# Patient Record
Sex: Female | Born: 1960 | Race: White | Hispanic: No | Smoking: Former smoker
Health system: Southern US, Community
[De-identification: ages and names within clinical notes are randomized; demographics above are authoritative.]

## PROBLEM LIST (undated history)

## (undated) DIAGNOSIS — F32A Depression, unspecified: Secondary | ICD-10-CM

## (undated) DIAGNOSIS — M810 Age-related osteoporosis without current pathological fracture: Secondary | ICD-10-CM

## (undated) DIAGNOSIS — E7800 Pure hypercholesterolemia, unspecified: Secondary | ICD-10-CM

## (undated) DIAGNOSIS — F329 Major depressive disorder, single episode, unspecified: Secondary | ICD-10-CM

## (undated) DIAGNOSIS — J449 Chronic obstructive pulmonary disease, unspecified: Secondary | ICD-10-CM

## (undated) DIAGNOSIS — J387 Other diseases of larynx: Secondary | ICD-10-CM

## (undated) DIAGNOSIS — J45909 Unspecified asthma, uncomplicated: Secondary | ICD-10-CM

## (undated) DIAGNOSIS — E079 Disorder of thyroid, unspecified: Secondary | ICD-10-CM

## (undated) HISTORY — DX: Depression, unspecified: F32.A

## (undated) HISTORY — PX: TUBAL LIGATION: SHX77

## (undated) HISTORY — DX: Other diseases of larynx: J38.7

## (undated) HISTORY — DX: Unspecified asthma, uncomplicated: J45.909

## (undated) HISTORY — DX: Age-related osteoporosis without current pathological fracture: M81.0

## (undated) HISTORY — DX: Chronic obstructive pulmonary disease, unspecified: J44.9

## (undated) HISTORY — DX: Major depressive disorder, single episode, unspecified: F32.9

---

## 2005-02-05 ENCOUNTER — Emergency Department: Payer: Self-pay | Admitting: Emergency Medicine

## 2005-09-26 ENCOUNTER — Emergency Department: Payer: Self-pay | Admitting: Emergency Medicine

## 2007-02-26 ENCOUNTER — Emergency Department: Payer: Self-pay | Admitting: Unknown Physician Specialty

## 2008-09-26 ENCOUNTER — Ambulatory Visit: Payer: Self-pay | Admitting: Urology

## 2011-06-15 ENCOUNTER — Emergency Department: Payer: Self-pay | Admitting: Emergency Medicine

## 2012-02-01 ENCOUNTER — Emergency Department: Payer: Self-pay | Admitting: Emergency Medicine

## 2012-02-01 LAB — CBC
HCT: 38.2 % (ref 35.0–47.0)
HGB: 13 g/dL (ref 12.0–16.0)
MCH: 32.9 pg (ref 26.0–34.0)
MCHC: 34 g/dL (ref 32.0–36.0)
MCV: 97 fL (ref 80–100)
Platelet: 241 10*3/uL (ref 150–440)
RBC: 3.94 10*6/uL (ref 3.80–5.20)
RDW: 13.5 % (ref 11.5–14.5)
WBC: 6.4 10*3/uL (ref 3.6–11.0)

## 2012-02-01 LAB — URIC ACID: Uric Acid: 2.9 mg/dL (ref 2.6–6.0)

## 2014-02-14 ENCOUNTER — Emergency Department: Payer: Self-pay | Admitting: Emergency Medicine

## 2014-02-14 LAB — COMPREHENSIVE METABOLIC PANEL
Albumin: 4.1 g/dL (ref 3.4–5.0)
Alkaline Phosphatase: 45 U/L
Anion Gap: 6 — ABNORMAL LOW (ref 7–16)
BUN: 16 mg/dL (ref 7–18)
Bilirubin,Total: 0.3 mg/dL (ref 0.2–1.0)
Calcium, Total: 8.7 mg/dL (ref 8.5–10.1)
Chloride: 107 mmol/L (ref 98–107)
Co2: 26 mmol/L (ref 21–32)
Creatinine: 1.15 mg/dL (ref 0.60–1.30)
EGFR (African American): >60
EGFR (Non-African Amer.): 55 — ABNORMAL LOW
Glucose: 79 mg/dL (ref 65–99)
Osmolality: 278 (ref 275–301)
Potassium: 4 mmol/L (ref 3.5–5.1)
SGOT(AST): 19 U/L (ref 15–37)
SGPT (ALT): 29 U/L (ref 12–78)
Sodium: 139 mmol/L (ref 136–145)
Total Protein: 7.6 g/dL (ref 6.4–8.2)

## 2014-02-14 LAB — CBC WITH DIFFERENTIAL/PLATELET
Basophil #: 0 10*3/uL (ref 0.0–0.1)
Basophil %: 0.6 %
Eosinophil #: 0.1 10*3/uL (ref 0.0–0.7)
Eosinophil %: 2.8 %
HCT: 38.9 % (ref 35.0–47.0)
HGB: 13.1 g/dL (ref 12.0–16.0)
Lymphocyte #: 0.9 10*3/uL — ABNORMAL LOW (ref 1.0–3.6)
Lymphocyte %: 21.6 %
MCH: 32.6 pg (ref 26.0–34.0)
MCHC: 33.7 g/dL (ref 32.0–36.0)
MCV: 97 fL (ref 80–100)
Monocyte #: 0.3 x10 3/mm (ref 0.2–0.9)
Monocyte %: 6.3 %
Neutrophil #: 2.9 10*3/uL (ref 1.4–6.5)
Neutrophil %: 68.7 %
Platelet: 227 10*3/uL (ref 150–440)
RBC: 4.03 10*6/uL (ref 3.80–5.20)
RDW: 13.2 % (ref 11.5–14.5)
WBC: 4.2 10*3/uL (ref 3.6–11.0)

## 2014-02-14 LAB — PROTIME-INR
INR: 1
Prothrombin Time: 13.4 secs (ref 11.5–14.7)

## 2014-02-14 LAB — APTT: Activated PTT: 28.9 secs (ref 23.6–35.9)

## 2014-04-29 ENCOUNTER — Emergency Department: Payer: Self-pay | Admitting: Emergency Medicine

## 2015-05-22 ENCOUNTER — Other Ambulatory Visit: Payer: Self-pay | Admitting: Unknown Physician Specialty

## 2015-05-22 DIAGNOSIS — Z1231 Encounter for screening mammogram for malignant neoplasm of breast: Secondary | ICD-10-CM

## 2015-06-11 ENCOUNTER — Ambulatory Visit (INDEPENDENT_AMBULATORY_CARE_PROVIDER_SITE_OTHER): Payer: BLUE CROSS/BLUE SHIELD | Admitting: Unknown Physician Specialty

## 2015-06-11 ENCOUNTER — Encounter: Payer: Self-pay | Admitting: Unknown Physician Specialty

## 2015-06-11 VITALS — BP 134/87 | HR 82 | Temp 98.6°F | Ht 66.5 in | Wt 156.0 lb

## 2015-06-11 DIAGNOSIS — G4700 Insomnia, unspecified: Secondary | ICD-10-CM

## 2015-06-11 DIAGNOSIS — F32A Depression, unspecified: Secondary | ICD-10-CM | POA: Insufficient documentation

## 2015-06-11 DIAGNOSIS — E063 Autoimmune thyroiditis: Secondary | ICD-10-CM | POA: Insufficient documentation

## 2015-06-11 DIAGNOSIS — E785 Hyperlipidemia, unspecified: Secondary | ICD-10-CM

## 2015-06-11 DIAGNOSIS — J4541 Moderate persistent asthma with (acute) exacerbation: Secondary | ICD-10-CM

## 2015-06-11 DIAGNOSIS — J449 Chronic obstructive pulmonary disease, unspecified: Secondary | ICD-10-CM | POA: Insufficient documentation

## 2015-06-11 DIAGNOSIS — M8580 Other specified disorders of bone density and structure, unspecified site: Secondary | ICD-10-CM

## 2015-06-11 DIAGNOSIS — F419 Anxiety disorder, unspecified: Secondary | ICD-10-CM

## 2015-06-11 DIAGNOSIS — F329 Major depressive disorder, single episode, unspecified: Secondary | ICD-10-CM | POA: Insufficient documentation

## 2015-06-11 DIAGNOSIS — J45909 Unspecified asthma, uncomplicated: Secondary | ICD-10-CM

## 2015-06-11 DIAGNOSIS — R05 Cough: Secondary | ICD-10-CM | POA: Diagnosis not present

## 2015-06-11 DIAGNOSIS — R059 Cough, unspecified: Secondary | ICD-10-CM

## 2015-06-11 DIAGNOSIS — M7070 Other bursitis of hip, unspecified hip: Secondary | ICD-10-CM

## 2015-06-11 DIAGNOSIS — E039 Hypothyroidism, unspecified: Secondary | ICD-10-CM

## 2015-06-11 DIAGNOSIS — E782 Mixed hyperlipidemia: Secondary | ICD-10-CM | POA: Insufficient documentation

## 2015-06-11 DIAGNOSIS — E7800 Pure hypercholesterolemia, unspecified: Secondary | ICD-10-CM

## 2015-06-11 DIAGNOSIS — G8929 Other chronic pain: Secondary | ICD-10-CM

## 2015-06-11 MED ORDER — LEVALBUTEROL HCL 0.63 MG/3ML IN NEBU: 0.6300 mg | INHALATION_SOLUTION | Freq: Once | RESPIRATORY_TRACT | Status: DC

## 2015-06-11 MED ORDER — BUDESONIDE-FORMOTEROL FUMARATE 160-4.5 MCG/ACT IN AERO: 2.0000 | INHALATION_SPRAY | Freq: Two times a day (BID) | RESPIRATORY_TRACT | Status: DC

## 2015-06-11 MED ORDER — HYDROCOD POLST-CPM POLST ER 10-8 MG/5ML PO SUER: 5.0000 mL | Freq: Two times a day (BID) | ORAL | Status: DC | PRN

## 2015-06-11 MED ORDER — PREDNISONE 20 MG PO TABS: ORAL_TABLET | ORAL | Status: DC

## 2015-06-11 MED ORDER — AZITHROMYCIN 250 MG PO TABS: ORAL_TABLET | ORAL | Status: DC

## 2015-06-11 NOTE — Patient Instructions (Signed)

## 2015-06-11 NOTE — Progress Notes (Signed)
BP 134/87 mmHg  Pulse 82  Temp(Src) 98.6 F (37 C)  Ht 5' 6.5" (1.689 m)  Wt 156 lb (70.761 kg)  BMI 24.80 kg/m2  SpO2 95%  LMP  (LMP Unknown)   Subjective:    Patient ID: Audrey Richard, female    DOB: [birth year], 54 y.o.   MRN: [id number]  HPI: Audrey Richard is a 54 y.o. female  Chief Complaint  Patient presents with  . Cough  . Chest Pain  . Headache    pt states it feels like her head is going to explode when she coughs  . Heartburn    pt states feels like her throat and esophagus is raw  . Dizziness   Cough This is a new problem. The current episode started in the past 7 days. The problem has been gradually worsening. The problem occurs constantly. The cough is non-productive. Associated symptoms include chest pain, headaches and heartburn. Nothing aggravates the symptoms. She has tried a beta-agonist inhaler for the symptoms. The treatment provided no relief. Her past medical history is significant for asthma.  Chest Pain  Associated symptoms include a cough, dizziness and headaches.  Headache  Associated symptoms include coughing and dizziness.  Heartburn She complains of chest pain, coughing and heartburn.  Dizziness Associated symptoms include chest pain, coughing and headaches.    Relevant past medical, surgical, family and social history reviewed and updated as indicated. Interim medical history since our last visit reviewed. Allergies and medications reviewed and updated.  Review of Systems  Respiratory: Positive for cough.   Cardiovascular: Positive for chest pain.  Gastrointestinal: Positive for heartburn.  Neurological: Positive for dizziness and headaches.    Per HPI unless specifically indicated above     Objective:    BP 134/87 mmHg  Pulse 82  Temp(Src) 98.6 F (37 C)  Ht 5' 6.5" (1.689 m)  Wt 156 lb (70.761 kg)  BMI 24.80 kg/m2  SpO2 95%  LMP  (LMP Unknown)  Wt Readings from Last 3 Encounters:  06/11/15 156 lb (70.761 kg)   05/18/15 155 lb (70.308 kg)    Physical Exam  Constitutional: She is oriented to person, place, and time. She appears well-developed and well-nourished. No distress.  HENT:  Head: Normocephalic and atraumatic.  Eyes: Conjunctivae and lids are normal. Right eye exhibits no discharge. Left eye exhibits no discharge. No scleral icterus.  Cardiovascular: Normal rate and regular rhythm.   Pulmonary/Chest: Effort normal. No respiratory distress. She has decreased breath sounds in the right lower field and the left lower field. She has rhonchi in the left lower field.  Abdominal: Normal appearance. There is no splenomegaly or hepatomegaly.  Musculoskeletal: Normal range of motion.  Neurological: She is alert and oriented to person, place, and time.  Skin: Skin is intact. No rash noted. No pallor.  Psychiatric: She has a normal mood and affect. Her behavior is normal. Judgment and thought content normal.     Assessment & Plan:   Problem List Items Addressed This Visit    None    Visit Diagnoses    Cough    -  Primary    Relevant Orders    Influenza A+B Ag, EIA    Asthma with acute exacerbation, moderate persistent        Lungs improved after nebulizer.      Relevant Medications    azithromycin (ZITHROMAX Z-PAK) 250 MG tablet    predniSONE (DELTASONE) 20 MG tablet    chlorpheniramine-HYDROcodone (TUSSIONEX PENNKINETIC ER)  10-8 MG/5ML SUER    budesonide-formoterol (SYMBICORT) 160-4.5 MCG/ACT inhaler         Follow up plan: Return if symptoms worsen or fail to improve.

## 2015-06-12 LAB — INFLUENZA A+B AG, EIA
Influenza A Ag, EIA: NEGATIVE
Influenza B Ag, EIA: NEGATIVE

## 2015-06-12 LAB — PLEASE NOTE:

## 2015-11-16 ENCOUNTER — Encounter: Payer: Self-pay | Admitting: Emergency Medicine

## 2015-11-16 ENCOUNTER — Emergency Department: Payer: BLUE CROSS/BLUE SHIELD

## 2015-11-16 ENCOUNTER — Emergency Department
Admission: EM | Admit: 2015-11-16 | Discharge: 2015-11-16 | Disposition: A | Discharge status: Home / Self Care | Payer: BLUE CROSS/BLUE SHIELD | Attending: Emergency Medicine | Admitting: Emergency Medicine

## 2015-11-16 DIAGNOSIS — Z79899 Other long term (current) drug therapy: Secondary | ICD-10-CM | POA: Insufficient documentation

## 2015-11-16 DIAGNOSIS — J45901 Unspecified asthma with (acute) exacerbation: Secondary | ICD-10-CM | POA: Diagnosis not present

## 2015-11-16 DIAGNOSIS — J40 Bronchitis, not specified as acute or chronic: Secondary | ICD-10-CM

## 2015-11-16 DIAGNOSIS — R05 Cough: Secondary | ICD-10-CM | POA: Diagnosis present

## 2015-11-16 DIAGNOSIS — Z7951 Long term (current) use of inhaled steroids: Secondary | ICD-10-CM | POA: Diagnosis not present

## 2015-11-16 DIAGNOSIS — Z87891 Personal history of nicotine dependence: Secondary | ICD-10-CM | POA: Diagnosis not present

## 2015-11-16 DIAGNOSIS — Z7952 Long term (current) use of systemic steroids: Secondary | ICD-10-CM | POA: Diagnosis not present

## 2015-11-16 HISTORY — DX: Disorder of thyroid, unspecified: E07.9

## 2015-11-16 HISTORY — DX: Pure hypercholesterolemia, unspecified: E78.00

## 2015-11-16 MED ORDER — ALBUTEROL SULFATE HFA 108 (90 BASE) MCG/ACT IN AERS: 2.0000 | INHALATION_SPRAY | Freq: Four times a day (QID) | RESPIRATORY_TRACT | Status: DC | PRN

## 2015-11-16 MED ORDER — IPRATROPIUM-ALBUTEROL 0.5-2.5 (3) MG/3ML IN SOLN
3.0000 mL | Freq: Once | RESPIRATORY_TRACT | Status: AC
  Administered 2015-11-16: 3 mL via RESPIRATORY_TRACT

## 2015-11-16 MED ORDER — AZITHROMYCIN 250 MG PO TABS: ORAL_TABLET | ORAL | Status: AC

## 2015-11-16 MED ORDER — DEXAMETHASONE SODIUM PHOSPHATE 10 MG/ML IJ SOLN
10.0000 mg | Freq: Once | INTRAMUSCULAR | Status: AC
  Administered 2015-11-16: 10 mg via INTRAMUSCULAR

## 2015-11-16 MED ORDER — IPRATROPIUM-ALBUTEROL 0.5-2.5 (3) MG/3ML IN SOLN
RESPIRATORY_TRACT | Status: AC
  Administered 2015-11-16: 3 mL via RESPIRATORY_TRACT
  Filled 2015-11-16: qty 3

## 2015-11-16 MED ORDER — DEXAMETHASONE SODIUM PHOSPHATE 10 MG/ML IJ SOLN
INTRAMUSCULAR | Status: AC
  Filled 2015-11-16: qty 1

## 2015-11-16 NOTE — ED Provider Notes (Signed)
[facility] Emergency Department Provider Note  ____________________________________________  Time seen: On arrival  I have reviewed the triage vital signs and the nursing notes.   HISTORY  Chief Complaint Cough and Nasal Congestion    HPI Audrey Richard is a 54 y.o. female with a history of asthma who presents with a cough for approximately one month. She reports her cough is so significant that she is having soreness in her ribs and difficult to sleeping. She denies recent travel, no fevers no chills. She does feel mildly short of breath. She is taking cough syrup but has not provided any relief. Cough is nonproductive. No pleurisy    Past Medical History  Diagnosis Date  . Osteoporosis   . Asthma   . Depression   . Thyroid disease   . High cholesterol     Patient Active Problem List   Diagnosis Date Noted  . Osteopenia 06/11/2015  . Depression 06/11/2015  . Insomnia 06/11/2015  . Hip bursitis 06/11/2015  . Chronic pain 06/11/2015  . Asthma 06/11/2015  . Hypothyroidism 06/11/2015  . Hyperlipidemia 06/11/2015  . Acute anxiety 06/11/2015  . Hypercholesterolemia 06/11/2015    Past Surgical History  Procedure Laterality Date  . Tubal ligation      Current Outpatient Rx  Name  Route  Sig  Dispense  Refill  . albuterol (PROVENTIL HFA;VENTOLIN HFA) 108 (90 BASE) MCG/ACT inhaler   Inhalation   Inhale into the lungs every 6 (six) hours as needed for wheezing or shortness of breath.         Marland Kitchen azithromycin (ZITHROMAX Z-PAK) 250 MG tablet      Z pack.  Take as directed   6 each   0   . budesonide-formoterol (SYMBICORT) 160-4.5 MCG/ACT inhaler   Inhalation   Inhale 2 puffs into the lungs 2 (two) times daily.   1 Inhaler   0   . buPROPion (WELLBUTRIN XL) 150 MG 24 hr tablet   Oral   Take 150 mg by mouth daily.         . chlorpheniramine-HYDROcodone (TUSSIONEX PENNKINETIC ER) 10-8 MG/5ML SUER   Oral   Take 5 mLs by mouth every  12 (twelve) hours as needed for cough.   140 mL   0   . FLUoxetine (PROZAC) 20 MG capsule   Oral   Take 20 mg by mouth daily.         . Fluticasone-Salmeterol (ADVAIR) 250-50 MCG/DOSE AEPB   Inhalation   Inhale 1 puff into the lungs 2 (two) times daily.         Marland Kitchen levothyroxine (SYNTHROID, LEVOTHROID) 100 MCG tablet   Oral   Take 100 mcg by mouth daily before breakfast.         . lovastatin (MEVACOR) 20 MG tablet   Oral   Take 20 mg by mouth at bedtime.         . montelukast (SINGULAIR) 10 MG tablet   Oral   Take 10 mg by mouth at bedtime.         . predniSONE (DELTASONE) 20 MG tablet      Take 2 pills daily with a meal   10 tablet   0   . traMADol (ULTRAM) 50 MG tablet   Oral   Take by mouth every 6 (six) hours as needed.         . traZODone (DESYREL) 50 MG tablet   Oral   Take 50 mg by mouth at bedtime.  Allergies Biaxin and Iodine  Family History  Problem Relation Age of Onset  . Heart disease Mother   . Heart disease Father   . Diabetes Sister   . Diabetes Maternal Grandmother   . Heart disease Maternal Grandmother   . Diabetes Maternal Grandfather   . Heart disease Maternal Grandfather     Social History Social History  Substance Use Topics  . Smoking status: Former Smoker    Quit date: 11/15/2005  . Smokeless tobacco: Never Used  . Alcohol Use: Yes     Comment: pt states on occasion    Review of Systems  Constitutional: Negative for fever. Eyes: Negative for blurry vision ENT: Negative for sore throat Respiratory: Positive for cough   Musculoskeletal: Negative for back pain. Skin: Negative for rash. Neurological: Negative for headaches   ____________________________________________   PHYSICAL EXAM:  VITAL SIGNS: ED Triage Vitals  Enc Vitals Group     BP 11/16/15 1742 135/71 mmHg     Pulse Rate 11/16/15 1742 74     Resp 11/16/15 1742 20     Temp 11/16/15 1742 98.4 F (36.9 C)     Temp Source 11/16/15  1742 Oral     SpO2 11/16/15 1742 95 %     Weight 11/16/15 1742 160 lb (72.576 kg)     Height 11/16/15 1742 5\' 7"  (1.702 m)     Head Cir --      Peak Flow --      Pain Score 11/16/15 1744 10     Pain Loc --      Pain Edu? --      Excl. in GC? --      Constitutional: Alert and oriented. Well appearing and in no distress. Eyes: Conjunctivae are normal.  ENT   Head: Normocephalic and atraumatic.   Mouth/Throat: Mucous membranes are moist. Cardiovascular: Normal rate, regular rhythm.  Respiratory: Normal respiratory effort without tachypnea nor retractions. Scattered wheezes noted Gastrointestinal: Soft and non-tender in all quadrants. No distention. There is no CVA tenderness. Musculoskeletal: Nontender with normal range of motion in all extremities. Neurologic:  Normal speech and language. No gross focal neurologic deficits are appreciated. Skin:  Skin is warm, dry and intact. No rash noted. Psychiatric: Mood and affect are normal. Patient exhibits appropriate insight and judgment.  ____________________________________________    LABS (pertinent positives/negatives)  Labs Reviewed - No data to display  ____________________________________________     ____________________________________________    RADIOLOGY I have personally reviewed any xrays that were ordered on this patient: Chest x-ray unremarkable  ____________________________________________   PROCEDURES  Procedure(s) performed: none   ____________________________________________   INITIAL IMPRESSION / ASSESSMENT AND PLAN / ED COURSE  Pertinent labs & imaging results that were available during my care of the patient were reviewed by me and considered in my medical decision making (see chart for details).  Patient with cough 1 week, afebrile, suspect bronchitis. We will give DuoNeb, check chest x-ray reevaluate  Patient feeling better after DuoNeb. X-ray unremarkable, we will discharge with  albuterol refill  Good Rx coupon provided ____________________________________________   FINAL CLINICAL IMPRESSION(S) / ED DIAGNOSES  Final diagnoses:  Bronchitis     Jene Everyobert Kamari Bilek, MD 11/16/15 1919

## 2015-11-16 NOTE — ED Notes (Signed)
Patient presents to the ED with cough and congestion x 1 month. Patient states her ribs are very sore from so much coughing.  Patient reports history of asthma and states the asthma is making her cough and congestion worse.  Patient has a dry cough during triage.  Patient is speaking in full sentences without difficulty at this time.  Patient reports taking cough syrup for her cough without much relief.

## 2015-11-16 NOTE — ED Notes (Signed)
Pt states she has been having cold sxs for the past month and now having pain in her ribs and in her lungs.  Pt states her cough is non-productive, but feels like something could come up.  Pt used to use Ventolin inhaler but does not have any more left. Has hx of Asthma, non-smoker.

## 2015-11-16 NOTE — Discharge Instructions (Signed)

## 2015-12-20 ENCOUNTER — Emergency Department: Payer: BLUE CROSS/BLUE SHIELD

## 2015-12-20 ENCOUNTER — Emergency Department
Admission: EM | Admit: 2015-12-20 | Discharge: 2015-12-20 | Disposition: A | Discharge status: Home / Self Care | Payer: BLUE CROSS/BLUE SHIELD | Attending: Emergency Medicine | Admitting: Emergency Medicine

## 2015-12-20 DIAGNOSIS — J410 Simple chronic bronchitis: Secondary | ICD-10-CM | POA: Diagnosis not present

## 2015-12-20 DIAGNOSIS — Z79899 Other long term (current) drug therapy: Secondary | ICD-10-CM | POA: Insufficient documentation

## 2015-12-20 DIAGNOSIS — Z7951 Long term (current) use of inhaled steroids: Secondary | ICD-10-CM | POA: Diagnosis not present

## 2015-12-20 DIAGNOSIS — J45909 Unspecified asthma, uncomplicated: Secondary | ICD-10-CM | POA: Insufficient documentation

## 2015-12-20 DIAGNOSIS — Z87891 Personal history of nicotine dependence: Secondary | ICD-10-CM | POA: Diagnosis not present

## 2015-12-20 DIAGNOSIS — R0781 Pleurodynia: Secondary | ICD-10-CM | POA: Diagnosis present

## 2015-12-20 DIAGNOSIS — Z7952 Long term (current) use of systemic steroids: Secondary | ICD-10-CM | POA: Diagnosis not present

## 2015-12-20 MED ORDER — HYDROCOD POLST-CPM POLST ER 10-8 MG/5ML PO SUER
ORAL | Status: AC
  Administered 2015-12-20: 5 mL via ORAL
  Filled 2015-12-20: qty 5

## 2015-12-20 MED ORDER — PREDNISONE 10 MG PO TABS: ORAL_TABLET | ORAL | Status: DC

## 2015-12-20 MED ORDER — HYDROCOD POLST-CPM POLST ER 10-8 MG/5ML PO SUER: 5.0000 mL | Freq: Two times a day (BID) | ORAL | Status: DC

## 2015-12-20 MED ORDER — HYDROCOD POLST-CPM POLST ER 10-8 MG/5ML PO SUER
5.0000 mL | Freq: Once | ORAL | Status: AC
Start: 2015-12-20 — End: 2015-12-20
  Administered 2015-12-20: 5 mL via ORAL

## 2015-12-20 NOTE — Discharge Instructions (Signed)
Call and make an appointment with Audrey Richard. Continue using her inhaler as needed. Tussionex 1 teaspoon twice a day as needed for cough. Prednisone 6 day taper beginning today. Tylenol if needed for rib pain. You're cough medication and has a narcotic in it so no other narcotic is needed at this time. Continue using your pillow against her ribs for support when taking deep breaths or coughing.

## 2015-12-20 NOTE — ED Notes (Signed)
States she felt a pop to right rib area after coughing this am .The patient has a history of of same in past

## 2015-12-20 NOTE — ED Notes (Signed)
Pt c/o right rib pain after coughing this morning. States she has broken a rib coughing before..Marland Kitchen

## 2015-12-20 NOTE — ED Provider Notes (Signed)
[facility] Emergency Department Provider Note ___________________________________________  Time seen: Approximately 7:35 AM  I have reviewed the triage vital signs and the nursing notes.   HISTORY  Chief Complaint Rib Injury  HPI Audrey Richard is a 54 y.o. female who comes to the emergency room with complaint of right lateral rib pain after coughing this morning. Patient states that in the past she has had a history of having a broken ribs after coughing and this feels similar. Patient states she was seen in the emergency room in November and placed on a Z-Pak and an inhaler. Patient states she has never gotten any better and continues to cough. She states that since her last visit to the emergency room she has not seen her PCP Charlean Sanfilippo for her continued medical problem. She denies any fever at home. She deniessmoking or ever been a smoker however her social history reflex that she was a former smoker and quit in 2006. Patient currently is being treated for asthma with inhaler and patient states she used it 5 minutes prior to her arrival in the emergency room. Currently she rates her pain a 10 out of 10.   Past Medical History  Diagnosis Date  . Osteoporosis   . Asthma   . Depression   . Thyroid disease   . High cholesterol     Patient Active Problem List   Diagnosis Date Noted  . Osteopenia 06/11/2015  . Depression 06/11/2015  . Insomnia 06/11/2015  . Hip bursitis 06/11/2015  . Chronic pain 06/11/2015  . Asthma 06/11/2015  . Hypothyroidism 06/11/2015  . Hyperlipidemia 06/11/2015  . Acute anxiety 06/11/2015  . Hypercholesterolemia 06/11/2015    Past Surgical History  Procedure Laterality Date  . Tubal ligation      Current Outpatient Rx  Name  Route  Sig  Dispense  Refill  . albuterol (PROVENTIL HFA;VENTOLIN HFA) 108 (90 BASE) MCG/ACT inhaler   Inhalation   Inhale 2 puffs into the lungs every 6 (six) hours as needed for wheezing or  shortness of breath.   1 Inhaler   2   . budesonide-formoterol (SYMBICORT) 160-4.5 MCG/ACT inhaler   Inhalation   Inhale 2 puffs into the lungs 2 (two) times daily.   1 Inhaler   0   . buPROPion (WELLBUTRIN XL) 150 MG 24 hr tablet   Oral   Take 150 mg by mouth daily.         . chlorpheniramine-HYDROcodone (TUSSIONEX PENNKINETIC ER) 10-8 MG/5ML SUER   Oral   Take 5 mLs by mouth 2 (two) times daily.   115 mL   0   . FLUoxetine (PROZAC) 20 MG capsule   Oral   Take 20 mg by mouth daily.         . Fluticasone-Salmeterol (ADVAIR) 250-50 MCG/DOSE AEPB   Inhalation   Inhale 1 puff into the lungs 2 (two) times daily.         Marland Kitchen levothyroxine (SYNTHROID, LEVOTHROID) 100 MCG tablet   Oral   Take 100 mcg by mouth daily before breakfast.         . lovastatin (MEVACOR) 20 MG tablet   Oral   Take 20 mg by mouth at bedtime.         . montelukast (SINGULAIR) 10 MG tablet   Oral   Take 10 mg by mouth at bedtime.         . predniSONE (DELTASONE) 10 MG tablet      Take 6 tablets  today, on day 2 take 5 tablets, day 3 take 4 tablets, day 4 take 3 tablets, day 5 take  2 tablets and 1 tablet the last day   21 tablet   0   . traMADol (ULTRAM) 50 MG tablet   Oral   Take by mouth every 6 (six) hours as needed.         . traZODone (DESYREL) 50 MG tablet   Oral   Take 50 mg by mouth at bedtime.           Allergies Biaxin and Iodine  Family History  Problem Relation Age of Onset  . Heart disease Mother   . Heart disease Father   . Diabetes Sister   . Diabetes Maternal Grandmother   . Heart disease Maternal Grandmother   . Diabetes Maternal Grandfather   . Heart disease Maternal Grandfather     Social History Social History  Substance Use Topics  . Smoking status: Former Smoker    Quit date: 11/15/2005  . Smokeless tobacco: Never Used  . Alcohol Use: Yes     Comment: pt states on occasion    Review of Systems Constitutional: No fever/chills ENT: No  sore throat. Cardiovascular: Denies chest pain. Respiratory: Denies shortness of breath. Increased right rib pain with coughing. Gastrointestinal:   No nausea, no vomiting.   Genitourinary: Negative for dysuria. Musculoskeletal: Negative for back pain. Skin: Negative for rash. Neurological: Negative for headaches, focal weakness or numbness.  10-point ROS otherwise negative.  ____________________________________________   PHYSICAL EXAM:  VITAL SIGNS: ED Triage Vitals  Enc Vitals Group     BP 12/20/15 0724 123/68 mmHg     Pulse Rate 12/20/15 0724 84     Resp 12/20/15 0724 20     Temp 12/20/15 0724 97.7 F (36.5 C)     Temp Source 12/20/15 0724 Oral     SpO2 12/20/15 0724 97 %     Weight 12/20/15 0724 150 lb (68.04 kg)     Height 12/20/15 0724  (1.702 m)     Head Cir --      Peak Flow --      Pain Score 12/20/15 0724 10     Pain Loc --      Pain Edu? --      Excl. in GC? --     Constitutional: Alert and oriented. Well appearing and in no acute distress. Eyes: Conjunctivae are normal. PERRL. EOMI. Head: Atraumatic. Nose: Moderate congestion/no rhinnorhea. Mouth/Throat: Mucous membranes are moist.  Oropharynx non-erythematous. Neck: No stridor. Supple  Cardiovascular: Normal rate, regular rhythm. Grossly normal heart sounds.  Good peripheral circulation. Respiratory: Poor respiratory effort secondary to pain.  No retractions. Lungs are clear however patient is taking very shallow breaths due to pain on the right lateral rib cage. No gross deformity was noted. There is no ecchymosis or abrasions present. Gastrointestinal: Soft and nontender. No distention.  Musculoskeletal: No lower extremity tenderness nor edema.  No joint effusions. Neurologic:  Normal speech and language. No gross focal neurologic deficits are appreciated. No gait instability. Skin:  Skin is warm, dry and intact. No rash noted. Psychiatric: Mood and affect are normal. Speech and behavior are  normal.  ____________________________________________   LABS (all labs ordered are listed, but only abnormal results are displayed)  Labs Reviewed - No data to display   RADIOLOGY  Right rib x-ray per radiologist showed no acute fracture. No acute cardiopulmonary abnormality seen. I, Tommi Rumps, personally viewed and evaluated  these images (plain radiographs) as part of my medical decision making, as well as reviewing the written report by the radiologist. ____________________________________________   PROCEDURES  Procedure(s) performed: None  Critical Care performed: No  ____________________________________________   INITIAL IMPRESSION / ASSESSMENT AND PLAN / ED COURSE  Pertinent labs & imaging results that were available during my care of the patient were reviewed by me and considered in my medical decision making (see chart for details).  Patient was given a prescription for Tussionex to reduce cough and help with rib pain. She is also given a prescription for prednisone 60 mg taper. She is to follow-up with her doctor if any continued problems. She will also continue using her inhaler as needed. We also discussed taking deeper breaths rather than shallow and holding a pillow against her ribs. ____________________________________________   FINAL CLINICAL IMPRESSION(S) / ED DIAGNOSES  Final diagnoses:  Rib pain on right side  Bronchitis, chronic, simple [facility](HCC)      Tommi RumpsRhonda L Carlon Chaloux, PA-C 12/20/15 1352  Sharman CheekPhillip Stafford, MD 12/20/15 1550

## 2015-12-20 NOTE — ED Notes (Signed)
PO meds given for cough ..states she also coughed up some blood this am

## 2015-12-28 ENCOUNTER — Encounter: Payer: Self-pay | Admitting: Unknown Physician Specialty

## 2015-12-28 ENCOUNTER — Ambulatory Visit (INDEPENDENT_AMBULATORY_CARE_PROVIDER_SITE_OTHER): Payer: BLUE CROSS/BLUE SHIELD | Admitting: Unknown Physician Specialty

## 2015-12-28 VITALS — BP 115/77 | HR 82 | Temp 98.0°F | Ht 66.1 in | Wt 159.6 lb

## 2015-12-28 DIAGNOSIS — R0789 Other chest pain: Secondary | ICD-10-CM

## 2015-12-28 NOTE — Progress Notes (Signed)
   BP 115/77 mmHg  Pulse 82  Temp(Src) 98 F (36.7 C)  Ht 5' 6.1" (1.679 m)  Wt 159 lb 9.6 oz (72.394 kg)  BMI 25.68 kg/m2  SpO2 96%  LMP  (LMP Unknown)   Subjective:    Patient ID: Audrey Richard, female    DOB: Feb 10, 1961, 55 y.o.   MRN: [id number]  HPI: Audrey Richard is a 55 y.o. female  Chief Complaint  Patient presents with  . Follow-up    pt was in ER for broken ribs, cartilage and has FMLA paperwork to be filled out    Right lateral rib pain due to cough. I reviewed the ER notes and there is no fracture noted. Right lateral rib pain due to cough.  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  Got Prednisone and Tussionex at the ER.    Relevant past medical, surgical, family and social history reviewed and updated as indicated. Interim medical history since our last visit reviewed. Allergies and medications reviewed and updated.  Review of Systems  Per HPI unless specifically indicated above     Objective:    BP 115/77 mmHg  Pulse 82  Temp(Src) 98 F (36.7 C)  Ht 5' 6.1" (1.679 m)  Wt 159 lb 9.6 oz (72.394 kg)  BMI 25.68 kg/m2  SpO2 96%  LMP  (LMP Unknown)  Wt Readings from Last 3 Encounters:  12/28/15 159 lb 9.6 oz (72.394 kg)  12/20/15 150 lb (68.04 kg)  11/16/15 160 lb (72.576 kg)    Physical Exam  Constitutional: She is oriented to person, place, and time. She appears well-developed and well-nourished. No distress.  In pain  HENT:  Head: Normocephalic and atraumatic.  Eyes: Conjunctivae and lids are normal. Right eye exhibits no discharge. Left eye exhibits no discharge. No scleral icterus.  Cardiovascular: Normal rate.   Pulmonary/Chest: Effort normal. She has no decreased breath sounds. She has no wheezes. She has no rhonchi. She has no rales. She exhibits tenderness.  Very TTP right lateral chest wall   Abdominal: Normal appearance. There is no splenomegaly or  hepatomegaly.  Musculoskeletal: Normal range of motion.  Neurological: She is alert and oriented to person, place, and time.  Skin: Skin is intact. No rash noted. No pallor.  Psychiatric: She has a normal mood and affect. Her behavior is normal. Judgment and thought content normal.    Results for orders placed or performed in visit on 06/11/15  Influenza A+B Ag, EIA  Result Value Ref Range   Influenza A Ag, EIA Negative Negative   Influenza B Ag, EIA Negative Negative   Influenza Comment See note   Please note:  Result Value Ref Range   Please note: Comment       Assessment & Plan:   Problem List Items Addressed This Visit    None    Visit Diagnoses    Chest wall pain    -  Primary    Lateral chest wall pain secondary to cough.  FMLA papers filled out to be out until the 17th.  Already taking Prednisone and Tussionex.  Try a heating pad.          Follow up plan: Return in about 1 week (around 01/04/2016), or if symptoms worsen or fail to improve.

## 2016-01-07 ENCOUNTER — Ambulatory Visit (INDEPENDENT_AMBULATORY_CARE_PROVIDER_SITE_OTHER): Payer: BLUE CROSS/BLUE SHIELD | Admitting: Unknown Physician Specialty

## 2016-01-07 ENCOUNTER — Encounter: Payer: Self-pay | Admitting: Unknown Physician Specialty

## 2016-01-07 VITALS — BP 117/81 | HR 81 | Temp 98.3°F | Ht 65.7 in | Wt 167.0 lb

## 2016-01-07 DIAGNOSIS — R0789 Other chest pain: Secondary | ICD-10-CM | POA: Diagnosis not present

## 2016-01-07 NOTE — Patient Instructions (Signed)
Use Lidoderm patches.

## 2016-01-07 NOTE — Progress Notes (Signed)
o  BP 117/81 mmHg  Pulse 81  Temp(Src) 98.3 F (36.8 C)  Ht 5' 5.7" (1.669 m)  Wt 167 lb (75.751 kg)  BMI 27.19 kg/m2  SpO2 96%  LMP  (LMP Unknown)   Subjective:    Patient ID: Audrey Richard, female    DOB: 07-18-1961, 55 y.o.   MRN: [id number]  HPI: Audrey Richard is a 55 y.o. female  Chief Complaint  Patient presents with  . Pain    pt states she is here for f/u on pain in rib area. States she is still in a lot of pain.    Pt states she is "a little better."  She is not near ready to go to work and do lifting as she was told she needed to be 100%.   She is not coughing anymore.  No fever or SOB  Relevant past medical, surgical, family and social history reviewed and updated as indicated. Interim medical history since our last visit reviewed. Allergies and medications reviewed and updated.  Review of Systems  Per HPI unless specifically indicated above     Objective:    BP 117/81 mmHg  Pulse 81  Temp(Src) 98.3 F (36.8 C)  Ht 5' 5.7" (1.669 m)  Wt 167 lb (75.751 kg)  BMI 27.19 kg/m2  SpO2 96%  LMP  (LMP Unknown)  Wt Readings from Last 3 Encounters:  01/07/16 167 lb (75.751 kg)  12/28/15 159 lb 9.6 oz (72.394 kg)  12/20/15 150 lb (68.04 kg)    Physical Exam  Constitutional: She is oriented to person, place, and time. She appears well-developed and well-nourished. No distress.  HENT:  Head: Normocephalic and atraumatic.  Eyes: Conjunctivae and lids are normal. Right eye exhibits no discharge. Left eye exhibits no discharge. No scleral icterus.  Neck: Normal range of motion. Neck supple. No JVD present. Carotid bruit is not present.  Cardiovascular: Normal rate, regular rhythm and normal heart sounds.   Pulmonary/Chest: Effort normal. She has rales in the right lower field. She exhibits tenderness.  Tender right lateral rib cage.    Abdominal: Normal appearance. There is no splenomegaly or hepatomegaly.  Musculoskeletal: Normal range of motion.   Neurological: She is alert and oriented to person, place, and time.  Skin: Skin is warm, dry and intact. No rash noted. No pallor.  Psychiatric: She has a normal mood and affect. Her behavior is normal. Judgment and thought content normal.    Results for orders placed or performed in visit on 06/11/15  Influenza A+B Ag, EIA  Result Value Ref Range   Influenza A Ag, EIA Negative Negative   Influenza B Ag, EIA Negative Negative   Influenza Comment See note   Please note:  Result Value Ref Range   Please note: Comment       Assessment & Plan:   Problem List Items Addressed This Visit    None    Visit Diagnoses    Chest wall pain    -  Primary    continued chest wall pain.  ? crackles RLL.  Will get f/u chest x-ray.  Out of work for another week    Relevant Orders    DG Chest 2 View        Follow up plan: Return in about 1 week (around 01/14/2016).

## 2016-01-08 ENCOUNTER — Ambulatory Visit
Admission: RE | Admit: 2016-01-08 | Discharge: 2016-01-08 | Disposition: A | Discharge status: Home / Self Care | Payer: BLUE CROSS/BLUE SHIELD | Source: Ambulatory Visit | Attending: Unknown Physician Specialty | Admitting: Unknown Physician Specialty

## 2016-01-08 DIAGNOSIS — S2231XA Fracture of one rib, right side, initial encounter for closed fracture: Secondary | ICD-10-CM | POA: Diagnosis not present

## 2016-01-08 DIAGNOSIS — R0789 Other chest pain: Secondary | ICD-10-CM | POA: Diagnosis present

## 2016-01-08 DIAGNOSIS — X58XXXA Exposure to other specified factors, initial encounter: Secondary | ICD-10-CM | POA: Insufficient documentation

## 2016-01-10 ENCOUNTER — Encounter: Payer: Self-pay | Admitting: Unknown Physician Specialty

## 2016-01-14 ENCOUNTER — Ambulatory Visit (INDEPENDENT_AMBULATORY_CARE_PROVIDER_SITE_OTHER): Payer: BLUE CROSS/BLUE SHIELD | Admitting: Unknown Physician Specialty

## 2016-01-14 ENCOUNTER — Encounter: Payer: Self-pay | Admitting: Unknown Physician Specialty

## 2016-01-14 VITALS — BP 112/75 | HR 85 | Temp 98.4°F | Ht 65.7 in | Wt 164.6 lb

## 2016-01-14 DIAGNOSIS — E7800 Pure hypercholesterolemia, unspecified: Secondary | ICD-10-CM

## 2016-01-14 DIAGNOSIS — S2231XD Fracture of one rib, right side, subsequent encounter for fracture with routine healing: Secondary | ICD-10-CM | POA: Diagnosis not present

## 2016-01-14 DIAGNOSIS — E039 Hypothyroidism, unspecified: Secondary | ICD-10-CM

## 2016-01-14 MED ORDER — LOVASTATIN 20 MG PO TABS: 20.0000 mg | ORAL_TABLET | Freq: Every day | ORAL | Status: DC

## 2016-01-14 MED ORDER — BUPROPION HCL ER (XL) 150 MG PO TB24: 150.0000 mg | ORAL_TABLET | Freq: Every day | ORAL | Status: DC

## 2016-01-14 MED ORDER — LEVOTHYROXINE SODIUM 100 MCG PO TABS: 100.0000 ug | ORAL_TABLET | Freq: Every day | ORAL | Status: DC

## 2016-01-14 MED ORDER — FLUOXETINE HCL 20 MG PO CAPS: 20.0000 mg | ORAL_CAPSULE | Freq: Every day | ORAL | Status: DC

## 2016-01-14 NOTE — Progress Notes (Signed)
BP 112/75 mmHg  Pulse 85  Temp(Src) 98.4 F (36.9 C)  Ht 5' 5.7" (1.669 m)  Wt 164 lb 9.6 oz (74.662 kg)  BMI 26.80 kg/m2  SpO2 95%  LMP  (LMP Unknown)   Subjective:    Patient ID: Audrey Richard, female    DOB: [birth year], 55 y.o.   MRN: [id number]  HPI: Audrey Richard is a 55 y.o. female  Chief Complaint  Patient presents with  . Pain    pt is here for f/u on pain in rib cage area. states pain is a little better  . Medication Refill    pt states she needs refills on all medications   Hypothyroid: Feels this is stable.  No unexplained fatigue or weight loss.    Depression:Feels this is stable and no change Depression screen [facility] 2/9 01/14/2016  Decreased Interest 2  Down, Depressed, Hopeless 1  PHQ - 2 Score 3  Altered sleeping 0  Tired, decreased energy 2  Change in appetite 0  Feeling bad or failure about yourself  1  Trouble concentrating 0  Moving slowly or fidgety/restless 0  Suicidal thoughts 0  PHQ-9 Score 6    Hyperlipidemia Using medications without problems No Muscle aches  Diet compliance: Exercise: none  Rib pain Last x-rays reviewed showed fractured rib at the area of pain.  Still unable to work as she cannot lift.     Relevant past medical, surgical, family and social history reviewed and updated as indicated. Interim medical history since our last visit reviewed. Allergies and medications reviewed and updated.  Review of Systems  Per HPI unless specifically indicated above     Objective:    BP 112/75 mmHg  Pulse 85  Temp(Src) 98.4 F (36.9 C)  Ht 5' 5.7" (1.669 m)  Wt 164 lb 9.6 oz (74.662 kg)  BMI 26.80 kg/m2  SpO2 95%  LMP  (LMP Unknown)  Wt Readings from Last 3 Encounters:  01/14/16 164 lb 9.6 oz (74.662 kg)  01/07/16 167 lb (75.751 kg)  12/28/15 159 lb 9.6 oz (72.394 kg)    Physical Exam  Constitutional: She is oriented to person, place, and time. She appears well-developed and well-nourished. No distress.  HENT:   Head: Normocephalic and atraumatic.  Eyes: Conjunctivae and lids are normal. Right eye exhibits no discharge. Left eye exhibits no discharge. No scleral icterus.  Cardiovascular: Normal rate and regular rhythm.   Pulmonary/Chest: Effort normal and breath sounds normal.  Abdominal: Normal appearance. There is no splenomegaly or hepatomegaly.  Musculoskeletal: Normal range of motion.  Neurological: She is alert and oriented to person, place, and time.  Skin: Skin is warm, dry and intact. No rash noted. No pallor.  Psychiatric: She has a normal mood and affect. Her behavior is normal. Judgment and thought content normal.       Assessment & Plan:   Problem List Items Addressed This Visit      Unprioritized   Hypothyroidism    Check TSH      Relevant Orders   TSH   Hypercholesterolemia    Check lipid panel.  Refill Mevacor      Relevant Orders   Comprehensive metabolic panel   Lipid Panel w/o Chol/HDL Ratio    Other Visit Diagnoses    Rib fracture, right, with routine healing, subsequent encounter    -  Primary    Improved pain.  Willl continue to monitor.  Out of work for 2 weeks  Follow up plan: Return in about 2 weeks (around 01/28/2016).

## 2016-01-14 NOTE — Assessment & Plan Note (Signed)
Check lipid panel.  Refill Mevacor

## 2016-01-14 NOTE — Assessment & Plan Note (Signed)
Check TSH 

## 2016-01-15 LAB — LIPID PANEL W/O CHOL/HDL RATIO
Cholesterol, Total: 209 mg/dL — ABNORMAL HIGH (ref 100–199)
HDL: 54 mg/dL (ref >39)
LDL Calculated: 130 mg/dL — ABNORMAL HIGH (ref 0–99)
Triglycerides: 126 mg/dL (ref 0–149)
VLDL Cholesterol Cal: 25 mg/dL (ref 5–40)

## 2016-01-15 LAB — COMPREHENSIVE METABOLIC PANEL WITH GFR
ALT: 82 IU/L — ABNORMAL HIGH (ref 0–32)
AST: 30 IU/L (ref 0–40)
Albumin/Globulin Ratio: 1.8 (ref 1.1–2.5)
Albumin: 4 g/dL (ref 3.5–5.5)
Alkaline Phosphatase: 64 IU/L (ref 39–117)
BUN/Creatinine Ratio: 15 (ref 9–23)
BUN: 15 mg/dL (ref 6–24)
Bilirubin Total: 0.5 mg/dL (ref 0.0–1.2)
CO2: 25 mmol/L (ref 18–29)
Calcium: 8.8 mg/dL (ref 8.7–10.2)
Chloride: 103 mmol/L (ref 96–106)
Creatinine, Ser: 0.98 mg/dL (ref 0.57–1.00)
GFR calc Af Amer: 76 mL/min/1.73
GFR calc non Af Amer: 66 mL/min/1.73
Globulin, Total: 2.2 g/dL (ref 1.5–4.5)
Glucose: 73 mg/dL (ref 65–99)
Potassium: 4 mmol/L (ref 3.5–5.2)
Sodium: 142 mmol/L (ref 134–144)
Total Protein: 6.2 g/dL (ref 6.0–8.5)

## 2016-01-15 LAB — TSH: TSH: 5.14 u[IU]/mL — ABNORMAL HIGH (ref 0.450–4.500)

## 2016-01-17 ENCOUNTER — Telehealth: Payer: Self-pay | Admitting: Unknown Physician Specialty

## 2016-01-17 DIAGNOSIS — E039 Hypothyroidism, unspecified: Secondary | ICD-10-CM

## 2016-01-17 DIAGNOSIS — R7401 Elevation of levels of liver transaminase levels: Secondary | ICD-10-CM

## 2016-01-17 DIAGNOSIS — R740 Nonspecific elevation of levels of transaminase and lactic acid dehydrogenase [LDH]: Secondary | ICD-10-CM

## 2016-01-17 MED ORDER — LEVOTHYROXINE SODIUM 112 MCG PO TABS: 112.0000 ug | ORAL_TABLET | Freq: Every day | ORAL | Status: DC

## 2016-01-17 NOTE — Telephone Encounter (Signed)
Discussed with pt and talked about Elevated liver enzymes, high cholesterol and higher than normal TSH.  Will increase Synthroid from 100 to 112 mcgs.  Recheck TSH and CMP in 3 months

## 2016-01-28 ENCOUNTER — Encounter: Payer: Self-pay | Admitting: Unknown Physician Specialty

## 2016-01-28 ENCOUNTER — Ambulatory Visit (INDEPENDENT_AMBULATORY_CARE_PROVIDER_SITE_OTHER): Payer: BLUE CROSS/BLUE SHIELD | Admitting: Unknown Physician Specialty

## 2016-01-28 VITALS — BP 109/72 | HR 83 | Temp 98.1°F | Ht 65.8 in | Wt 169.4 lb

## 2016-01-28 DIAGNOSIS — J0100 Acute maxillary sinusitis, unspecified: Secondary | ICD-10-CM | POA: Diagnosis not present

## 2016-01-28 DIAGNOSIS — S2231XD Fracture of one rib, right side, subsequent encounter for fracture with routine healing: Secondary | ICD-10-CM

## 2016-01-28 MED ORDER — GUAIFENESIN-CODEINE 100-10 MG/5ML PO SOLN: 10.0000 mL | Freq: Three times a day (TID) | ORAL | Status: DC | PRN

## 2016-01-28 MED ORDER — AMOXICILLIN 875 MG PO TABS: 875.0000 mg | ORAL_TABLET | Freq: Two times a day (BID) | ORAL | Status: DC

## 2016-01-28 NOTE — Progress Notes (Signed)
BP 109/72 mmHg  Pulse 83  Temp(Src) 98.1 F (36.7 C)  Ht 5' 5.8" (1.671 m)  Wt 169 lb 6.4 oz (76.839 kg)  BMI 27.52 kg/m2  SpO2 95%  LMP  (LMP Unknown)   Subjective:    Patient ID: Audrey Richard, female    DOB: 08-19-1961, 55 y.o.   MRN: [id number]  HPI: Audrey Richard is a 55 y.o. female  Chief Complaint  Patient presents with  . Pain    pt states she is following up on pain, states she is feeling better  . URI    pt states she has some sinus pressure, drainage, cough and congestion. States symptoms started about a week ago  Pain/rib fracture: Patient has some soreness, but rib pain is much better. Worried that pain will worsen with current coughing. She is better but probably not able to go back to work as she needs to pick up heavy boxes.    URI: Headache, congestion, drainage in throat, cough, rhinorrhea and bloody kleenex when she blows nose. All symptoms started last week. Has taken Robitussin OTC which helped cough only. Hasn't been around any sick contacts.   Relevant past medical, surgical, family and social history reviewed and updated as indicated. Interim medical history since our last visit reviewed. Allergies and medications reviewed and updated.  Review of Systems  Constitutional: Negative.   HENT: Positive for congestion, rhinorrhea and sinus pressure.   Eyes: Negative.   Respiratory: Positive for cough.   Cardiovascular: Negative.   Gastrointestinal: Negative.   Endocrine: Negative.   Genitourinary: Negative.   Musculoskeletal: Negative.   Skin: Negative.   Allergic/Immunologic: Negative.   Neurological: Negative.   Hematological: Negative.   Psychiatric/Behavioral: Negative.     Per HPI unless specifically indicated above     Objective:    BP 109/72 mmHg  Pulse 83  Temp(Src) 98.1 F (36.7 C)  Ht 5' 5.8" (1.671 m)  Wt 169 lb 6.4 oz (76.839 kg)  BMI 27.52 kg/m2  SpO2 95%  LMP  (LMP Unknown)  Wt Readings from Last 3 Encounters:   01/28/16 169 lb 6.4 oz (76.839 kg)  01/14/16 164 lb 9.6 oz (74.662 kg)  01/07/16 167 lb (75.751 kg)    Physical Exam  Constitutional: She is oriented to person, place, and time. She appears well-developed and well-nourished. No distress.  HENT:  Head: Normocephalic and atraumatic.  Right Ear: Tympanic membrane and ear canal normal.  Left Ear: Tympanic membrane and ear canal normal.  Nose: Rhinorrhea present. Right sinus exhibits no maxillary sinus tenderness and no frontal sinus tenderness. Left sinus exhibits no maxillary sinus tenderness and no frontal sinus tenderness.  Mouth/Throat: Mucous membranes are normal. Posterior oropharyngeal erythema present.  Eyes: Conjunctivae and lids are normal. Right eye exhibits no discharge. Left eye exhibits no discharge. No scleral icterus.  Cardiovascular: Normal rate and regular rhythm.   Pulmonary/Chest: Effort normal and breath sounds normal. No respiratory distress.  Abdominal: Normal appearance. There is no splenomegaly or hepatomegaly.  Musculoskeletal: Normal range of motion.  Neurological: She is alert and oriented to person, place, and time.  Skin: Skin is intact. No rash noted. No pallor.  Psychiatric: She has a normal mood and affect. Her behavior is normal. Judgment and thought content normal.    Results for orders placed or performed in visit on 01/14/16  Comprehensive metabolic panel  Result Value Ref Range   Glucose 73 65 - 99 mg/dL   BUN 15 6 - 24 mg/dL  Creatinine, Ser 0.98 0.57 - 1.00 mg/dL   GFR calc non Af Amer 66 >59 mL/min/1.73   GFR calc Af Amer 76 >59 mL/min/1.73   BUN/Creatinine Ratio 15 9 - 23   Sodium 142 134 - 144 mmol/L   Potassium 4.0 3.5 - 5.2 mmol/L   Chloride 103 96 - 106 mmol/L   CO2 25 18 - 29 mmol/L   Calcium 8.8 8.7 - 10.2 mg/dL   Total Protein 6.2 6.0 - 8.5 g/dL   Albumin 4.0 3.5 - 5.5 g/dL   Globulin, Total 2.2 1.5 - 4.5 g/dL   Albumin/Globulin Ratio 1.8 1.1 - 2.5   Bilirubin Total 0.5 0.0 - 1.2  mg/dL   Alkaline Phosphatase 64 39 - 117 IU/L   AST 30 0 - 40 IU/L   ALT 82 (H) 0 - 32 IU/L  TSH  Result Value Ref Range   TSH 5.140 (H) 0.450 - 4.500 uIU/mL  Lipid Panel w/o Chol/HDL Ratio  Result Value Ref Range   Cholesterol, Total 209 (H) 100 - 199 mg/dL   Triglycerides 956 0 - 149 mg/dL   HDL 54 >21 mg/dL   VLDL Cholesterol Cal 25 5 - 40 mg/dL   LDL Calculated 308 (H) 0 - 99 mg/dL      Assessment & Plan:   Problem List Items Addressed This Visit    None    Visit Diagnoses    Acute maxillary sinusitis, recurrence not specified    -  Primary    Relevant Medications    amoxicillin (AMOXIL) 875 MG tablet    guaiFENesin-codeine 100-10 MG/5ML syrup    Rib fracture, right, with routine healing, subsequent encounter            Follow up plan: Return in about 2 weeks (around 02/11/2016).

## 2016-02-11 ENCOUNTER — Encounter: Payer: Self-pay | Admitting: Unknown Physician Specialty

## 2016-02-11 ENCOUNTER — Ambulatory Visit (INDEPENDENT_AMBULATORY_CARE_PROVIDER_SITE_OTHER): Payer: BLUE CROSS/BLUE SHIELD | Admitting: Unknown Physician Specialty

## 2016-02-11 VITALS — BP 145/79 | HR 92 | Temp 98.6°F | Ht 65.3 in | Wt 173.2 lb

## 2016-02-11 DIAGNOSIS — R0789 Other chest pain: Secondary | ICD-10-CM | POA: Diagnosis not present

## 2016-02-11 DIAGNOSIS — J301 Allergic rhinitis due to pollen: Secondary | ICD-10-CM | POA: Diagnosis not present

## 2016-02-11 DIAGNOSIS — H1013 Acute atopic conjunctivitis, bilateral: Secondary | ICD-10-CM | POA: Diagnosis not present

## 2016-02-11 MED ORDER — FLUTICASONE PROPIONATE 50 MCG/ACT NA SUSP: 2.0000 | Freq: Every day | NASAL | Status: DC

## 2016-02-11 MED ORDER — AZELASTINE HCL 0.05 % OP SOLN: 1.0000 [drp] | Freq: Two times a day (BID) | OPHTHALMIC | Status: DC

## 2016-02-11 NOTE — Progress Notes (Signed)
BP 145/79 mmHg  Pulse 92  Temp(Src) 98.6 F (37 C)  Ht 5' 5.3" (1.659 m)  Wt 173 lb 3.2 oz (78.563 kg)  BMI 28.54 kg/m2  SpO2 96%  LMP  (LMP Unknown)   Subjective:    Patient ID: Audrey Richard, female    DOB: 1961/11/11, 55 y.o.   MRN: [id number]  HPI: Audrey Richard is a 55 y.o. female  Chief Complaint  Patient presents with  . Pain    pt states she is here for 2 week f/u and states pain is getting better  . Itchy Eye    pt states both eyes are very itchy   Chest wall pain Pt is here to f/u on her chest pain secondary to rib fracture.  Pt states she is better and will be able to go to work in 2 weeks.    Allergies Nasal congestion and itchy eyes.  She is taking Loratadine which isn't helping.  Using Clear eyes for Allergies with only mild improvement.    Relevant past medical, surgical, family and social history reviewed and updated as indicated. Interim medical history since our last visit reviewed. Allergies and medications reviewed and updated.  Review of Systems  Per HPI unless specifically indicated above     Objective:    BP 145/79 mmHg  Pulse 92  Temp(Src) 98.6 F (37 C)  Ht 5' 5.3" (1.659 m)  Wt 173 lb 3.2 oz (78.563 kg)  BMI 28.54 kg/m2  SpO2 96%  LMP  (LMP Unknown)  Wt Readings from Last 3 Encounters:  02/11/16 173 lb 3.2 oz (78.563 kg)  01/28/16 169 lb 6.4 oz (76.839 kg)  01/14/16 164 lb 9.6 oz (74.662 kg)    Physical Exam  Constitutional: She is oriented to person, place, and time. She appears well-developed and well-nourished. No distress.  HENT:  Head: Normocephalic and atraumatic.  Right Ear: Tympanic membrane and ear canal normal.  Left Ear: Tympanic membrane and ear canal normal.  Nose: Rhinorrhea present. Right sinus exhibits no maxillary sinus tenderness and no frontal sinus tenderness. Left sinus exhibits no maxillary sinus tenderness and no frontal sinus tenderness.  Mouth/Throat: Mucous membranes are normal. Posterior  oropharyngeal erythema present.  Eyes: Conjunctivae and lids are normal. Right eye exhibits no discharge. Left eye exhibits no discharge. No scleral icterus.  Cardiovascular: Normal rate and regular rhythm.   Pulmonary/Chest: Effort normal and breath sounds normal. No respiratory distress.  Abdominal: Normal appearance. There is no splenomegaly or hepatomegaly.  Musculoskeletal: Normal range of motion.  Neurological: She is alert and oriented to person, place, and time.  Skin: Skin is intact. No rash noted. No pallor.  Psychiatric: She has a normal mood and affect. Her behavior is normal. Judgment and thought content normal.    Results for orders placed or performed in visit on 01/14/16  Comprehensive metabolic panel  Result Value Ref Range   Glucose 73 65 - 99 mg/dL   BUN 15 6 - 24 mg/dL   Creatinine, Ser 4.09 0.57 - 1.00 mg/dL   GFR calc non Af Amer 66 >59 mL/min/1.73   GFR calc Af Amer 76 >59 mL/min/1.73   BUN/Creatinine Ratio 15 9 - 23   Sodium 142 134 - 144 mmol/L   Potassium 4.0 3.5 - 5.2 mmol/L   Chloride 103 96 - 106 mmol/L   CO2 25 18 - 29 mmol/L   Calcium 8.8 8.7 - 10.2 mg/dL   Total Protein 6.2 6.0 - 8.5 g/dL   Albumin  4.0 3.5 - 5.5 g/dL   Globulin, Total 2.2 1.5 - 4.5 g/dL   Albumin/Globulin Ratio 1.8 1.1 - 2.5   Bilirubin Total 0.5 0.0 - 1.2 mg/dL   Alkaline Phosphatase 64 39 - 117 IU/L   AST 30 0 - 40 IU/L   ALT 82 (H) 0 - 32 IU/L  TSH  Result Value Ref Range   TSH 5.140 (H) 0.450 - 4.500 uIU/mL  Lipid Panel w/o Chol/HDL Ratio  Result Value Ref Range   Cholesterol, Total 209 (H) 100 - 199 mg/dL   Triglycerides 161 0 - 149 mg/dL   HDL 54 >09 mg/dL   VLDL Cholesterol Cal 25 5 - 40 mg/dL   LDL Calculated 604 (H) 0 - 99 mg/dL      Assessment & Plan:   Problem List Items Addressed This Visit    None    Visit Diagnoses    Allergic rhinitis due to pollen    -  Primary    rx for Flonase    Allergic conjunctivitis, bilateral        Rx for Optivar     Chest  wall pain        Due to rib fracture.  Improving.  Back to work in 2 weeks        Follow up plan: Return if symptoms worsen or fail to improve, for recheck in July.

## 2016-02-25 ENCOUNTER — Telehealth: Payer: Self-pay | Admitting: Unknown Physician Specialty

## 2016-02-25 ENCOUNTER — Encounter: Payer: Self-pay | Admitting: Unknown Physician Specialty

## 2016-02-25 NOTE — Telephone Encounter (Signed)
Routing to provider  

## 2016-02-25 NOTE — Telephone Encounter (Signed)
I will handle it. Thanks Elnita MaxwellCheryl!

## 2016-02-25 NOTE — Telephone Encounter (Signed)
Yes it can.  Are you comfortable writing the note?

## 2016-02-25 NOTE — Telephone Encounter (Signed)
Pt came in thought she had an appt for return to work clearance, researched found note stated pt could return to work today pt thought she was supposed to return tomorrow, wants to know if note can be extended through tomorrow so she doesn't lose her job. Thanks.

## 2016-05-14 ENCOUNTER — Other Ambulatory Visit: Payer: BLUE CROSS/BLUE SHIELD

## 2016-05-15 ENCOUNTER — Telehealth: Payer: Self-pay

## 2016-05-15 ENCOUNTER — Other Ambulatory Visit: Payer: BLUE CROSS/BLUE SHIELD

## 2016-05-15 DIAGNOSIS — R740 Nonspecific elevation of levels of transaminase and lactic acid dehydrogenase [LDH]: Principal | ICD-10-CM

## 2016-05-15 DIAGNOSIS — R7401 Elevation of levels of liver transaminase levels: Secondary | ICD-10-CM

## 2016-05-15 DIAGNOSIS — E039 Hypothyroidism, unspecified: Secondary | ICD-10-CM

## 2016-05-15 NOTE — Telephone Encounter (Signed)
Patient came in for lab work and asked for a sample of ProAir inhaler.She stated that she could not afford the inhaler at the pharmacy. I advised her that we did not have any samples and all we had were preventative type inhalers. She asked if she could have one of those. I advised her I'd have to check with you first since we didn't even have ProAir on her current med list.

## 2016-05-16 ENCOUNTER — Other Ambulatory Visit: Payer: Self-pay | Admitting: Unknown Physician Specialty

## 2016-05-16 LAB — COMPREHENSIVE METABOLIC PANEL
ALT: 13 IU/L (ref 0–32)
AST: 18 IU/L (ref 0–40)
Albumin/Globulin Ratio: 2.2 (ref 1.2–2.2)
Albumin: 4.3 g/dL (ref 3.5–5.5)
Alkaline Phosphatase: 42 IU/L (ref 39–117)
BUN/Creatinine Ratio: 17 (ref 9–23)
BUN: 18 mg/dL (ref 6–24)
Bilirubin Total: 0.2 mg/dL (ref 0.0–1.2)
CO2: 20 mmol/L (ref 18–29)
Calcium: 9 mg/dL (ref 8.7–10.2)
Chloride: 104 mmol/L (ref 96–106)
Creatinine, Ser: 1.06 mg/dL — ABNORMAL HIGH (ref 0.57–1.00)
GFR calc Af Amer: 69 mL/min/{1.73_m2} (ref >59)
GFR calc non Af Amer: 60 mL/min/{1.73_m2} (ref >59)
Globulin, Total: 2 g/dL (ref 1.5–4.5)
Glucose: 91 mg/dL (ref 65–99)
Potassium: 4.3 mmol/L (ref 3.5–5.2)
Sodium: 142 mmol/L (ref 134–144)
Total Protein: 6.3 g/dL (ref 6.0–8.5)

## 2016-05-16 LAB — TSH: TSH: 4.15 u[IU]/mL (ref 0.450–4.500)

## 2016-05-16 NOTE — Telephone Encounter (Signed)
Patient notified, she states that her insurance does cover the ProAir, it's just that her part is still very expensive. I asked her to call her insurance company and/or pharmacy to see if there is a lower copay inhaler and if so, to let us know and we can see about changing it.

## 2016-05-16 NOTE — Telephone Encounter (Signed)
Can we call her pharmacy and see what HFA rescue inhaler her insurance covers?

## 2016-06-16 ENCOUNTER — Other Ambulatory Visit: Payer: Self-pay | Admitting: Unknown Physician Specialty

## 2016-07-15 ENCOUNTER — Ambulatory Visit: Payer: BLUE CROSS/BLUE SHIELD | Admitting: Unknown Physician Specialty

## 2016-08-12 ENCOUNTER — Ambulatory Visit: Payer: BLUE CROSS/BLUE SHIELD | Admitting: Unknown Physician Specialty

## 2016-08-19 ENCOUNTER — Telehealth: Payer: Self-pay | Admitting: Unknown Physician Specialty

## 2016-08-19 NOTE — Telephone Encounter (Signed)
Please let her know her labs were normal

## 2016-08-19 NOTE — Telephone Encounter (Signed)
Called and left patient a voicemail asking for her to please return my call.  

## 2016-08-19 NOTE — Telephone Encounter (Signed)
Pt would like to get lab results  

## 2016-08-19 NOTE — Progress Notes (Signed)
Called pt to schedule CPE, no answer, left message on voicemail to return call. Thanks.

## 2016-08-19 NOTE — Progress Notes (Signed)
Pt scheduled CPE for 11/21/16 @ 3pm. Thanks.

## 2016-08-19 NOTE — Telephone Encounter (Signed)
Called pt to schedule CPE, CPE scheduled for 11/21/16. Patient stated she would like to talk to someone about her lab results. Please call her on her cell phone after 5pm. Thanks.

## 2016-08-19 NOTE — Telephone Encounter (Signed)
Routing to provider  

## 2016-08-20 NOTE — Telephone Encounter (Signed)
Called and left patient a voicemail letting her know that all of her labs were normal. I asked for her to give us a call if she has any other questions or concerns.

## 2016-11-21 ENCOUNTER — Encounter: Payer: BLUE CROSS/BLUE SHIELD | Admitting: Unknown Physician Specialty

## 2016-12-08 ENCOUNTER — Encounter: Payer: BLUE CROSS/BLUE SHIELD | Admitting: Unknown Physician Specialty

## 2017-02-05 ENCOUNTER — Other Ambulatory Visit: Payer: Self-pay | Admitting: Unknown Physician Specialty

## 2017-02-08 ENCOUNTER — Other Ambulatory Visit: Payer: Self-pay | Admitting: Unknown Physician Specialty

## 2017-02-11 ENCOUNTER — Ambulatory Visit (INDEPENDENT_AMBULATORY_CARE_PROVIDER_SITE_OTHER): Payer: Self-pay | Admitting: Unknown Physician Specialty

## 2017-02-11 ENCOUNTER — Encounter: Payer: Self-pay | Admitting: Unknown Physician Specialty

## 2017-02-11 VITALS — BP 117/79 | HR 69 | Temp 98.7°F | Ht 65.8 in | Wt 175.0 lb

## 2017-02-11 DIAGNOSIS — E039 Hypothyroidism, unspecified: Secondary | ICD-10-CM

## 2017-02-11 DIAGNOSIS — E785 Hyperlipidemia, unspecified: Secondary | ICD-10-CM

## 2017-02-11 DIAGNOSIS — R059 Cough, unspecified: Secondary | ICD-10-CM

## 2017-02-11 DIAGNOSIS — J45909 Unspecified asthma, uncomplicated: Secondary | ICD-10-CM

## 2017-02-11 DIAGNOSIS — R05 Cough: Secondary | ICD-10-CM

## 2017-02-11 DIAGNOSIS — F324 Major depressive disorder, single episode, in partial remission: Secondary | ICD-10-CM

## 2017-02-11 MED ORDER — ALBUTEROL SULFATE HFA 108 (90 BASE) MCG/ACT IN AERS: 2.0000 | INHALATION_SPRAY | Freq: Four times a day (QID) | RESPIRATORY_TRACT | 2 refills | Status: DC | PRN

## 2017-02-11 MED ORDER — LOVASTATIN 20 MG PO TABS: 20.0000 mg | ORAL_TABLET | Freq: Every day | ORAL | 3 refills | Status: DC

## 2017-02-11 MED ORDER — FLUOXETINE HCL 20 MG PO CAPS: 20.0000 mg | ORAL_CAPSULE | Freq: Every day | ORAL | 3 refills | Status: DC

## 2017-02-11 MED ORDER — TRAZODONE HCL 50 MG PO TABS: 50.0000 mg | ORAL_TABLET | Freq: Every evening | ORAL | 5 refills | Status: DC | PRN

## 2017-02-11 MED ORDER — BUDESONIDE-FORMOTEROL FUMARATE 160-4.5 MCG/ACT IN AERO: 2.0000 | INHALATION_SPRAY | Freq: Two times a day (BID) | RESPIRATORY_TRACT | 3 refills | Status: DC

## 2017-02-11 MED ORDER — AZELASTINE HCL 0.05 % OP SOLN: 1.0000 [drp] | Freq: Two times a day (BID) | OPHTHALMIC | 12 refills | Status: DC

## 2017-02-11 NOTE — Assessment & Plan Note (Signed)
Poorly controlled.  Rx for Symbicort

## 2017-02-11 NOTE — Progress Notes (Signed)
BP 117/79 (BP Location: Left Arm, Patient Position: Sitting, Cuff Size: Normal)   Pulse 69   Temp 98.7 F (37.1 C)   Ht 5' 5.8" (1.671 m)   Wt 175 lb (79.4 kg)   LMP  (LMP Unknown)   SpO2 97%   BMI 28.42 kg/m    Subjective:    Patient ID: Audrey Richard, female    DOB: Aug 24, 1961, 56 y.o.   MRN: [id number]  HPI: Audrey Richard is a 56 y.o. female  Chief Complaint  Patient presents with  . Cough    pt states she has had cough since she broke her ribs a while back, over a year ago   . Medication Refill    pt states she would like a refill for eye drops in chart  . Sciatica    pt states her sciatica has been acting up and would like tramadol   . Labs Only    pt states she is interested in Hep C and HIV     Depression States she doesn't want to come out of room sometimes.  States she would be better if she has a job.  Describes mood swings.   Depression screen Audrey Richard [facility] 2/9 02/11/2017 01/14/2016  Decreased Interest 1 2  Down, Depressed, Hopeless 3 1  PHQ - 2 Score 4 3  Altered sleeping 1 0  Tired, decreased energy 3 2  Change in appetite 1 0  Feeling bad or failure about yourself  2 1  Trouble concentrating 0 0  Moving slowly or fidgety/restless 1 0  Suicidal thoughts 0 0  PHQ-9 Score 12 6    Hyperlipidemia Using medications without problems: No Muscle aches  Diet compliance: Exercise:  Cough Noted a cough ever since sustaining rib fractures.  Former smoker but quit over 10 years ago.  States it is always bad.  States it feels like a lot of pressure in her ribs.  Wakes up in the middle of the night.  Coughing colored mucous.  Not taking inhalers due to cost.    Hypothyroid Not checked for over a year  Social History   Social History  . Marital status: Divorced    Spouse name: N/A  . Number of children: N/A  . Years of education: N/A   Occupational History  . Not on file.   Social History Main Topics  . Smoking status: Former Smoker    Quit date:  11/15/2005  . Smokeless tobacco: Never Used  . Alcohol use Yes     Comment: pt states on occasion  . Drug use: No  . Sexual activity: Yes   Other Topics Concern  . Not on file   Social History Narrative  . No narrative on file    Relevant past medical, surgical, family and social history reviewed and updated as indicated. Interim medical history since our last visit reviewed. Allergies and medications reviewed and updated.  Review of Systems  Per HPI unless specifically indicated above     Objective:    BP 117/79 (BP Location: Left Arm, Patient Position: Sitting, Cuff Size: Normal)   Pulse 69   Temp 98.7 F (37.1 C)   Ht 5' 5.8" (1.671 m)   Wt 175 lb (79.4 kg)   LMP  (LMP Unknown)   SpO2 97%   BMI 28.42 kg/m   Wt Readings from Last 3 Encounters:  02/11/17 175 lb (79.4 kg)  02/11/16 173 lb 3.2 oz (78.6 kg)  01/28/16 169 lb 6.4 oz (76.8  kg)    Physical Exam  Constitutional: She is oriented to person, place, and time. She appears well-developed and well-nourished. No distress.  HENT:  Head: Normocephalic and atraumatic.  Eyes: Conjunctivae and lids are normal. Right eye exhibits no discharge. Left eye exhibits no discharge. No scleral icterus.  Neck: Normal range of motion. Neck supple. No JVD present. Carotid bruit is not present.  Cardiovascular: Normal rate, regular rhythm and normal heart sounds.   Pulmonary/Chest: Effort normal and breath sounds normal.  Abdominal: Normal appearance. There is no splenomegaly or hepatomegaly.  Musculoskeletal: Normal range of motion.  Neurological: She is alert and oriented to person, place, and time.  Skin: Skin is warm, dry and intact. No rash noted. No pallor.  Psychiatric: She has a normal mood and affect. Her behavior is normal. Judgment and thought content normal.    Results for orders placed or performed in visit on 05/15/16  Comprehensive metabolic panel  Result Value Ref Range   Glucose 91 65 - 99 mg/dL   BUN 18 6 -  24 mg/dL   Creatinine, Ser 1.611.06 (H) 0.57 - 1.00 mg/dL   GFR calc non Af Amer 60 >59 mL/min/1.73   GFR calc Af Amer 69 >59 mL/min/1.73   BUN/Creatinine Ratio 17 9 - 23   Sodium 142 134 - 144 mmol/L   Potassium 4.3 3.5 - 5.2 mmol/L   Chloride 104 96 - 106 mmol/L   CO2 20 18 - 29 mmol/L   Calcium 9.0 8.7 - 10.2 mg/dL   Total Protein 6.3 6.0 - 8.5 g/dL   Albumin 4.3 3.5 - 5.5 g/dL   Globulin, Total 2.0 1.5 - 4.5 g/dL   Albumin/Globulin Ratio 2.2 1.2 - 2.2   Bilirubin Total 0.2 0.0 - 1.2 mg/dL   Alkaline Phosphatase 42 39 - 117 IU/L   AST 18 0 - 40 IU/L   ALT 13 0 - 32 IU/L  TSH  Result Value Ref Range   TSH 4.150 0.450 - 4.500 uIU/mL      Assessment & Plan:   Problem List Items Addressed This Visit      Unprioritized   Asthma    Poorly controlled.  Rx for Symbicort      Relevant Medications   budesonide-formoterol (SYMBICORT) 160-4.5 MCG/ACT inhaler   albuterol (PROVENTIL HFA;VENTOLIN HFA) 108 (90 Base) MCG/ACT inhaler   Depression    Poor today but wonder if related to asthma      Relevant Medications   traZODone (DESYREL) 50 MG tablet   FLUoxetine (PROZAC) 20 MG capsule   Other Relevant Orders   CBC with Differential/Platelet   Hyperlipidemia   Relevant Medications   lovastatin (MEVACOR) 20 MG tablet   Other Relevant Orders   Comprehensive metabolic panel   Lipid Panel w/o Chol/HDL Ratio   Hypothyroidism   Relevant Orders   TSH    Other Visit Diagnoses    Cough    -  Primary   Relevant Orders   Spirometry with Graph (Completed)                                                                  Follow up plan: Return in about 3 months (around 05/11/2017).

## 2017-02-11 NOTE — Assessment & Plan Note (Signed)
Poor today but wonder if related to asthma

## 2017-02-12 LAB — COMPREHENSIVE METABOLIC PANEL
ALT: 13 IU/L (ref 0–32)
AST: 16 IU/L (ref 0–40)
Albumin/Globulin Ratio: 1.9 (ref 1.2–2.2)
Albumin: 4.2 g/dL (ref 3.5–5.5)
Alkaline Phosphatase: 41 IU/L (ref 39–117)
BUN/Creatinine Ratio: 10 (ref 9–23)
BUN: 11 mg/dL (ref 6–24)
Bilirubin Total: 0.3 mg/dL (ref 0.0–1.2)
CO2: 23 mmol/L (ref 18–29)
Calcium: 8.8 mg/dL (ref 8.7–10.2)
Chloride: 103 mmol/L (ref 96–106)
Creatinine, Ser: 1.14 mg/dL — ABNORMAL HIGH (ref 0.57–1.00)
GFR calc Af Amer: 63 (ref >59)
GFR calc non Af Amer: 54 — ABNORMAL LOW (ref >59)
Globulin, Total: 2.2 (ref 1.5–4.5)
Glucose: 84 mg/dL (ref 65–99)
Potassium: 4.5 mmol/L (ref 3.5–5.2)
Sodium: 139 mmol/L (ref 134–144)
Total Protein: 6.4 g/dL (ref 6.0–8.5)

## 2017-02-12 LAB — CBC WITH DIFFERENTIAL/PLATELET
Basophils Absolute: 0 10*3/uL (ref 0.0–0.2)
Basos: 1 %
EOS (ABSOLUTE): 0.1 10*3/uL (ref 0.0–0.4)
Eos: 3 %
Hematocrit: 42.6 % (ref 34.0–46.6)
Hemoglobin: 14.2 g/dL (ref 11.1–15.9)
Immature Grans (Abs): 0 10*3/uL (ref 0.0–0.1)
Immature Granulocytes: 0 %
Lymphocytes Absolute: 1 10*3/uL (ref 0.7–3.1)
Lymphs: 22 %
MCH: 31.6 pg (ref 26.6–33.0)
MCHC: 33.3 g/dL (ref 31.5–35.7)
MCV: 95 fL (ref 79–97)
Monocytes Absolute: 0.3 10*3/uL (ref 0.1–0.9)
Monocytes: 8 %
Neutrophils Absolute: 3 10*3/uL (ref 1.4–7.0)
Neutrophils: 66 %
Platelets: 242 10*3/uL (ref 150–379)
RBC: 4.49 x10E6/uL (ref 3.77–5.28)
RDW: 13.7 % (ref 12.3–15.4)
WBC: 4.4 10*3/uL (ref 3.4–10.8)

## 2017-02-12 LAB — LIPID PANEL W/O CHOL/HDL RATIO
Cholesterol, Total: 167 mg/dL (ref 100–199)
HDL: 56 mg/dL (ref >39)
LDL Calculated: 97 (ref 0–99)
Triglycerides: 70 mg/dL (ref 0–149)
VLDL Cholesterol Cal: 14 (ref 5–40)

## 2017-02-12 LAB — TSH: TSH: 5.14 u[IU]/mL — ABNORMAL HIGH (ref 0.450–4.500)

## 2017-02-13 ENCOUNTER — Other Ambulatory Visit: Payer: Self-pay | Admitting: Unknown Physician Specialty

## 2017-02-13 DIAGNOSIS — E039 Hypothyroidism, unspecified: Secondary | ICD-10-CM

## 2017-02-16 ENCOUNTER — Encounter: Payer: Self-pay | Admitting: Unknown Physician Specialty

## 2017-02-16 ENCOUNTER — Telehealth: Payer: Self-pay

## 2017-02-16 ENCOUNTER — Other Ambulatory Visit: Payer: Self-pay | Admitting: Unknown Physician Specialty

## 2017-02-16 DIAGNOSIS — E039 Hypothyroidism, unspecified: Secondary | ICD-10-CM

## 2017-02-16 MED ORDER — LEVOTHYROXINE SODIUM 112 MCG PO TABS: 112.0000 ug | ORAL_TABLET | Freq: Every day | ORAL | 4 refills | Status: DC

## 2017-02-16 MED ORDER — ALBUTEROL SULFATE HFA 108 (90 BASE) MCG/ACT IN AERS: 2.0000 | INHALATION_SPRAY | Freq: Four times a day (QID) | RESPIRATORY_TRACT | 2 refills | Status: DC | PRN

## 2017-02-16 MED ORDER — AZELASTINE HCL 0.05 % OP SOLN: 1.0000 [drp] | Freq: Two times a day (BID) | OPHTHALMIC | 12 refills | Status: DC

## 2017-02-16 MED ORDER — LOVASTATIN 40 MG PO TABS: 20.0000 mg | ORAL_TABLET | Freq: Every day | ORAL | 3 refills | Status: DC

## 2017-02-16 MED ORDER — BUPROPION HCL ER (XL) 150 MG PO TB24: 150.0000 mg | ORAL_TABLET | Freq: Every day | ORAL | 11 refills | Status: DC

## 2017-02-16 NOTE — Telephone Encounter (Signed)
Pharmacy sent a fax stating that lovastatin 20 mg is on backorder. Can patient be given a 40 mg tablet and take half of it daily? If so, please send in new prescription with new directions. Pharmacy is [facility] [place]

## 2017-02-16 NOTE — Telephone Encounter (Signed)
Pt called and stated that she needs buPROPion (WELLBUTRIN XL) 150 MG 24 hr tablet, levothyroxine (SYNTHROID, LEVOTHROID) 112 MCG tablet, albuterol (PROVENTIL HFA;VENTOLIN HFA) 108 (90 Base) MCG/ACT inhaler, and azelastine (OPTIVAR) 0.05 % ophthalmic solution sent to [facility] [place] again. Pharmacy didn't receive these 02/11/17.

## 2017-02-16 NOTE — Telephone Encounter (Signed)
Elnita MaxwellCheryl, do you mind resending these medication to the pharmacy?

## 2017-04-28 ENCOUNTER — Ambulatory Visit: Payer: Self-pay | Admitting: Unknown Physician Specialty

## 2017-06-08 ENCOUNTER — Ambulatory Visit (INDEPENDENT_AMBULATORY_CARE_PROVIDER_SITE_OTHER): Payer: Self-pay | Admitting: Unknown Physician Specialty

## 2017-06-08 ENCOUNTER — Encounter: Payer: Self-pay | Admitting: Unknown Physician Specialty

## 2017-06-08 VITALS — BP 119/78 | HR 75 | Temp 98.0°F | Wt 179.4 lb

## 2017-06-08 DIAGNOSIS — J45909 Unspecified asthma, uncomplicated: Secondary | ICD-10-CM

## 2017-06-08 DIAGNOSIS — Z114 Encounter for screening for human immunodeficiency virus [HIV]: Secondary | ICD-10-CM

## 2017-06-08 DIAGNOSIS — E039 Hypothyroidism, unspecified: Secondary | ICD-10-CM

## 2017-06-08 DIAGNOSIS — F324 Major depressive disorder, single episode, in partial remission: Secondary | ICD-10-CM

## 2017-06-08 DIAGNOSIS — Z1159 Encounter for screening for other viral diseases: Secondary | ICD-10-CM

## 2017-06-08 MED ORDER — ALBUTEROL SULFATE HFA 108 (90 BASE) MCG/ACT IN AERS: 2.0000 | INHALATION_SPRAY | Freq: Four times a day (QID) | RESPIRATORY_TRACT | 2 refills | Status: DC | PRN

## 2017-06-08 NOTE — Assessment & Plan Note (Signed)
Stable.  Some exacerbation with dad being ill

## 2017-06-08 NOTE — Assessment & Plan Note (Addendum)
Doing better with Symbicort. Gave a pt samples

## 2017-06-08 NOTE — Assessment & Plan Note (Signed)
Stable last 2 visit.

## 2017-06-08 NOTE — Progress Notes (Signed)
BP 119/78   Pulse 75   Temp 98 F (36.7 C)   Wt 179 lb 6.4 oz (81.4 kg)   LMP  (LMP Unknown)   SpO2 95%   BMI 29.13 kg/m    Subjective:    Patient ID: Audrey Richard, female    DOB: 1961/04/15, 56 y.o.   MRN: [id number]  HPI: Audrey Richard is a 56 y.o. female  Chief Complaint  Patient presents with  . Depression  . Asthma  . Labs Only    HIV and Hep C orders entered   . Sciatica    pt states that she has had a medication in the past for her sciatica and would like a new prescription for it   Asthma Last visit, pt had problems with Asthma.  Started on Symbicort and doing better.  Still struggles with her cough   Depression Doing better because she has a job.  Her father is ill and struggling.   Depression screen [facility] 2/9 06/08/2017 02/11/2017 01/14/2016  Decreased Interest 1 1 2   Down, Depressed, Hopeless 1 3 1   PHQ - 2 Score 2 4 3   Altered sleeping 2 1 0  Tired, decreased energy 2 3 2   Change in appetite 2 1 0  Feeling bad or failure about yourself  1 2 1   Trouble concentrating 0 0 0  Moving slowly or fidgety/restless 1 1 0  Suicidal thoughts 0 0 0  PHQ-9 Score 10 12 6     Relevant past medical, surgical, family and social history reviewed and updated as indicated. Interim medical history since our last visit reviewed. Allergies and medications reviewed and updated.  Review of Systems  Per HPI unless specifically indicated above     Objective:    BP 119/78   Pulse 75   Temp 98 F (36.7 C)   Wt 179 lb 6.4 oz (81.4 kg)   LMP  (LMP Unknown)   SpO2 95%   BMI 29.13 kg/m   Wt Readings from Last 3 Encounters:  06/08/17 179 lb 6.4 oz (81.4 kg)  02/11/17 175 lb (79.4 kg)  02/11/16 173 lb 3.2 oz (78.6 kg)    Physical Exam  Constitutional: She is oriented to person, place, and time. She appears well-developed and well-nourished. No distress.  HENT:  Head: Normocephalic and atraumatic.  Eyes: Conjunctivae and lids are normal. Right eye exhibits no  discharge. Left eye exhibits no discharge. No scleral icterus.  Neck: Normal range of motion. Neck supple. No JVD present. Carotid bruit is not present.  Cardiovascular: Normal rate, regular rhythm and normal heart sounds.   Pulmonary/Chest: Effort normal and breath sounds normal.  Abdominal: Normal appearance. There is no splenomegaly or hepatomegaly.  Musculoskeletal: Normal range of motion.  Neurological: She is alert and oriented to person, place, and time.  Skin: Skin is warm, dry and intact. No rash noted. No pallor.  Psychiatric: She has a normal mood and affect. Her behavior is normal. Judgment and thought content normal.    Results for orders placed or performed in visit on 02/11/17  Comprehensive metabolic panel  Result Value Ref Range   Glucose 84 65 - 99 mg/dL   BUN 11 6 - 24 mg/dL   Creatinine, Ser 4.09 (H) 0.57 - 1.00 mg/dL   GFR calc non Af Amer 54 (L) >59   GFR calc Af Amer 63 >59   BUN/Creatinine Ratio 10 9 - 23   Sodium 139 134 - 144 mmol/L   Potassium 4.5  3.5 - 5.2 mmol/L   Chloride 103 96 - 106 mmol/L   CO2 23 18 - 29 mmol/L   Calcium 8.8 8.7 - 10.2 mg/dL   Total Protein 6.4 6.0 - 8.5 g/dL   Albumin 4.2 3.5 - 5.5 g/dL   Globulin, Total 2.2 1.5 - 4.5   Albumin/Globulin Ratio 1.9 1.2 - 2.2   Bilirubin Total 0.3 0.0 - 1.2 mg/dL   Alkaline Phosphatase 41 39 - 117 IU/L   AST 16 0 - 40 IU/L   ALT 13 0 - 32 IU/L  TSH  Result Value Ref Range   TSH 5.140 (H) 0.450 - 4.500 uIU/mL  CBC with Differential/Platelet  Result Value Ref Range   WBC 4.4 3.4 - 10.8 x10E3/uL   RBC 4.49 3.77 - 5.28 x10E6/uL   Hemoglobin 14.2 11.1 - 15.9 g/dL   Hematocrit 29.542.6 28.434.0 - 46.6 %   MCV 95 79 - 97 fL   MCH 31.6 26.6 - 33.0 pg   MCHC 33.3 31.5 - 35.7 g/dL   RDW 13.213.7 44.012.3 - 10.215.4 %   Platelets 242 150 - 379 x10E3/uL   Neutrophils 66 Not Estab. %   Lymphs 22 Not Estab. %   Monocytes 8 Not Estab. %   Eos 3 Not Estab. %   Basos 1 Not Estab. %   Neutrophils Absolute 3.0 1.4 - 7.0  x10E3/uL   Lymphocytes Absolute 1.0 0.7 - 3.1 x10E3/uL   Monocytes Absolute 0.3 0.1 - 0.9 x10E3/uL   EOS (ABSOLUTE) 0.1 0.0 - 0.4 x10E3/uL   Basophils Absolute 0.0 0.0 - 0.2 x10E3/uL   Immature Granulocytes 0 Not Estab. %   Immature Grans (Abs) 0.0 0.0 - 0.1 x10E3/uL  Lipid Panel w/o Chol/HDL Ratio  Result Value Ref Range   Cholesterol, Total 167 100 - 199 mg/dL   Triglycerides 70 0 - 149 mg/dL   HDL 56 >72>39 mg/dL   VLDL Cholesterol Cal 14 5 - 40   LDL Calculated 97 0 - 99      Assessment & Plan:   Problem List Items Addressed This Visit      Unprioritized   Asthma    Doing better with Symbicort. Gave a pt samples      Relevant Medications   albuterol (PROVENTIL HFA;VENTOLIN HFA) 108 (90 Base) MCG/ACT inhaler   Depression    Stable.  Some exacerbation with dad being ill      Hypothyroidism    Stable last 2 visit.       Other Visit Diagnoses    Need for hepatitis C screening test    -  Primary   Screening for HIV without presence of risk factors           Follow up plan: Return in about 6 months (around 12/08/2017).

## 2017-07-20 ENCOUNTER — Telehealth: Payer: Self-pay

## 2017-07-20 NOTE — Telephone Encounter (Signed)
Called the number patient listed on her form and spoke with her. She states that Douglasheryl gave her this form to fill out for assistance with paying for her inhalers. Form seems to be specifically for synagis. Found a generic application on [employer] website and printed it out. Will talk with Elnita Maxwellheryl and see if this is what she was trying to do when the patient was here for her appointment.

## 2017-07-20 NOTE — Telephone Encounter (Signed)
Tried calling patient to get some more information regarding the form she dropped off to be filled out for [employer]- Synagis (palivizumab). This medication was not prescribed by us so it is not clear why we are needing to fill this out. The number on the patient's chart is a wrong number according to the person that answered. Will try to find another number.

## 2017-07-21 NOTE — Telephone Encounter (Signed)
Form filled out and signed by Audrey Richard. Tried calling patient to let her know that her form was ready to be picked up. A man answered and stated that the patient was at work. Will try to call again later.

## 2017-07-22 NOTE — Telephone Encounter (Signed)
Called and spoke to patient. I let her know that she had the wrong form so I printed new ones for her. I explained that I filled out everything I could and Audrey Richard signed it, but let her know that she would need to sign her name again. Patient verbalized understanding and stated she would probably be by tomorrow to pick up the forms. Forms placed up front for pick up.

## 2017-12-01 ENCOUNTER — Other Ambulatory Visit: Payer: Self-pay | Admitting: Unknown Physician Specialty

## 2017-12-01 DIAGNOSIS — E039 Hypothyroidism, unspecified: Secondary | ICD-10-CM

## 2017-12-08 ENCOUNTER — Ambulatory Visit (INDEPENDENT_AMBULATORY_CARE_PROVIDER_SITE_OTHER): Payer: Self-pay | Admitting: Unknown Physician Specialty

## 2017-12-08 ENCOUNTER — Encounter: Payer: Self-pay | Admitting: Unknown Physician Specialty

## 2017-12-08 DIAGNOSIS — N941 Unspecified dyspareunia: Secondary | ICD-10-CM | POA: Insufficient documentation

## 2017-12-08 DIAGNOSIS — F324 Major depressive disorder, single episode, in partial remission: Secondary | ICD-10-CM

## 2017-12-08 DIAGNOSIS — J45909 Unspecified asthma, uncomplicated: Secondary | ICD-10-CM

## 2017-12-08 DIAGNOSIS — E039 Hypothyroidism, unspecified: Secondary | ICD-10-CM

## 2017-12-08 DIAGNOSIS — J310 Chronic rhinitis: Secondary | ICD-10-CM

## 2017-12-08 DIAGNOSIS — J309 Allergic rhinitis, unspecified: Secondary | ICD-10-CM | POA: Insufficient documentation

## 2017-12-08 MED ORDER — MONTELUKAST SODIUM 10 MG PO TABS: 10.0000 mg | ORAL_TABLET | Freq: Every day | ORAL | 3 refills | Status: DC

## 2017-12-08 NOTE — Progress Notes (Signed)
BP 137/85   Pulse 67   Temp 97.8 F (36.6 C) (Oral)   Wt 175 lb (79.4 kg)   LMP  (LMP Unknown)   SpO2 95%   BMI 28.42 kg/m    Subjective:    Patient ID: Audrey Richard, female    DOB: August 18, 1961, 56 y.o.   MRN: [id number]  HPI: Audrey Richard is a 56 y.o. female  Chief Complaint  Patient presents with  . Depression  . Hyperlipidemia  . Hypothyroidism  . Cough    pt states she still has a lingering cough   Depression Still working with her ill father and feeling like that is exacerbating her depression Depression [facility] 2/9 12/08/2017 06/08/2017 02/11/2017 01/14/2016  Decreased Interest 1 1 1 2   Down, Depressed, Hopeless 1 1 3 1   PHQ - 2 Score 2 2 4 3   Altered sleeping 1 2 1  0  Tired, decreased energy 2 2 3 2   Change in appetite 1 2 1  0  Feeling bad or failure about yourself  2 1 2 1   Trouble concentrating 0 0 0 0  Moving slowly or fidgety/restless 1 1 1  0  Suicidal thoughts 0 0 0 0  PHQ-9 Score 9 10 12 6    Hypothyroid A litlte underactive last visit.  Will check todya  Asthma States she is doing well but still has a cough.  She feels this is due to drainage when she sits upright.  Uses Symbicort twice a day and Ventolin once or twice a day  Hyperlipidemia Stable last visit with LDL of 97 on Mevacor.    Dyspareunia Painful intercourse at area of penetration  CKD Last GFR 54.  Recheck today  Relevant past medical, surgical, family and social history reviewed and updated as indicated. Interim medical history since our last visit reviewed. Allergies and medications reviewed and updated.  Review of Systems  Per HPI unless specifically indicated above     Objective:    BP 137/85   Pulse 67   Temp 97.8 F (36.6 C) (Oral)   Wt 175 lb (79.4 kg)   LMP  (LMP Unknown)   SpO2 95%   BMI 28.42 kg/m   Wt Readings from Last 3 Encounters:  12/08/17 175 lb (79.4 kg)  06/08/17 179 lb 6.4 oz (81.4 kg)  02/11/17 175 lb (79.4 kg)    Physical Exam    Constitutional: She is oriented to person, place, and time. She appears well-developed and well-nourished. No distress.  HENT:  Head: Normocephalic and atraumatic.  Eyes: Conjunctivae and lids are normal. Right eye exhibits no discharge. Left eye exhibits no discharge. No scleral icterus.  Neck: Normal range of motion. Neck supple. No JVD present. Carotid bruit is not present.  Cardiovascular: Normal rate, regular rhythm and normal heart sounds.  Pulmonary/Chest: Effort normal and breath sounds normal.  Abdominal: Normal appearance. There is no splenomegaly or hepatomegaly.  Musculoskeletal: Normal range of motion.  Neurological: She is alert and oriented to person, place, and time.  Skin: Skin is warm, dry and intact. No rash noted. No pallor.  Psychiatric: She has a normal mood and affect. Her behavior is normal. Judgment and thought content normal.    Results for orders placed or performed in visit on 02/11/17  Comprehensive metabolic panel  Result Value Ref Range   Glucose 84 65 - 99 mg/dL   BUN 11 6 - 24 mg/dL   Creatinine, Ser 4.091.14 (H) 0.57 - 1.00 mg/dL   GFR calc  non Af Amer 54 (L) >59   GFR calc Af Amer 63 >59   BUN/Creatinine Ratio 10 9 - 23   Sodium 139 134 - 144 mmol/L   Potassium 4.5 3.5 - 5.2 mmol/L   Chloride 103 96 - 106 mmol/L   CO2 23 18 - 29 mmol/L   Calcium 8.8 8.7 - 10.2 mg/dL   Total Protein 6.4 6.0 - 8.5 g/dL   Albumin 4.2 3.5 - 5.5 g/dL   Globulin, Total 2.2 1.5 - 4.5   Albumin/Globulin Ratio 1.9 1.2 - 2.2   Bilirubin Total 0.3 0.0 - 1.2 mg/dL   Alkaline Phosphatase 41 39 - 117 IU/L   AST 16 0 - 40 IU/L   ALT 13 0 - 32 IU/L  TSH  Result Value Ref Range   TSH 5.140 (H) 0.450 - 4.500 uIU/mL  CBC with Differential/Platelet  Result Value Ref Range   WBC 4.4 3.4 - 10.8 x10E3/uL   RBC 4.49 3.77 - 5.28 x10E6/uL   Hemoglobin 14.2 11.1 - 15.9 g/dL   Hematocrit 16.142.6 09.634.0 - 46.6 %   MCV 95 79 - 97 fL   MCH 31.6 26.6 - 33.0 pg   MCHC 33.3 31.5 - 35.7 g/dL    RDW 04.513.7 40.912.3 - 81.115.4 %   Platelets 242 150 - 379 x10E3/uL   Neutrophils 66 Not Estab. %   Lymphs 22 Not Estab. %   Monocytes 8 Not Estab. %   Eos 3 Not Estab. %   Basos 1 Not Estab. %   Neutrophils Absolute 3.0 1.4 - 7.0 x10E3/uL   Lymphocytes Absolute 1.0 0.7 - 3.1 x10E3/uL   Monocytes Absolute 0.3 0.1 - 0.9 x10E3/uL   EOS (ABSOLUTE) 0.1 0.0 - 0.4 x10E3/uL   Basophils Absolute 0.0 0.0 - 0.2 x10E3/uL   Immature Granulocytes 0 Not Estab. %   Immature Grans (Abs) 0.0 0.0 - 0.1 x10E3/uL  Lipid Panel w/o Chol/HDL Ratio  Result Value Ref Range   Cholesterol, Total 167 100 - 199 mg/dL   Triglycerides 70 0 - 149 mg/dL   HDL 56 >91>39 mg/dL   VLDL Cholesterol Cal 14 5 - 40   LDL Calculated 97 0 - 99      Assessment & Plan:   Problem List Items Addressed This Visit      Unprioritized   Asthma    Doing well.  Cough seems to be related to rhinitis      Relevant Medications   montelukast (SINGULAIR) 10 MG tablet   Depression    Stable, continue present medications.        Dyspareunia, female    Discussed Replense and Astroglide.  If that doesn't work, will consider hormone therapy      Hypothyroidism    Check TSH      Relevant Orders   TSH   Rhinitis    Using Benadryl prn.  Add Singulair      Relevant Orders   Comprehensive metabolic panel       Follow up plan: Return in about 6 months (around 06/08/2018).

## 2017-12-08 NOTE — Assessment & Plan Note (Signed)
Using Benadryl prn.  Add Singulair

## 2017-12-08 NOTE — Assessment & Plan Note (Signed)
Discussed Replense and Astroglide.  If that doesn't work, will consider hormone therapy

## 2017-12-08 NOTE — Assessment & Plan Note (Signed)
Doing well.  Cough seems to be related to rhinitis

## 2017-12-08 NOTE — Patient Instructions (Signed)
Replense - Use daily or as symptoms improve Astroglide  --------------------------------

## 2017-12-08 NOTE — Assessment & Plan Note (Signed)
Check TSH 

## 2017-12-08 NOTE — Assessment & Plan Note (Signed)
Stable, continue present medications.   

## 2017-12-09 ENCOUNTER — Encounter: Payer: Self-pay | Admitting: Unknown Physician Specialty

## 2017-12-09 ENCOUNTER — Telehealth: Payer: Self-pay | Admitting: Unknown Physician Specialty

## 2017-12-09 LAB — COMPREHENSIVE METABOLIC PANEL
ALT: 12 IU/L (ref 0–32)
AST: 15 IU/L (ref 0–40)
Albumin/Globulin Ratio: 2.3 — ABNORMAL HIGH (ref 1.2–2.2)
Albumin: 4.8 g/dL (ref 3.5–5.5)
Alkaline Phosphatase: 40 IU/L (ref 39–117)
BUN/Creatinine Ratio: 17 (ref 9–23)
BUN: 20 mg/dL (ref 6–24)
Bilirubin Total: <0.2 mg/dL (ref 0.0–1.2)
CO2: 22 mmol/L (ref 20–29)
Calcium: 9.1 mg/dL (ref 8.7–10.2)
Chloride: 105 mmol/L (ref 96–106)
Creatinine, Ser: 1.17 mg/dL — ABNORMAL HIGH (ref 0.57–1.00)
GFR calc Af Amer: 60 mL/min/{1.73_m2} (ref >59)
GFR calc non Af Amer: 52 mL/min/{1.73_m2} — ABNORMAL LOW (ref >59)
Globulin, Total: 2.1 g/dL (ref 1.5–4.5)
Glucose: 77 mg/dL (ref 65–99)
Potassium: 4.2 mmol/L (ref 3.5–5.2)
Sodium: 142 mmol/L (ref 134–144)
Total Protein: 6.9 g/dL (ref 6.0–8.5)

## 2017-12-09 LAB — TSH: TSH: 5.3 u[IU]/mL — ABNORMAL HIGH (ref 0.450–4.500)

## 2017-12-09 MED ORDER — LEVOTHYROXINE SODIUM 125 MCG PO TABS: 125.0000 ug | ORAL_TABLET | Freq: Every day | ORAL | 0 refills | Status: DC

## 2017-12-09 NOTE — Telephone Encounter (Signed)
Discussed with pt her TSH is a little low.  Will increase her Synthroid to 125 mcgs.  Kidney functions remain stable

## 2017-12-09 NOTE — Progress Notes (Signed)
Patient notified of results by phone.

## 2018-02-14 ENCOUNTER — Other Ambulatory Visit: Payer: Self-pay | Admitting: Unknown Physician Specialty

## 2018-03-02 ENCOUNTER — Other Ambulatory Visit: Payer: Self-pay | Admitting: Unknown Physician Specialty

## 2018-03-03 ENCOUNTER — Telehealth: Payer: Self-pay | Admitting: Unknown Physician Specialty

## 2018-03-03 ENCOUNTER — Other Ambulatory Visit: Payer: Self-pay | Admitting: *Deleted

## 2018-03-03 MED ORDER — MONTELUKAST SODIUM 10 MG PO TABS: 10.0000 mg | ORAL_TABLET | Freq: Every day | ORAL | 3 refills | Status: DC

## 2018-03-03 NOTE — Telephone Encounter (Signed)
Lovastatin refill Last OV: 12/08/17 Last Refill:02/16/17 Pharmacy:[facility] [address] PCP Gabriel Cirriheryl Wicker FNP  Fluoxetine refill Last OV: 12/08/17 Last Refill:02/11/17 Pharmacy:[facility] [address] PCP: Gabriel Cirriheryl Wicker FNP  Buproprion refill Last OV: 12/08/17 Last Refill:02/16/17 Pharmacy:[facility] [address] PCP: Gabriel Cirriheryl Wicker FNP

## 2018-03-03 NOTE — Telephone Encounter (Signed)
Copied from CRM [id number]. Topic: Quick Communication - Rx Refill/Question >> Mar 03, 2018 12:26 PM Stephannie LiSimmons, Bran Aldridge L, NT wrote: Medication:  montelukast (SINGULAIR) 10 MG tablet  Has the patient contacted their pharmacy? {yes (Agent: If no, request that the patient contact the pharmacy for the refill.) Preferred Pharmacy (with phone number or street name):[facility] [address] - [place] - [phone] (Phone) [phone] (Fax) Agent: Please be advised that RX refills may take up to 3 business days. We ask that you follow-up with your pharmacy.

## 2018-03-03 NOTE — Telephone Encounter (Signed)
Rx refilled per protocol- LOV: 12/18- follow up due 6/19

## 2018-06-25 ENCOUNTER — Other Ambulatory Visit: Payer: Self-pay | Admitting: Unknown Physician Specialty

## 2018-06-30 ENCOUNTER — Ambulatory Visit: Payer: Self-pay | Admitting: Unknown Physician Specialty

## 2018-07-12 ENCOUNTER — Ambulatory Visit (INDEPENDENT_AMBULATORY_CARE_PROVIDER_SITE_OTHER): Payer: Self-pay | Admitting: Unknown Physician Specialty

## 2018-07-12 ENCOUNTER — Encounter: Payer: Self-pay | Admitting: Unknown Physician Specialty

## 2018-07-12 VITALS — BP 127/82 | HR 74 | Temp 98.4°F | Ht 65.6 in | Wt 185.2 lb

## 2018-07-12 DIAGNOSIS — H1013 Acute atopic conjunctivitis, bilateral: Secondary | ICD-10-CM

## 2018-07-12 DIAGNOSIS — H1010 Acute atopic conjunctivitis, unspecified eye: Secondary | ICD-10-CM | POA: Insufficient documentation

## 2018-07-12 DIAGNOSIS — J45909 Unspecified asthma, uncomplicated: Secondary | ICD-10-CM

## 2018-07-12 DIAGNOSIS — R5383 Other fatigue: Secondary | ICD-10-CM

## 2018-07-12 DIAGNOSIS — M545 Low back pain, unspecified: Secondary | ICD-10-CM

## 2018-07-12 DIAGNOSIS — E039 Hypothyroidism, unspecified: Secondary | ICD-10-CM

## 2018-07-12 DIAGNOSIS — Z1159 Encounter for screening for other viral diseases: Secondary | ICD-10-CM

## 2018-07-12 DIAGNOSIS — G8929 Other chronic pain: Secondary | ICD-10-CM

## 2018-07-12 DIAGNOSIS — F324 Major depressive disorder, single episode, in partial remission: Secondary | ICD-10-CM

## 2018-07-12 MED ORDER — BUDESONIDE-FORMOTEROL FUMARATE 160-4.5 MCG/ACT IN AERO: 2.0000 | INHALATION_SPRAY | Freq: Two times a day (BID) | RESPIRATORY_TRACT | 3 refills | Status: DC

## 2018-07-12 MED ORDER — LEVOTHYROXINE SODIUM 125 MCG PO TABS: 125.0000 ug | ORAL_TABLET | Freq: Every day | ORAL | 0 refills | Status: DC

## 2018-07-12 MED ORDER — ALBUTEROL SULFATE HFA 108 (90 BASE) MCG/ACT IN AERS
2.0000 | INHALATION_SPRAY | Freq: Four times a day (QID) | RESPIRATORY_TRACT | 2 refills | Status: DC | PRN
Start: 2018-07-12 — End: 2018-11-17

## 2018-07-12 MED ORDER — AZELASTINE HCL 0.05 % OP SOLN: 1.0000 [drp] | Freq: Two times a day (BID) | OPHTHALMIC | 2 refills | Status: DC

## 2018-07-12 NOTE — Assessment & Plan Note (Signed)
Refill Optivar

## 2018-07-12 NOTE — Assessment & Plan Note (Signed)
Pt very fatigued.  Check TSH and check free T4.

## 2018-07-12 NOTE — Assessment & Plan Note (Signed)
Stable, continue present medications.   

## 2018-07-12 NOTE — Progress Notes (Signed)
BP 127/82   Pulse 74   Temp 98.4 F (36.9 C) (Oral)   Ht 5' 5.6" (1.666 m)   Wt 185 lb 3.2 oz (84 kg)   LMP  (LMP Unknown)   SpO2 98%   BMI 30.26 kg/m    Subjective:    Patient ID: [name], female    DOB: Jan 28, 1961, 57 y.o.   MRN: [id number]  HPI: CALAH GERSHMAN is a 57 y.o. female  Chief Complaint  Patient presents with  . Depression  . Hypertension  . Hypothyroidism   Hypertension Using medications without difficulty Average home BPs Not checking   No problems or lightheadedness No chest pain with exertion or shortness of breath No Edema  Hypothyroid States she is so tried most of the time.   Allergies Eyes are driving her crazy.  They itch badly. Azelastine worked in the past and would like a refill  Asthma Symptoms are well controlled:yes Using medications without problems:yes Night time symptoms:no Wheeze/SOB: rare and rare use of inhalers   Depression Feels mood is "OK'  Energy level is her biggest complaing Depression screen [facility] 2/9 07/12/2018 12/08/2017 06/08/2017 02/11/2017 01/14/2016  Decreased Interest _0 Down, Depressed, Hopeless _1 PHQ - 2 Score _2 Altered sleeping _3 0  Tired, decreased energy _4 Change in appetite 0 _5 0  Feeling bad or failure about yourself  0 _6 Trouble concentrating 0 0 0 0 0  Moving slowly or fidgety/restless 0 _7 0  Suicidal thoughts 0 0 0 0 0  PHQ-9 Score _8 Relevant past medical, surgical, family and social history reviewed and updated as indicated. Interim medical history since our last visit reviewed. Allergies and medications reviewed and updated.  Review of Systems  Musculoskeletal: Positive for back pain.       Back pain for years.  Doesn't stop. Improves with Aleve.  Can't ID when it is better or worse.      Per HPI unless specifically indicated above     Objective:    BP 127/82   Pulse 74   Temp 98.4 F (36.9 C) (Oral)    Ht 5' 5.6" (1.666 m)   Wt 185 lb 3.2 oz (84 kg)   LMP  (LMP Unknown)   SpO2 98%   BMI 30.26 kg/m   Wt Readings from Last 3 Encounters:  07/12/18 185 lb 3.2 oz (84 kg)  12/08/17 175 lb (79.4 kg)  06/08/17 179 lb 6.4 oz (81.4 kg)    Physical Exam  Constitutional: She is oriented to person, place, and time. She appears well-developed and well-nourished. No distress.  HENT:  Head: Normocephalic and atraumatic.  Eyes: Conjunctivae and lids are normal. Right eye exhibits no discharge. Left eye exhibits no discharge. No scleral icterus.  Bilateral eye injection  Neck: Normal range of motion. Neck supple. No JVD present. Carotid bruit is not present.  Cardiovascular: Normal rate, regular rhythm and normal heart sounds.  Pulmonary/Chest: Effort normal and breath sounds normal.  Abdominal: Normal appearance. There is no splenomegaly or hepatomegaly.  Musculoskeletal: Normal range of motion.  Neurological: She is alert and oriented to person, place, and time.  Skin: Skin is warm, dry and intact. No rash noted. No pallor.  Psychiatric: She has a normal mood and affect. Her  behavior is normal. Judgment and thought content normal.    Results for orders placed or performed in visit on 12/08/17  Comprehensive metabolic panel  Result Value Ref Range   Glucose 77 65 - 99 mg/dL   BUN 20 6 - 24 mg/dL   Creatinine, Ser 1.17 (H) 0.57 - 1.00 mg/dL   GFR calc non Af Amer 52 (L) >59 mL/min/1.73   GFR calc Af Amer 60 >59 mL/min/1.73   BUN/Creatinine Ratio 17 9 - 23   Sodium 142 134 - 144 mmol/L   Potassium 4.2 3.5 - 5.2 mmol/L   Chloride 105 96 - 106 mmol/L   CO2 22 20 - 29 mmol/L   Calcium 9.1 8.7 - 10.2 mg/dL   Total Protein 6.9 6.0 - 8.5 g/dL   Albumin 4.8 3.5 - 5.5 g/dL   Globulin, Total 2.1 1.5 - 4.5 g/dL   Albumin/Globulin Ratio 2.3 (H) 1.2 - 2.2   Bilirubin Total <0.2 0.0 - 1.2 mg/dL   Alkaline Phosphatase 40 39 - 117 IU/L   AST 15 0 - 40 IU/L   ALT 12 0 - 32 IU/L  TSH  Result Value  Ref Range   TSH 5.300 (H) 0.450 - 4.500 uIU/mL      Assessment & Plan:   Problem List Items Addressed This Visit      Unprioritized   Allergic conjunctivitis    Refill Optivar      Asthma    Stable, continue present medications.        Relevant Medications   albuterol (PROVENTIL HFA;VENTOLIN HFA) 108 (90 Base) MCG/ACT inhaler   budesonide-formoterol (SYMBICORT) 160-4.5 MCG/ACT inhaler   Depression    Stable, continue present medications.        Hypothyroidism    Pt very fatigued.  Check TSH and check free T4.        Relevant Medications   levothyroxine (SYNTHROID, LEVOTHROID) 125 MCG tablet   Other Relevant Orders   TSH    Other Visit Diagnoses    Encounter for hepatitis C screening test for low risk patient    -  Primary   Relevant Orders   Hepatitis C antibody   Chronic bilateral low back pain without sciatica       Relevant Orders   DG Lumbar Spine Complete   Fatigue, unspecified type       Biggest problem.  Check ESR, CBC, TSH, T4   Relevant Orders   TSH   CBC with Differential/Platelet   Comprehensive metabolic panel   Sed Rate (ESR)   T4, free       Follow up plan: Return in about 6 months (around 01/12/2019).

## 2018-07-13 LAB — COMPREHENSIVE METABOLIC PANEL
ALT: 14 IU/L (ref 0–32)
AST: 17 IU/L (ref 0–40)
Albumin/Globulin Ratio: 2.6 — ABNORMAL HIGH (ref 1.2–2.2)
Albumin: 4.6 g/dL (ref 3.5–5.5)
Alkaline Phosphatase: 46 IU/L (ref 39–117)
BUN/Creatinine Ratio: 13 (ref 9–23)
BUN: 16 mg/dL (ref 6–24)
Bilirubin Total: 0.3 mg/dL (ref 0.0–1.2)
CO2: 24 mmol/L (ref 20–29)
Calcium: 8.9 mg/dL (ref 8.7–10.2)
Chloride: 102 mmol/L (ref 96–106)
Creatinine, Ser: 1.22 mg/dL — ABNORMAL HIGH (ref 0.57–1.00)
GFR calc Af Amer: 57 mL/min/{1.73_m2} — ABNORMAL LOW (ref >59)
GFR calc non Af Amer: 49 mL/min/{1.73_m2} — ABNORMAL LOW (ref >59)
Globulin, Total: 1.8 g/dL (ref 1.5–4.5)
Glucose: 86 mg/dL (ref 65–99)
Potassium: 4.2 mmol/L (ref 3.5–5.2)
Sodium: 142 mmol/L (ref 134–144)
Total Protein: 6.4 g/dL (ref 6.0–8.5)

## 2018-07-13 LAB — CBC WITH DIFFERENTIAL/PLATELET
Basophils Absolute: 0 10*3/uL (ref 0.0–0.2)
Basos: 0 %
EOS (ABSOLUTE): 0.2 10*3/uL (ref 0.0–0.4)
Eos: 5 %
Hematocrit: 42.3 % (ref 34.0–46.6)
Hemoglobin: 14.5 g/dL (ref 11.1–15.9)
Immature Grans (Abs): 0 10*3/uL (ref 0.0–0.1)
Immature Granulocytes: 0 %
Lymphocytes Absolute: 0.8 10*3/uL (ref 0.7–3.1)
Lymphs: 16 %
MCH: 33.5 pg — ABNORMAL HIGH (ref 26.6–33.0)
MCHC: 34.3 g/dL (ref 31.5–35.7)
MCV: 98 fL — ABNORMAL HIGH (ref 79–97)
Monocytes Absolute: 0.4 10*3/uL (ref 0.1–0.9)
Monocytes: 7 %
Neutrophils Absolute: 3.9 10*3/uL (ref 1.4–7.0)
Neutrophils: 72 %
Platelets: 258 10*3/uL (ref 150–450)
RBC: 4.33 x10E6/uL (ref 3.77–5.28)
RDW: 13.1 % (ref 12.3–15.4)
WBC: 5.3 10*3/uL (ref 3.4–10.8)

## 2018-07-13 LAB — HEPATITIS C ANTIBODY: Hep C Virus Ab: <0.1 s/co ratio (ref 0.0–0.9)

## 2018-07-13 LAB — SEDIMENTATION RATE: Sed Rate: 8 mm/hr (ref 0–40)

## 2018-07-13 LAB — TSH: TSH: 2.3 u[IU]/mL (ref 0.450–4.500)

## 2018-07-13 LAB — T4, FREE: Free T4: 1.3 ng/dL (ref 0.82–1.77)

## 2018-09-08 ENCOUNTER — Telehealth: Payer: Self-pay

## 2018-09-08 DIAGNOSIS — Z598 Other problems related to housing and economic circumstances: Secondary | ICD-10-CM

## 2018-09-08 DIAGNOSIS — Z599 Problem related to housing and economic circumstances, unspecified: Secondary | ICD-10-CM

## 2018-09-08 NOTE — Telephone Encounter (Signed)
-----   Message from Gabriel CirriKaren E Curtis sent at 09/07/2018  4:40 PM EDT ----- Regarding: patient needs assistance with medication Patient has no ins and does not qualify for medicaid..  Can you provide any help  Thank you

## 2018-09-08 NOTE — Telephone Encounter (Signed)
Referral generated for connected care

## 2018-09-14 ENCOUNTER — Other Ambulatory Visit: Payer: Self-pay | Admitting: Unknown Physician Specialty

## 2018-09-14 NOTE — Telephone Encounter (Signed)
Patient called and advised it is noted that Singulair is no longer on her medication profile because it was discontinued at her last OV on 07/12/18 due to not taking for past 30 days. She says she takes it everyday. I advised I will submit refills.

## 2018-09-28 ENCOUNTER — Other Ambulatory Visit: Payer: Self-pay | Admitting: Unknown Physician Specialty

## 2018-10-27 ENCOUNTER — Telehealth: Payer: Self-pay | Admitting: Nurse Practitioner

## 2018-10-27 ENCOUNTER — Other Ambulatory Visit: Payer: Self-pay | Admitting: Unknown Physician Specialty

## 2018-10-27 ENCOUNTER — Other Ambulatory Visit: Payer: Self-pay | Admitting: Nurse Practitioner

## 2018-10-27 MED ORDER — LEVOTHYROXINE SODIUM 125 MCG PO TABS: 125.0000 ug | ORAL_TABLET | Freq: Every day | ORAL | 0 refills | Status: DC

## 2018-10-27 NOTE — Telephone Encounter (Signed)
Refill has been sent.  °

## 2018-10-27 NOTE — Progress Notes (Signed)
Patient requesting refill on Levothyroxine.  She has appointment coming up next month.  Refill sent to [facility] on [address]

## 2018-10-27 NOTE — Telephone Encounter (Signed)
Pt would like to see about getting a refill on Levothyroxine 125 tab. Pt set up appointment for next month and uses [address] [facility].

## 2018-10-28 ENCOUNTER — Telehealth: Payer: Self-pay | Admitting: Unknown Physician Specialty

## 2018-10-28 NOTE — Telephone Encounter (Signed)
Copied from CRM [id number]. Topic: Referral - Status >> Oct 28, 2018 12:33 PM Waldemar Dickens wrote: Called Pt and LVM regarding [employer] Referral for [employer].  c/b # [phone]  Manuela Schwartz Care Guide

## 2018-11-04 NOTE — Telephone Encounter (Signed)
Spoke with pt

## 2018-11-04 NOTE — Telephone Encounter (Signed)
Spoke with pt she is in the gap as she is 57 y/o and does not qualify for medicaid due to income level but isn't old enough to qualify for medicare. Will meet at the practice to apply for the healthcare.gov application to find affordable healthcare will notify Laurelyn SickleKatina about 11am [facility] knb

## 2018-11-05 ENCOUNTER — Other Ambulatory Visit: Payer: Self-pay

## 2018-11-05 ENCOUNTER — Emergency Department: Payer: Self-pay

## 2018-11-05 ENCOUNTER — Emergency Department
Admission: EM | Admit: 2018-11-05 | Discharge: 2018-11-05 | Disposition: A | Discharge status: Home / Self Care | Payer: Self-pay | Attending: Emergency Medicine | Admitting: Emergency Medicine

## 2018-11-05 ENCOUNTER — Encounter: Payer: Self-pay | Admitting: Intensive Care

## 2018-11-05 DIAGNOSIS — J209 Acute bronchitis, unspecified: Secondary | ICD-10-CM | POA: Insufficient documentation

## 2018-11-05 DIAGNOSIS — E039 Hypothyroidism, unspecified: Secondary | ICD-10-CM | POA: Insufficient documentation

## 2018-11-05 DIAGNOSIS — Z79899 Other long term (current) drug therapy: Secondary | ICD-10-CM | POA: Insufficient documentation

## 2018-11-05 DIAGNOSIS — Z87891 Personal history of nicotine dependence: Secondary | ICD-10-CM | POA: Insufficient documentation

## 2018-11-05 DIAGNOSIS — J45909 Unspecified asthma, uncomplicated: Secondary | ICD-10-CM | POA: Insufficient documentation

## 2018-11-05 LAB — CBC WITH DIFFERENTIAL/PLATELET
Abs Immature Granulocytes: 0.02 10*3/uL (ref 0.00–0.07)
Basophils Absolute: 0 10*3/uL (ref 0.0–0.1)
Basophils Relative: 1 %
Eosinophils Absolute: 0.3 10*3/uL (ref 0.0–0.5)
Eosinophils Relative: 8 %
HCT: 42 % (ref 36.0–46.0)
Hemoglobin: 13.7 g/dL (ref 12.0–15.0)
Immature Granulocytes: 1 %
Lymphocytes Relative: 18 %
Lymphs Abs: 0.8 10*3/uL (ref 0.7–4.0)
MCH: 31.6 pg (ref 26.0–34.0)
MCHC: 32.6 g/dL (ref 30.0–36.0)
MCV: 96.8 fL (ref 80.0–100.0)
Monocytes Absolute: 0.4 10*3/uL (ref 0.1–1.0)
Monocytes Relative: 8 %
Neutro Abs: 2.7 10*3/uL (ref 1.7–7.7)
Neutrophils Relative %: 64 %
Platelets: 269 10*3/uL (ref 150–400)
RBC: 4.34 MIL/uL (ref 3.87–5.11)
RDW: 12.7 % (ref 11.5–15.5)
WBC: 4.2 10*3/uL (ref 4.0–10.5)
nRBC: 0 % (ref 0.0–0.2)

## 2018-11-05 LAB — BLOOD GAS, VENOUS
Acid-Base Excess: 0.3 mmol/L (ref 0.0–2.0)
Bicarbonate: 24.6 mmol/L (ref 20.0–28.0)
O2 Saturation: 87.1 %
Patient temperature: 37
pCO2, Ven: 38 mmHg — ABNORMAL LOW (ref 44.0–60.0)
pH, Ven: 7.42 (ref 7.250–7.430)
pO2, Ven: 52 mmHg — ABNORMAL HIGH (ref 32.0–45.0)

## 2018-11-05 LAB — COMPREHENSIVE METABOLIC PANEL
ALT: 24 U/L (ref 0–44)
AST: 28 U/L (ref 15–41)
Albumin: 4.1 g/dL (ref 3.5–5.0)
Alkaline Phosphatase: 36 U/L — ABNORMAL LOW (ref 38–126)
Anion gap: 7 (ref 5–15)
BUN: 15 mg/dL (ref 6–20)
CO2: 24 mmol/L (ref 22–32)
Calcium: 9 mg/dL (ref 8.9–10.3)
Chloride: 110 mmol/L (ref 98–111)
Creatinine, Ser: 0.87 mg/dL (ref 0.44–1.00)
GFR calc Af Amer: >60 mL/min (ref >60)
GFR calc non Af Amer: >60 mL/min (ref >60)
Glucose, Bld: 83 mg/dL (ref 70–99)
Potassium: 4 mmol/L (ref 3.5–5.1)
Sodium: 141 mmol/L (ref 135–145)
Total Bilirubin: 0.7 mg/dL (ref 0.3–1.2)
Total Protein: 6.9 g/dL (ref 6.5–8.1)

## 2018-11-05 MED ORDER — ALBUTEROL SULFATE (2.5 MG/3ML) 0.083% IN NEBU
5.0000 mg | INHALATION_SOLUTION | Freq: Once | RESPIRATORY_TRACT | Status: AC
  Administered 2018-11-05: 5 mg via RESPIRATORY_TRACT
  Filled 2018-11-05: qty 6

## 2018-11-05 MED ORDER — GUAIFENESIN-CODEINE 100-10 MG/5ML PO SYRP: 5.0000 mL | ORAL_SOLUTION | Freq: Three times a day (TID) | ORAL | 0 refills | Status: DC | PRN

## 2018-11-05 MED ORDER — DOXYCYCLINE HYCLATE 100 MG PO CAPS: 100.0000 mg | ORAL_CAPSULE | Freq: Two times a day (BID) | ORAL | 0 refills | Status: DC

## 2018-11-05 MED ORDER — PREDNISONE 10 MG PO TABS: 50.0000 mg | ORAL_TABLET | Freq: Every day | ORAL | 0 refills | Status: DC

## 2018-11-05 MED ORDER — IPRATROPIUM-ALBUTEROL 0.5-2.5 (3) MG/3ML IN SOLN
3.0000 mL | Freq: Once | RESPIRATORY_TRACT | Status: AC
  Administered 2018-11-05: 3 mL via RESPIRATORY_TRACT
  Filled 2018-11-05: qty 3

## 2018-11-05 MED ORDER — METHYLPREDNISOLONE SODIUM SUCC 125 MG IJ SOLR
125.0000 mg | Freq: Once | INTRAMUSCULAR | Status: AC
  Administered 2018-11-05: 125 mg via INTRAVENOUS
  Filled 2018-11-05: qty 2

## 2018-11-05 NOTE — ED Provider Notes (Signed)
[facility] Emergency Department Provider Note  ____________________________________________   First MD Initiated Contact with Patient 11/05/18 [phone]     (approximate)  I have reviewed the triage vital signs and the nursing notes.   HISTORY  Chief Complaint Cough   HPI Audrey Richard is a 57 y.o. female presents to the emergency department for treatment and evaluation of cough and congestion for the past couple of weeks.  She has not had any fever.  She has a history of asthma and has been using her Ventolin without any relief.  She has had increasing shortness of breath over the past several hours.  She states that the cough is constant and persistent.   Past Medical History:  Diagnosis Date  . Asthma   . Depression   . High cholesterol   . Osteoporosis   . Thyroid disease     Patient Active Problem List   Diagnosis Date Noted  . Allergic conjunctivitis 07/12/2018  . Dyspareunia, female 12/08/2017  . Rhinitis 12/08/2017  . Osteopenia 06/11/2015  . Depression 06/11/2015  . Insomnia 06/11/2015  . Hip bursitis 06/11/2015  . Chronic pain 06/11/2015  . Asthma 06/11/2015  . Hypothyroidism 06/11/2015  . Hyperlipidemia 06/11/2015  . Acute anxiety 06/11/2015  . Hypercholesterolemia 06/11/2015    Past Surgical History:  Procedure Laterality Date  . TUBAL LIGATION      Prior to Admission medications   Medication Sig Start Date End Date Taking? Authorizing Provider  albuterol (PROVENTIL HFA;VENTOLIN HFA) 108 (90 Base) MCG/ACT inhaler Inhale 2 puffs into the lungs every 6 (six) hours as needed for wheezing or shortness of breath. 07/12/18   Gabriel Cirri, NP  azelastine (OPTIVAR) 0.05 % ophthalmic solution Place 1 drop into both eyes 2 (two) times daily. 07/12/18   Gabriel Cirri, NP  budesonide-formoterol [facility]) 160-4.5 MCG/ACT inhaler Inhale 2 puffs into the lungs 2 (two) times daily. 07/12/18   Gabriel Cirri, NP  buPROPion (WELLBUTRIN XL)  150 MG 24 hr tablet TAKE 1 TABLET BY MOUTH ONCE DAILY 03/03/18   Gabriel Cirri, NP  diphenhydrAMINE (BENADRYL) 25 mg capsule Take 25 mg by mouth every 6 (six) hours as needed.    [provider]  doxycycline (VIBRAMYCIN) 100 MG capsule Take 1 capsule (100 mg total) by mouth 2 (two) times daily. 11/05/18   Lindel Marcell, Rulon Eisenmenger B, FNP  FLUoxetine (PROZAC) 20 MG capsule TAKE 1 CAPSULE BY MOUTH ONCE DAILY 09/28/18   Gabriel Cirri, NP  guaiFENesin-codeine [facility]) 100-10 MG/5ML syrup Take 5 mLs by mouth 3 (three) times daily as needed for cough. 11/05/18   Creek Gan, Rulon Eisenmenger B, FNP  levothyroxine (SYNTHROID, LEVOTHROID) 125 MCG tablet Take 1 tablet (125 mcg total) by mouth daily. 10/27/18   Aura Dials T, NP  lovastatin (MEVACOR) 20 MG tablet TAKE 1 TABLET BY MOUTH AT BEDTIME 09/14/18   Osvaldo Angst M, PA-C  montelukast (SINGULAIR) 10 MG tablet TAKE 1 TABLET BY MOUTH AT BEDTIME 09/14/18   Osvaldo Angst M, PA-C  predniSONE (DELTASONE) 10 MG tablet Take 5 tablets (50 mg total) by mouth daily. 11/05/18   Kem Boroughs B, FNP  traZODone (DESYREL) 50 MG tablet TAKE 1 TABLET BY MOUTH AT BEDTIME AS NEEDED 02/15/18   Gabriel Cirri, NP    Allergies Biaxin [clarithromycin] and Iodine  Family History  Problem Relation Age of Onset  . Heart disease Mother   . Heart disease Father   . Diabetes Sister   . Diabetes Maternal Grandmother   . Heart disease  Maternal Grandmother   . Diabetes Maternal Grandfather   . Heart disease Maternal Grandfather     Social History Social History   Tobacco Use  . Smoking status: Former Smoker    Last attempt to quit: 11/15/2005    Years since quitting: 12.9  . Smokeless tobacco: Never Used  Substance Use Topics  . Alcohol use: Yes    Comment: pt states on occasion  . Drug use: No    Review of Systems  Constitutional: No fever/chills. Eyes: No visual changes. ENT: No sore throat. Cardiovascular: Positive for chest congestion. Negative for  pleuritic pain. Negative for palpitations. Negative for leg pain. Respiratory: Positive for shortness of breath. Gastrointestinal: Negative for abdominal pain. negative nausea, no vomiting.  No diarrhea.  No constipation. Genitourinary: Negative for dysuria. Musculoskeletal: Negative for back pain.  Skin: Negative for rash, lesion, wound. Neurological: Negative for headaches, focal weakness or numbness. ____________________________________________   PHYSICAL EXAM:  VITAL SIGNS: ED Triage Vitals  Enc Vitals Group     BP 11/05/18 0929 (!) 135/97     Pulse Rate 11/05/18 0929 86     Resp 11/05/18 0929 20     Temp 11/05/18 0929 98.2 F (36.8 C)     Temp Source 11/05/18 0929 Oral     SpO2 11/05/18 0929 96 %     Weight 11/05/18 0930 190 lb (86.2 kg)     Height 11/05/18 0930 5\' 7"  (1.702 m)     Head Circumference --      Peak Flow --      Pain Score 11/05/18 0930 10     Pain Loc --      Pain Edu? --      Excl. in GC? --     Constitutional: Alert and oriented. Acutely ill appearing. Speaking 4 sentences. Normal mental status. Eyes: Conjunctivae are normal. PERRL. Head: Atraumatic. Nose: No congestion/rhinnorhea. Mouth/Throat: Mucous membranes are moist.  Oropharynx non-erythematous. Tongue normal in size and color. Neck: No stridor. No carotid bruit appreciated on exam. Hematological/Lymphatic/Immunilogical: No cervical lymphadenopathy. Cardiovascular: Normal rate, regular rhythm. Grossly normal heart sounds.  Good peripheral circulation. Respiratory: Normal respiratory effort.  No retractions. Lung sounds diminished throughout. Gastrointestinal: Soft and nontender. No distention. No abdominal bruits. No CVA tenderness. Genitourinary: Exam deferred. Musculoskeletal: No lower extremity tenderness. No edema of extremities. Neurologic:  Normal speech and language. No gross focal neurologic deficits are appreciated. Skin:  Skin is warm, dry and intact. No rash noted. Psychiatric:  Mood and affect are normal. Speech and behavior are normal.  ____________________________________________   LABS (all labs ordered are listed, but only abnormal results are displayed)  Labs Reviewed  COMPREHENSIVE METABOLIC PANEL - Abnormal; Notable for the following components:      Result Value   Alkaline Phosphatase 36 (*)    All other components within normal limits  BLOOD GAS, VENOUS - Abnormal; Notable for the following components:   pCO2, Ven 38 (*)    pO2, Ven 52.0 (*)    All other components within normal limits  CBC WITH DIFFERENTIAL/PLATELET   ____________________________________________  EKG  Not indicated. ____________________________________________  RADIOLOGY  ED MD interpretation:  No pneumonia, CHF or acute cardiopulmonary findings.  Official radiology report(s): Dg Chest 2 View  Result Date: 11/05/2018 CLINICAL DATA:  Increasing shortness of breath for the past 2 weeks unrelieved by home inhaler. History of asthma. Nonsmoker. EXAM: CHEST - 2 VIEW COMPARISON:  PA and lateral chest x-ray of January 08, 2016 FINDINGS: The lungs are mildly  hyperinflated but clear. The heart and pulmonary vascularity are normal. The mediastinum is normal in width. There are old deformities of the posterolateral right seventh and eighth ribs. There is no pleural effusion. IMPRESSION: Mild hyperinflation consistent with reactive airway disease. No pneumonia, CHF, nor other acute cardiopulmonary abnormality. Electronically Signed   By: David  [place] M.D.   On: 11/05/2018 10:12    ____________________________________________   PROCEDURES  Procedure(s) performed: None  Procedures  Critical Care performed: No  ____________________________________________   INITIAL IMPRESSION / ASSESSMENT AND PLAN / ED COURSE  As part of my medical decision making, I reviewed the following data within the electronic MEDICAL RECORD NUMBER Notes from prior ED visits  57 year old female presenting  to the emergency department for treatment and evaluation of cough and shortness of breath unrelieved by her Ventolin.  Patient states that she used to be on Symbicort but has not been able to afford it.  No fever since the onset of symptoms 2 weeks ago.  ----------------------------------------- 10:46 AM on 11/05/2018 -----------------------------------------  Better air movement after albuterol treatment and solu medrol. DuoNeb ordered. VBG shows no acidosis.  ----------------------------------------- 11:55 AM on 11/05/2018 -----------------------------------------  Some improvement and air exchange has improved. Patient is requesting to go home. She is speaking full sentences without becoming short of breath. She was given strict ER return precautions.  ____________________________________________   FINAL CLINICAL IMPRESSION(S) / ED DIAGNOSES  Final diagnoses:  Acute bronchitis, unspecified organism     ED Discharge Orders         Ordered    guaiFENesin-codeine (ROBITUSSIN AC) 100-10 MG/5ML syrup  3 times daily PRN,   Status:  Discontinued     11/05/18 1201    predniSONE (DELTASONE) 10 MG tablet  Daily     11/05/18 1201    doxycycline (VIBRAMYCIN) 100 MG capsule  2 times daily     11/05/18 1201    guaiFENesin-codeine (ROBITUSSIN AC) 100-10 MG/5ML syrup  3 times daily PRN     11/05/18 1204           Note:  This document was prepared using Dragon voice recognition software and may include unintentional dictation errors.    Chinita Pester, FNP 11/05/18 1325    Governor Rooks, MD 11/05/18 [phone]

## 2018-11-05 NOTE — Discharge Instructions (Signed)
Please follow up with primary care.  Return to the ER for symptoms that change or worsen.

## 2018-11-05 NOTE — ED Notes (Signed)
See triage note  Presents with cough for couple of weeks  Denies any fever  States nasal congestion and sinus drainage  Wheezing noted on arrival

## 2018-11-05 NOTE — ED Triage Notes (Signed)
Patient c/o cough X2 weeks, congestion, and L ear pain. HX asthma.

## 2018-11-14 ENCOUNTER — Emergency Department: Payer: Self-pay

## 2018-11-14 ENCOUNTER — Other Ambulatory Visit: Payer: Self-pay

## 2018-11-14 ENCOUNTER — Encounter: Payer: Self-pay | Admitting: Emergency Medicine

## 2018-11-14 ENCOUNTER — Inpatient Hospital Stay
Admission: EM | Admit: 2018-11-14 | Discharge: 2018-11-17 | DRG: 190 | Disposition: A | Discharge status: Home / Self Care | Payer: Self-pay | Attending: Internal Medicine | Admitting: Internal Medicine

## 2018-11-14 DIAGNOSIS — E785 Hyperlipidemia, unspecified: Secondary | ICD-10-CM | POA: Diagnosis present

## 2018-11-14 DIAGNOSIS — E860 Dehydration: Secondary | ICD-10-CM | POA: Diagnosis present

## 2018-11-14 DIAGNOSIS — Z7951 Long term (current) use of inhaled steroids: Secondary | ICD-10-CM

## 2018-11-14 DIAGNOSIS — Z79890 Hormone replacement therapy (postmenopausal): Secondary | ICD-10-CM

## 2018-11-14 DIAGNOSIS — Z79899 Other long term (current) drug therapy: Secondary | ICD-10-CM

## 2018-11-14 DIAGNOSIS — J209 Acute bronchitis, unspecified: Secondary | ICD-10-CM | POA: Diagnosis present

## 2018-11-14 DIAGNOSIS — Z888 Allergy status to other drugs, medicaments and biological substances status: Secondary | ICD-10-CM

## 2018-11-14 DIAGNOSIS — J45901 Unspecified asthma with (acute) exacerbation: Secondary | ICD-10-CM | POA: Diagnosis present

## 2018-11-14 DIAGNOSIS — M810 Age-related osteoporosis without current pathological fracture: Secondary | ICD-10-CM | POA: Diagnosis present

## 2018-11-14 DIAGNOSIS — Z23 Encounter for immunization: Secondary | ICD-10-CM

## 2018-11-14 DIAGNOSIS — J449 Chronic obstructive pulmonary disease, unspecified: Secondary | ICD-10-CM | POA: Diagnosis present

## 2018-11-14 DIAGNOSIS — Z87891 Personal history of nicotine dependence: Secondary | ICD-10-CM

## 2018-11-14 DIAGNOSIS — N179 Acute kidney failure, unspecified: Secondary | ICD-10-CM | POA: Diagnosis not present

## 2018-11-14 DIAGNOSIS — J440 Chronic obstructive pulmonary disease with acute lower respiratory infection: Secondary | ICD-10-CM | POA: Diagnosis present

## 2018-11-14 DIAGNOSIS — J441 Chronic obstructive pulmonary disease with (acute) exacerbation: Principal | ICD-10-CM | POA: Diagnosis present

## 2018-11-14 DIAGNOSIS — Z881 Allergy status to other antibiotic agents status: Secondary | ICD-10-CM

## 2018-11-14 DIAGNOSIS — J9601 Acute respiratory failure with hypoxia: Secondary | ICD-10-CM | POA: Diagnosis present

## 2018-11-14 DIAGNOSIS — E039 Hypothyroidism, unspecified: Secondary | ICD-10-CM | POA: Diagnosis present

## 2018-11-14 LAB — CBC WITH DIFFERENTIAL/PLATELET
Abs Immature Granulocytes: 0.04 10*3/uL (ref 0.00–0.07)
Basophils Absolute: 0 10*3/uL (ref 0.0–0.1)
Basophils Relative: 0 %
Eosinophils Absolute: 0.4 10*3/uL (ref 0.0–0.5)
Eosinophils Relative: 6 %
HCT: 41.6 % (ref 36.0–46.0)
Hemoglobin: 14.1 g/dL (ref 12.0–15.0)
Immature Granulocytes: 1 %
Lymphocytes Relative: 20 %
Lymphs Abs: 1.3 10*3/uL (ref 0.7–4.0)
MCH: 32.3 pg (ref 26.0–34.0)
MCHC: 33.9 g/dL (ref 30.0–36.0)
MCV: 95.2 fL (ref 80.0–100.0)
Monocytes Absolute: 0.5 10*3/uL (ref 0.1–1.0)
Monocytes Relative: 7 %
Neutro Abs: 4.2 10*3/uL (ref 1.7–7.7)
Neutrophils Relative %: 66 %
Platelets: 287 10*3/uL (ref 150–400)
RBC: 4.37 MIL/uL (ref 3.87–5.11)
RDW: 13 % (ref 11.5–15.5)
WBC: 6.5 10*3/uL (ref 4.0–10.5)
nRBC: 0 % (ref 0.0–0.2)

## 2018-11-14 LAB — URINALYSIS, COMPLETE (UACMP) WITH MICROSCOPIC
Bacteria, UA: NONE SEEN
Bilirubin Urine: NEGATIVE
Glucose, UA: NEGATIVE mg/dL
Ketones, ur: NEGATIVE mg/dL
Leukocytes, UA: NEGATIVE
Nitrite: NEGATIVE
Protein, ur: NEGATIVE mg/dL
Specific Gravity, Urine: 1.014 (ref 1.005–1.030)
pH: 7 (ref 5.0–8.0)

## 2018-11-14 LAB — COMPREHENSIVE METABOLIC PANEL
ALT: 22 U/L (ref 0–44)
AST: 24 U/L (ref 15–41)
Albumin: 4.1 g/dL (ref 3.5–5.0)
Alkaline Phosphatase: 33 U/L — ABNORMAL LOW (ref 38–126)
Anion gap: 10 (ref 5–15)
BUN: 21 mg/dL — ABNORMAL HIGH (ref 6–20)
CO2: 25 mmol/L (ref 22–32)
Calcium: 9 mg/dL (ref 8.9–10.3)
Chloride: 104 mmol/L (ref 98–111)
Creatinine, Ser: 1.15 mg/dL — ABNORMAL HIGH (ref 0.44–1.00)
GFR calc Af Amer: >60 mL/min (ref >60)
GFR calc non Af Amer: 52 mL/min — ABNORMAL LOW (ref >60)
Glucose, Bld: 116 mg/dL — ABNORMAL HIGH (ref 70–99)
Potassium: 4.1 mmol/L (ref 3.5–5.1)
Sodium: 139 mmol/L (ref 135–145)
Total Bilirubin: 0.5 mg/dL (ref 0.3–1.2)
Total Protein: 6.8 g/dL (ref 6.5–8.1)

## 2018-11-14 LAB — CG4 I-STAT (LACTIC ACID)
Lactic Acid, Venous: 2 mmol/L (ref 0.5–1.9)
Lactic Acid, Venous: 1.84 mmol/L (ref 0.5–1.9)

## 2018-11-14 MED ORDER — ACETAMINOPHEN 650 MG RE SUPP: 650.0000 mg | Freq: Four times a day (QID) | RECTAL | Status: DC | PRN

## 2018-11-14 MED ORDER — LEVOTHYROXINE SODIUM 25 MCG PO TABS
125.0000 ug | ORAL_TABLET | Freq: Every day | ORAL | Status: DC
  Administered 2018-11-15 – 2018-11-17 (×3): 125 ug via ORAL
  Filled 2018-11-14 (×3): qty 1

## 2018-11-14 MED ORDER — GUAIFENESIN-DM 100-10 MG/5ML PO SYRP
5.0000 mL | ORAL_SOLUTION | ORAL | Status: DC | PRN
  Administered 2018-11-14 – 2018-11-17 (×7): 5 mL via ORAL
  Filled 2018-11-14 (×11): qty 5

## 2018-11-14 MED ORDER — IPRATROPIUM-ALBUTEROL 0.5-2.5 (3) MG/3ML IN SOLN
3.0000 mL | Freq: Once | RESPIRATORY_TRACT | Status: AC
  Administered 2018-11-14: 3 mL via RESPIRATORY_TRACT
  Filled 2018-11-14: qty 3

## 2018-11-14 MED ORDER — FLUOXETINE HCL 20 MG PO CAPS
20.0000 mg | ORAL_CAPSULE | Freq: Every day | ORAL | Status: DC
  Administered 2018-11-14 – 2018-11-17 (×4): 20 mg via ORAL
  Filled 2018-11-14 (×4): qty 1

## 2018-11-14 MED ORDER — SENNOSIDES-DOCUSATE SODIUM 8.6-50 MG PO TABS: 1.0000 | ORAL_TABLET | Freq: Every evening | ORAL | Status: DC | PRN

## 2018-11-14 MED ORDER — ENOXAPARIN SODIUM 40 MG/0.4ML ~~LOC~~ SOLN
40.0000 mg | SUBCUTANEOUS | Status: DC
  Administered 2018-11-14 – 2018-11-16 (×3): 40 mg via SUBCUTANEOUS
  Filled 2018-11-14 (×3): qty 0.4

## 2018-11-14 MED ORDER — METHYLPREDNISOLONE SODIUM SUCC 125 MG IJ SOLR
125.0000 mg | Freq: Once | INTRAMUSCULAR | Status: AC
  Administered 2018-11-14: 125 mg via INTRAVENOUS
  Filled 2018-11-14: qty 2

## 2018-11-14 MED ORDER — ONDANSETRON HCL 4 MG/2ML IJ SOLN: 4.0000 mg | Freq: Four times a day (QID) | INTRAMUSCULAR | Status: DC | PRN

## 2018-11-14 MED ORDER — IPRATROPIUM-ALBUTEROL 0.5-2.5 (3) MG/3ML IN SOLN
3.0000 mL | Freq: Four times a day (QID) | RESPIRATORY_TRACT | Status: DC
  Administered 2018-11-15 – 2018-11-17 (×11): 3 mL via RESPIRATORY_TRACT
  Filled 2018-11-14 (×11): qty 3

## 2018-11-14 MED ORDER — PNEUMOCOCCAL VAC POLYVALENT 25 MCG/0.5ML IJ INJ
0.5000 mL | INJECTION | INTRAMUSCULAR | Status: AC
  Administered 2018-11-15: 0.5 mL via INTRAMUSCULAR
  Filled 2018-11-14: qty 0.5

## 2018-11-14 MED ORDER — SODIUM CHLORIDE 0.9 % IV BOLUS
1000.0000 mL | Freq: Once | INTRAVENOUS | Status: AC
  Administered 2018-11-14: 1000 mL via INTRAVENOUS

## 2018-11-14 MED ORDER — ONDANSETRON HCL 4 MG PO TABS: 4.0000 mg | ORAL_TABLET | Freq: Four times a day (QID) | ORAL | Status: DC | PRN

## 2018-11-14 MED ORDER — ACETAMINOPHEN 325 MG PO TABS: 650.0000 mg | ORAL_TABLET | Freq: Four times a day (QID) | ORAL | Status: DC | PRN

## 2018-11-14 MED ORDER — BISACODYL 5 MG PO TBEC: 5.0000 mg | DELAYED_RELEASE_TABLET | Freq: Every day | ORAL | Status: DC | PRN

## 2018-11-14 MED ORDER — MOMETASONE FURO-FORMOTEROL FUM 200-5 MCG/ACT IN AERO
2.0000 | INHALATION_SPRAY | Freq: Two times a day (BID) | RESPIRATORY_TRACT | Status: DC
  Administered 2018-11-14 – 2018-11-17 (×6): 2 via RESPIRATORY_TRACT
  Filled 2018-11-14: qty 8.8

## 2018-11-14 MED ORDER — PRAVASTATIN SODIUM 20 MG PO TABS
20.0000 mg | ORAL_TABLET | Freq: Every day | ORAL | Status: DC
  Administered 2018-11-15 – 2018-11-16 (×2): 20 mg via ORAL
  Filled 2018-11-14 (×2): qty 1

## 2018-11-14 MED ORDER — SODIUM CHLORIDE 0.9% FLUSH
3.0000 mL | Freq: Two times a day (BID) | INTRAVENOUS | Status: DC
  Administered 2018-11-14 – 2018-11-17 (×6): 3 mL via INTRAVENOUS

## 2018-11-14 MED ORDER — SODIUM CHLORIDE 0.9 % IV SOLN: 250.0000 mL | INTRAVENOUS | Status: DC | PRN

## 2018-11-14 MED ORDER — TRAZODONE HCL 50 MG PO TABS: 50.0000 mg | ORAL_TABLET | Freq: Every evening | ORAL | Status: DC | PRN

## 2018-11-14 MED ORDER — INFLUENZA VAC SPLIT QUAD 0.5 ML IM SUSY
0.5000 mL | PREFILLED_SYRINGE | INTRAMUSCULAR | Status: AC
  Administered 2018-11-15: 0.5 mL via INTRAMUSCULAR
  Filled 2018-11-14: qty 0.5

## 2018-11-14 MED ORDER — BUPROPION HCL ER (XL) 150 MG PO TB24
150.0000 mg | ORAL_TABLET | Freq: Every day | ORAL | Status: DC
  Administered 2018-11-14 – 2018-11-17 (×4): 150 mg via ORAL
  Filled 2018-11-14 (×4): qty 1

## 2018-11-14 MED ORDER — MAGNESIUM SULFATE 2 GM/50ML IV SOLN
2.0000 g | Freq: Once | INTRAVENOUS | Status: AC
  Administered 2018-11-14: 2 g via INTRAVENOUS
  Filled 2018-11-14: qty 50

## 2018-11-14 MED ORDER — METHYLPREDNISOLONE SODIUM SUCC 125 MG IJ SOLR
60.0000 mg | Freq: Two times a day (BID) | INTRAMUSCULAR | Status: DC
  Administered 2018-11-14 – 2018-11-17 (×6): 60 mg via INTRAVENOUS
  Filled 2018-11-14 (×6): qty 2

## 2018-11-14 MED ORDER — HYDROCODONE-ACETAMINOPHEN 5-325 MG PO TABS
1.0000 | ORAL_TABLET | ORAL | Status: DC | PRN
  Administered 2018-11-14 – 2018-11-17 (×2): 1 via ORAL
  Filled 2018-11-14 (×2): qty 1

## 2018-11-14 MED ORDER — MONTELUKAST SODIUM 10 MG PO TABS
10.0000 mg | ORAL_TABLET | Freq: Every day | ORAL | Status: DC
  Administered 2018-11-14 – 2018-11-16 (×3): 10 mg via ORAL
  Filled 2018-11-14 (×3): qty 1

## 2018-11-14 MED ORDER — ALBUTEROL SULFATE (2.5 MG/3ML) 0.083% IN NEBU: 2.5000 mg | INHALATION_SOLUTION | RESPIRATORY_TRACT | Status: DC | PRN

## 2018-11-14 MED ORDER — SODIUM CHLORIDE 0.9% FLUSH: 3.0000 mL | INTRAVENOUS | Status: DC | PRN

## 2018-11-14 NOTE — H&P (Signed)
[facility] - [place] [facility]   PATIENT NAME: Audrey Richard    MR#:  [id number]  DATE OF BIRTH:  04/28/1961  DATE OF ADMISSION:  11/14/2018  PRIMARY CARE PHYSICIAN: Patient, No Pcp Per   REQUESTING/REFERRING PHYSICIAN: Nita SickleVeronese, [place], MD  CHIEF COMPLAINT:   Chief Complaint  Patient presents with  . Cough  . Shortness of Breath   Cough and shortness of breath for 10 days. HISTORY OF PRESENT ILLNESS:  Audrey Richard  is a 57 y.o. female with a known history of asthma, hyperlipidemia, osteoporosis, thyroid disease and depression.  The patient presents the ED with above chief complaints.  She has had shortness of breath and the dry cough and wheezing for the past 10 days.  She denies any fever or chills no chest pain but has chest tightness.  She denies any orthopnea or nocturnal dyspnea or leg edema.  She was given doxycycline 7 days and prednisone 5 days without improvement.  She is found mild hypoxia on exertion in the ED.  Chest x-ray show bronchitis.  ED physician request admission.  PAST MEDICAL HISTORY:   Past Medical History:  Diagnosis Date  . Asthma   . Depression   . High cholesterol   . Osteoporosis   . Thyroid disease     PAST SURGICAL HISTORY:   Past Surgical History:  Procedure Laterality Date  . TUBAL LIGATION      SOCIAL HISTORY:   Social History   Tobacco Use  . Smoking status: Former Smoker    Last attempt to quit: 11/15/2005    Years since quitting: 13.0  . Smokeless tobacco: Never Used  Substance Use Topics  . Alcohol use: Yes    Comment: pt states on occasion    FAMILY HISTORY:   Family History  Problem Relation Age of Onset  . Heart disease Mother   . Heart disease Father   . Diabetes Sister   . Diabetes Maternal Grandmother   . Heart disease Maternal Grandmother   . Diabetes Maternal Grandfather   . Heart disease Maternal Grandfather     DRUG ALLERGIES:   Allergies  Allergen Reactions  . Biaxin  [Clarithromycin]   . Iodine     REVIEW OF SYSTEMS:   Review of Systems  Constitutional: Negative for chills, fever and malaise/fatigue.  HENT: Positive for sore throat.   Eyes: Negative for blurred vision and double vision.  Respiratory: Positive for cough, shortness of breath and wheezing. Negative for hemoptysis, sputum production and stridor.   Cardiovascular: Negative for chest pain, palpitations, orthopnea and leg swelling.  Gastrointestinal: Negative for abdominal pain, blood in stool, diarrhea, melena, nausea and vomiting.  Genitourinary: Negative for dysuria, flank pain and hematuria.  Musculoskeletal: Negative for back pain and joint pain.  Skin: Negative for rash.  Neurological: Negative for dizziness, sensory change, focal weakness, seizures, loss of consciousness, weakness and headaches.  Endo/Heme/Allergies: Negative for polydipsia.  Psychiatric/Behavioral: Negative for depression. The patient is not nervous/anxious.     MEDICATIONS AT HOME:   Prior to Admission medications   Medication Sig Start Date End Date Taking? Authorizing Provider  albuterol (PROVENTIL HFA;VENTOLIN HFA) 108 (90 Base) MCG/ACT inhaler Inhale 2 puffs into the lungs every 6 (six) hours as needed for wheezing or shortness of breath. 07/12/18   Gabriel CirriWicker, [name], NP  azelastine (OPTIVAR) 0.05 % ophthalmic solution Place 1 drop into both eyes 2 (two) times daily. 07/12/18   Gabriel CirriWicker, [name], NP  budesonide-formoterol [facility](SYMBICORT) 160-4.5 MCG/ACT inhaler Inhale  2 puffs into the lungs 2 (two) times daily. 07/12/18   Gabriel Cirri, NP  buPROPion (WELLBUTRIN XL) 150 MG 24 hr tablet TAKE 1 TABLET BY MOUTH ONCE DAILY 03/03/18   Gabriel Cirri, NP  diphenhydrAMINE (BENADRYL) 25 mg capsule Take 25 mg by mouth every 6 (six) hours as needed.    [provider]  doxycycline (VIBRAMYCIN) 100 MG capsule Take 1 capsule (100 mg total) by mouth 2 (two) times daily. 11/05/18   Triplett, Rulon Eisenmenger B, FNP  FLUoxetine (PROZAC) 20  MG capsule TAKE 1 CAPSULE BY MOUTH ONCE DAILY 09/28/18   Gabriel Cirri, NP  guaiFENesin-codeine [facility]) 100-10 MG/5ML syrup Take 5 mLs by mouth 3 (three) times daily as needed for cough. 11/05/18   Triplett, Rulon Eisenmenger B, FNP  levothyroxine (SYNTHROID, LEVOTHROID) 125 MCG tablet Take 1 tablet (125 mcg total) by mouth daily. 10/27/18   Aura Dials T, NP  lovastatin (MEVACOR) 20 MG tablet TAKE 1 TABLET BY MOUTH AT BEDTIME 09/14/18   Osvaldo Angst M, PA-C  montelukast (SINGULAIR) 10 MG tablet TAKE 1 TABLET BY MOUTH AT BEDTIME 09/14/18   Osvaldo Angst M, PA-C  predniSONE (DELTASONE) 10 MG tablet Take 5 tablets (50 mg total) by mouth daily. 11/05/18   Kem Boroughs B, FNP  traZODone (DESYREL) 50 MG tablet TAKE 1 TABLET BY MOUTH AT BEDTIME AS NEEDED 02/15/18   Gabriel Cirri, NP      VITAL SIGNS:  Blood pressure (!) 128/98, pulse 78, temperature (!) 97.1 F (36.2 C), temperature source Oral, resp. rate (!) 23, height 5\' 7"  (1.702 m), weight 86.2 kg, SpO2 (!) 88 %.  PHYSICAL EXAMINATION:  Physical Exam  GENERAL:  57 y.o.-year-old patient lying in the bed with no acute distress.  EYES: Pupils equal, round, reactive to light and accommodation. No scleral icterus. Extraocular muscles intact.  HEENT: Head atraumatic, normocephalic. Oropharynx and nasopharynx clear.  NECK:  Supple, no jugular venous distention. No thyroid enlargement, no tenderness.  LUNGS: Diminished breath sounds bilaterally, wheezing and crackles. No use of accessory muscles of respiration.  CARDIOVASCULAR: S1, S2 normal. No murmurs, rubs, or gallops.  ABDOMEN: Soft, nontender, nondistended. Bowel sounds present. No organomegaly or mass.  EXTREMITIES: No pedal edema, cyanosis, or clubbing.  NEUROLOGIC: Cranial nerves II through XII are intact. Muscle strength 5/5 in all extremities. Sensation intact. Gait not checked.  PSYCHIATRIC: The patient is alert and oriented x 3.  SKIN: No obvious rash, lesion, or ulcer.    LABORATORY PANEL:   CBC Recent Labs  Lab 11/14/18 1605  WBC 6.5  HGB 14.1  HCT 41.6  PLT 287   ------------------------------------------------------------------------------------------------------------------  Chemistries  Recent Labs  Lab 11/14/18 1605  NA 139  K 4.1  CL 104  CO2 25  GLUCOSE 116*  BUN 21*  CREATININE 1.15*  CALCIUM 9.0  AST 24  ALT 22  ALKPHOS 33*  BILITOT 0.5   ------------------------------------------------------------------------------------------------------------------  Cardiac Enzymes No results for input(s): TROPONINI in the last 168 hours. ------------------------------------------------------------------------------------------------------------------  RADIOLOGY:  Dg Chest 2 View  Result Date: 11/14/2018 CLINICAL DATA:  Nonproductive cough.  Shortness of breath. EXAM: CHEST - 2 VIEW COMPARISON:  November 05, 2018 FINDINGS: No pneumothorax. The heart, hila, and mediastinum are normal. Mildly prominent lung markings. No focal infiltrate, nodule, or mass. Healed right rib fractures. No other acute abnormalities. IMPRESSION: Possible mild bronchitic change.  No focal infiltrate. Electronically Signed   By: Gerome Sam III M.D   On: 11/14/2018 17:18      IMPRESSION AND PLAN:  Hypoxia due to acute bronchitis and asthma exacerbation. The patient will be placed for observation. Continue IV steroid, DuoNeb every 6 hours, continue Singulair, start Dulera.  Robitussin as needed.  Mild dehydration.  Encourage oral fluid intake.  Hyperlipidemia.  Continue statin. All the records are reviewed and case discussed with ED provider. Management plans discussed with the patient, family and they are in agreement.  CODE STATUS: Full code  TOTAL TIME TAKING CARE OF THIS PATIENT: 35 minutes.    Shaune Pollack M.D on 11/14/2018 at 8:21 PM  Between 7am to 6pm - Pager - [phone]  After 6pm go to [email or web address] - Social research officer, government  [facility] [place] [facility]  Office  [phone]  CC: Primary care physician; Patient, No Pcp Per   Note: This dictation was prepared with Dragon dictation along with smaller phrase technology. Any transcriptional errors that result from this process are unin

## 2018-11-14 NOTE — ED Provider Notes (Signed)
[facility] Emergency Department Provider Note  ____________________________________________  Time seen: Approximately 4:50 PM  I have reviewed the triage vital signs and the nursing notes.   HISTORY  Chief Complaint Cough and Shortness of Breath   HPI Audrey Richard is a 57 y.o. female with a history of asthma and hypothyroidism who presents for evaluation of cough and shortness of breath. Patient reports 10 days of cough, wheezing, shortness of breath. No fever, chills, nausea, vomiting, chest pain, personal or family history of blood clots, recent travel immobilization, leg pain or swelling, hemoptysis, or exogenous hormones, or history of cancer.  Patient finished a 7-day course of doxycycline and 5-day course of prednisone with no significant relief on her symptoms. She is not a smoker. SOB is present with minimal exertion but resolves at rest.   Past Medical History:  Diagnosis Date  . Asthma   . Depression   . High cholesterol   . Osteoporosis   . Thyroid disease     Patient Active Problem List   Diagnosis Date Noted  . Allergic conjunctivitis 07/12/2018  . Dyspareunia, female 12/08/2017  . Rhinitis 12/08/2017  . Osteopenia 06/11/2015  . Depression 06/11/2015  . Insomnia 06/11/2015  . Hip bursitis 06/11/2015  . Chronic pain 06/11/2015  . Asthma 06/11/2015  . Hypothyroidism 06/11/2015  . Hyperlipidemia 06/11/2015  . Acute anxiety 06/11/2015  . Hypercholesterolemia 06/11/2015    Past Surgical History:  Procedure Laterality Date  . TUBAL LIGATION      Prior to Admission medications   Medication Sig Start Date End Date Taking? Authorizing Provider  albuterol (PROVENTIL HFA;VENTOLIN HFA) 108 (90 Base) MCG/ACT inhaler Inhale 2 puffs into the lungs every 6 (six) hours as needed for wheezing or shortness of breath. 07/12/18   Gabriel Cirri, NP  azelastine (OPTIVAR) 0.05 % ophthalmic solution Place 1 drop into both eyes 2 (two) times  daily. 07/12/18   Gabriel Cirri, NP  budesonide-formoterol [facility]) 160-4.5 MCG/ACT inhaler Inhale 2 puffs into the lungs 2 (two) times daily. 07/12/18   Gabriel Cirri, NP  buPROPion (WELLBUTRIN XL) 150 MG 24 hr tablet TAKE 1 TABLET BY MOUTH ONCE DAILY 03/03/18   Gabriel Cirri, NP  diphenhydrAMINE (BENADRYL) 25 mg capsule Take 25 mg by mouth every 6 (six) hours as needed.    [provider]  doxycycline (VIBRAMYCIN) 100 MG capsule Take 1 capsule (100 mg total) by mouth 2 (two) times daily. 11/05/18   Triplett, Rulon Eisenmenger B, FNP  FLUoxetine (PROZAC) 20 MG capsule TAKE 1 CAPSULE BY MOUTH ONCE DAILY 09/28/18   Gabriel Cirri, NP  guaiFENesin-codeine [facility]) 100-10 MG/5ML syrup Take 5 mLs by mouth 3 (three) times daily as needed for cough. 11/05/18   Triplett, Rulon Eisenmenger B, FNP  levothyroxine (SYNTHROID, LEVOTHROID) 125 MCG tablet Take 1 tablet (125 mcg total) by mouth daily. 10/27/18   Aura Dials T, NP  lovastatin (MEVACOR) 20 MG tablet TAKE 1 TABLET BY MOUTH AT BEDTIME 09/14/18   Osvaldo Angst M, PA-C  montelukast (SINGULAIR) 10 MG tablet TAKE 1 TABLET BY MOUTH AT BEDTIME 09/14/18   Osvaldo Angst M, PA-C  predniSONE (DELTASONE) 10 MG tablet Take 5 tablets (50 mg total) by mouth daily. 11/05/18   Kem Boroughs B, FNP  traZODone (DESYREL) 50 MG tablet TAKE 1 TABLET BY MOUTH AT BEDTIME AS NEEDED 02/15/18   Gabriel Cirri, NP    Allergies Biaxin [clarithromycin] and Iodine  Family History  Problem Relation Age of Onset  . Heart disease Mother   .  Heart disease Father   . Diabetes Sister   . Diabetes Maternal Grandmother   . Heart disease Maternal Grandmother   . Diabetes Maternal Grandfather   . Heart disease Maternal Grandfather     Social History Social History   Tobacco Use  . Smoking status: Former Smoker    Last attempt to quit: 11/15/2005    Years since quitting: 13.0  . Smokeless tobacco: Never Used  Substance Use Topics  . Alcohol use: Yes    Comment: pt states on  occasion  . Drug use: No    Review of Systems  Constitutional: Negative for fever. Eyes: Negative for visual changes. ENT: Negative for sore throat. Neck: No neck pain  Cardiovascular: Negative for chest pain. Respiratory: + shortness of breath, wheezing, cough Gastrointestinal: Negative for abdominal pain, vomiting or diarrhea. Genitourinary: Negative for dysuria. Musculoskeletal: Negative for back pain. Skin: Negative for rash. Neurological: Negative for headaches, weakness or numbness. Psych: No SI or HI  ____________________________________________   PHYSICAL EXAM:  VITAL SIGNS: ED Triage Vitals  Enc Vitals Group     BP 11/14/18 1552 (!) 142/69     Pulse Rate 11/14/18 1552 79     Resp 11/14/18 1552 18     Temp 11/14/18 1552 (!) 97.1 F (36.2 C)     Temp Source 11/14/18 1552 Oral     SpO2 11/14/18 1552 92 %     Weight 11/14/18 1553 190 lb (86.2 kg)     Height 11/14/18 1553 5\' 7"  (1.702 m)     Head Circumference --      Peak Flow --      Pain Score 11/14/18 1607 0     Pain Loc --      Pain Edu? --      Excl. in GC? --     Constitutional: Alert and oriented. Well appearing and in no apparent distress. HEENT:      Head: Normocephalic and atraumatic.         Eyes: Conjunctivae are normal. Sclera is non-icteric.       Mouth/Throat: Mucous membranes are moist.       Neck: Supple with no signs of meningismus. Cardiovascular: Regular rate and rhythm. No murmurs, gallops, or rubs. 2+ symmetrical distal pulses are present in all extremities. No JVD. Respiratory: Mild increased work of breathing, patient is very tight with decreased air movement and diffuse expiratory wheezes bilaterally Gastrointestinal: Soft, non tender, and non distended with positive bowel sounds. No rebound or guarding. Genitourinary: No CVA tenderness. Musculoskeletal: Nontender with normal range of motion in all extremities. No edema, cyanosis, or erythema of extremities. Neurologic: Normal speech  and language. Face is symmetric. Moving all extremities. No gross focal neurologic deficits are appreciated. Skin: Skin is warm, dry and intact. No rash noted. Psychiatric: Mood and affect are normal. Speech and behavior are normal.  ____________________________________________   LABS (all labs ordered are listed, but only abnormal results are displayed)  Labs Reviewed  COMPREHENSIVE METABOLIC PANEL - Abnormal; Notable for the following components:      Result Value   Glucose, Bld 116 (*)    BUN 21 (*)    Creatinine, Ser 1.15 (*)    Alkaline Phosphatase 33 (*)    GFR calc non Af Amer 52 (*)    All other components within normal limits  URINALYSIS, COMPLETE (UACMP) WITH MICROSCOPIC - Abnormal; Notable for the following components:   Color, Urine YELLOW (*)    APPearance CLEAR (*)  Hgb urine dipstick SMALL (*)    All other components within normal limits  CG4 I-STAT (LACTIC ACID) - Abnormal; Notable for the following components:   Lactic Acid, Venous 2.00 (*)    All other components within normal limits  CBC WITH DIFFERENTIAL/PLATELET  LACTIC ACID, PLASMA  LACTIC ACID, PLASMA   ____________________________________________  EKG  ED ECG REPORT I, Nita Sickle, the attending physician, personally viewed and interpreted this ECG.   16:00 -normal sinus rhythm, rate of 78, normal intervals, normal axis, no ST elevations or depressions, T wave inversions in aVL and V2. No prior for comparison  16:33 -normal sinus rhythm, rate of 79, normal intervals, normal axis, persistent T wave inversions in aVL and V2 with no ST elevations or depressions. ____________________________________________  RADIOLOGY  I have personally reviewed the images performed during this visit and I agree with the Radiologist's read.   Interpretation by Radiologist:  Dg Chest 2 View  Result Date: 11/14/2018 CLINICAL DATA:  Nonproductive cough.  Shortness of breath. EXAM: CHEST - 2 VIEW COMPARISON:   November 05, 2018 FINDINGS: No pneumothorax. The heart, hila, and mediastinum are normal. Mildly prominent lung markings. No focal infiltrate, nodule, or mass. Healed right rib fractures. No other acute abnormalities. IMPRESSION: Possible mild bronchitic change.  No focal infiltrate. Electronically Signed   By: Gerome Sam III M.D   On: 11/14/2018 17:18      ____________________________________________   PROCEDURES  Procedure(s) performed: None Procedures Critical Care performed: yes  CRITICAL CARE Performed by: Nita Sickle  ?  Total critical care time: 30 min  Critical care time was exclusive of separately billable procedures and treating other patients.  Critical care was necessary to treat or prevent imminent or life-threatening deterioration.  Critical care was time spent personally by me on the following activities: development of treatment plan with patient and/or surrogate as well as nursing, discussions with consultants, evaluation of patient's response to treatment, examination of patient, obtaining history from patient or surrogate, ordering and performing treatments and interventions, ordering and review of laboratory studies, ordering and review of radiographic studies, pulse oximetry and re-evaluation of patient's condition.  ____________________________________________   INITIAL IMPRESSION / ASSESSMENT AND PLAN / ED COURSE   57 y.o. female with a history of asthma and hypothyroidism who presents for evaluation of cough, wheezing,  and shortness of breath x 10 days. Unchanged after doxy and prednisone. No fever, normal WBC, patient is very tight on exam with decreased air movement and a significant expiratory wheezes throughout.  She is not hypoxic on room air.  Chest x-ray showing bronchitic changes with no infiltrate. Labs with no leukocytosis, borderline lactic.  EKG with no ischemic changes.  Troponin is negative.  Presentation concerning for viral URI-itis.   Low suspicion for PE with no tachypnea, tachycardia, hypoxia, or any other risk factors.  Will give DuoNeb's and Solu-Medrol and reassess.   Clinical Course as of Nov 15 1927  Wynelle Link Nov 14, 2018  1927 After 3 DuoNeb's patient is still slightly tachypneic.  With ambulation her oxygen drops to 88%.  That recovers when patient goes back to rest.  Due to new hypoxia and persistent tachypnea I will admit her to the hospitalist service for further evaluation.   [CV]    Clinical Course User Index [CV] Don Perking Washington, MD     As part of my medical decision making, I reviewed the following data within the electronic MEDICAL RECORD NUMBER Nursing notes reviewed and incorporated, Labs reviewed , EKG interpreted ,  Old EKG reviewed, Old chart reviewed, Radiograph reviewed , Discussed with admitting physician , Notes from prior ED visits and [place] Controlled Substance Database    Pertinent labs & imaging results that were available during my care of the patient were reviewed by me and considered in my medical decision making (see chart for details).    ____________________________________________   FINAL CLINICAL IMPRESSION(S) / ED DIAGNOSES  Final diagnoses:  Acute respiratory failure with hypoxia (HCC)  Exacerbation of asthma, unspecified asthma severity, unspecified whether persistent      NEW MEDICATIONS STARTED DURING THIS VISIT:  ED Discharge Orders    None       Note:  This document was prepared using Dragon voice recognition software and may include unintentional dictation errors.    Nita Sickle, MD 11/14/18 [phone]

## 2018-11-14 NOTE — ED Notes (Signed)
Urine sent at this time.

## 2018-11-14 NOTE — ED Triage Notes (Signed)
C/O cough and sob x 2 weeks.  STates has been seen through ED prior (within the last 2 weeks) and diagnosed with bronchitis.  Has completed Doxycycline and Guaifenesis-codeine.  States symptoms not improved.  Strong cough noted in triage, non productive.

## 2018-11-14 NOTE — ED Notes (Signed)
Pt with ambulatory room air pox of 88%.

## 2018-11-15 DIAGNOSIS — J449 Chronic obstructive pulmonary disease, unspecified: Secondary | ICD-10-CM | POA: Diagnosis present

## 2018-11-15 LAB — INFLUENZA PANEL BY PCR (TYPE A & B)
Influenza A By PCR: NEGATIVE
Influenza B By PCR: NEGATIVE

## 2018-11-15 MED ORDER — ALUM & MAG HYDROXIDE-SIMETH 200-200-20 MG/5ML PO SUSP
30.0000 mL | ORAL | Status: DC | PRN
  Administered 2018-11-15 – 2018-11-16 (×4): 30 mL via ORAL
  Filled 2018-11-15 (×4): qty 30

## 2018-11-15 MED ORDER — LEVOFLOXACIN 500 MG PO TABS
500.0000 mg | ORAL_TABLET | Freq: Every day | ORAL | Status: DC
  Administered 2018-11-15 – 2018-11-17 (×3): 500 mg via ORAL
  Filled 2018-11-15 (×3): qty 1

## 2018-11-15 MED ORDER — HYDROCOD POLST-CPM POLST ER 10-8 MG/5ML PO SUER
5.0000 mL | Freq: Two times a day (BID) | ORAL | Status: DC | PRN
  Administered 2018-11-15 – 2018-11-17 (×4): 5 mL via ORAL
  Filled 2018-11-15 (×4): qty 5

## 2018-11-15 NOTE — Progress Notes (Signed)
[facility] - [place] at [facility]   PATIENT NAME: Dot LanesBonnie Ferraris    MR#:  [id number]  DATE OF BIRTH:  01/08/1961  SUBJECTIVE:  CHIEF COMPLAINT:   Chief Complaint  Patient presents with  . Cough  . Shortness of Breath  coughing badly, still SOB REVIEW OF SYSTEMS:  Review of Systems  Constitutional: Negative for diaphoresis, fever, malaise/fatigue and weight loss.  HENT: Negative for ear discharge, ear pain, hearing loss, nosebleeds, sore throat and tinnitus.   Eyes: Negative for blurred vision and pain.  Respiratory: Positive for cough, shortness of breath and wheezing. Negative for hemoptysis.   Cardiovascular: Negative for chest pain, palpitations, orthopnea and leg swelling.  Gastrointestinal: Negative for abdominal pain, blood in stool, constipation, diarrhea, heartburn, nausea and vomiting.  Genitourinary: Negative for dysuria, frequency and urgency.  Musculoskeletal: Negative for back pain and myalgias.  Skin: Negative for itching and rash.  Neurological: Negative for dizziness, tingling, tremors, focal weakness, seizures, weakness and headaches.  Psychiatric/Behavioral: Negative for depression. The patient is not nervous/anxious.     DRUG ALLERGIES:   Allergies  Allergen Reactions  . Biaxin [Clarithromycin]   . Iodine    VITALS:  Blood pressure 128/69, pulse 94, temperature 98 F (36.7 C), temperature source Oral, resp. rate (!) 24, height 5\' 7"  (1.702 m), weight 86.2 kg, SpO2 93 %. PHYSICAL EXAMINATION:  Physical Exam  Constitutional: She is oriented to person, place, and time.  HENT:  Head: Normocephalic and atraumatic.  Eyes: Pupils are equal, round, and reactive to light. Conjunctivae and EOM are normal.  Neck: Normal range of motion. Neck supple. No tracheal deviation present. No thyromegaly present.  Cardiovascular: Normal rate, regular rhythm and normal heart sounds.  Pulmonary/Chest: Effort normal. No respiratory distress. She has  decreased breath sounds. She has wheezes. She exhibits no tenderness.  Abdominal: Soft. Bowel sounds are normal. She exhibits no distension. There is no tenderness.  Musculoskeletal: Normal range of motion.  Neurological: She is alert and oriented to person, place, and time. No cranial nerve deficit.  Skin: Skin is warm and dry. No rash noted.   LABORATORY PANEL:  Female CBC Recent Labs  Lab 11/14/18 1605  WBC 6.5  HGB 14.1  HCT 41.6  PLT 287   ------------------------------------------------------------------------------------------------------------------ Chemistries  Recent Labs  Lab 11/14/18 1605  NA 139  K 4.1  CL 104  CO2 25  GLUCOSE 116*  BUN 21*  CREATININE 1.15*  CALCIUM 9.0  AST 24  ALT 22  ALKPHOS 33*  BILITOT 0.5   RADIOLOGY:  Dg Chest 2 View  Result Date: 11/14/2018 CLINICAL DATA:  Nonproductive cough.  Shortness of breath. EXAM: CHEST - 2 VIEW COMPARISON:  November 05, 2018 FINDINGS: No pneumothorax. The heart, hila, and mediastinum are normal. Mildly prominent lung markings. No focal infiltrate, nodule, or mass. Healed right rib fractures. No other acute abnormalities. IMPRESSION: Possible mild bronchitic change.  No focal infiltrate. Electronically Signed   By: Gerome Samavid  Williams III M.D   On: 11/14/2018 17:18   ASSESSMENT AND PLAN:  [age] y f with Acute bronchitis  * Hypoxia due to acute bronchitis and asthma exacerbation. Continue IV steroid, DuoNeb every 6 hours, continue Singulair, start Dulera.  Robitussin as needed. - start empiric levaquin as coughing bad  * Mild dehydration.  Encourage oral fluid intake.  * Hyperlipidemia.  Continue statin.  * ARF- gentle hydration and monitor     All the records are reviewed and case discussed with Care Management/Social Worker. Management  plans discussed with the patient, nursing and they are in agreement.  CODE STATUS: Full Code  TOTAL TIME TAKING CARE OF THIS PATIENT: 35 minutes.   More than 50% of  the time was spent in counseling/coordination of care: YES  POSSIBLE D/C IN 1-2 DAYS, DEPENDING ON CLINICAL CONDITION.   Delfino Lovett M.D on 11/15/2018 at 4:36 PM  Between 7am to 6pm - Pager - [phone]  After 6pm go to [email or web address] - Social research officer, government  [facility]  Office  [phone]  CC: Primary care physician; Patient, No Pcp Per  Note: This dictation was prepared with Dragon dictation along with smaller phrase technology. Any transcriptional errors that result from this process are unintentional.

## 2018-11-15 NOTE — Progress Notes (Signed)
Chaplain responded to an OR for an AD. Pt was hoarse. [facility]  incarcerated. Pt said she wants her daughter to make Health care decision and to have a LW. Pt wants to discuss with family first and may have it done outside of hospital. Chaplain pffered prayer but Pt said she prays all the time and declined   11/15/18 0900  Clinical Encounter Type  Visited With Patient  Visit Type Initial  Referral From Nurse  Spiritual Encounters  Spiritual Needs Brochure

## 2018-11-16 LAB — BASIC METABOLIC PANEL
Anion gap: 7 (ref 5–15)
BUN: 23 mg/dL — ABNORMAL HIGH (ref 6–20)
CO2: 26 mmol/L (ref 22–32)
Calcium: 8.8 mg/dL — ABNORMAL LOW (ref 8.9–10.3)
Chloride: 106 mmol/L (ref 98–111)
Creatinine, Ser: 0.89 mg/dL (ref 0.44–1.00)
GFR calc Af Amer: >60 mL/min (ref >60)
GFR calc non Af Amer: >60 mL/min (ref >60)
Glucose, Bld: 159 mg/dL — ABNORMAL HIGH (ref 70–99)
Potassium: 5 mmol/L (ref 3.5–5.1)
Sodium: 139 mmol/L (ref 135–145)

## 2018-11-16 LAB — CBC
HCT: 41.7 % (ref 36.0–46.0)
Hemoglobin: 13.7 g/dL (ref 12.0–15.0)
MCH: 31.8 pg (ref 26.0–34.0)
MCHC: 32.9 g/dL (ref 30.0–36.0)
MCV: 96.8 fL (ref 80.0–100.0)
Platelets: 272 10*3/uL (ref 150–400)
RBC: 4.31 MIL/uL (ref 3.87–5.11)
RDW: 13.4 % (ref 11.5–15.5)
WBC: 14.6 10*3/uL — ABNORMAL HIGH (ref 4.0–10.5)
nRBC: 0 % (ref 0.0–0.2)

## 2018-11-16 LAB — HIV ANTIBODY (ROUTINE TESTING W REFLEX): HIV Screen 4th Generation wRfx: NONREACTIVE

## 2018-11-16 NOTE — Progress Notes (Signed)
[facility] - [place] at [facility]   PATIENT NAME: Audrey Richard    MR#:  [id number]  DATE OF BIRTH:  [birth year]  SUBJECTIVE:  CHIEF COMPLAINT:   Chief Complaint  Patient presents with  . Cough  . Shortness of Breath  Still coughing, was not able to sleep very well yesterday.  Short of breath REVIEW OF SYSTEMS:  Review of Systems  Constitutional: Negative for diaphoresis, fever, malaise/fatigue and weight loss.  HENT: Negative for ear discharge, ear pain, hearing loss, nosebleeds, sore throat and tinnitus.   Eyes: Negative for blurred vision and pain.  Respiratory: Positive for cough, shortness of breath and wheezing. Negative for hemoptysis.   Cardiovascular: Negative for chest pain, palpitations, orthopnea and leg swelling.  Gastrointestinal: Negative for abdominal pain, blood in stool, constipation, diarrhea, heartburn, nausea and vomiting.  Genitourinary: Negative for dysuria, frequency and urgency.  Musculoskeletal: Negative for back pain and myalgias.  Skin: Negative for itching and rash.  Neurological: Negative for dizziness, tingling, tremors, focal weakness, seizures, weakness and headaches.  Psychiatric/Behavioral: Negative for depression. The patient is not nervous/anxious.    DRUG ALLERGIES:   Allergies  Allergen Reactions  . Biaxin [Clarithromycin]   . Iodine    VITALS:  Blood pressure 114/67, pulse 76, temperature 97.6 F (36.4 C), temperature source Oral, resp. rate 20, height 5\' 7"  (1.702 m), weight 86.2 kg, SpO2 92 %. PHYSICAL EXAMINATION:  Physical Exam  Constitutional: She is oriented to person, place, and time.  HENT:  Head: Normocephalic and atraumatic.  Eyes: Pupils are equal, round, and reactive to light. Conjunctivae and EOM are normal.  Neck: Normal range of motion. Neck supple. No tracheal deviation present. No thyromegaly present.  Cardiovascular: Normal rate, regular rhythm and normal heart sounds.  Pulmonary/Chest:  Effort normal. No respiratory distress. She has decreased breath sounds. She has wheezes. She exhibits no tenderness.  Abdominal: Soft. Bowel sounds are normal. She exhibits no distension. There is no tenderness.  Musculoskeletal: Normal range of motion.  Neurological: She is alert and oriented to person, place, and time. No cranial nerve deficit.  Skin: Skin is warm and dry. No rash noted.   LABORATORY PANEL:  Female CBC Recent Labs  Lab 11/16/18 0333  WBC 14.6*  HGB 13.7  HCT 41.7  PLT 272   ------------------------------------------------------------------------------------------------------------------ Chemistries  Recent Labs  Lab 11/14/18 1605 11/16/18 0333  NA 139 139  K 4.1 5.0  CL 104 106  CO2 25 26  GLUCOSE 116* 159*  BUN 21* 23*  CREATININE 1.15* 0.89  CALCIUM 9.0 8.8*  AST 24  --   ALT 22  --   ALKPHOS 33*  --   BILITOT 0.5  --    RADIOLOGY:  No results found. ASSESSMENT AND PLAN:  [age] y f with Acute bronchitis  * Hypoxia due to acute bronchitis and asthma exacerbation. Continue IV steroids, DuoNeb every 6 hours, continue Singulair, Dulera.  Robitussin as needed. -Continue Levaquin empirically  * Mild dehydration: Improving with IV and oral hydration. Encourage oral fluid intake.  * Hyperlipidemia.  Continue statin.  * ARF-prerenal, improved with IV hydration     All the records are reviewed and case discussed with Care Management/Social Worker. Management plans discussed with the patient, nursing and they are in agreement.  CODE STATUS: Full Code  TOTAL TIME TAKING CARE OF THIS PATIENT: 35 minutes.   More than 50% of the time was spent in counseling/coordination of care: YES  POSSIBLE D/C IN 1 DAYS, DEPENDING  ON CLINICAL CONDITION.   Delfino Lovett M.D on 11/16/2018 at 9:23 AM  Between 7am to 6pm - Pager - [phone]  After 6pm go to [email or web address] - Social research officer, government  [facility] [place] [facility]  Office   [phone]  CC: Primary care physician; Patient, No Pcp Per  Note: This dictation was prepared with Dragon dictation along with smaller phrase technology. Any transcriptional errors that result from this process are unintentional.

## 2018-11-16 NOTE — Plan of Care (Signed)

## 2018-11-16 NOTE — Progress Notes (Signed)
Better day less coughing. Poss d/c home tomorrow. Up and about in room

## 2018-11-17 LAB — BASIC METABOLIC PANEL
Anion gap: 9 (ref 5–15)
BUN: 30 mg/dL — ABNORMAL HIGH (ref 6–20)
CO2: 27 mmol/L (ref 22–32)
Calcium: 9.6 mg/dL (ref 8.9–10.3)
Chloride: 102 mmol/L (ref 98–111)
Creatinine, Ser: 0.93 mg/dL (ref 0.44–1.00)
GFR calc Af Amer: >60 mL/min (ref >60)
GFR calc non Af Amer: >60 mL/min (ref >60)
Glucose, Bld: 159 mg/dL — ABNORMAL HIGH (ref 70–99)
Potassium: 5.2 mmol/L — ABNORMAL HIGH (ref 3.5–5.1)
Sodium: 138 mmol/L (ref 135–145)

## 2018-11-17 LAB — CBC
HCT: 45.5 % (ref 36.0–46.0)
Hemoglobin: 14.5 g/dL (ref 12.0–15.0)
MCH: 31.5 pg (ref 26.0–34.0)
MCHC: 31.9 g/dL (ref 30.0–36.0)
MCV: 98.9 fL (ref 80.0–100.0)
Platelets: 287 10*3/uL (ref 150–400)
RBC: 4.6 MIL/uL (ref 3.87–5.11)
RDW: 13.6 % (ref 11.5–15.5)
WBC: 14.7 10*3/uL — ABNORMAL HIGH (ref 4.0–10.5)
nRBC: 0 % (ref 0.0–0.2)

## 2018-11-17 MED ORDER — GUAIFENESIN-CODEINE 100-10 MG/5ML PO SYRP: 5.0000 mL | ORAL_SOLUTION | Freq: Three times a day (TID) | ORAL | 0 refills | Status: DC | PRN

## 2018-11-17 MED ORDER — BUDESONIDE-FORMOTEROL FUMARATE 160-4.5 MCG/ACT IN AERO: 2.0000 | INHALATION_SPRAY | Freq: Two times a day (BID) | RESPIRATORY_TRACT | 3 refills | Status: DC

## 2018-11-17 MED ORDER — ALBUTEROL SULFATE HFA 108 (90 BASE) MCG/ACT IN AERS: 2.0000 | INHALATION_SPRAY | Freq: Four times a day (QID) | RESPIRATORY_TRACT | 2 refills | Status: DC | PRN

## 2018-11-17 MED ORDER — PREDNISONE 10 MG (21) PO TBPK: ORAL_TABLET | ORAL | 0 refills | Status: DC

## 2018-11-17 MED ORDER — IPRATROPIUM-ALBUTEROL 0.5-2.5 (3) MG/3ML IN SOLN: 3.0000 mL | Freq: Four times a day (QID) | RESPIRATORY_TRACT | 0 refills | Status: DC | PRN

## 2018-11-17 MED ORDER — AMOXICILLIN-POT CLAVULANATE 875-125 MG PO TABS: 1.0000 | ORAL_TABLET | Freq: Two times a day (BID) | ORAL | 0 refills | Status: DC

## 2018-11-17 NOTE — Discharge Summary (Signed)
[facility]   PATIENT NAME: Audrey Richard    MR#:  [id number]  DATE OF BIRTH:  Nov 15, 1961  DATE OF ADMISSION:  11/14/2018   ADMITTING PHYSICIAN: Shaune Pollack, MD  DATE OF DISCHARGE: 11/17/2018  3:10 PM  PRIMARY CARE PHYSICIAN: Aura Dials T, NP   ADMISSION DIAGNOSIS:  Acute respiratory failure with hypoxia (HCC) [J96.01] Exacerbation of asthma, unspecified asthma severity, unspecified whether persistent [J45.901] DISCHARGE DIAGNOSIS:  Active Problems:   Acute bronchitis   COPD exacerbation (HCC)  SECONDARY DIAGNOSIS:   Past Medical History:  Diagnosis Date  . Asthma   . Depression   . High cholesterol   . Osteoporosis   . Thyroid disease    HOSPITAL COURSE:  3 y f with Acute bronchitis  * Hypoxia due to acute bronchitis and asthma exacerbation.- Hypoxia now resolved - improving with empiric Abx, steroids and nebs  * Mild dehydration: Resolved  * Hyperlipidemia. Continue statin.  * ARF-prerenal, improved with IV hydration DISCHARGE CONDITIONS:  stable CONSULTS OBTAINED:   DRUG ALLERGIES:   Allergies  Allergen Reactions  . Biaxin [Clarithromycin]   . Iodine    DISCHARGE MEDICATIONS:   Allergies as of 11/17/2018      Reactions   Biaxin [clarithromycin]    Iodine       Medication List    STOP taking these medications   doxycycline 100 MG capsule Commonly known as:  VIBRAMYCIN   predniSONE 10 MG tablet Commonly known as:  DELTASONE Replaced by:  predniSONE 10 MG (21) Tbpk tablet     TAKE these medications   albuterol 108 (90 Base) MCG/ACT inhaler Commonly known as:  PROVENTIL HFA;VENTOLIN HFA Inhale 2 puffs into the lungs every 6 (six) hours as needed for wheezing or shortness of breath.   amoxicillin-clavulanate 875-125 MG tablet Commonly known as:  AUGMENTIN Take 1 tablet by mouth every 12 (twelve) hours for 7 days.   azelastine 0.05 % ophthalmic solution Commonly known as:   OPTIVAR Place 1 drop into both eyes 2 (two) times daily.   budesonide-formoterol 160-4.5 MCG/ACT inhaler Commonly known as:  SYMBICORT Inhale 2 puffs into the lungs 2 (two) times daily.   buPROPion 150 MG 24 hr tablet Commonly known as:  WELLBUTRIN XL TAKE 1 TABLET BY MOUTH ONCE DAILY   diphenhydrAMINE 25 mg capsule Commonly known as:  BENADRYL Take 25 mg by mouth every 6 (six) hours as needed.   FLUoxetine 20 MG capsule Commonly known as:  PROZAC TAKE 1 CAPSULE BY MOUTH ONCE DAILY   guaiFENesin-codeine 100-10 MG/5ML syrup Commonly known as:  ROBITUSSIN AC Take 5 mLs by mouth 3 (three) times daily as needed for cough.   ipratropium-albuterol 0.5-2.5 (3) MG/3ML Soln Commonly known as:  DUONEB Take 3 mLs by nebulization every 6 (six) hours as needed.   levothyroxine 125 MCG tablet Commonly known as:  SYNTHROID, LEVOTHROID Take 1 tablet (125 mcg total) by mouth daily.   lovastatin 20 MG tablet Commonly known as:  MEVACOR TAKE 1 TABLET BY MOUTH AT BEDTIME   montelukast 10 MG tablet Commonly known as:  SINGULAIR TAKE 1 TABLET BY MOUTH AT BEDTIME   predniSONE 10 MG (21) Tbpk tablet Commonly known as:  STERAPRED UNI-PAK 21 TAB 60 mg once daily, taper 10 mg daily until done Replaces:  predniSONE 10 MG tablet   traZODone 50 MG tablet Commonly known as:  DESYREL TAKE 1 TABLET BY MOUTH AT BEDTIME AS NEEDED      DISCHARGE  INSTRUCTIONS:   DIET:  Regular diet DISCHARGE CONDITION:  Good ACTIVITY:  Activity as tolerated OXYGEN:  Home Oxygen: No.  Oxygen Delivery: room air DISCHARGE LOCATION:  home   If you experience worsening of your admission symptoms, develop shortness of breath, life threatening emergency, suicidal or homicidal thoughts you must seek medical attention immediately by calling 911 or calling your MD immediately  if symptoms less severe.  You Must read complete instructions/literature along with all the possible adverse reactions/side effects for all  the Medicines you take and that have been prescribed to you. Take any new Medicines after you have completely understood and accpet all the possible adverse reactions/side effects.   Please note  You were cared for by a hospitalist during your hospital stay. If you have any questions about your discharge medications or the care you received while you were in the hospital after you are discharged, you can call the unit and asked to speak with the hospitalist on call if the hospitalist that took care of you is not available. Once you are discharged, your primary care physician will handle any further medical issues. Please note that NO REFILLS for any discharge medications will be authorized once you are discharged, as it is imperative that you return to your primary care physician (or establish a relationship with a primary care physician if you do not have one) for your aftercare needs so that they can reassess your need for medications and monitor your lab values.    On the day of Discharge:  VITAL SIGNS:  Blood pressure 126/82, pulse 94, temperature 98.6 F (37 C), temperature source Oral, resp. rate 18, height 5\' 7"  (1.702 m), weight 86.2 kg, SpO2 95 %. PHYSICAL EXAMINATION:  GENERAL:  57 y.o.-year-old patient lying in the bed with no acute distress.  EYES: Pupils equal, round, reactive to light and accommodation. No scleral icterus. Extraocular muscles intact.  HEENT: Head atraumatic, normocephalic. Oropharynx and nasopharynx clear.  NECK:  Supple, no jugular venous distention. No thyroid enlargement, no tenderness.  LUNGS: Normal breath sounds bilaterally, no wheezing, rales,rhonchi or crepitation. No use of accessory muscles of respiration.  CARDIOVASCULAR: S1, S2 normal. No murmurs, rubs, or gallops.  ABDOMEN: Soft, non-tender, non-distended. Bowel sounds present. No organomegaly or mass.  EXTREMITIES: No pedal edema, cyanosis, or clubbing.  NEUROLOGIC: Cranial nerves II through XII are  intact. Muscle strength 5/5 in all extremities. Sensation intact. Gait not checked.  PSYCHIATRIC: The patient is alert and oriented x 3.  SKIN: No obvious rash, lesion, or ulcer.  DATA REVIEW:   CBC Recent Labs  Lab 11/17/18 0256  WBC 14.7*  HGB 14.5  HCT 45.5  PLT 287    Chemistries  Recent Labs  Lab 11/14/18 1605  11/17/18 0256  NA 139   < > 138  K 4.1   < > 5.2*  CL 104   < > 102  CO2 25   < > 27  GLUCOSE 116*   < > 159*  BUN 21*   < > 30*  CREATININE 1.15*   < > 0.93  CALCIUM 9.0   < > 9.6  AST 24  --   --   ALT 22  --   --   ALKPHOS 33*  --   --   BILITOT 0.5  --   --    < > = values in this interval not displayed.     Follow-up Information    Merwyn KatosSimonds, David B, MD. Go on 11/26/2018.  Specialty:  Pulmonary Disease Why:  @10 :30 AM Contact information: [address] [place] [address] [phone]        [facility]. Schedule an appointment as soon as possible for a visit in 1 week.   Why:  Patient to self schedule // Needs refferal without insurance by primary care   Contact information: [address] [place] [address] [phone]       Marjie Skiff, NP. Go on 11/24/2018.   Specialty:  Nurse Practitioner Why:  @8 :45 AM Contact information: [address] [place] [address] [phone]            Management plans discussed with the patient, nursing and they are in agreement.  CODE STATUS: Prior   TOTAL TIME TAKING CARE OF THIS PATIENT: 45 minutes.    Delfino Lovett M.D on 11/17/2018 at 8:13 PM  Between 7am to 6pm - Pager - [phone]  After 6pm go to [email or web address] - Social research officer, government  [facility]  Office  [phone]  CC: Primary care physician; Marjie Skiff, NP   Note: This dictation was prepared with Dragon dictation along with smaller phrase technology. Any transcriptional errors that result from this process are unintentional.

## 2018-11-17 NOTE — Discharge Instructions (Signed)

## 2018-11-17 NOTE — Progress Notes (Signed)
Pt is being discharged home. Discharge papers given and explained to pt.  Pt verbalized understanding.  Meds and f/u appointments reviewed. Rx given. Work note given. Nebulizer at the bedside.  Awaiting transportation.

## 2018-11-17 NOTE — Care Management (Signed)
RNCM spoke with patient regarding medications, nebulizer, and no insurance She would appreciate any help she can get.  Nebulizer requested from MD and Brad with [facility]. Referral to [facility] and Medications management.

## 2018-11-19 ENCOUNTER — Telehealth: Payer: Self-pay

## 2018-11-19 DIAGNOSIS — Z599 Problem related to housing and economic circumstances, unspecified: Secondary | ICD-10-CM

## 2018-11-19 DIAGNOSIS — Z598 Other problems related to housing and economic circumstances: Secondary | ICD-10-CM

## 2018-11-19 NOTE — Telephone Encounter (Signed)
Transition Care Management Follow-up Telephone Call  Date of discharge and from where: [facility]17[facility]  How have you been since you were released from the hospital? Pt states she is doing okay but still feels weak. She is having occasional shortness of breath but has a nebulizer she is using for breathing treatments that helps and taking antibiotics and prednisone. States she was unable to get cough medication with codeine in it because her prescription was torn and the pharmacy would not accept it. Pt is taking OTC cough meds with some improvement.  Any questions or concerns? Yes   Pt states she does not have insurance right now and would not be able to afford upcoming hospital follow ups with with Aura DialsJolene Cannady and Dr. Sung AmabileSimonds for pulmonology. Advised pt to contact office regarding billing questions. I will also send a referral to our care guide for assistance with insurance and financial needs.    Items Reviewed:  Did the pt receive and understand the discharge instructions provided? Yes   Medications obtained and verified? Yes   Any new allergies since your discharge? No   Dietary orders reviewed? Yes  Do you have support at home? Yes  daughter lives with her  Functional Questionnaire: (I = Independent and D = Dependent) ADLs: I  Bathing/Dressing- I  Meal Prep- I  Eating- I  Maintaining continence- I  Transferring/Ambulation- I  Managing Meds- I  Follow up appointments reviewed:   PCP Hospital f/u appt confirmed? Yes  Scheduled to see Aura DialsJolene Cannady on 11/24/18 @ 8:45.  Specialist Hospital f/u appt confirmed? Yes  Scheduled to see Dr. Sung AmabileSimonds on 11/26/18 @ 10:30.  Are transportation arrangements needed? No   If their condition worsens, is the pt aware to call PCP or go to the Emergency Dept.? Yes  Was the patient provided with contact information for the PCP's office or ED? Yes  Was to pt encouraged to call back with questions or concerns? Yes

## 2018-11-22 ENCOUNTER — Telehealth: Payer: Self-pay | Admitting: Nurse Practitioner

## 2018-11-22 NOTE — Telephone Encounter (Signed)
Copied from CRM [phone]. Topic: Referral - Status >> Nov 22, 2018 10:24 AM Audrey Richard, Kathryn N wrote: Spoke to pt to follow up on help with insurance, she was not available to talk and asked that I try her back this afternoon. Will c/b after lunchtime knb

## 2018-11-23 ENCOUNTER — Telehealth: Payer: Self-pay

## 2018-11-23 NOTE — Telephone Encounter (Signed)
EMMI Follow-up: Noted on the report that the patient hadn't read her discharge paperwork yet and didn't have any follow-up appointments.  I talked with Audrey Richard and she said she was doing fine.  I asked if she had read over her After Visit Summary (AVS) yet as it has her follow-up appointments listed and she seemed to understand she had an appointment on Wednesday morning. I reminded her to reference her AVS for other future appointments. No other needs noted.

## 2018-11-24 ENCOUNTER — Other Ambulatory Visit: Payer: Self-pay

## 2018-11-24 ENCOUNTER — Encounter: Payer: Self-pay | Admitting: Nurse Practitioner

## 2018-11-24 ENCOUNTER — Ambulatory Visit (INDEPENDENT_AMBULATORY_CARE_PROVIDER_SITE_OTHER): Payer: Self-pay | Admitting: Nurse Practitioner

## 2018-11-24 VITALS — BP 114/76 | HR 80 | Temp 98.0°F | Ht 66.0 in | Wt 191.0 lb

## 2018-11-24 DIAGNOSIS — J441 Chronic obstructive pulmonary disease with (acute) exacerbation: Secondary | ICD-10-CM

## 2018-11-24 MED ORDER — IPRATROPIUM-ALBUTEROL 0.5-2.5 (3) MG/3ML IN SOLN: 3.0000 mL | Freq: Four times a day (QID) | RESPIRATORY_TRACT | 0 refills | Status: DC | PRN

## 2018-11-24 NOTE — Patient Instructions (Signed)

## 2018-11-24 NOTE — Progress Notes (Signed)
BP 114/76   Pulse 80   Temp 98 F (36.7 C) (Oral)   Ht 5\' 6"  (1.676 m)   Wt 191 lb (86.6 kg)   LMP  (LMP Unknown)   SpO2 96%   BMI 30.83 kg/m    Subjective:    Patient ID: Audrey Richard, female    DOB: [birth year], 57 y.o.   MRN: [id number]  HPI: Audrey Richard is a 57 y.o. female presents for hospital follow-up  Chief Complaint  Patient presents with  . Hospitalization Follow-up    pt states went to the hospital with respiratory problems   ER FOLLOW UP  She finished her last dose of abx yesterday and Prednisone.  She has not been seen by pulmonary before and has appointment set-up to see them.  She is not a current smoker (smoked years ago, off/on, quit 20 years ago), her daughter smokes but smokes outside.  No history of asthma as child.  Overall she reports feeling better, although continues to have some hoarseness.  Denies SOB, CP, cough, fever, nasal congestion, wheezing.  Using Duonebs at home occasionally, which she reports helps.  Continues daily maintenance inhaler. Time since discharge: Was admitted from 11/14/18 to 11/17/18 for COPD exacerbation and bronchitis Hospital/facility: [facility] Diagnosis: COPD exacerbation and bronchitis, ARF (prerenal), and mild dehydration Procedures/tests: 11/17/18 WBC 14.7 and K+ 5.2 + BUN 30 Consultants: She sees Dr. Sung Amabile 11/26/18 at 1030 New medications: Duoneb, Proventil Discharge instructions: She is to follow-up with pulmonary and PCP.  Status: better   Relevant past medical, surgical, family and social history reviewed and updated as indicated. Interim medical history since our last visit reviewed. Allergies and medications reviewed and updated.  Review of Systems  Constitutional: Negative for activity change, appetite change, diaphoresis, fatigue and fever.  HENT: Negative.   Eyes: Negative.   Respiratory: Negative for cough, chest tightness, shortness of breath and wheezing.        Has hoarseness  Cardiovascular:  Negative for chest pain, palpitations and leg swelling.  Gastrointestinal: Negative for abdominal distention, abdominal pain, constipation, diarrhea, nausea and vomiting.  Endocrine: Negative.   Neurological: Negative for dizziness, syncope, weakness, light-headedness, numbness and headaches.  Psychiatric/Behavioral: Negative.     Per HPI unless specifically indicated above     Objective:    BP 114/76   Pulse 80   Temp 98 F (36.7 C) (Oral)   Ht 5\' 6"  (1.676 m)   Wt 191 lb (86.6 kg)   LMP  (LMP Unknown)   SpO2 96%   BMI 30.83 kg/m   Wt Readings from Last 3 Encounters:  11/24/18 191 lb (86.6 kg)  11/14/18 190 lb (86.2 kg)  11/05/18 190 lb (86.2 kg)    Physical Exam  Constitutional: She is oriented to person, place, and time. She appears well-developed and well-nourished.  HENT:  Head: Normocephalic.  Right Ear: External ear normal.  Left Ear: External ear normal.  Nose: Nose normal.  Mouth/Throat: Oropharynx is clear and moist.  Eyes: Pupils are equal, round, and reactive to light. Conjunctivae and EOM are normal. Right eye exhibits no discharge. Left eye exhibits no discharge.  Neck: Normal range of motion. Neck supple. No JVD present. Carotid bruit is not present. No thyromegaly present.  Cardiovascular: Normal rate, regular rhythm and normal heart sounds.  Pulmonary/Chest: Effort normal and breath sounds normal. No accessory muscle usage. She has no wheezes.  Lungs clear throughout.  Hoarseness is present.  Abdominal: Soft. Bowel sounds are normal.  Lymphadenopathy:  She has no cervical adenopathy.  Neurological: She is alert and oriented to person, place, and time.  Skin: Skin is warm and dry.  Psychiatric: She has a normal mood and affect. Her behavior is normal. Judgment and thought content normal.  Nursing note and vitals reviewed.   Results for orders placed or performed during the hospital encounter of 11/14/18  Comprehensive metabolic panel  Result Value  Ref Range   Sodium 139 135 - 145 mmol/L   Potassium 4.1 3.5 - 5.1 mmol/L   Chloride 104 98 - 111 mmol/L   CO2 25 22 - 32 mmol/L   Glucose, Bld 116 (H) 70 - 99 mg/dL   BUN 21 (H) 6 - 20 mg/dL   Creatinine, Ser 3.291.15 (H) 0.44 - 1.00 mg/dL   Calcium 9.0 8.9 - 51.810.3 mg/dL   Total Protein 6.8 6.5 - 8.1 g/dL   Albumin 4.1 3.5 - 5.0 g/dL   AST 24 15 - 41 U/L   ALT 22 0 - 44 U/L   Alkaline Phosphatase 33 (L) 38 - 126 U/L   Total Bilirubin 0.5 0.3 - 1.2 mg/dL   GFR calc non Af Amer 52 (L) >60 mL/min   GFR calc Af Amer >60 >60 mL/min   Anion gap 10 5 - 15  CBC with Differential  Result Value Ref Range   WBC 6.5 4.0 - 10.5 K/uL   RBC 4.37 3.87 - 5.11 MIL/uL   Hemoglobin 14.1 12.0 - 15.0 g/dL   HCT 84.141.6 66.036.0 - 63.046.0 %   MCV 95.2 80.0 - 100.0 fL   MCH 32.3 26.0 - 34.0 pg   MCHC 33.9 30.0 - 36.0 g/dL   RDW 16.013.0 10.911.5 - 32.315.5 %   Platelets 287 150 - 400 K/uL   nRBC 0.0 0.0 - 0.2 %   Neutrophils Relative % 66 %   Neutro Abs 4.2 1.7 - 7.7 K/uL   Lymphocytes Relative 20 %   Lymphs Abs 1.3 0.7 - 4.0 K/uL   Monocytes Relative 7 %   Monocytes Absolute 0.5 0.1 - 1.0 K/uL   Eosinophils Relative 6 %   Eosinophils Absolute 0.4 0.0 - 0.5 K/uL   Basophils Relative 0 %   Basophils Absolute 0.0 0.0 - 0.1 K/uL   Immature Granulocytes 1 %   Abs Immature Granulocytes 0.04 0.00 - 0.07 K/uL  Urinalysis, Complete w Microscopic  Result Value Ref Range   Color, Urine YELLOW (A) YELLOW   APPearance CLEAR (A) CLEAR   Specific Gravity, Urine 1.014 1.005 - 1.030   pH 7.0 5.0 - 8.0   Glucose, UA NEGATIVE NEGATIVE mg/dL   Hgb urine dipstick SMALL (A) NEGATIVE   Bilirubin Urine NEGATIVE NEGATIVE   Ketones, ur NEGATIVE NEGATIVE mg/dL   Protein, ur NEGATIVE NEGATIVE mg/dL   Nitrite NEGATIVE NEGATIVE   Leukocytes, UA NEGATIVE NEGATIVE   RBC / HPF 6-10 0 - 5 RBC/hpf   WBC, UA 0-5 0 - 5 WBC/hpf   Bacteria, UA NONE SEEN NONE SEEN   Squamous Epithelial / LPF 0-5 0 - 5   Mucus PRESENT   HIV antibody (Routine  Testing)  Result Value Ref Range   HIV Screen 4th Generation wRfx Non Reactive Non Reactive  Influenza panel by PCR (type A & B)  Result Value Ref Range   Influenza A By PCR NEGATIVE NEGATIVE   Influenza B By PCR NEGATIVE NEGATIVE  CBC  Result Value Ref Range   WBC 14.6 (H) 4.0 - 10.5 K/uL   RBC  4.31 3.87 - 5.11 MIL/uL   Hemoglobin 13.7 12.0 - 15.0 g/dL   HCT 16.1 09.6 - 04.5 %   MCV 96.8 80.0 - 100.0 fL   MCH 31.8 26.0 - 34.0 pg   MCHC 32.9 30.0 - 36.0 g/dL   RDW 40.9 81.1 - 91.4 %   Platelets 272 150 - 400 K/uL   nRBC 0.0 0.0 - 0.2 %  Basic metabolic panel  Result Value Ref Range   Sodium 139 135 - 145 mmol/L   Potassium 5.0 3.5 - 5.1 mmol/L   Chloride 106 98 - 111 mmol/L   CO2 26 22 - 32 mmol/L   Glucose, Bld 159 (H) 70 - 99 mg/dL   BUN 23 (H) 6 - 20 mg/dL   Creatinine, Ser 7.82 0.44 - 1.00 mg/dL   Calcium 8.8 (L) 8.9 - 10.3 mg/dL   GFR calc non Af Amer >60 >60 mL/min   GFR calc Af Amer >60 >60 mL/min   Anion gap 7 5 - 15  CBC  Result Value Ref Range   WBC 14.7 (H) 4.0 - 10.5 K/uL   RBC 4.60 3.87 - 5.11 MIL/uL   Hemoglobin 14.5 12.0 - 15.0 g/dL   HCT 95.6 21.3 - 08.6 %   MCV 98.9 80.0 - 100.0 fL   MCH 31.5 26.0 - 34.0 pg   MCHC 31.9 30.0 - 36.0 g/dL   RDW 57.8 46.9 - 62.9 %   Platelets 287 150 - 400 K/uL   nRBC 0.0 0.0 - 0.2 %  Basic metabolic panel  Result Value Ref Range   Sodium 138 135 - 145 mmol/L   Potassium 5.2 (H) 3.5 - 5.1 mmol/L   Chloride 102 98 - 111 mmol/L   CO2 27 22 - 32 mmol/L   Glucose, Bld 159 (H) 70 - 99 mg/dL   BUN 30 (H) 6 - 20 mg/dL   Creatinine, Ser 5.28 0.44 - 1.00 mg/dL   Calcium 9.6 8.9 - 41.3 mg/dL   GFR calc non Af Amer >60 >60 mL/min   GFR calc Af Amer >60 >60 mL/min   Anion gap 9 5 - 15  CG4 I-STAT (Lactic acid)  Result Value Ref Range   Lactic Acid, Venous 2.00 (HH) 0.5 - 1.9 mmol/L   Comment NOTIFIED PHYSICIAN   CG4 I-STAT (Lactic acid)  Result Value Ref Range   Lactic Acid, Venous 1.84 0.5 - 1.9 mmol/L        Assessment & Plan:   Problem List Items Addressed This Visit      Respiratory   COPD exacerbation (HCC)    Acute, improving.  She has f/u with Dr. Sung Amabile with pulmonary on 11/26/18.  She has been encouraged to attend this appointment.  Will repeat CBC and BMP today.  Continue current inhaler regimen.      Relevant Medications   ipratropium-albuterol (DUONEB) 0.5-2.5 (3) MG/3ML SOLN   Other Relevant Orders   CBC w/Diff   Basic Metabolic Panel (BMET)       Follow up plan: Return in about 2 months (around 01/25/2019) for COPD.

## 2018-11-24 NOTE — Assessment & Plan Note (Signed)
Acute, improving.  She has f/u with Dr. Sung AmabileSimonds with pulmonary on 11/26/18.  She has been encouraged to attend this appointment.  Will repeat CBC and BMP today.  Continue current inhaler regimen.

## 2018-11-25 LAB — CBC WITH DIFFERENTIAL/PLATELET
Basophils Absolute: 0 10*3/uL (ref 0.0–0.2)
Basos: 0 %
EOS (ABSOLUTE): 0.1 10*3/uL (ref 0.0–0.4)
Eos: 2 %
Hematocrit: 41 % (ref 34.0–46.6)
Hemoglobin: 13.8 g/dL (ref 11.1–15.9)
Immature Grans (Abs): 0.2 10*3/uL — ABNORMAL HIGH (ref 0.0–0.1)
Immature Granulocytes: 2 %
Lymphocytes Absolute: 1.7 10*3/uL (ref 0.7–3.1)
Lymphs: 18 %
MCH: 31.4 pg (ref 26.6–33.0)
MCHC: 33.7 g/dL (ref 31.5–35.7)
MCV: 93 fL (ref 79–97)
Monocytes Absolute: 0.5 10*3/uL (ref 0.1–0.9)
Monocytes: 6 %
Neutrophils Absolute: 7 10*3/uL (ref 1.4–7.0)
Neutrophils: 72 %
Platelets: 266 10*3/uL (ref 150–450)
RBC: 4.39 x10E6/uL (ref 3.77–5.28)
RDW: 12.7 % (ref 12.3–15.4)
WBC: 9.6 10*3/uL (ref 3.4–10.8)

## 2018-11-25 LAB — BASIC METABOLIC PANEL
BUN/Creatinine Ratio: 18 (ref 9–23)
BUN: 21 mg/dL (ref 6–24)
CO2: 23 mmol/L (ref 20–29)
Calcium: 8.7 mg/dL (ref 8.7–10.2)
Chloride: 102 mmol/L (ref 96–106)
Creatinine, Ser: 1.15 mg/dL — ABNORMAL HIGH (ref 0.57–1.00)
GFR calc Af Amer: 61 mL/min/{1.73_m2} (ref >59)
GFR calc non Af Amer: 53 mL/min/{1.73_m2} — ABNORMAL LOW (ref >59)
Glucose: 73 mg/dL (ref 65–99)
Potassium: 3.8 mmol/L (ref 3.5–5.2)
Sodium: 140 mmol/L (ref 134–144)

## 2018-11-26 ENCOUNTER — Encounter: Payer: Self-pay | Admitting: Pulmonary Disease

## 2018-11-26 ENCOUNTER — Ambulatory Visit (INDEPENDENT_AMBULATORY_CARE_PROVIDER_SITE_OTHER): Payer: Self-pay | Admitting: Pulmonary Disease

## 2018-11-26 ENCOUNTER — Other Ambulatory Visit
Admission: RE | Admit: 2018-11-26 | Discharge: 2018-11-26 | Disposition: A | Discharge status: Home / Self Care | Payer: Self-pay | Source: Ambulatory Visit | Attending: Pulmonary Disease | Admitting: Pulmonary Disease

## 2018-11-26 VITALS — BP 140/82 | HR 85 | Resp 16 | Ht 66.0 in | Wt 192.0 lb

## 2018-11-26 DIAGNOSIS — R05 Cough: Secondary | ICD-10-CM

## 2018-11-26 DIAGNOSIS — J4540 Moderate persistent asthma, uncomplicated: Secondary | ICD-10-CM | POA: Insufficient documentation

## 2018-11-26 DIAGNOSIS — R053 Chronic cough: Secondary | ICD-10-CM

## 2018-11-26 DIAGNOSIS — K219 Gastro-esophageal reflux disease without esophagitis: Secondary | ICD-10-CM

## 2018-11-26 DIAGNOSIS — J310 Chronic rhinitis: Secondary | ICD-10-CM

## 2018-11-26 DIAGNOSIS — R490 Dysphonia: Secondary | ICD-10-CM

## 2018-11-26 LAB — CBC WITH DIFFERENTIAL/PLATELET
Abs Immature Granulocytes: 0.14 10*3/uL — ABNORMAL HIGH (ref 0.00–0.07)
Basophils Absolute: 0 10*3/uL (ref 0.0–0.1)
Basophils Relative: 0 %
Eosinophils Absolute: 0.2 10*3/uL (ref 0.0–0.5)
Eosinophils Relative: 2 %
HCT: 40.8 % (ref 36.0–46.0)
Hemoglobin: 13.3 g/dL (ref 12.0–15.0)
Immature Granulocytes: 2 %
Lymphocytes Relative: 14 %
Lymphs Abs: 1.2 10*3/uL (ref 0.7–4.0)
MCH: 31.5 pg (ref 26.0–34.0)
MCHC: 32.6 g/dL (ref 30.0–36.0)
MCV: 96.7 fL (ref 80.0–100.0)
Monocytes Absolute: 0.5 10*3/uL (ref 0.1–1.0)
Monocytes Relative: 5 %
Neutro Abs: 6.7 10*3/uL (ref 1.7–7.7)
Neutrophils Relative %: 77 %
Platelets: 263 10*3/uL (ref 150–400)
RBC: 4.22 MIL/uL (ref 3.87–5.11)
RDW: 13.3 % (ref 11.5–15.5)
WBC: 8.7 10*3/uL (ref 4.0–10.5)
nRBC: 0 % (ref 0.0–0.2)

## 2018-11-26 MED ORDER — FLUTICASONE PROPIONATE 50 MCG/ACT NA SUSP: 2.0000 | Freq: Every day | NASAL | 10 refills | Status: DC

## 2018-11-26 MED ORDER — OMEPRAZOLE 40 MG PO CPDR: 40.0000 mg | DELAYED_RELEASE_CAPSULE | Freq: Every evening | ORAL | 10 refills | Status: DC

## 2018-11-26 NOTE — Patient Instructions (Signed)
Blood tests today: IgE, CBC with differential, RAST panel Lung function tests (PFTs) ordered to be performed prior to follow-up visit  Continue Symbicort inhaler, 2 sprays twice a day.  Rinse mouth thoroughly after use Continue Singulair 10 mg at bedtime Continue albuterol inhaler as needed Continue nebulizer as second line rescue therapy  New medication: Flonase nasal inhaler, 2 sprays per nostril daily New medication: Omeprazole (Prilosec) 40 mg daily

## 2018-11-29 LAB — MISC LABCORP TEST (SEND OUT): LabCorp test name: 602454

## 2018-11-29 LAB — IGE: IgE (Immunoglobulin E), Serum: 12 IU/mL (ref 6–495)

## 2018-11-30 ENCOUNTER — Telehealth: Payer: Self-pay | Admitting: Adult Health Nurse Practitioner

## 2018-11-30 NOTE — Progress Notes (Signed)
PULMONARY CONSULT NOTE  Requesting MD/Service: Post hospitalization Date of initial consultation: 11/26/18 Reason for consultation: Poorly controlled asthma  PT PROFILE: 57 y.o. female former smoker (offf and on X 20 yrs, none since 2006) hospitalized at [facility] 11/24-11/27/2019 for asthma exacerbation  DATA:  INTERVAL:  HPI:  She states that she was first diagnosed with asthma several years ago when she presented to her primary care physician with exertional dyspnea and wheezing.  At that time she was started on an unknown controller medication and a rescue inhaler with some improvement.  She presented to [facility] ED with complaints of cough and shortness of breath and was diagnosed with acute bronchitis.  She was discharged home on cough suppressant, prednisone, doxycycline and was instructed to continue her asthma medications.  She felt no improvement and presented again to the emergency department 11/24 at which time she was admitted with hypoxia due to acute bronchitis and asthma exacerbation.  Her discharge diagnoses were acute bronchitis and COPD exacerbation.  Notably, she does not have a formalized diagnosis of COPD.  At the present time she is modestly improved.  She continues to have exertional dyspnea, nonproductive cough, chest tightness.  Generally, her asthma symptoms are exacerbated by exposure to pollens and strong odors.  She does have frequent symptoms of GERD and posterior nasal drainage.  She also describes a globus sensation.  She is employed at [employer] where the odors at work can exacerbate her symptoms.  She indicates that she has not smoked since 2006.  Prior to that she smoked "off and on" for 20 years, at most 1 pack/day.  She has a cat in the home.  She has no significant environmental exposures other than the exposures at work.  There is no significant travel history.  No recent sick contacts.  Past Medical History:  Diagnosis Date  . Asthma   . Depression   . High  cholesterol   . Osteoporosis   . Thyroid disease     Past Surgical History:  Procedure Laterality Date  . TUBAL LIGATION      MEDICATIONS: I have reviewed all medications and confirmed regimen as documented  Social History   Socioeconomic History  . Marital status: Divorced    Spouse name: Not on file  . Number of children: Not on file  . Years of education: Not on file  . Highest education level: Not on file  Occupational History  . Not on file  Social Needs  . Financial resource strain: Very hard  . Food insecurity:    Worry: Often true    Inability: Often true  . Transportation needs:    Medical: No    Non-medical: No  Tobacco Use  . Smoking status: Former Smoker    Last attempt to quit: 11/15/2005    Years since quitting: 13.0  . Smokeless tobacco: Never Used  Substance and Sexual Activity  . Alcohol use: Not Currently    Comment: pt states on occasion  . Drug use: No  . Sexual activity: Yes  Lifestyle  . Physical activity:    Days per week: Patient refused    Minutes per session: Patient refused  . Stress: Rather much  Relationships  . Social connections:    Talks on phone: Patient refused    Gets together: Patient refused    Attends religious service: Patient refused    Active member of club or organization: Patient refused    Attends meetings of clubs or organizations: Patient refused  Relationship status: Patient refused  . Intimate partner violence:    Fear of current or ex partner: No    Emotionally abused: No    Physically abused: No    Forced sexual activity: No  Other Topics Concern  . Not on file  Social History Narrative   Works at [employer] has 3 children    Family History  Problem Relation Age of Onset  . Heart disease Mother   . Heart disease Father   . Diabetes Sister   . Diabetes Maternal Grandmother   . Heart disease Maternal Grandmother   . Diabetes Maternal Grandfather   . Heart disease Maternal Grandfather     ROS: No  fever, myalgias/arthralgias, unexplained weight loss or weight gain No new focal weakness or sensory deficits No otalgia, hearing loss, visual changes, nasal and sinus symptoms, mouth and throat problems No neck pain or adenopathy No abdominal pain, N/V/D, diarrhea, change in bowel pattern No dysuria, change in urinary pattern   Vitals:   11/26/18 1040 11/26/18 1042  BP:  140/82  Pulse:  85  Resp: 16   SpO2:  96%  Weight: 192 lb (87.1 kg)   Height: 5\' 6"  (1.676 m)      EXAM:  Gen: Occasional hacking cough, WDWN, No overt respiratory distress HEENT: NCAT, sclera white, oral mucosa slightly erythematous but without plaques, moderate-severe bilateral rhinitis Neck: Supple without LAN, thyromegaly, JVD, no stridor or pseudo-wheeze Lungs: breath sounds full, percussion normal, adventitious sounds: None Cardiovascular: RRR, no murmurs noted Abdomen: Soft, nontender, normal BS Ext: without clubbing, cyanosis, edema Neuro: CNs grossly intact, motor and sensory intact Skin: Limited exam, no lesions noted  DATA:   BMP Latest Ref Rng & Units 11/24/2018 11/17/2018 11/16/2018  Glucose 65 - 99 mg/dL 73 161(W) 960(A)  BUN 6 - 24 mg/dL 21 54(U) 98(J)  Creatinine 0.57 - 1.00 mg/dL 1.91(Y) 7.82 9.56  BUN/Creat Ratio 9 - 23 18 - -  Sodium 134 - 144 mmol/L 140 138 139  Potassium 3.5 - 5.2 mmol/L 3.8 5.2(H) 5.0  Chloride 96 - 106 mmol/L 102 102 106  CO2 20 - 29 mmol/L 23 27 26   Calcium 8.7 - 10.2 mg/dL 8.7 9.6 2.1(H)    CBC Latest Ref Rng & Units 11/26/2018 11/24/2018 11/17/2018  WBC 4.0 - 10.5 K/uL 8.7 9.6 14.7(H)  Hemoglobin 12.0 - 15.0 g/dL 08.6 57.8 46.9  Hematocrit 36.0 - 46.0 % 40.8 41.0 45.5  Platelets 150 - 400 K/uL 263 266 287    CXR 11/24: No acute findings  I have personally reviewed all chest radiographs reported above including CXRs and CT chest unless otherwise indicated  IMPRESSION:     ICD-10-CM   1. Moderate persistent asthma, poorly controlled J45.40 IgE     Perennial allergen profile IgE    Allergen, Cat Dander, e1    CBC with Differential/Platelet    Pulmonary Function Test [facility] Only  2. Hoarseness R49.0   3. Chronic cough R05   4. Chronic rhinitis, severe J31.0   5. Gastroesophageal reflux disease, esophagitis presence not specified K21.9    It is unclear why her symptoms (which have been well treated) have persisted to the extent that they have.  I am concerned about ongoing exposures, and particular exposure to cat dander.  The hoarseness raises the possibility of a component of vocal cord dysfunction syndrome.  PNDS and GERD might be exacerbating upper airway instability and upper airway cough syndrome.   PLAN:  Blood tests today: IgE, CBC with  differential, RAST panel Lung function tests (PFTs) ordered to be performed prior to follow-up visit  Continue Symbicort inhaler, 2 sprays twice a day.  Rinse mouth thoroughly after use Continue Singulair 10 mg at bedtime Continue albuterol inhaler as needed Continue nebulizer as second line rescue therapy  New medication: Flonase nasal inhaler, 2 sprays per nostril daily New medication: Omeprazole (Prilosec) 40 mg daily   Billy Fischer, MD PCCM service Mobile [phone] Pager [phone] 11/30/2018 8:51 AM

## 2018-11-30 NOTE — Telephone Encounter (Signed)
-----   Message from Ezekiel InaLorrie D Carter sent at 11/29/2018 12:47 PM EST ----- Regarding: FW: new referral Contact: [phone]   ----- Message ----- From: Collie SiadJohnson, Angela, RN Sent: 11/17/2018  10:57 AM EST To: Neila GearLorrie D Carter, Betty J Kluttz Subject: new referral

## 2018-11-30 NOTE — Telephone Encounter (Signed)
Called Pt and went over Elig. Requirements, Pt has application and [facility]

## 2018-12-02 ENCOUNTER — Telehealth: Payer: Self-pay | Admitting: Pharmacy Technician

## 2018-12-02 NOTE — Telephone Encounter (Signed)
Provided patient with new patient packet to obtain [facility] services.  Patient understands that [facility] must receive current financial documentation in order to determine eligibility and medication assistance.  Sherilyn DacostaBetty J. Timon Geissinger Care Manager [facility]

## 2018-12-20 ENCOUNTER — Ambulatory Visit: Payer: Self-pay | Admitting: Nurse Practitioner

## 2018-12-28 ENCOUNTER — Ambulatory Visit: Payer: Self-pay | Attending: Pulmonary Disease

## 2018-12-29 ENCOUNTER — Telehealth: Payer: Self-pay | Admitting: Pharmacy Technician

## 2018-12-29 NOTE — Telephone Encounter (Signed)
Patient failed to provide proof of income.  No additional medication assistance will be provided by [facility] without the required proof of income documentation.  Patient notified by letter.  Audrey Richard Care Manager [facility] 

## 2018-12-31 ENCOUNTER — Ambulatory Visit: Payer: Self-pay | Admitting: Pulmonary Disease

## 2019-01-04 ENCOUNTER — Emergency Department
Admission: EM | Admit: 2019-01-04 | Discharge: 2019-01-04 | Disposition: A | Discharge status: Home / Self Care | Payer: Self-pay | Attending: Emergency Medicine | Admitting: Emergency Medicine

## 2019-01-04 ENCOUNTER — Other Ambulatory Visit: Payer: Self-pay

## 2019-01-04 ENCOUNTER — Emergency Department: Payer: Self-pay

## 2019-01-04 ENCOUNTER — Ambulatory Visit: Payer: Self-pay | Admitting: Pulmonary Disease

## 2019-01-04 DIAGNOSIS — R0602 Shortness of breath: Secondary | ICD-10-CM | POA: Insufficient documentation

## 2019-01-04 DIAGNOSIS — J449 Chronic obstructive pulmonary disease, unspecified: Secondary | ICD-10-CM | POA: Insufficient documentation

## 2019-01-04 DIAGNOSIS — R509 Fever, unspecified: Secondary | ICD-10-CM | POA: Insufficient documentation

## 2019-01-04 DIAGNOSIS — R05 Cough: Secondary | ICD-10-CM | POA: Insufficient documentation

## 2019-01-04 DIAGNOSIS — Z87891 Personal history of nicotine dependence: Secondary | ICD-10-CM | POA: Insufficient documentation

## 2019-01-04 DIAGNOSIS — J101 Influenza due to other identified influenza virus with other respiratory manifestations: Secondary | ICD-10-CM | POA: Insufficient documentation

## 2019-01-04 DIAGNOSIS — M7918 Myalgia, other site: Secondary | ICD-10-CM | POA: Insufficient documentation

## 2019-01-04 DIAGNOSIS — E039 Hypothyroidism, unspecified: Secondary | ICD-10-CM | POA: Insufficient documentation

## 2019-01-04 DIAGNOSIS — Z79899 Other long term (current) drug therapy: Secondary | ICD-10-CM | POA: Insufficient documentation

## 2019-01-04 DIAGNOSIS — J189 Pneumonia, unspecified organism: Secondary | ICD-10-CM | POA: Insufficient documentation

## 2019-01-04 LAB — INFLUENZA PANEL BY PCR (TYPE A & B)
Influenza A By PCR: NEGATIVE
Influenza B By PCR: POSITIVE — AB

## 2019-01-04 MED ORDER — LEVOFLOXACIN 750 MG PO TABS: 750.0000 mg | ORAL_TABLET | Freq: Every day | ORAL | 0 refills | Status: AC

## 2019-01-04 MED ORDER — IPRATROPIUM-ALBUTEROL 0.5-2.5 (3) MG/3ML IN SOLN
3.0000 mL | Freq: Once | RESPIRATORY_TRACT | Status: AC
  Administered 2019-01-04: 3 mL via RESPIRATORY_TRACT
  Filled 2019-01-04: qty 3

## 2019-01-04 MED ORDER — LEVOFLOXACIN IN D5W 750 MG/150ML IV SOLN
750.0000 mg | Freq: Once | INTRAVENOUS | Status: AC
  Administered 2019-01-04: 750 mg via INTRAVENOUS
  Filled 2019-01-04: qty 150

## 2019-01-04 NOTE — ED Triage Notes (Signed)
Pt states bronchitis a few weeks ago. States cough, congestion, HA. States "fevers come and go" A&O, ambulatory. Mask in place. Daughter being seen for same symptoms.

## 2019-01-04 NOTE — ED Provider Notes (Signed)
[facility] Emergency Department Provider Note  ____________________________________________  Time seen: Approximately 6:22 PM  I have reviewed the triage vital signs and the nursing notes.   HISTORY  Chief Complaint Cough and Headache    HPI Audrey Richard is a 58 y.o. female with a history of COPD and asthma, presents to the emergency department with headache, body aches, nonproductive cough and shortness of breath for the past 2 days.  Patient is also had fever and chills.  She denies nausea and vomiting.  Patient was admitted in November 2018 for acute respiratory failure.  Patient has not been taking her Symbicort at home due to expense.  Patient has also not been taking duo nebs as directed by her PCP as she reports that "it sometimes makes me feel bad".  She denies chest pain.  Patient's daughter was visiting and is currently sick.  No other alleviating measures have been attempted.   Past Medical History:  Diagnosis Date  . Asthma   . Depression   . High cholesterol   . Osteoporosis   . Thyroid disease     Patient Active Problem List   Diagnosis Date Noted  . COPD exacerbation (HCC) 11/15/2018  . Acute bronchitis 11/14/2018  . Allergic conjunctivitis 07/12/2018  . Dyspareunia, female 12/08/2017  . Rhinitis 12/08/2017  . Osteopenia 06/11/2015  . Depression 06/11/2015  . Insomnia 06/11/2015  . Hip bursitis 06/11/2015  . Chronic pain 06/11/2015  . Asthma 06/11/2015  . Hypothyroidism 06/11/2015  . Hyperlipidemia 06/11/2015  . Acute anxiety 06/11/2015  . Hypercholesterolemia 06/11/2015    Past Surgical History:  Procedure Laterality Date  . TUBAL LIGATION      Prior to Admission medications   Medication Sig Start Date End Date Taking? Authorizing Provider  albuterol (PROVENTIL HFA;VENTOLIN HFA) 108 (90 Base) MCG/ACT inhaler Inhale 2 puffs into the lungs every 6 (six) hours as needed for wheezing or shortness of breath. 11/17/18   Delfino LovettShah,  Vipul, MD  azelastine (OPTIVAR) 0.05 % ophthalmic solution Place 1 drop into both eyes 2 (two) times daily. 07/12/18   Gabriel CirriWicker, [name], NP  budesonide-formoterol [facility](SYMBICORT) 160-4.5 MCG/ACT inhaler Inhale 2 puffs into the lungs 2 (two) times daily. 11/17/18   Delfino LovettShah, Vipul, MD  buPROPion (WELLBUTRIN XL) 150 MG 24 hr tablet TAKE 1 TABLET BY MOUTH ONCE DAILY 03/03/18   Gabriel CirriWicker, [name], NP  diphenhydrAMINE (BENADRYL) 25 mg capsule Take 25 mg by mouth every 6 (six) hours as needed.    [provider]  FLUoxetine (PROZAC) 20 MG capsule TAKE 1 CAPSULE BY MOUTH ONCE DAILY 09/28/18   Gabriel CirriWicker, [name], NP  fluticasone [facility](FLONASE) 50 MCG/ACT nasal spray Place 2 sprays into both nostrils daily. 11/26/18   Merwyn KatosSimonds, David B, MD  ipratropium-albuterol (DUONEB) 0.5-2.5 (3) MG/3ML SOLN Take 3 mLs by nebulization every 6 (six) hours as needed. 11/24/18   Cannady, Corrie DandyJolene T, NP  levofloxacin (LEVAQUIN) 750 MG tablet Take 1 tablet (750 mg total) by mouth daily for 4 days. 01/04/19 01/08/19  Orvil FeilWoods, Adley Castello M, PA-C  levothyroxine (SYNTHROID, LEVOTHROID) 125 MCG tablet Take 1 tablet (125 mcg total) by mouth daily. 10/27/18   Aura Dialsannady, Jolene T, NP  lovastatin (MEVACOR) 20 MG tablet TAKE 1 TABLET BY MOUTH AT BEDTIME 09/14/18   Osvaldo AngstPollak, Adriana M, PA-C  montelukast (SINGULAIR) 10 MG tablet TAKE 1 TABLET BY MOUTH AT BEDTIME 09/14/18   Osvaldo AngstPollak, Adriana M, PA-C  omeprazole (PRILOSEC) 40 MG capsule Take 1 capsule (40 mg total) by mouth every evening. 11/26/18  Merwyn Katos, MD  traZODone (DESYREL) 50 MG tablet TAKE 1 TABLET BY MOUTH AT BEDTIME AS NEEDED 02/15/18   Gabriel Cirri, NP    Allergies Biaxin [clarithromycin] and Iodine  Family History  Problem Relation Age of Onset  . Heart disease Mother   . Heart disease Father   . Diabetes Sister   . Diabetes Maternal Grandmother   . Heart disease Maternal Grandmother   . Diabetes Maternal Grandfather   . Heart disease Maternal Grandfather     Social History Social History    Tobacco Use  . Smoking status: Former Smoker    Last attempt to quit: 11/15/2005    Years since quitting: 13.1  . Smokeless tobacco: Never Used  Substance Use Topics  . Alcohol use: Not Currently    Comment: pt states on occasion  . Drug use: No      Review of Systems  Constitutional: Patient has fever.  Eyes: No visual changes. No discharge ENT: Patient has congestion.  Cardiovascular: no chest pain. Respiratory: Patient has cough.  Gastrointestinal: No abdominal pain.  No nausea, no vomiting. No diarrhea.  Genitourinary: Negative for dysuria. No hematuria Musculoskeletal: Patient has myalgias.  Skin: Negative for rash, abrasions, lacerations, ecchymosis. Neurological: Patient has headache, no focal weakness or numbness.     ____________________________________________   PHYSICAL EXAM:  VITAL SIGNS: ED Triage Vitals  Enc Vitals Group     BP 01/04/19 1704 104/75     Pulse Rate 01/04/19 1704 89     Resp 01/04/19 1704 18     Temp 01/04/19 1704 98.3 F (36.8 C)     Temp Source 01/04/19 1704 Oral     SpO2 01/04/19 1704 94 %     Weight 01/04/19 1705 197 lb (89.4 kg)     Height 01/04/19 1705 5\' 7"  (1.702 m)     Head Circumference --      Peak Flow --      Pain Score 01/04/19 1705 10     Pain Loc --      Pain Edu? --      Excl. in GC? --     Constitutional: Alert and oriented. Patient is lying supine. Eyes: Conjunctivae are normal. PERRL. EOMI. Head: Atraumatic. ENT:      Ears: Tympanic membranes are mildly injected with mild effusion bilaterally.       Nose: No congestion/rhinnorhea.      Mouth/Throat: Mucous membranes are moist. Posterior pharynx is mildly erythematous.  Hematological/Lymphatic/Immunilogical: No cervical lymphadenopathy.  Cardiovascular: Normal rate, regular rhythm. Normal S1 and S2.  Good peripheral circulation. Respiratory: Normal respiratory effort without tachypnea or retractions. Lungs CTAB. Good air entry to the bases with no  decreased or absent breath sounds. Gastrointestinal: Bowel sounds 4 quadrants. Soft and nontender to palpation. No guarding or rigidity. No palpable masses. No distention. No CVA tenderness. Musculoskeletal: Full range of motion to all extremities. No gross deformities appreciated. Neurologic:  Normal speech and language. No gross focal neurologic deficits are appreciated.  Skin:  Skin is warm, dry and intact. No rash noted. Psychiatric: Mood and affect are normal. Speech and behavior are normal. Patient exhibits appropriate insight and judgement.  ____________________________________________   LABS (all labs ordered are listed, but only abnormal results are displayed)  Labs Reviewed  INFLUENZA PANEL BY PCR (TYPE A & B) - Abnormal; Notable for the following components:      Result Value   Influenza B By PCR POSITIVE (*)    All other components within  normal limits   ____________________________________________  EKG   ____________________________________________  RADIOLOGY I personally viewed and evaluated these images as part of my medical decision making, as well as reviewing the written report by the radiologist.  Dg Chest 2 View  Result Date: 01/04/2019 CLINICAL DATA:  58 year old female with recent bronchitis. Continued cough, congestion, headache. EXAM: CHEST - 2 VIEW COMPARISON:  11/14/2018 and earlier. FINDINGS: Stable large lung volumes. Mediastinal contours remain normal. Visualized tracheal air column is within normal limits. No pneumothorax, pleural effusion, pulmonary edema or confluent pulmonary opacity. Mildly increased interstitial markings appear stable. Chronic right lateral rib fractures. No acute osseous abnormality identified. Negative visible bowel gas pattern. IMPRESSION: 1. No acute cardiopulmonary abnormality. 2. There is a degree of chronic pulmonary hyperinflation and interstitial opacity. Electronically Signed   By: Odessa Fleming M.D.   On: 01/04/2019 17:38     ____________________________________________    PROCEDURES  Procedure(s) performed:    Procedures    Medications  ipratropium-albuterol (DUONEB) 0.5-2.5 (3) MG/3ML nebulizer solution 3 mL (3 mLs Nebulization Given 01/04/19 1817)  levofloxacin (LEVAQUIN) IVPB 750 mg (0 mg Intravenous Stopped 01/04/19 1918)     ____________________________________________   INITIAL IMPRESSION / ASSESSMENT AND PLAN / ED COURSE  Pertinent labs & imaging results that were available during my care of the patient were reviewed by me and considered in my medical decision making (see chart for details).  Review of the [place] CSRS was performed in accordance of the [facility] prior to dispensing any controlled drugs.    Assessment and Plan:  Influenza B:  Community Acquired Pneumonia: Patient presents to the emergency department with 2 days of headache, body aches, nonproductive cough and shortness of breath.  Patient tested positive for influenza B in the emergency department.  Patient declined empiric treatment with Tamiflu.  Chest x-ray was also obtained given patient's recent history of acute respiratory failure which revealed an opacity concerning for community-acquired pneumonia.  Patient was started empirically on Levaquin as patient has high risk for poor outcomes and has recently completed a course of Augmentin.  Patient was given DuoNeb in emergency department.  She has duo nebs at home and was advised to take breathing treatments every 6 hours over the next 48 hours.  She was advised to return to the emergency department for new or worsening symptoms.  Patient was advised to follow-up with PCP in 1 week to assess for symptomatic improvement.  Vital signs were reassuring prior to discharge.   ____________________________________________  FINAL CLINICAL IMPRESSION(S) / ED DIAGNOSES  Final diagnoses:  Influenza B  Community acquired pneumonia, unspecified laterality      NEW MEDICATIONS STARTED  DURING THIS VISIT:  ED Discharge Orders         Ordered    levofloxacin (LEVAQUIN) 750 MG tablet  Daily     01/04/19 1908              This chart was dictated using voice recognition software/Dragon. Despite best efforts to proofread, errors can occur which can change the meaning. Any change was purely unintentional.    Gasper Lloyd 01/04/19 2012    Myrna Blazer, MD 01/04/19 2252

## 2019-01-04 NOTE — ED Notes (Signed)
Leonides Schanz, PA-C aware that Levaquin has finished.

## 2019-01-04 NOTE — ED Notes (Signed)
See triage note   Presents with cough and congestion  States she was recently dx'd with bronchitis a couple of weeks ago  Now having intermittent subjective fever with cough and body aches

## 2019-01-10 ENCOUNTER — Other Ambulatory Visit: Payer: Self-pay | Admitting: Physician Assistant

## 2019-01-10 ENCOUNTER — Other Ambulatory Visit: Payer: Self-pay | Admitting: Unknown Physician Specialty

## 2019-01-10 NOTE — Telephone Encounter (Signed)
Requested Prescriptions  Pending Prescriptions Disp Refills  . FLUoxetine (PROZAC) 20 MG capsule [Pharmacy Med Name: FLUoxetine HCl 20 MG Oral Capsule] 90 capsule 0    Sig: TAKE 1 CAPSULE BY MOUTH ONCE DAILY     Psychiatry:  Antidepressants - SSRI Passed - 01/10/2019  4:18 PM      Passed - Completed PHQ-2 or PHQ-9 in the last 360 days.      Passed - Valid encounter within last 6 months    Recent Outpatient Visits          1 month ago COPD exacerbation (HCC)   [facility] [place], [place] T, NP   6 months ago Encounter for hepatitis C screening test for low risk patient   [facility] Gabriel Cirri, NP   1 year ago Dyspareunia, female   [facility] Gabriel Cirri, NP   1 year ago Need for hepatitis C screening test   [facility] Gabriel Cirri, NP   1 year ago Cough   [facility] Gabriel Cirri, NP      Future Appointments            In 1 month Simonds, Oley Balm, MD [facility]   In 1 month [place], Dorie Rank, NP [facility]

## 2019-02-01 ENCOUNTER — Other Ambulatory Visit: Payer: Self-pay | Admitting: Unknown Physician Specialty

## 2019-02-01 ENCOUNTER — Other Ambulatory Visit: Payer: Self-pay | Admitting: Nurse Practitioner

## 2019-02-11 ENCOUNTER — Telehealth: Payer: Self-pay

## 2019-02-11 NOTE — Telephone Encounter (Signed)
Spoke to patient, she was a NS for her PFT, let her know Dr. Sung Amabile wants that test done before she returns. She will call our office to r/s PFT and f/u apt. She is in the middle of moving.

## 2019-02-16 ENCOUNTER — Other Ambulatory Visit: Payer: Self-pay | Admitting: Physician Assistant

## 2019-02-16 ENCOUNTER — Ambulatory Visit: Payer: Self-pay | Admitting: Pulmonary Disease

## 2019-02-17 ENCOUNTER — Ambulatory Visit: Payer: Self-pay | Admitting: Pulmonary Disease

## 2019-02-20 ENCOUNTER — Encounter: Payer: Self-pay | Admitting: Nurse Practitioner

## 2019-02-21 ENCOUNTER — Ambulatory Visit: Payer: Self-pay | Admitting: Nurse Practitioner

## 2019-03-29 ENCOUNTER — Other Ambulatory Visit: Payer: Self-pay | Admitting: Unknown Physician Specialty

## 2019-03-29 NOTE — Telephone Encounter (Signed)
Requested Prescriptions  Pending Prescriptions Disp Refills  . buPROPion (WELLBUTRIN XL) 150 MG 24 hr tablet [Pharmacy Med Name: buPROPion HCl ER (XL) 150 MG Oral Tablet Extended Release 24 Hour] 90 tablet 0    Sig: Take 1 tablet by mouth once daily     Psychiatry: Antidepressants - bupropion Passed - 03/29/2019  4:11 PM      Passed - Completed PHQ-2 or PHQ-9 in the last 360 days.      Passed - Last BP in normal range    BP Readings from Last 1 Encounters:  01/04/19 125/85         Passed - Valid encounter within last 6 months    Recent Outpatient Visits          4 months ago COPD exacerbation (HCC)   [facility] [place], [place] T, NP   8 months ago Encounter for hepatitis C screening test for low risk patient   [facility] Gabriel Cirri, NP   1 year ago Dyspareunia, female   [facility] Gabriel Cirri, NP   1 year ago Need for hepatitis C screening test   [facility] Gabriel Cirri, NP   2 years ago Cough   [facility] Gabriel Cirri, NP

## 2019-04-26 ENCOUNTER — Other Ambulatory Visit: Payer: Self-pay | Admitting: Nurse Practitioner

## 2019-05-27 ENCOUNTER — Other Ambulatory Visit: Payer: Self-pay | Admitting: Nurse Practitioner

## 2019-05-27 NOTE — Telephone Encounter (Signed)
Requested medication (s) are due for refill today: yes  Requested medication (s) are on the active medication list: yes  Last refill:  05/10/19  Future visit scheduled: No  Notes to clinic:  Unable to refill per protocol. No labs for cholesterol noted since 2018.    Requested Prescriptions  Pending Prescriptions Disp Refills   lovastatin (MEVACOR) 20 MG tablet [Pharmacy Med Name: Lovastatin 20 MG Oral Tablet] 90 tablet 0    Sig: TAKE 1 TABLET BY MOUTH AT BEDTIME     Cardiovascular:  Antilipid - Statins Failed - 05/27/2019  6:19 PM      Failed - Total Cholesterol in normal range and within 360 days    Cholesterol, Total  Date Value Ref Range Status  02/11/2017 167 100 - 199 mg/dL Final         Failed - LDL in normal range and within 360 days    LDL Calculated  Date Value Ref Range Status  02/11/2017 97 0 - 99 Final         Failed - HDL in normal range and within 360 days    HDL  Date Value Ref Range Status  02/11/2017 56 >39 mg/dL Final         Failed - Triglycerides in normal range and within 360 days    Triglycerides  Date Value Ref Range Status  02/11/2017 70 0 - 149 mg/dL Final         Passed - Patient is not pregnant      Passed - Valid encounter within last 12 months    Recent Outpatient Visits          6 months ago COPD exacerbation (HCC)   [facility] Cannady, [place] T, NP   10 months ago Encounter for hepatitis C screening test for low risk patient   [facility] Gabriel Cirri, NP   1 year ago Dyspareunia, female   [facility] Gabriel Cirri, NP   1 year ago Need for hepatitis C screening test   [facility] Gabriel Cirri, NP   2 years ago Cough   [facility] Gabriel Cirri, NP           Signed Prescriptions Disp Refills   levothyroxine (SYNTHROID) 125 MCG tablet 90 tablet 0    Sig: Take 1 tablet by mouth once daily     Endocrinology:  Hypothyroid Agents Failed - 05/27/2019  6:19 PM       Failed - TSH needs to be rechecked within 3 months after an abnormal result. Refill until TSH is due.      Passed - TSH in normal range and within 360 days    TSH  Date Value Ref Range Status  07/12/2018 2.300 0.450 - 4.500 uIU/mL Final         Passed - Valid encounter within last 12 months    Recent Outpatient Visits          6 months ago COPD exacerbation (HCC)   [facility] G[place], [place] T, NP   10 months ago Encounter for hepatitis C screening test for low risk patient   [facility] Gabriel Cirri, NP   1 year ago Dyspareunia, female   [facility] Gabriel Cirri, NP   1 year ago Need for hepatitis C screening test   [facility] Gabriel Cirri, NP   2 years ago Cough   [facility] Gabriel Cirri, NP

## 2019-07-05 ENCOUNTER — Telehealth: Payer: Self-pay | Admitting: Nurse Practitioner

## 2019-07-05 ENCOUNTER — Other Ambulatory Visit: Payer: Self-pay | Admitting: Nurse Practitioner

## 2019-07-05 MED ORDER — OMEPRAZOLE 40 MG PO CPDR: 40.0000 mg | DELAYED_RELEASE_CAPSULE | Freq: Every evening | ORAL | 10 refills | Status: DC

## 2019-07-05 MED ORDER — FLUTICASONE PROPIONATE 50 MCG/ACT NA SUSP: 2.0000 | Freq: Every day | NASAL | 10 refills | Status: DC

## 2019-07-05 MED ORDER — FLUOXETINE HCL 20 MG PO CAPS: 20.0000 mg | ORAL_CAPSULE | Freq: Every day | ORAL | 2 refills | Status: DC

## 2019-07-05 MED ORDER — ALBUTEROL SULFATE HFA 108 (90 BASE) MCG/ACT IN AERS: 2.0000 | INHALATION_SPRAY | Freq: Four times a day (QID) | RESPIRATORY_TRACT | 2 refills | Status: DC | PRN

## 2019-07-05 MED ORDER — BUPROPION HCL ER (XL) 150 MG PO TB24: 150.0000 mg | ORAL_TABLET | Freq: Every day | ORAL | 2 refills | Status: DC

## 2019-07-05 MED ORDER — IPRATROPIUM-ALBUTEROL 0.5-2.5 (3) MG/3ML IN SOLN: 3.0000 mL | Freq: Four times a day (QID) | RESPIRATORY_TRACT | 0 refills | Status: DC | PRN

## 2019-07-05 MED ORDER — AZELASTINE HCL 0.05 % OP SOLN: 1.0000 [drp] | Freq: Two times a day (BID) | OPHTHALMIC | 2 refills | Status: DC

## 2019-07-05 MED ORDER — BUDESONIDE-FORMOTEROL FUMARATE 160-4.5 MCG/ACT IN AERO: 2.0000 | INHALATION_SPRAY | Freq: Two times a day (BID) | RESPIRATORY_TRACT | 3 refills | Status: DC

## 2019-07-05 MED ORDER — LEVOTHYROXINE SODIUM 125 MCG PO TABS: 125.0000 ug | ORAL_TABLET | Freq: Every day | ORAL | 2 refills | Status: DC

## 2019-07-05 MED ORDER — TRAZODONE HCL 50 MG PO TABS: 50.0000 mg | ORAL_TABLET | Freq: Every evening | ORAL | 2 refills | Status: DC | PRN

## 2019-07-05 MED ORDER — LOVASTATIN 20 MG PO TABS: 20.0000 mg | ORAL_TABLET | Freq: Every day | ORAL | 0 refills | Status: DC

## 2019-07-05 MED ORDER — MONTELUKAST SODIUM 10 MG PO TABS: 10.0000 mg | ORAL_TABLET | Freq: Every day | ORAL | 5 refills | Status: DC

## 2019-07-05 NOTE — Telephone Encounter (Signed)
Pt called in to get refills on all prescriptions states she's about to run out. Pt scheduled medication refill appoint 8/17 due to saying that she has to be paid before she can schedule appointment. Please advise if medications can be refilled at this time or will she have to wait until her appointment.

## 2019-07-05 NOTE — Telephone Encounter (Signed)
Refills sent

## 2019-07-05 NOTE — Progress Notes (Signed)
Medication refills

## 2019-07-06 NOTE — Telephone Encounter (Signed)
Called pt to let her know that Audrey Richard sent in refills, no answer, left vm

## 2019-08-08 ENCOUNTER — Other Ambulatory Visit: Payer: Self-pay

## 2019-08-08 ENCOUNTER — Ambulatory Visit (INDEPENDENT_AMBULATORY_CARE_PROVIDER_SITE_OTHER): Payer: Self-pay | Admitting: Nurse Practitioner

## 2019-08-08 ENCOUNTER — Encounter: Payer: Self-pay | Admitting: Nurse Practitioner

## 2019-08-08 VITALS — BP 117/77 | HR 73 | Temp 98.5°F

## 2019-08-08 DIAGNOSIS — J45909 Unspecified asthma, uncomplicated: Secondary | ICD-10-CM

## 2019-08-08 DIAGNOSIS — G8929 Other chronic pain: Secondary | ICD-10-CM

## 2019-08-08 DIAGNOSIS — E039 Hypothyroidism, unspecified: Secondary | ICD-10-CM

## 2019-08-08 DIAGNOSIS — E785 Hyperlipidemia, unspecified: Secondary | ICD-10-CM

## 2019-08-08 MED ORDER — MELOXICAM 15 MG PO TABS: 15.0000 mg | ORAL_TABLET | ORAL | 0 refills | Status: DC | PRN

## 2019-08-08 MED ORDER — LOVASTATIN 20 MG PO TABS: 20.0000 mg | ORAL_TABLET | Freq: Every day | ORAL | 2 refills | Status: DC

## 2019-08-08 NOTE — Assessment & Plan Note (Signed)
To back, hips, knees, and hands.  Will trial Meloxicam for brief period.  Monitor BP with this and CRT.  CCM referral placed for chronic disease, if no improvement in pain over next few months may have them assist with PT program for patient.  Continue Tylenol as needed and OTC creams (recommend Voltaren) + heat/ice as needed.  If ongoing consider imaging.  Labs today.

## 2019-08-08 NOTE — Assessment & Plan Note (Signed)
Chronic, ongoing.  Continue current medication regimen and adjust as needed based on labs.  Thyroid panel today.   

## 2019-08-08 NOTE — Assessment & Plan Note (Signed)
Chronic, ongoing.  Continue current medication regimen and adjust as needed. Lipid panel today. 

## 2019-08-08 NOTE — Patient Instructions (Signed)
Voltaren gel   Osteoarthritis  Osteoarthritis is a type of arthritis that affects tissue that covers the ends of bones in joints (cartilage). Cartilage acts as a cushion between the bones and helps them move smoothly. Osteoarthritis results when cartilage in the joints gets worn down. Osteoarthritis is sometimes called "wear and tear" arthritis. Osteoarthritis is the most common form of arthritis. It often occurs in older people. It is a condition that gets worse over time (a progressive condition). Joints that are most often affected by this condition are in:  Fingers.  Toes.  Hips.  Knees.  Spine, including neck and lower back. What are the causes? This condition is caused by age-related wearing down of cartilage that covers the ends of bones. What increases the risk? The following factors may make you more likely to develop this condition:  Older age.  Being overweight or obese.  Overuse of joints, such as in athletes.  Past injury of a joint.  Past surgery on a joint.  Family history of osteoarthritis. What are the signs or symptoms? The main symptoms of this condition are pain, swelling, and stiffness in the joint. The joint may lose its shape over time. Small pieces of bone or cartilage may break off and float inside of the joint, which may cause more pain and damage to the joint. Small deposits of bone (osteophytes) may grow on the edges of the joint. Other symptoms may include:  A grating or scraping feeling inside the joint when you move it.  Popping or creaking sounds when you move. Symptoms may affect one or more joints. Osteoarthritis in a major joint, such as your knee or hip, can make it painful to walk or exercise. If you have osteoarthritis in your hands, you might not be able to grip items, twist your hand, or control small movements of your hands and fingers (fine motor skills). How is this diagnosed? This condition may be diagnosed based on:  Your medical  history.  A physical exam.  Your symptoms.  X-rays of the affected joint(s).  Blood tests to rule out other types of arthritis. How is this treated? There is no cure for this condition, but treatment can help to control pain and improve joint function. Treatment plans may include:  A prescribed exercise program that allows for rest and joint relief. You may work with a physical therapist.  A weight control plan.  Pain relief techniques, such as: ? Applying heat and cold to the joint. ? Electric pulses delivered to nerve endings under the skin (transcutaneous electrical nerve stimulation, or TENS). ? Massage. ? Certain nutritional supplements.  NSAIDs or prescription medicines to help relieve pain.  Medicine to help relieve pain and inflammation (corticosteroids). This can be given by mouth (orally) or as an injection.  Assistive devices, such as a brace, wrap, splint, specialized glove, or cane.  Surgery, such as: ? An osteotomy. This is done to reposition the bones and relieve pain or to remove loose pieces of bone and cartilage. ? Joint replacement surgery. You may need this surgery if you have very bad (advanced) osteoarthritis. Follow these instructions at home: Activity  Rest your affected joints as directed by your health care provider.  Do not drive or use heavy machinery while taking prescription pain medicine.  Exercise as directed. Your health care provider or physical therapist may recommend specific types of exercise, such as: ? Strengthening exercises. These are done to strengthen the muscles that support joints that are affected by arthritis.  They can be performed with weights or with exercise bands to add resistance. ? Aerobic activities. These are exercises, such as brisk walking or water aerobics, that get your heart pumping. ? Range-of-motion activities. These keep your joints easy to move. ? Balance and agility exercises. Managing pain, stiffness, and  swelling      If directed, apply heat to the affected area as often as told by your health care provider. Use the heat source that your health care provider recommends, such as a moist heat pack or a heating pad. ? If you have a removable assistive device, remove it as told by your health care provider. ? Place a towel between your skin and the heat source. If your health care provider tells you to keep the assistive device on while you apply heat, place a towel between the assistive device and the heat source. ? Leave the heat on for 20-30 minutes. ? Remove the heat if your skin turns bright red. This is especially important if you are unable to feel pain, heat, or cold. You may have a greater risk of getting burned.  If directed, put ice on the affected joint: ? If you have a removable assistive device, remove it as told by your health care provider. ? Put ice in a plastic bag. ? Place a towel between your skin and the bag. If your health care provider tells you to keep the assistive device on during icing, place a towel between the assistive device and the bag. ? Leave the ice on for 20 minutes, 2-3 times a day. General instructions  Take over-the-counter and prescription medicines only as told by your health care provider.  Maintain a healthy weight. Follow instructions from your health care provider for weight control. These may include dietary restrictions.  Do not use any products that contain nicotine or tobacco, such as cigarettes and e-cigarettes. These can delay bone healing. If you need help quitting, ask your health care provider.  Use assistive devices as directed by your health care provider.  Keep all follow-up visits as told by your health care provider. This is important. Where to find more information  [employer] and Musculoskeletal and Skin Diseases: www.niams.http://www.myers.net/nih.gov  [employer] A[employer]: https://walker.com/www.nia.nih.gov  American College of  Rheumatology: www.rheumatology.org Contact a health care provider if:  Your skin turns red.  You develop a rash.  You have pain that gets worse.  You have a fever along with joint or muscle aches. Get help right away if:  You lose a lot of weight.  You suddenly lose your appetite.  You have night sweats. Summary  Osteoarthritis is a type of arthritis that affects tissue covering the ends of bones in joints (cartilage).  This condition is caused by age-related wearing down of cartilage that covers the ends of bones.  The main symptom of this condition is pain, swelling, and stiffness in the joint.  There is no cure for this condition, but treatment can help to control pain and improve joint function. This information is not intended to replace advice given to you by your health care provider. Make sure you discuss any questions you have with your health care provider. Document Released: 12/08/2005 Document Revised: 11/20/2017 Document Reviewed: 08/11/2016 Elsevier Patient Education  2020 [employer]

## 2019-08-08 NOTE — Progress Notes (Signed)
BP 117/77    Pulse 73    Temp 98.5 F (36.9 C) (Oral)    LMP  (LMP Unknown)    SpO2 97%    Subjective:    Patient ID: Audrey Richard, female    DOB: [birth year], 58 y.o.   MRN: [id number]  HPI: Audrey Richard is a 58 y.o. female  Chief Complaint  Patient presents with   Follow-up   Joint Pain    pt states she has had increased joint pain all over for the past month, especially in hands   HYPOTHYROIDISM Continues on Levothyroxine 125 MCG daily. Thyroid control status:stable Satisfied with current treatment? yes Medication side effects: no Medication compliance: good compliance Etiology of hypothyroidism:  Recent dose adjustment:no Fatigue: no Cold intolerance: no Heat intolerance: no Weight gain: no Weight loss: no Constipation: no Diarrhea/loose stools: no Palpitations: no Lower extremity edema: no Anxiety/depressed mood: no   HYPERLIPIDEMIA Continues Lovastatin 20 MG daily. Hyperlipidemia status: good compliance Satisfied with current treatment?  yes Side effects:  no Medication compliance: good compliance Past cholesterol meds: Lovastatin Supplements: none Aspirin:  no The 10-year ASCVD risk score Mikey Bussing DC Jr., et al., 2013) is: 1.9%   Values used to calculate the score:     Age: 54 years     Sex: Female     Is Non-Hispanic African American: No     Diabetic: No     Tobacco smoker: No     Systolic Blood Pressure: 458 mmHg     Is BP treated: No     HDL Cholesterol: 56 mg/dL     Total Cholesterol: 167 mg/dL Chest pain:  no Coronary artery disease:  no Family history CAD:  no Family history early CAD:  no   JOINT PAIN: Has history of chronic pain syndrome.  In past issues with hip pain bilaterally and middle to lower back, reports these pains continue to bother her "every single day".  Currently reports her hands and knees are bothering her more frequently, started to bother her in past 2-3 months.  States she has "knots" on her fingers sometimes and  swelling sometimes.  Reports pain varies during daytime hours.  In the morning it hurts the worst until she begins moving a lot, for at least 30 minutes.  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  No history of tick bite. Duration: underlying chronic pain Involved hand: bilateral Mechanism of injury: unknown Location: in fingers diffuse Onset: gradual Severity: 10/10  Quality: dull, aching and throbbing Frequency: intermittent Radiation: no Aggravating factors: nothing Alleviating factors: Aleeve Treatments attempted: Aleeve, Tylenol, OTC creams Relief with NSAIDs?: mild Weakness: no Numbness: no Redness: no Swelling:yes Bruising: no Fevers: no  Relevant past medical, surgical, family and social history reviewed and updated as indicated. Interim medical history since our last visit reviewed. Allergies and medications reviewed and updated.  Review of Systems  Constitutional: Negative for activity change, appetite change, diaphoresis, fatigue and fever.  Respiratory: Negative for cough, chest tightness and shortness of breath.   Cardiovascular: Negative for chest pain, palpitations and leg swelling.  Gastrointestinal: Negative for abdominal distention, abdominal pain, constipation, diarrhea, nausea and vomiting.  Endocrine: Negative for cold intolerance and heat intolerance.  Musculoskeletal: Positive for arthralgias.  Neurological: Negative for dizziness, syncope, weakness, light-headedness, numbness and headaches.  Psychiatric/Behavioral: Negative.     Per HPI unless specifically indicated above     Objective:    BP 117/77    Pulse 73  Temp 98.5 F (36.9 C) (Oral)    LMP  (LMP Unknown)    SpO2 97%   Wt Readings from Last 3 Encounters:  01/04/19 197 lb (89.4 kg)  11/26/18 192 lb (87.1 kg)  11/24/18 191 lb (86.6 kg)    Physical Exam Vitals signs and nursing note reviewed.  Constitutional:      General: She is awake. She is not in acute distress.     Appearance: She is well-developed and overweight. She is not ill-appearing.  HENT:     Head: Normocephalic.     Right Ear: Hearing normal.     Left Ear: Hearing normal.  Eyes:     General: Lids are normal.        Right eye: No discharge.        Left eye: No discharge.     Conjunctiva/sclera: Conjunctivae normal.     Pupils: Pupils are equal, round, and reactive to light.  Neck:     Musculoskeletal: Normal range of motion and neck supple.     Thyroid: No thyromegaly.     Vascular: No carotid bruit.  Cardiovascular:     Rate and Rhythm: Normal rate and regular rhythm.     Heart sounds: Normal heart sounds. No murmur. No gallop.   Pulmonary:     Effort: Pulmonary effort is normal. No accessory muscle usage or respiratory distress.     Breath sounds: Normal breath sounds.  Abdominal:     General: Bowel sounds are normal.     Palpations: Abdomen is soft.  Musculoskeletal:     Right knee: She exhibits normal range of motion, no swelling, no ecchymosis and no erythema. No tenderness found.     Left knee: She exhibits normal range of motion, no swelling and no erythema. No tenderness found.     Right hand: She exhibits normal range of motion, no tenderness, no deformity and no swelling. Normal sensation noted. Normal strength noted.     Left hand: She exhibits normal range of motion, no tenderness, no deformity and no swelling. Normal sensation noted. Normal strength noted.     Right lower leg: No edema.     Left lower leg: No edema.  Lymphadenopathy:     Cervical: No cervical adenopathy.  Skin:    General: Skin is warm and dry.  Neurological:     Mental Status: She is alert and oriented to person, place, and time.  Psychiatric:        Attention and Perception: Attention normal.        Mood and Affect: Mood normal.        Speech: Speech normal.        Behavior: Behavior normal. Behavior is cooperative.        Thought Content: Thought content normal.        Judgment: Judgment  normal.     Results for orders placed or performed during the hospital encounter of 01/04/19  Influenza panel by PCR (type A & B)  Result Value Ref Range   Influenza A By PCR NEGATIVE NEGATIVE   Influenza B By PCR POSITIVE (A) NEGATIVE      Assessment & Plan:   Problem List Items Addressed This Visit      Respiratory   Asthma   Relevant Orders   Ambulatory referral to Chronic Care Management Services     Endocrine   Hypothyroidism - Primary    Chronic, ongoing.  Continue current medication regimen and adjust as needed based on labs.  Thyroid panel today.         Relevant Orders   Thyroid Panel With TSH     Other   Chronic pain    To back, hips, knees, and hands.  Will trial Meloxicam for brief period.  Monitor BP with this and CRT.  CCM referral placed for chronic disease, if no improvement in pain over next few months may have them assist with PT program for patient.  Continue Tylenol as needed and OTC creams (recommend Voltaren) + heat/ice as needed.  If ongoing consider imaging.  Labs today.      Relevant Medications   meloxicam (MOBIC) 15 MG tablet   Other Relevant Orders   ANA w/Reflex if Positive   Rheumatoid factor   CBC with Differential/Platelet   Uric acid   Sed Rate (ESR)   Hyperlipidemia    Chronic, ongoing.  Continue current medication regimen and adjust as needed.  Lipid panel today.      Relevant Medications   lovastatin (MEVACOR) 20 MG tablet   Other Relevant Orders   Comprehensive metabolic panel   Lipid Panel w/o Chol/HDL Ratio       Follow up plan: Return in about 3 months (around 11/08/2019) for Asthma follow-up.

## 2019-08-10 LAB — THYROID PANEL WITH TSH
Free Thyroxine Index: 2.3 (ref 1.2–4.9)
T3 Uptake Ratio: 25 % (ref 24–39)
T4, Total: 9.1 ug/dL (ref 4.5–12.0)
TSH: 1.93 u[IU]/mL (ref 0.450–4.500)

## 2019-08-10 LAB — CBC WITH DIFFERENTIAL/PLATELET
Basophils Absolute: 0 10*3/uL (ref 0.0–0.2)
Basos: 1 %
EOS (ABSOLUTE): 0.1 10*3/uL (ref 0.0–0.4)
Eos: 2 %
Hematocrit: 42.9 % (ref 34.0–46.6)
Hemoglobin: 14.1 g/dL (ref 11.1–15.9)
Immature Grans (Abs): 0.1 10*3/uL (ref 0.0–0.1)
Immature Granulocytes: 1 %
Lymphocytes Absolute: 0.8 10*3/uL (ref 0.7–3.1)
Lymphs: 17 %
MCH: 32 pg (ref 26.6–33.0)
MCHC: 32.9 g/dL (ref 31.5–35.7)
MCV: 98 fL — ABNORMAL HIGH (ref 79–97)
Monocytes Absolute: 0.3 10*3/uL (ref 0.1–0.9)
Monocytes: 7 %
Neutrophils Absolute: 3.6 10*3/uL (ref 1.4–7.0)
Neutrophils: 72 %
Platelets: 264 10*3/uL (ref 150–450)
RBC: 4.4 x10E6/uL (ref 3.77–5.28)
RDW: 12.3 % (ref 11.7–15.4)
WBC: 4.9 10*3/uL (ref 3.4–10.8)

## 2019-08-10 LAB — COMPREHENSIVE METABOLIC PANEL
ALT: 13 IU/L (ref 0–32)
AST: 16 IU/L (ref 0–40)
Albumin/Globulin Ratio: 2.5 — ABNORMAL HIGH (ref 1.2–2.2)
Albumin: 4.8 g/dL (ref 3.8–4.9)
Alkaline Phosphatase: 42 IU/L (ref 39–117)
BUN/Creatinine Ratio: 22 (ref 9–23)
BUN: 18 mg/dL (ref 6–24)
Bilirubin Total: 0.3 mg/dL (ref 0.0–1.2)
CO2: 25 mmol/L (ref 20–29)
Calcium: 9.4 mg/dL (ref 8.7–10.2)
Chloride: 103 mmol/L (ref 96–106)
Creatinine, Ser: 0.83 mg/dL (ref 0.57–1.00)
GFR calc Af Amer: 90 mL/min/{1.73_m2} (ref >59)
GFR calc non Af Amer: 78 mL/min/{1.73_m2} (ref >59)
Globulin, Total: 1.9 g/dL (ref 1.5–4.5)
Glucose: 78 mg/dL (ref 65–99)
Potassium: 4.1 mmol/L (ref 3.5–5.2)
Sodium: 141 mmol/L (ref 134–144)
Total Protein: 6.7 g/dL (ref 6.0–8.5)

## 2019-08-10 LAB — LIPID PANEL W/O CHOL/HDL RATIO
Cholesterol, Total: 183 mg/dL (ref 100–199)
HDL: 56 mg/dL (ref >39)
LDL Calculated: 107 mg/dL — ABNORMAL HIGH (ref 0–99)
Triglycerides: 100 mg/dL (ref 0–149)
VLDL Cholesterol Cal: 20 mg/dL (ref 5–40)

## 2019-08-10 LAB — URIC ACID: Uric Acid: 4.1 mg/dL (ref 2.5–7.1)

## 2019-08-10 LAB — SEDIMENTATION RATE: Sed Rate: 10 mm/hr (ref 0–40)

## 2019-08-10 LAB — RHEUMATOID FACTOR: Rheumatoid fact SerPl-aCnc: <10 IU/mL (ref 0.0–13.9)

## 2019-08-10 LAB — ANA W/REFLEX IF POSITIVE: Anti Nuclear Antibody (ANA): NEGATIVE

## 2019-08-30 ENCOUNTER — Ambulatory Visit: Payer: Self-pay | Admitting: Pharmacist

## 2019-08-30 ENCOUNTER — Other Ambulatory Visit: Payer: Self-pay | Admitting: Nurse Practitioner

## 2019-08-30 DIAGNOSIS — G8929 Other chronic pain: Secondary | ICD-10-CM

## 2019-08-30 DIAGNOSIS — J45909 Unspecified asthma, uncomplicated: Secondary | ICD-10-CM

## 2019-08-30 DIAGNOSIS — F324 Major depressive disorder, single episode, in partial remission: Secondary | ICD-10-CM

## 2019-08-30 MED ORDER — ALBUTEROL SULFATE HFA 108 (90 BASE) MCG/ACT IN AERS: 2.0000 | INHALATION_SPRAY | Freq: Four times a day (QID) | RESPIRATORY_TRACT | 2 refills | Status: DC | PRN

## 2019-08-30 MED ORDER — BUDESONIDE-FORMOTEROL FUMARATE 160-4.5 MCG/ACT IN AERO: 2.0000 | INHALATION_SPRAY | Freq: Two times a day (BID) | RESPIRATORY_TRACT | 3 refills | Status: DC

## 2019-08-30 MED ORDER — LOVASTATIN 20 MG PO TABS: 20.0000 mg | ORAL_TABLET | Freq: Every day | ORAL | 2 refills | Status: DC

## 2019-08-30 MED ORDER — AZELASTINE HCL 0.05 % OP SOLN: 1.0000 [drp] | Freq: Two times a day (BID) | OPHTHALMIC | 2 refills | Status: DC

## 2019-08-30 MED ORDER — FLUOXETINE HCL 20 MG PO CAPS: 20.0000 mg | ORAL_CAPSULE | Freq: Every day | ORAL | 2 refills | Status: DC

## 2019-08-30 MED ORDER — BUPROPION HCL ER (XL) 150 MG PO TB24: 150.0000 mg | ORAL_TABLET | Freq: Every day | ORAL | 2 refills | Status: DC

## 2019-08-30 MED ORDER — OMEPRAZOLE 40 MG PO CPDR: 40.0000 mg | DELAYED_RELEASE_CAPSULE | Freq: Every evening | ORAL | 10 refills | Status: DC

## 2019-08-30 MED ORDER — MONTELUKAST SODIUM 10 MG PO TABS: 10.0000 mg | ORAL_TABLET | Freq: Every day | ORAL | 5 refills | Status: DC

## 2019-08-30 MED ORDER — FLUTICASONE PROPIONATE 50 MCG/ACT NA SUSP: 2.0000 | Freq: Every day | NASAL | 10 refills | Status: DC

## 2019-08-30 MED ORDER — MELOXICAM 15 MG PO TABS: 15.0000 mg | ORAL_TABLET | ORAL | 0 refills | Status: DC | PRN

## 2019-08-30 MED ORDER — LEVOTHYROXINE SODIUM 125 MCG PO TABS: 125.0000 ug | ORAL_TABLET | Freq: Every day | ORAL | 2 refills | Status: DC

## 2019-08-30 MED ORDER — TRAZODONE HCL 50 MG PO TABS: 50.0000 mg | ORAL_TABLET | Freq: Every evening | ORAL | 2 refills | Status: DC | PRN

## 2019-08-30 MED ORDER — IPRATROPIUM-ALBUTEROL 0.5-2.5 (3) MG/3ML IN SOLN: 3.0000 mL | Freq: Four times a day (QID) | RESPIRATORY_TRACT | 2 refills | Status: DC | PRN

## 2019-08-30 NOTE — Progress Notes (Signed)
Medications to medication management clinic

## 2019-08-30 NOTE — Patient Instructions (Signed)
Visit Information  Goals Addressed            This Visit's Progress     Patient Stated   . PharmD "I can't afford a lot" (pt-stated)       Current Barriers:  . Polypharmacy; complex patient with multiple comorbidities including asthma, hypothyroidism, depression, chronic pain, insomnia  . Reports that she is uninsured. Confirms that she lives in [place]. Notes that she believes she was previously referred to [facility] when she was admitted at [facility], but she didn't have her most recent tax return and didn't think she could get assistance . Also note concerns affording rent, utilities, and food. Notes that she and her daughter live together, but have a difficult time with finances  Pharmacist Clinical Goal(s):  Marland Kitchen Over the next 90 days, patient will work with PharmD and provider towards optimized medication management  Interventions: . Patient should qualify for assistance from [facility]. Sent message to Langley Adie, PharmD, to outreach patient to discuss application process. Will collaborate with Marnee Guarneri, NP to send all medications electronically to [facility] . Marland Kitchen Patient reports financial concerns. Placed C3 referral for assistance with rent, utilities, and food . Will collaborate with RN CM and LCSW on comprehensive care . Will focus on disease management moving forward once medication access concerns are relieved.   Patient Self Care Activities:  . Patient will take medications as prescribed  Initial goal documentation        The patient verbalized understanding of instructions provided today and declined a print copy of patient instruction materials.   Plan: - Will collaborate with interdisciplinary care team as above - Will outreach patient in 4-6 weeks for continued medication management support  Catie Darnelle Maffucci, PharmD Clinical Pharmacist [place] [phone]

## 2019-08-30 NOTE — Chronic Care Management (AMB) (Signed)
Chronic Care Management   Note  08/30/2019 Name: Audrey Richard MRN: [id number] DOB: [birth year]   Subjective:  Audrey Richard is a 58 y.o. year old female who is a primary care patient of Cannady, Dorie RankJolene T, NP. The CCM team was consulted for assistance with chronic disease management and care coordination needs.    Outreached patient telephonically to discuss medication access concerns.   Review of patient status, including review of consultants reports, laboratory and other test data, was performed as part of comprehensive evaluation and provision of chronic care management services.   Objective:  Lab Results  Component Value Date   CREATININE 0.83 08/08/2019   CREATININE 1.15 (H) 11/24/2018   CREATININE 0.93 11/17/2018       Component Value Date/Time   CHOL 183 08/08/2019 1642   TRIG 100 08/08/2019 1642   HDL 56 08/08/2019 1642   LDLCALC 107 (H) 08/08/2019 1642    Clinical ASCVD: No  The 10-year ASCVD risk score Denman George(Goff DC Jr., et al., 2013) is: 2.1%   Values used to calculate the score:     Age: [age] years     Sex: Female     Is Non-Hispanic African American: No     Diabetic: No     Tobacco smoker: No     Systolic Blood Pressure: 117 mmHg     Is BP treated: No     HDL Cholesterol: 56 mg/dL     Total Cholesterol: 183 mg/dL    BP Readings from Last 3 Encounters:  08/08/19 117/77  01/04/19 125/85  11/26/18 140/82    Allergies  Allergen Reactions  . Biaxin [Clarithromycin]   . Iodine     Medications Reviewed Today    Reviewed by Marjie Skiffannady, Jolene T, NP (Nurse Practitioner) on 08/08/19 at 1637  Med List Status: <None>  Medication Order Taking? Sig Documenting Provider Last Dose Status Informant  albuterol (VENTOLIN HFA) 108 (90 Base) MCG/ACT inhaler [id number] Yes Inhale 2 puffs into the lungs every 6 (six) hours as needed for wheezing or shortness of breath. Aura Dialsannady, Jolene T, NP Taking Active   azelastine (OPTIVAR) 0.05 % ophthalmic solution [id number] Yes Place  1 drop into both eyes 2 (two) times daily. Aura Dialsannady, Jolene T, NP Taking Active   budesonide-formoterol [facility](SYMBICORT) 160-4.5 MCG/ACT inhaler [id number] Yes Inhale 2 puffs into the lungs 2 (two) times daily. Aura Dialsannady, Jolene T, NP Taking Active   buPROPion (WELLBUTRIN XL) 150 MG 24 hr tablet [id number] Yes Take 1 tablet (150 mg total) by mouth daily. Aura Dialsannady, Jolene T, NP Taking Active   diphenhydrAMINE (BENADRYL) 25 mg capsule [id number] No Take 25 mg by mouth every 6 (six) hours as needed. [provider] Not Taking Active   FLUoxetine (PROZAC) 20 MG capsule [id number] Yes Take 1 capsule (20 mg total) by mouth daily. Aura Dialsannady, Jolene T, NP Taking Active   fluticasone (FLONASE) 50 MCG/ACT nasal spray [id number] No Place 2 sprays into both nostrils daily.  Patient not taking: Reported on 08/08/2019   Aura Dialsannady, Jolene T, NP Not Taking Active   ipratropium-albuterol (DUONEB) 0.5-2.5 (3) MG/3ML SOLN [id number] No Take 3 mLs by nebulization every 6 (six) hours as needed.  Patient not taking: Reported on 08/08/2019   Aura Dialsannady, Jolene T, NP Not Taking Active   levothyroxine (SYNTHROID) 125 MCG tablet [id number] Yes Take 1 tablet (125 mcg total) by mouth daily. Aura Dialsannady, Jolene T, NP Taking Active   lovastatin (MEVACOR) 20 MG tablet [id number] Yes Take 1 tablet (20 mg total) by mouth at  bedtime. For further refills needs to see provider. Marnee Guarneri T, NP  Active   montelukast (SINGULAIR) 10 MG tablet [id number] Yes Take 1 tablet (10 mg total) by mouth at bedtime. Marnee Guarneri T, NP Taking Active   omeprazole (PRILOSEC) 40 MG capsule [id number] No Take 1 capsule (40 mg total) by mouth every evening.  Patient not taking: Reported on 08/08/2019   Marnee Guarneri T, NP Not Taking Active   traZODone (DESYREL) 50 MG tablet [id number] Yes Take 1 tablet (50 mg total) by mouth at bedtime as needed. Venita Lick, NP Taking Active            Assessment:   Goals Addressed            This Visit's Progress      Patient Stated   . PharmD "I can't afford a lot" (pt-stated)       Current Barriers:  . Polypharmacy; complex patient with multiple comorbidities including asthma, hypothyroidism, depression, chronic pain, insomnia  . Reports that she is uninsured. Confirms that she lives in [place]. Notes that she believes she [facility] at [facility], but she didn't have her most recent tax return and didn't think she could get assistance . Also note concerns affording rent, utilities, and food. Notes that she and her daughter live together, but have a difficult time with finances  Pharmacist Clinical Goal(s):  Marland Kitchen Over the next 90 days, patient will work with PharmD and provider towards optimized medication management  Interventions: . Patient should qualify for assistance from [facility]. Sent message to Langley Adie, PharmD, to outreach patient to discuss application process. Will collaborate with Marnee Guarneri, NP to send all medications electronically to [facility] . Marland Kitchen Patient reports financial concerns. Placed C3 referral for assistance with rent, utilities, and food . Will collaborate with RN CM and LCSW on comprehensive care . Will focus on disease management moving forward once medication access concerns are relieved.   Patient Self Care Activities:  . Patient will take medications as prescribed  Initial goal documentation        Plan: - Will collaborate with interdisciplinary care team as above - Will outreach patient in 4-6 weeks for continued medication management support  Catie Darnelle Maffucci, PharmD Clinical Pharmacist [place] [phone]

## 2019-08-31 ENCOUNTER — Telehealth: Payer: Self-pay

## 2019-09-01 ENCOUNTER — Telehealth: Payer: Self-pay | Admitting: Nurse Practitioner

## 2019-09-01 ENCOUNTER — Other Ambulatory Visit: Payer: Self-pay | Admitting: Nurse Practitioner

## 2019-09-01 MED ORDER — ROSUVASTATIN CALCIUM 20 MG PO TABS: 20.0000 mg | ORAL_TABLET | Freq: Every day | ORAL | 3 refills | Status: DC

## 2019-09-01 NOTE — Telephone Encounter (Signed)
Routing to provider. Lovastatin not available at the pharmacy.

## 2019-09-01 NOTE — Telephone Encounter (Signed)
Audrey Richard calling [facility] in [place] is requesting if the  lovastatin (MEVACOR) 20 MG tablet [[id number]]   Can be changed to another statin drug. Unable to get at the pharmacy.  Please [phone]-Fax   CB- [phone]

## 2019-09-01 NOTE — Telephone Encounter (Signed)
Sent in script for Crestor.

## 2019-09-06 ENCOUNTER — Telehealth: Payer: Self-pay

## 2019-09-08 ENCOUNTER — Encounter (INDEPENDENT_AMBULATORY_CARE_PROVIDER_SITE_OTHER): Payer: Self-pay

## 2019-09-08 ENCOUNTER — Other Ambulatory Visit: Payer: Self-pay

## 2019-09-08 ENCOUNTER — Ambulatory Visit: Payer: Self-pay | Admitting: Pharmacy Technician

## 2019-09-08 DIAGNOSIS — Z79899 Other long term (current) drug therapy: Secondary | ICD-10-CM

## 2019-09-08 NOTE — Progress Notes (Signed)
Met with patient completed financial assistance application for [facility].  Patient agreed to be responsible for gathering financial information and forwarding to appropriate department in [facility].    Completed [facility] application and contract.  Patient agreed to all terms of the [facility] contract.    Patient approved to receive medication assistance at [facility] as long as eligibility criteria continues to be met.    Provided patient with community resource material based on her particular needs.    Ventolin & Symbicort Prescription Applications completed with patient.  Forwarded to [employer] at [facility] for signature.  Upon receipt of signed applications from provider, PAP applications will be submitted to pharmaceutical companies.  [facility]

## 2019-09-14 ENCOUNTER — Ambulatory Visit: Payer: Self-pay | Admitting: Licensed Clinical Social Worker

## 2019-09-14 NOTE — Chronic Care Management (AMB) (Signed)
  Care Management Note   Audrey Richard is a 58 y.o. year old female who is a primary care patient of Cannady, Barbaraann Faster, NP. The CM team was consulted for assistance with chronic disease management and care coordination.   I reached out to Audrey Richard by phone today. Patient reports working actively with Audrey Richard, CM at [facility]. Patient reports that she is interested in gaining resource assistance for insurance options as she currently has no coverage. Patient has completed [facility] Application already. Patient was educated on [employer]. Patient was agreeable to Audrey Richard Referral. Referral completed.   Follow Up Plan: The patient will call LCSW* as advised to any further social work needs or case management needs arise.   Eula Fried, BSW, MSW, [facility] [email or web address] Phone: [phone]

## 2019-09-21 ENCOUNTER — Telehealth: Payer: Self-pay

## 2019-09-22 ENCOUNTER — Telehealth: Payer: Self-pay | Admitting: Pharmacist

## 2019-09-22 NOTE — Telephone Encounter (Signed)
09/22/2019 9:45:53 AM - Symbicort faxed to [place]  09/22/2019 [place] application for Symbicort 160/4.95mcg Inhale 2 puffs into the lungs two times daily.AJ   09/22/2019 9:44:51 AM - Ventolin faxed to [place]  24/01/6833 Faxed [employer] application for enrollment on Ventolin HFA 41mcg Inhale 2 puffs into the lungs every 6 hours as needed for wheezing or shortness of breath.Delos Haring

## 2019-09-27 ENCOUNTER — Telehealth: Payer: Self-pay

## 2019-09-27 ENCOUNTER — Ambulatory Visit: Payer: Self-pay | Admitting: *Deleted

## 2019-09-27 DIAGNOSIS — J45909 Unspecified asthma, uncomplicated: Secondary | ICD-10-CM

## 2019-09-27 NOTE — Chronic Care Management (AMB) (Signed)
  Chronic Care Management   Outreach Note  09/27/2019 Name: Audrey Richard MRN: [id number] DOB: [birth year]  Referred by: Venita Lick, NP Reason for referral : Chronic Care Management (Unsuccessful Outreachx1)   An unsuccessful telephone outreach was attempted today. The patient was referred to the case management team by for assistance with care management and care coordination.   Follow Up Plan: A HIPPA compliant phone message was left for the patient providing contact information and requesting a return call.  The care management team will reach out to the patient again over the next 30 days.   Merlene Morse Dorothie Wah RN, BSN Nurse Case Editor, [facility]  [phone]) Business Mobile

## 2019-09-29 ENCOUNTER — Other Ambulatory Visit: Payer: Self-pay

## 2019-09-29 ENCOUNTER — Ambulatory Visit: Payer: Self-pay | Admitting: Gerontology

## 2019-09-29 VITALS — BP 134/87 | HR 66 | Temp 98.2°F | Ht 67.0 in | Wt 172.0 lb

## 2019-09-29 DIAGNOSIS — M150 Primary generalized (osteo)arthritis: Secondary | ICD-10-CM | POA: Insufficient documentation

## 2019-09-29 DIAGNOSIS — F324 Major depressive disorder, single episode, in partial remission: Secondary | ICD-10-CM

## 2019-09-29 DIAGNOSIS — J309 Allergic rhinitis, unspecified: Secondary | ICD-10-CM

## 2019-09-29 DIAGNOSIS — E785 Hyperlipidemia, unspecified: Secondary | ICD-10-CM

## 2019-09-29 DIAGNOSIS — Z0000 Encounter for general adult medical examination without abnormal findings: Secondary | ICD-10-CM | POA: Insufficient documentation

## 2019-09-29 DIAGNOSIS — Z7689 Persons encountering health services in other specified circumstances: Secondary | ICD-10-CM | POA: Insufficient documentation

## 2019-09-29 DIAGNOSIS — E039 Hypothyroidism, unspecified: Secondary | ICD-10-CM

## 2019-09-29 DIAGNOSIS — K219 Gastro-esophageal reflux disease without esophagitis: Secondary | ICD-10-CM | POA: Insufficient documentation

## 2019-09-29 DIAGNOSIS — J45909 Unspecified asthma, uncomplicated: Secondary | ICD-10-CM

## 2019-09-29 MED ORDER — MELOXICAM 15 MG PO TABS: 15.0000 mg | ORAL_TABLET | ORAL | 0 refills | Status: DC | PRN

## 2019-09-29 NOTE — Progress Notes (Signed)
Patient ID: Audrey Richard, female   DOB: 01/19/1961, 58 y.o.   MRN: [id number]  Chief Complaint  Patient presents with  . Establish Care  . Hyperlipidemia  . Depression    HPI Audrey Richard is a 58 y.o. female who presents to establish care and evaluation of her chronic conditions. She reports that she has a history of hypothyroidism and takes 125 mcg of Levothyroxine in the evening. Her TSH level done 1 month ago was 1.930, she denies cold or heat intolerance, fatigue, constipation, palpitation and lower extremity edema. Her Lipid panel one month ago was normal except LDL that was 107 mg/dl, she continues to take rosuvastatin 20 mg daily, reports that she adheres with healthy diet, exercises as tolerated and denies myalgia.  She also endorses having a history of generalized chronic osteoarthritis to her lower back, hips, fingers and knees. She describes her pain as dull achy and non radiating to her joints. She states that the pain increases in intensity from 5-7 and taking Meloxicam 15 mg as needed takes the edge off the pain. She states that pain wakes her up at night and her pain worsened due to prepping food at [employer].  She also reports having a history of Asthma that's controlled and seasonal allergy with itchy eyes, and using otc Artificial tears relieves symptoms.She states that her last Asthma exacerbation was 5 months ago, and her breathing is stable. She uses Albuterol as needed and reports stopping her Symbicort because of an episode of acid reflux which occurred after using it 4 months ago. Currently she states that her acid reflux is controlled with taking Omeprazole 40 mg every evening. She reports that her mood fluctuates and denies suicidal or homicidal ideation. She requests mental health follow up and continues to take Trazodone 50 mg, Fluoxetine 20 mg and Bupropion 150 mg daily. She states that she has not had mammogram or pap smear done, and requests influenza vaccine. She  reports that she's doing well and offers no further concerns.   Past Medical History:  Diagnosis Date  . Asthma   . Depression   . High cholesterol   . Osteoporosis   . Thyroid disease     Past Surgical History:  Procedure Laterality Date  . TUBAL LIGATION      Family History  Problem Relation Age of Onset  . Heart disease Mother   . Heart disease Father   . Diabetes Sister   . Diabetes Maternal Grandmother   . Heart disease Maternal Grandmother   . Diabetes Maternal Grandfather   . Heart disease Maternal Grandfather     Social History Social History   Tobacco Use  . Smoking status: Former Smoker    Quit date: 11/15/2005    Years since quitting: 13.8  . Smokeless tobacco: Never Used  Substance Use Topics  . Alcohol use: Not Currently    Comment: pt states on occasion  . Drug use: No    Allergies  Allergen Reactions  . Biaxin [Clarithromycin]   . Iodine     Current Outpatient Medications  Medication Sig Dispense Refill  . albuterol (VENTOLIN HFA) 108 (90 Base) MCG/ACT inhaler Inhale 2 puffs into the lungs every 6 (six) hours as needed for wheezing or shortness of breath. 16 g 2  . azelastine (OPTIVAR) 0.05 % ophthalmic solution Place 1 drop into both eyes 2 (two) times daily. 6 mL 2  . budesonide-formoterol (SYMBICORT) 160-4.5 MCG/ACT inhaler Inhale 2 puffs into the lungs 2 (two) times  daily. 1 Inhaler 3  . buPROPion (WELLBUTRIN XL) 150 MG 24 hr tablet Take 1 tablet (150 mg total) by mouth daily. 90 tablet 2  . diphenhydrAMINE (BENADRYL) 25 mg capsule Take 25 mg by mouth every 6 (six) hours as needed.    Marland Kitchen. FLUoxetine (PROZAC) 20 MG capsule Take 1 capsule (20 mg total) by mouth daily. 90 capsule 2  . fluticasone (FLONASE) 50 MCG/ACT nasal spray Place 2 sprays into both nostrils daily. 16 g 10  . ipratropium-albuterol (DUONEB) 0.5-2.5 (3) MG/3ML SOLN Take 3 mLs by nebulization every 6 (six) hours as needed. 360 mL 2  . levothyroxine (SYNTHROID) 125 MCG tablet  Take 1 tablet (125 mcg total) by mouth daily. 90 tablet 2  . meloxicam (MOBIC) 15 MG tablet Take 1 tablet (15 mg total) by mouth as needed for pain (as needed once a day for pain). 30 tablet 0  . montelukast (SINGULAIR) 10 MG tablet Take 1 tablet (10 mg total) by mouth at bedtime. 30 tablet 5  . omeprazole (PRILOSEC) 40 MG capsule Take 1 capsule (40 mg total) by mouth every evening. 30 capsule 10  . rosuvastatin (CRESTOR) 20 MG tablet Take 1 tablet (20 mg total) by mouth daily. 90 tablet 3  . traZODone (DESYREL) 50 MG tablet Take 1 tablet (50 mg total) by mouth at bedtime as needed. 30 tablet 2   No current facility-administered medications for this visit.     Review of Systems Review of Systems  Constitutional: Negative.   HENT: Negative.   Eyes: Positive for itching (seasonal allergy and asthma.).  Respiratory: Negative.   Cardiovascular: Negative.   Gastrointestinal: Negative.   Endocrine: Negative.  Negative for cold intolerance.  Musculoskeletal: Positive for arthralgias (chronic arthritis to hips, fingers and knees).  Skin: Negative.   Neurological: Negative.   Psychiatric/Behavioral: Positive for dysphoric mood.    Blood pressure 134/87, pulse 66, temperature 98.2 F (36.8 C), height 5\' 7"  (1.702 m), weight 172 lb (78 kg), SpO2 95 %.  Physical Exam Physical Exam Constitutional:      Appearance: Normal appearance.  HENT:     Head: Normocephalic and atraumatic.     Nose: Nose normal.     Mouth/Throat:     Mouth: Mucous membranes are moist.  Eyes:     Extraocular Movements: Extraocular movements intact.     Pupils: Pupils are equal, round, and reactive to light.  Neck:     Musculoskeletal: Normal range of motion.  Cardiovascular:     Rate and Rhythm: Normal rate and regular rhythm.     Pulses: Normal pulses.     Heart sounds: Normal heart sounds.  Pulmonary:     Effort: Pulmonary effort is normal.     Breath sounds: Normal breath sounds.  Abdominal:     General:  Bowel sounds are normal.     Palpations: Abdomen is soft.  Genitourinary:    Comments: Deferred per patient Musculoskeletal: Normal range of motion.  Skin:    General: Skin is warm and dry.  Neurological:     General: No focal deficit present.     Mental Status: She is alert and oriented to person, place, and time. Mental status is at baseline.  Psychiatric:        Mood and Affect: Mood normal.        Behavior: Behavior normal.        Thought Content: Thought content normal.        Judgment: Judgment normal.  Data Reviewed -Labs and past medical history was reviewed.  Assessment and Plan   1. Persistent asthma without complication, unspecified asthma severity - She was advised to start taking Symbicort 2 puffs bid and notify clinic next week with any symptoms of acid reflux. -She was advised to notify clinic for worsening symptoms.  2. Allergic rhinitis, unspecified seasonality, unspecified trigger - She will continue on current treatment regimen and notify clinic for worsening symptoms.  3. Hypothyroidism, unspecified type - She was advised to take Levothyroxine in the morning 60 minutes before breakfast. She was advised to notify clinic for any symptoms. - Thyroid Panel With TSH; Future  4. Major depressive disorder with single episode, in partial remission (HCC) - She will continue on current treatment regimen and will follow up with Ms. Hilbert Corrigan for mental health counseling.  5. Hyperlipidemia, unspecified hyperlipidemia type - She will continue on current treatment regimen and advised to continue on -Low fat Diet, like low fat dairy products eg skimmed milk -Avoid any fried food -Regular exercise/walk -Goal for Total Cholesterol is less than 200 -Goal for bad cholesterol LDL is less than 100 -Goal for Good cholesterol HDL is more than 45 -Goal for Triglyceride is less than 150    6. Gastroesophageal reflux disease, unspecified whether esophagitis present -  She will continue on current treatment regimen and  -Avoid spicy, fatty and fried food -Avoid sodas and sour juices -Avoid heavy meals -Avoid eating 4 hours before bedtime -Elevate head of bed at night    7. Primary generalized (osteo)arthritis  -She will continue on meloxicam (MOBIC) 15 MG tablet; Take 1 tablet (15 mg total) by mouth as needed for pain (as needed once a day for pain).  Dispense: 30 tablet; Refill: 0 - Ambulatory referral to Orthopedic Surgery  8. Encounter to establish care - Ambulatory referral to Hematology / Oncology for Mammogram and Pap smear. - Flu Vaccine QUAD 6+ mos PF IM (Fluarix Quad PF) was administered.   Follow up: in 6 weeks ( 11/10/2019) or if symptoms continues or fail to improve.  Feather Berrie E Edmund Rick 10/01/2019, 4:38 PM

## 2019-09-30 ENCOUNTER — Telehealth: Payer: Self-pay | Admitting: Nurse Practitioner

## 2019-09-30 NOTE — Telephone Encounter (Signed)
Noted, I did not refer her there but am glad she has this location at this time and hope she will return to [facility] in future if circumstances change.

## 2019-09-30 NOTE — Telephone Encounter (Signed)
Copied from [place] [phone]. Topic: General - Inquiry >> Sep 30, 2019 10:04 AM Lennox Solders wrote: Reason for CRM: pt is calling to thank Lucely Leard [facility]. Pt does not have insurance

## 2019-10-04 ENCOUNTER — Ambulatory Visit: Payer: Self-pay | Admitting: Pharmacist

## 2019-10-04 ENCOUNTER — Telehealth: Payer: Self-pay

## 2019-10-04 NOTE — Chronic Care Management (AMB) (Signed)
  Chronic Care Management   Note  10/04/2019 Name: Audrey Richard MRN: [id number] DOB: [birth year]  Audrey Richard is a 58 y.o. year old female who is a primary care patient of [place], Chioma E, NP. The CCM team was consulted for assistance with chronic disease management and care coordination needs.   Upon chart review, can see that patient established care at [facility]. I have removed CCM team members from her care team and will collaborate with team members to cancel follow up.   CCM team would be happy to collaborate in care of this patient if she receives care from the clinic in the future.     Catie Darnelle Maffucci, PharmD Clinical Pharmacist [place] [phone]

## 2019-10-05 ENCOUNTER — Other Ambulatory Visit: Payer: Self-pay

## 2019-10-05 DIAGNOSIS — Z20822 Contact with and (suspected) exposure to covid-19: Secondary | ICD-10-CM

## 2019-10-06 LAB — NOVEL CORONAVIRUS, NAA: SARS-CoV-2, NAA: NOT DETECTED

## 2019-10-20 ENCOUNTER — Ambulatory Visit: Payer: Self-pay | Admitting: Licensed Clinical Social Worker

## 2019-10-20 ENCOUNTER — Other Ambulatory Visit: Payer: Self-pay

## 2019-10-20 DIAGNOSIS — F411 Generalized anxiety disorder: Secondary | ICD-10-CM

## 2019-10-20 DIAGNOSIS — F331 Major depressive disorder, recurrent, moderate: Secondary | ICD-10-CM
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
## 2019-10-24 ENCOUNTER — Ambulatory Visit: Payer: Self-pay | Admitting: Pharmacist

## 2019-10-24 ENCOUNTER — Other Ambulatory Visit: Payer: Self-pay

## 2019-10-24 DIAGNOSIS — Z79899 Other long term (current) drug therapy: Secondary | ICD-10-CM

## 2019-10-24 NOTE — Progress Notes (Signed)
  Medication Management Clinic Visit Note  [name] MRN: [id number] Date of Birth: 12-31-1960 PCP: Audrey Reusing, NP   Audrey Richard 58 y.o. female presents for a annual MTM visit today.  LMP  (LMP Unknown)   Patient Information   Past Medical History:  Diagnosis Date  . Asthma   . Depression   . High cholesterol   . Osteoporosis   . Thyroid disease       Past Surgical History:  Procedure Laterality Date  . TUBAL LIGATION       Family History  Problem Relation Age of Onset  . Heart disease Mother   . Heart disease Father   . Diabetes Sister   . Diabetes Maternal Grandmother   . Heart disease Maternal Grandmother   . Diabetes Maternal Grandfather   . Heart disease Maternal Grandfather     New Diagnoses (since last visit):   Diet: Breakfast:  Oatmeal, instant grits, bacon, or eggs  Lunch:  Chicken, or salad if time Dinner:  Fried chicken, hamburger, vegetables, or fish            Social History   Substance and Sexual Activity  Alcohol Use Not Currently   Comment: pt states on occasion      Social History   Tobacco Use  Smoking Status Former Smoker  . Quit date: 11/15/2005  . Years since quitting: 13.9  Smokeless Tobacco Never Used      Health Maintenance  Topic Date Due  . COLONOSCOPY  07/06/2011  . MAMMOGRAM  03/19/2015  . PAP SMEAR-Modifier  05/17/2018  . TETANUS/TDAP  10/20/2019  . INFLUENZA VACCINE  Completed  . Hepatitis C Screening  Completed  . HIV Screening  Completed     Assessment and Plan:  Asthma: Symbicort 1 puff twice daily, albuterol for rescue.  Patient states she uses albuterol once weekly for wheezing.  Patient is also on Duoneb nebulizer - patient states that she requires this twice monthly, or when she is sick.  Patient also takes montelukast.  Allergies: Flonase  MDD:  Fluoxetine, bupropion.  Patient reports recently loss of her father to COVID-19.  Hypothyroid:   Levothyroxine  Cholesterol:  Crestor 20 mg at night.  No history of MI.  Recent lipid panel (07/2019) -- Total cholesterol 183 -- Tg 100 -- HDL 56 -- LDL 107  Acid Reflux:  Omeprazole  Insomnia:  Trazodone  Chronic pain:  Patient reports pain in back and legs.  Patient currently on meloxicam 15 mg once daily.  Patient educated to try tylenol 500 mg 1-2 tablets Q6 hours PRN for additional pain relief.  Avoid motrin, advil, and aleve.     Gerald Dexter, PharmD Pharmacy Resident  10/24/2019 4:57 PM

## 2019-10-25 ENCOUNTER — Other Ambulatory Visit: Payer: Self-pay

## 2019-10-28 ENCOUNTER — Telehealth: Payer: Self-pay

## 2019-11-01 ENCOUNTER — Other Ambulatory Visit: Payer: Self-pay

## 2019-11-01 ENCOUNTER — Ambulatory Visit: Payer: Self-pay | Admitting: Specialist

## 2019-11-01 DIAGNOSIS — M150 Primary generalized (osteo)arthritis: Secondary | ICD-10-CM

## 2019-11-01 NOTE — Progress Notes (Signed)
°  Subjective:     Patient ID: Audrey Richard, female   DOB: 1961/06/28, 58 y.o.   MRN: [id number]  HPI 58 year old female with several body areas involved. She has been told she has OA but no imaging has been done. She is on Meloxicam with some relief.   Her main complaint is right side thorasic back pain. I am going to concentrate on this area initially.  Review of Systems     Objective:   Physical Exam She has a Nl gate. She is able to stand on toes/heels but walking is difficult because of balance. She is able to accomplish tandem gate. On inspection, she may have a mild scolotic curve. She is TTP to mild touch over the right parathorasic area. She is able to march in pain with normal muscle recruitment and relaxation. Combined thoracolumbar flexion is diminished to the right. She has 40 degrees of forward flexion and 40 degrees of inflexion. She has 30 degrees of R and L truncal rotation.    Assessment:         Plan:        Rx: Xray AP, and LAT T/L Spine  Return after X-Ray

## 2019-11-02 ENCOUNTER — Other Ambulatory Visit: Payer: Self-pay

## 2019-11-03 ENCOUNTER — Other Ambulatory Visit: Payer: Self-pay

## 2019-11-03 ENCOUNTER — Ambulatory Visit: Payer: Self-pay | Admitting: Licensed Clinical Social Worker

## 2019-11-03 DIAGNOSIS — E039 Hypothyroidism, unspecified: Secondary | ICD-10-CM

## 2019-11-03 DIAGNOSIS — F331 Major depressive disorder, recurrent, moderate: Secondary | ICD-10-CM

## 2019-11-03 DIAGNOSIS — F411 Generalized anxiety disorder: Secondary | ICD-10-CM

## 2019-11-03 NOTE — BH Specialist Note (Signed)
Integrated Behavioral Health Follow Up Visit Via Phone  MRN: [id number] Name: Audrey Richard  Number of [place] Clinician visits: 1/6  Type of Service: [place] Interpretor:No. Interpretor Name and Language: Not applicable.   SUBJECTIVE: Audrey Richard is a 58 y.o. female accompanied by herself. Patient was referred by Carlyon Shadow NP for mental health. Patient reports the following symptoms/concerns: She notes that she has been busy with work. She explains that she had a little depression about the upcoming holidays due to her father passing away two weeks ago. She notes that she will probably just celebrate with immediate family this year. She reports that things will be different with the holidays due to COVID 19. She notes that she saw the orthopedist who recommended she have X ray's and will follow up again with him. She notes that she still worries about things but tries to let things go. She denies suicidal and homicidal thoughts.  Duration of problem: ; Severity of problem: moderate  OBJECTIVE: Mood: Euthymic and Affect: Appropriate Risk of harm to self or others: No plan to harm self or others  LIFE CONTEXT: Family and Social: see above. School/Work: see above. Self-Care: see above. Life Changes: see above.  GOALS ADDRESSED: Patient will: 1.  Reduce symptoms of: anxiety  2.  Increase knowledge and/or ability of: self-management skills and stress reduction  3.  Demonstrate ability to: Increase healthy adjustment to current life circumstances  INTERVENTIONS: Interventions utilized:  Supportive Counseling was utilized by the clinician focusing on the patient's anxiety and affect on normal cognition. Clinician processed with the patient regarding how she has been doing since the last follow up session. Clinician explained to the patient that she can only control her own thoughts, feelings, and actions. Clinician  explained to the patient that she is unable to control anyone else's actions. Clinician encouraged the patient to focus on what is in her control versus what is out of her control.  Standardized Assessments completed: GAD-7 and PHQ 9  ASSESSMENT: Patient currently experiencing see above.   Patient may benefit from see above.  PLAN: 1. Follow up with behavioral health clinician on : two weeks or earlier if needed.  2. Behavioral recommendations: see above.  3. Referral(s): [place] (In Clinic) 4. "From scale of 1-10, how likely are you to follow plan?":   Bayard Hugger, LCSW

## 2019-11-04 LAB — THYROID PANEL WITH TSH
Free Thyroxine Index: 2.8 (ref 1.2–4.9)
T3 Uptake Ratio: 27 % (ref 24–39)
T4, Total: 10.4 ug/dL (ref 4.5–12.0)
TSH: 1.75 u[IU]/mL (ref 0.450–4.500)

## 2019-11-16 ENCOUNTER — Other Ambulatory Visit: Payer: Self-pay | Admitting: Gerontology

## 2019-11-16 DIAGNOSIS — M150 Primary generalized (osteo)arthritis: Secondary | ICD-10-CM

## 2019-11-21 ENCOUNTER — Ambulatory Visit: Payer: Self-pay | Admitting: Nurse Practitioner

## 2019-11-22 ENCOUNTER — Other Ambulatory Visit: Payer: Self-pay

## 2019-11-22 ENCOUNTER — Ambulatory Visit: Payer: Self-pay | Admitting: Licensed Clinical Social Worker

## 2019-11-22 DIAGNOSIS — F331 Major depressive disorder, recurrent, moderate: Secondary | ICD-10-CM

## 2019-11-22 DIAGNOSIS — G8929 Other chronic pain: Secondary | ICD-10-CM

## 2019-11-22 DIAGNOSIS — F411 Generalized anxiety disorder: Secondary | ICD-10-CM

## 2019-11-22 NOTE — BH Specialist Note (Signed)
Integrated Behavioral Health Follow Up Visit Via Phone   MRN: [id number] Name: Audrey Richard  Number of [place] Clinician visits: 2/6  Type of Service: [place] Interpretor:No. Interpretor Name and Language:   SUBJECTIVE: Audrey Richard is a 58 y.o. female accompanied by herself.  Patient was referred by Carlyon Shadow NP for mental health. Patient reports the following symptoms/concerns: She stated she needed refills on her psychotropic medications but did not know who she was supposed to call. She reports that her boss is giving her extra hours to be able to get Christmas presents for her family. She notes that her oven died prior to Thanksgiving so her daughter fixed the [place] for the family. She notes that she has been having some anxiety around paying her bills and has gotten a little bit behind on her rent. She notes that she and her oldest daughter split the bills down the middle for the apartment. She notes that things with her anxiety and depression are about the same. She explains that she is thankful that she has a job and a home because a lot of people are struggling. She explains that she is hoping things improve in the new year. She denies suicidal and homicidal thoughts.  Duration of problem: ; Severity of problem: moderate  OBJECTIVE: Mood: Euthymic and Affect: Appropriate Risk of harm to self or others: No plan to harm self or others  LIFE CONTEXT: Family and Social: see above. School/Work: see above. Self-Care: see above. Life Changes: see above.  GOALS ADDRESSED: Patient will: 1.  Reduce symptoms of: anxiety  2.  Increase knowledge and/or ability of: self-management skills and stress reduction  3.  Demonstrate ability to: Increase healthy adjustment to current life circumstances  INTERVENTIONS: Interventions utilized:  Supportive Counseling was utilized by the clinician focusing on the patient's  anxiety. Clinician processed with the patient regarding how she has been doing since the last follow up session. Clinician explained to the patient that she can call the clinic and follow prompts to leave a message for her when she is running low or needs refills on her psychotropic medications. Clinician asked the patient if she had a good Thanksgiving. Clinician explained to the patient that her grandchildren are going to remember her presence at Christmas not whether or not they got a Christmas present from her. Clinician suggested that the patient try to set up a better system of paying the bills with her daughter.  Standardized Assessments completed: GAD-7 and PHQ 9  ASSESSMENT: Patient currently experiencing see above.   Patient may benefit from see above .  PLAN: 1. Follow up with behavioral health clinician on : two weeks or earlier if needed. 2. Behavioral recommendations: see above. 3. Referral(s): [place] (In Clinic) 4. "From scale of 1-10, how likely are you to follow plan?":   Bayard Hugger, LCSW

## 2019-11-23 ENCOUNTER — Other Ambulatory Visit: Payer: Self-pay

## 2019-11-23 ENCOUNTER — Ambulatory Visit
Admission: RE | Admit: 2019-11-23 | Discharge: 2019-11-23 | Disposition: A | Discharge status: Home / Self Care | Payer: Self-pay | Source: Ambulatory Visit | Attending: Gerontology | Admitting: Gerontology

## 2019-11-23 DIAGNOSIS — G8929 Other chronic pain: Secondary | ICD-10-CM

## 2019-11-23 MED ORDER — TRAZODONE HCL 50 MG PO TABS: 50.0000 mg | ORAL_TABLET | Freq: Every evening | ORAL | 2 refills | Status: DC | PRN

## 2019-11-29 ENCOUNTER — Other Ambulatory Visit: Payer: Self-pay

## 2019-11-29 ENCOUNTER — Ambulatory Visit: Payer: Self-pay | Admitting: Specialist

## 2019-11-29 DIAGNOSIS — G8929 Other chronic pain: Secondary | ICD-10-CM

## 2019-11-29 NOTE — Progress Notes (Signed)
Entered in error

## 2019-11-29 NOTE — Progress Notes (Signed)
   Subjective:    Patient ID: Audrey Richard, female    DOB: 11-11-1961, 58 y.o.   MRN: [id number]  HPI She noticed no improvement with the meloxicam she feels a lump to the right of her spine; her pain is constant; and she is unsure whether she would have time off work to go to physical therapy.    Review of Systems     Objective:   Physical Exam I am unable to palpate "lump" on right thoracic area although she feels it. On forward bending I do think she has a degree of scoliosis. Lateral bending today is equal but with pain at the end range.      Assessment & Plan:  To be seen at [facility] for their input.

## 2019-12-06 ENCOUNTER — Ambulatory Visit: Payer: Self-pay | Admitting: Licensed Clinical Social Worker

## 2019-12-06 ENCOUNTER — Telehealth: Payer: Self-pay | Admitting: Licensed Clinical Social Worker

## 2019-12-06 ENCOUNTER — Telehealth: Payer: Self-pay | Admitting: Pharmacist

## 2019-12-06 NOTE — Telephone Encounter (Signed)
Clinician attempted to contact the patient twice during their scheduled phone visit at 4 pm on 12/06/2019. There was no option to leave a voicemail with call back information.

## 2019-12-06 NOTE — Telephone Encounter (Signed)
12/06/2019 11:11:05 AM - Symbicort refill called to [facility]  12/06/2019 Called [facility] - places refill for Symbicort 160/4.5-to ship to our office, patient has 2 refills left, allow 7-10 business days to receive.Audrey Richard

## 2020-01-10 ENCOUNTER — Ambulatory Visit: Payer: Self-pay | Admitting: Gerontology

## 2020-01-10 ENCOUNTER — Encounter: Payer: Self-pay | Admitting: Gerontology

## 2020-01-10 ENCOUNTER — Other Ambulatory Visit: Payer: Self-pay

## 2020-01-10 ENCOUNTER — Ambulatory Visit: Payer: Self-pay | Admitting: Specialist

## 2020-01-10 DIAGNOSIS — E785 Hyperlipidemia, unspecified: Secondary | ICD-10-CM

## 2020-01-10 DIAGNOSIS — E039 Hypothyroidism, unspecified: Secondary | ICD-10-CM

## 2020-01-10 DIAGNOSIS — M549 Dorsalgia, unspecified: Secondary | ICD-10-CM | POA: Insufficient documentation

## 2020-01-10 DIAGNOSIS — K219 Gastro-esophageal reflux disease without esophagitis: Secondary | ICD-10-CM

## 2020-01-10 DIAGNOSIS — G8929 Other chronic pain: Secondary | ICD-10-CM | POA: Insufficient documentation

## 2020-01-10 DIAGNOSIS — M545 Low back pain, unspecified: Secondary | ICD-10-CM

## 2020-01-10 MED ORDER — MELOXICAM 7.5 MG PO TABS: 7.5000 mg | ORAL_TABLET | Freq: Every day | ORAL | 0 refills | Status: DC

## 2020-01-10 MED ORDER — MELOXICAM 15 MG PO TABS: 15.0000 mg | ORAL_TABLET | Freq: Every day | ORAL | 0 refills | Status: DC

## 2020-01-10 MED ORDER — OMEPRAZOLE 40 MG PO CPDR: 40.0000 mg | DELAYED_RELEASE_CAPSULE | Freq: Every evening | ORAL | 10 refills | Status: DC

## 2020-01-10 NOTE — Progress Notes (Signed)
Established Patient Office Visit  Subjective:  Patient ID: Audrey Richard, female    DOB: 10-Nov-1961  Age: 59 y.o. MRN: [id number]  CC: No chief complaint on file. Patient consents to telephone visit and 2 patient identifiers was used to identify patient.  HPI Audrey Richard presents for follow up of hypothyroidism , hyperlipidemia, acid reflux and lower back pain. She states that she's compliant with her medications and continues to make health lifestyle changes. She states that she continues to experience chronic lower back pain that radiates to her legs sometimes. She reports that her pain intensity increases with activity. She denies bladder or bowel incontinence, and saddle anesthesia.  She was seen on 11/29/2019 at the clinic by Dr. Justice Rocher with regards to her back pain, and he referred her to [facility] spine clinic. Currently she states that she does not have a reliable transportation to [facility] and she would like to defer the referral until she is ready.  She denies any chest pain, palpitation, lightheadedness, myalgia, acid reflux, fever and chills. She states that her mood is good, she denies suicidal no homicidal ideation. Overall she states that she is doing well and offers no further complaint.  Past Medical History:  Diagnosis Date  . Asthma   . Depression   . High cholesterol   . Osteoporosis   . Thyroid disease     Past Surgical History:  Procedure Laterality Date  . TUBAL LIGATION      Family History  Problem Relation Age of Onset  . Heart disease Mother   . Heart disease Father   . Diabetes Sister   . Diabetes Maternal Grandmother   . Heart disease Maternal Grandmother   . Diabetes Maternal Grandfather   . Heart disease Maternal Grandfather     Social History   Socioeconomic History  . Marital status: Divorced    Spouse name: Not on file  . Number of children: Not on file  . Years of education: Not on file  . Highest education level: Not on file  Occupational  History  . Not on file  Tobacco Use  . Smoking status: Former Smoker    Quit date: 11/15/2005    Years since quitting: 14.1  . Smokeless tobacco: Never Used  Substance and Sexual Activity  . Alcohol use: Not Currently    Comment: pt states on occasion  . Drug use: No  . Sexual activity: Yes  Other Topics Concern  . Not on file  Social History Narrative   Works at [employer] has 3 children   Social Determinants of Corporate investment banker Strain:   . Difficulty of Paying Living Expenses: Not on file  Food Insecurity:   . Worried About Programme researcher, broadcasting/film/video in the Last Year: Not on file  . Ran Out of Food in the Last Year: Not on file  Transportation Needs:   . Lack of Transportation (Medical): Not on file  . Lack of Transportation (Non-Medical): Not on file  Physical Activity:   . Days of Exercise per Week: Not on file  . Minutes of Exercise per Session: Not on file  Stress:   . Feeling of Stress : Not on file  Social Connections:   . Frequency of Communication with Friends and Family: Not on file  . Frequency of Social Gatherings with Friends and Family: Not on file  . Attends Religious Services: Not on file  . Active Member of Clubs or Organizations: Not on file  .  Attends Archivist Meetings: Not on file  . Marital Status: Not on file  Intimate Partner Violence:   . Fear of Current or Ex-Partner: Not on file  . Emotionally Abused: Not on file  . Physically Abused: Not on file  . Sexually Abused: Not on file    Outpatient Medications Prior to Visit  Medication Sig Dispense Refill  . albuterol (VENTOLIN HFA) 108 (90 Base) MCG/ACT inhaler Inhale 2 puffs into the lungs every 6 (six) hours as needed for wheezing or shortness of breath. 16 g 2  . azelastine (OPTIVAR) 0.05 % ophthalmic solution Place 1 drop into both eyes 2 (two) times daily. (Patient taking differently: Place 1 drop into both eyes 2 (two) times daily. Patient instills 1 drop once daily) 6 mL 2  .  budesonide-formoterol (SYMBICORT) 160-4.5 MCG/ACT inhaler Inhale 2 puffs into the lungs 2 (two) times daily. 1 Inhaler 3  . buPROPion (WELLBUTRIN XL) 150 MG 24 hr tablet Take 1 tablet (150 mg total) by mouth daily. 90 tablet 2  . diphenhydrAMINE (BENADRYL) 25 mg capsule Take 25 mg by mouth every 6 (six) hours as needed.    Marland Kitchen FLUoxetine (PROZAC) 20 MG capsule Take 1 capsule (20 mg total) by mouth daily. 90 capsule 2  . fluticasone (FLONASE) 50 MCG/ACT nasal spray Place 2 sprays into both nostrils daily. 16 g 10  . ipratropium-albuterol (DUONEB) 0.5-2.5 (3) MG/3ML SOLN Take 3 mLs by nebulization every 6 (six) hours as needed. 360 mL 2  . levothyroxine (SYNTHROID) 125 MCG tablet Take 1 tablet (125 mcg total) by mouth daily. 90 tablet 2  . montelukast (SINGULAIR) 10 MG tablet Take 1 tablet (10 mg total) by mouth at bedtime. 30 tablet 5  . rosuvastatin (CRESTOR) 20 MG tablet Take 1 tablet (20 mg total) by mouth daily. 90 tablet 3  . traZODone (DESYREL) 50 MG tablet Take 1 tablet (50 mg total) by mouth at bedtime as needed. 30 tablet 2  . meloxicam (MOBIC) 15 MG tablet Take 1 tablet (15 mg total) by mouth as needed for pain (as needed once a day for pain). 30 tablet 0  . omeprazole (PRILOSEC) 40 MG capsule Take 1 capsule (40 mg total) by mouth every evening. 30 capsule 10   No facility-administered medications prior to visit.    Allergies  Allergen Reactions  . Biaxin [Clarithromycin]     Patient reports itching, nausea  . Iodine     Iodine blisters skin    ROS Review of Systems  Constitutional: Negative.   Respiratory: Negative.   Cardiovascular: Negative.   Endocrine: Negative.   Musculoskeletal: Positive for back pain.  Neurological: Negative.   Psychiatric/Behavioral: Negative.       Objective:    Physical Exam No Physical exam was done LMP  (LMP Unknown)  Wt Readings from Last 3 Encounters:  09/29/19 172 lb (78 kg)  01/04/19 197 lb (89.4 kg)  11/26/18 192 lb (87.1 kg)      Health Maintenance Due  Topic Date Due  . COLONOSCOPY  07/06/2011  . MAMMOGRAM  03/19/2015  . PAP SMEAR-Modifier  05/17/2018  . TETANUS/TDAP  10/20/2019    There are no preventive care reminders to display for this patient.  Lab Results  Component Value Date   TSH 1.750 11/03/2019   Lab Results  Component Value Date   WBC 4.9 08/08/2019   HGB 14.1 08/08/2019   HCT 42.9 08/08/2019   MCV 98 (H) 08/08/2019   PLT 264 08/08/2019  Lab Results  Component Value Date   NA 141 08/08/2019   K 4.1 08/08/2019   CO2 25 08/08/2019   GLUCOSE 78 08/08/2019   BUN 18 08/08/2019   CREATININE 0.83 08/08/2019   BILITOT 0.3 08/08/2019   ALKPHOS 42 08/08/2019   AST 16 08/08/2019   ALT 13 08/08/2019   PROT 6.7 08/08/2019   ALBUMIN 4.8 08/08/2019   CALCIUM 9.4 08/08/2019   ANIONGAP 9 11/17/2018   Lab Results  Component Value Date   CHOL 183 08/08/2019   Lab Results  Component Value Date   HDL 56 08/08/2019   Lab Results  Component Value Date   LDLCALC 107 (H) 08/08/2019   Lab Results  Component Value Date   TRIG 100 08/08/2019   No results found for: CHOLHDL No results found for: LSLH7D    Assessment & Plan:   1. Hypothyroidism, unspecified type -Her TSH done on 11/03/2019 was 1.750, she is euthyroid and will continue on current medication.  2. Gastroesophageal reflux disease, unspecified whether esophagitis present -She states that her acid reflux is under control and she will continue on current medication. -Avoid spicy, fatty and fried food -Avoid sodas and sour juices -Avoid heavy meals -Avoid eating 4 hours before bedtime -Elevate head of bed at night - omeprazole (PRILOSEC) 40 MG capsule; Take 1 capsule (40 mg total) by mouth every evening.  Dispense: 30 capsule; Refill: 10  3. Hyperlipidemia, unspecified hyperlipidemia type -She will continue on her current treatment regimen and advised to continue on -Low fat Diet, like low fat dairy products eg  skimmed milk -Avoid any fried food -Regular exercise/walk -Goal for Total Cholesterol is less than 200 -Goal for bad cholesterol LDL is less than 70 -Goal for Good cholesterol HDL is more than 45 -Goal for Triglyceride is less than 150  4. Chronic low back pain, unspecified back pain laterality, unspecified whether sciatica present -She will continue on meloxicam and was advised to notify the clinic when she is ready to be referred to [facility] clinic.  She was also advised to notify clinic or go to the ED for worsening symptoms. - meloxicam (MOBIC) 15 MG tablet; Take 1 tablet (15 mg total) by mouth daily.  Dispense: 30 tablet; Refill: 0     Follow-up: Return in about 8 weeks (around 03/08/2020), or if symptoms worsen or fail to improve.    Merryl Buckels Trellis Paganini, NP

## 2020-02-19 ENCOUNTER — Telehealth: Payer: Self-pay | Admitting: Pharmacy Technician

## 2020-02-19 NOTE — Telephone Encounter (Signed)
Patient still needs to provide 2020 Federal Tax Return.  Patient acknowledged that she understood that [facility] needs the requested financial documentation by 04/21/20 or medication assistance will halt.  Sherilyn Dacosta Care Manager [facility]

## 2020-02-21 ENCOUNTER — Ambulatory Visit: Payer: Self-pay | Admitting: Specialist

## 2020-02-28 ENCOUNTER — Ambulatory Visit: Payer: Self-pay | Admitting: Specialist

## 2020-03-08 ENCOUNTER — Telehealth: Payer: Self-pay | Admitting: Pharmacy Technician

## 2020-03-08 ENCOUNTER — Other Ambulatory Visit: Payer: Self-pay

## 2020-03-08 ENCOUNTER — Encounter: Payer: Self-pay | Admitting: Gerontology

## 2020-03-08 ENCOUNTER — Ambulatory Visit: Payer: Self-pay | Admitting: Gerontology

## 2020-03-08 ENCOUNTER — Ambulatory Visit: Payer: Self-pay | Admitting: Licensed Clinical Social Worker

## 2020-03-08 VITALS — BP 114/73 | HR 73 | Ht 67.0 in | Wt 170.0 lb

## 2020-03-08 DIAGNOSIS — F331 Major depressive disorder, recurrent, moderate: Secondary | ICD-10-CM

## 2020-03-08 DIAGNOSIS — M545 Low back pain, unspecified: Secondary | ICD-10-CM

## 2020-03-08 DIAGNOSIS — F411 Generalized anxiety disorder: Secondary | ICD-10-CM

## 2020-03-08 DIAGNOSIS — G8929 Other chronic pain: Secondary | ICD-10-CM

## 2020-03-08 MED ORDER — FLUOXETINE HCL 20 MG PO CAPS: 20.0000 mg | ORAL_CAPSULE | Freq: Every day | ORAL | 2 refills | Status: DC

## 2020-03-08 MED ORDER — GABAPENTIN 100 MG PO CAPS: 100.0000 mg | ORAL_CAPSULE | Freq: Two times a day (BID) | ORAL | 0 refills | Status: DC

## 2020-03-08 MED ORDER — BUPROPION HCL ER (XL) 150 MG PO TB24: 150.0000 mg | ORAL_TABLET | Freq: Every day | ORAL | 2 refills | Status: DC

## 2020-03-08 MED ORDER — TRAZODONE HCL 50 MG PO TABS: 50.0000 mg | ORAL_TABLET | Freq: Every evening | ORAL | 2 refills | Status: DC | PRN

## 2020-03-08 MED ORDER — CYCLOBENZAPRINE HCL 10 MG PO TABS: 10.0000 mg | ORAL_TABLET | Freq: Every day | ORAL | 0 refills | Status: DC

## 2020-03-08 NOTE — Progress Notes (Signed)
Established Patient Office Visit  Subjective:  Patient ID: Audrey Richard, female    DOB: 1961/06/08  Age: 59 y.o. MRN: [id number]  CC:  Chief Complaint  Patient presents with  . Back Pain    HPI COPELYN WIDMER presents for follow up of lower back pain. She states that her mid to lower back pain has worsened in the past month. She states that she experiences constant spasm and sharp 10/10 pain, and it also " feels like a knife is sticking into her back"  She states that standing , sitting for more than one hour aggravates symptoms and also when she bends to pick up an object. She also states that lately she's unable to sleep because of her back pain. She reports that taking 15 mg Meloxicam, Tylenol and Ibuprofen minimally relieves her symptoms. She states that pain sometimes radiates to her thighs and lower legs. She reports using back brace while at work with minimal relief. She denies bladder or bowel incontinence, and saddle anesthesia. She also states that she has a history of sciatica and was seen by a Specialist many years ago. Overall, she states that she's doing well, but for her back pain.  Past Medical History:  Diagnosis Date  . Asthma   . Depression   . High cholesterol   . Osteoporosis   . Thyroid disease     Past Surgical History:  Procedure Laterality Date  . TUBAL LIGATION      Family History  Problem Relation Age of Onset  . Heart disease Mother   . Heart disease Father   . Diabetes Sister   . Diabetes Maternal Grandmother   . Heart disease Maternal Grandmother   . Diabetes Maternal Grandfather   . Heart disease Maternal Grandfather     Social History   Socioeconomic History  . Marital status: Divorced    Spouse name: Not on file  . Number of children: Not on file  . Years of education: Not on file  . Highest education level: Not on file  Occupational History  . Not on file  Tobacco Use  . Smoking status: Former Smoker    Quit date: 11/15/2005     Years since quitting: 14.3  . Smokeless tobacco: Never Used  Substance and Sexual Activity  . Alcohol use: Not Currently    Comment: pt states on occasion  . Drug use: No  . Sexual activity: Yes  Other Topics Concern  . Not on file  Social History Narrative   Works at [employer] has 3 children   Social Determinants of Corporate investment banker Strain:   . Difficulty of Paying Living Expenses:   Food Insecurity:   . Worried About Programme researcher, broadcasting/film/video in the Last Year:   . Barista in the Last Year:   Transportation Needs:   . Freight forwarder (Medical):   Marland Kitchen Lack of Transportation (Non-Medical):   Physical Activity:   . Days of Exercise per Week:   . Minutes of Exercise per Session:   Stress:   . Feeling of Stress :   Social Connections:   . Frequency of Communication with Friends and Family:   . Frequency of Social Gatherings with Friends and Family:   . Attends Religious Services:   . Active Member of Clubs or Organizations:   . Attends Banker Meetings:   Marland Kitchen Marital Status:   Intimate Partner Violence:   . Fear of Current or Ex-Partner:   .  Emotionally Abused:   Marland Kitchen Physically Abused:   . Sexually Abused:     Outpatient Medications Prior to Visit  Medication Sig Dispense Refill  . albuterol (VENTOLIN HFA) 108 (90 Base) MCG/ACT inhaler Inhale 2 puffs into the lungs every 6 (six) hours as needed for wheezing or shortness of breath. 16 g 2  . budesonide-formoterol (SYMBICORT) 160-4.5 MCG/ACT inhaler Inhale 2 puffs into the lungs 2 (two) times daily. 1 Inhaler 3  . fluticasone (FLONASE) 50 MCG/ACT nasal spray Place 2 sprays into both nostrils daily. 16 g 10  . levothyroxine (SYNTHROID) 125 MCG tablet Take 1 tablet (125 mcg total) by mouth daily. 90 tablet 2  . montelukast (SINGULAIR) 10 MG tablet Take 1 tablet (10 mg total) by mouth at bedtime. 30 tablet 5  . omeprazole (PRILOSEC) 40 MG capsule Take 1 capsule (40 mg total) by mouth every evening.  30 capsule 10  . rosuvastatin (CRESTOR) 20 MG tablet Take 1 tablet (20 mg total) by mouth daily. 90 tablet 3  . buPROPion (WELLBUTRIN XL) 150 MG 24 hr tablet Take 1 tablet (150 mg total) by mouth daily. 90 tablet 2  . FLUoxetine (PROZAC) 20 MG capsule Take 1 capsule (20 mg total) by mouth daily. 90 capsule 2  . meloxicam (MOBIC) 15 MG tablet Take 1 tablet (15 mg total) by mouth daily. 30 tablet 0  . traZODone (DESYREL) 50 MG tablet Take 1 tablet (50 mg total) by mouth at bedtime as needed. 30 tablet 2  . azelastine (OPTIVAR) 0.05 % ophthalmic solution Place 1 drop into both eyes 2 (two) times daily. (Patient not taking: Reported on 03/08/2020) 6 mL 2  . diphenhydrAMINE (BENADRYL) 25 mg capsule Take 25 mg by mouth every 6 (six) hours as needed.    Marland Kitchen ipratropium-albuterol (DUONEB) 0.5-2.5 (3) MG/3ML SOLN Take 3 mLs by nebulization every 6 (six) hours as needed. (Patient not taking: Reported on 03/08/2020) 360 mL 2   No facility-administered medications prior to visit.    Allergies  Allergen Reactions  . Biaxin [Clarithromycin]     Patient reports itching, nausea  . Iodine     Iodine blisters skin    ROS Review of Systems  Constitutional: Negative.   Respiratory: Negative.   Cardiovascular: Negative.   Genitourinary: Negative.   Musculoskeletal: Positive for back pain (chronic lower back pain).  Neurological: Negative.   Psychiatric/Behavioral: Negative.       Objective:    Physical Exam  Constitutional: She is oriented to person, place, and time. She appears well-developed.  HENT:  Head: Normocephalic and atraumatic.  Cardiovascular: Normal rate and regular rhythm.  Pulmonary/Chest: Effort normal and breath sounds normal.  Musculoskeletal:     Lumbar back: Pain (with forwad flexion) present.  Neurological: She is alert and oriented to person, place, and time.  Psychiatric: She has a normal mood and affect. Her behavior is normal. Judgment and thought content normal.    BP  114/73 (BP Location: Left Arm, Patient Position: Sitting)   Pulse 73   Ht 5\' 7"  (1.702 m)   Wt 170 lb (77.1 kg)   LMP  (LMP Unknown)   SpO2 94%   BMI 26.63 kg/m  Wt Readings from Last 3 Encounters:  03/08/20 170 lb (77.1 kg)  09/29/19 172 lb (78 kg)  01/04/19 197 lb (89.4 kg)     Health Maintenance Due  Topic Date Due  . COLONOSCOPY  Never done  . MAMMOGRAM  03/19/2015  . PAP SMEAR-Modifier  05/17/2018  . TETANUS/TDAP  10/20/2019    There are no preventive care reminders to display for this patient.  Lab Results  Component Value Date   TSH 1.750 11/03/2019   Lab Results  Component Value Date   WBC 4.9 08/08/2019   HGB 14.1 08/08/2019   HCT 42.9 08/08/2019   MCV 98 (H) 08/08/2019   PLT 264 08/08/2019   Lab Results  Component Value Date   NA 141 08/08/2019   K 4.1 08/08/2019   CO2 25 08/08/2019   GLUCOSE 78 08/08/2019   BUN 18 08/08/2019   CREATININE 0.83 08/08/2019   BILITOT 0.3 08/08/2019   ALKPHOS 42 08/08/2019   AST 16 08/08/2019   ALT 13 08/08/2019   PROT 6.7 08/08/2019   ALBUMIN 4.8 08/08/2019   CALCIUM 9.4 08/08/2019   ANIONGAP 9 11/17/2018   Lab Results  Component Value Date   CHOL 183 08/08/2019   Lab Results  Component Value Date   HDL 56 08/08/2019   Lab Results  Component Value Date   LDLCALC 107 (H) 08/08/2019   Lab Results  Component Value Date   TRIG 100 08/08/2019   No results found for: CHOLHDL No results found for: HGBA1C    Assessment & Plan:    1. Chronic midline low back pain, unspecified whether sciatica present - She will start cyclobenzaprine 10 mg for spasm and gabapentin for possible neuropathic pain. She was educated about medication side effects and was advised to notify clinic. - cyclobenzaprine (FLEXERIL) 10 MG tablet; Take 1 tablet (10 mg total) by mouth at bedtime.  Dispense: 30 tablet; Refill: 0 - gabapentin (NEURONTIN) 100 MG capsule; Take 1 capsule (100 mg total) by mouth 2 (two) times daily.  Dispense:  42 capsule; Refill: 0 -  She was advised to complete charity care application for Ambulatory referral to Orthopedic Surgery at [facility]. She was advised to go to the ED for worsening symptoms.    Follow-up: Return in about 3 weeks (around 03/29/2020).    Keyari Kleeman Jerold Coombe, NP

## 2020-03-08 NOTE — Telephone Encounter (Signed)
Received updated proof of income.  Patient eligible to receive medication assistance at [facility] until time for re-certification in 9359, and as long as eligibility requirements continue to be met.  [place] [facility]

## 2020-03-08 NOTE — BH Specialist Note (Signed)
Integrated Behavioral Health Follow Up Visit  MRN: [id number] Name: Audrey Richard  Number of Integrated Behavioral Health Clinician visits: 3/6  Type of Service: Integrated Behavioral Health- Individual/Family Interpretor:No. Interpretor Name and Language: not applicable.   SUBJECTIVE: Audrey Richard is a 59 y.o. female accompanied by herself. Patient was referred by Hurman Horn NP for mental health.  Patient reports the following symptoms/concerns: She notes that she is still working at [employer]. She notes that she has back problems and is trying to get taken care of. She notes that her youngest daughter still lives with her and is a big part of her support system. She notes that her lost her father to COVID 55 in the last two months and a cousin. She explains that her dad was in a nursing home and caught it there. She notes that she was able to talk to her dad on the phone before he passed away. She denies suicidal and homicidal thoughts.  Duration of problem: ; Severity of problem: moderate  OBJECTIVE: Mood: Euthymic and Affect: Appropriate Risk of harm to self or others: No plan to harm self or others  LIFE CONTEXT: Family and Social: See above. School/Work: see above. Self-Care: see above. Life Changes: see above.   GOALS ADDRESSED: Patient will: 1.  Reduce symptoms of: depression  2.  Increase knowledge and/or ability of: self-management skills  3.  Demonstrate ability to: Increase healthy adjustment to current life circumstances  INTERVENTIONS: Interventions utilized:  Supportive Counseling was utilized by the clinician during today's follow up session. Clinician processed with the patient regarding how she has been doing since the last follow up session. Clinician expressed her condolences for the loss of her father to COVID 56. Clinician provided psycho education about the stages of grief. Clinician measured the patient's anxiety and depression on a numerical scale.  Clinician encouraged the patient to continue to lean on her support system, practice self care, and take her psychotropic medications as prescribed.  Standardized Assessments completed: GAD-7 and PHQ 9  ASSESSMENT: Patient currently experiencing see above.   Patient may benefit from see above.  PLAN: 1. Follow up with behavioral health clinician on : one month or earlier if needed. 2. Behavioral recommendations: see above. 3. Referral(s): Integrated [employer] (In Clinic) 4. "From scale of 1-10, how likely are you to follow plan?":   Althia Forts, LCSW

## 2020-03-12 ENCOUNTER — Other Ambulatory Visit: Payer: Self-pay | Admitting: Nurse Practitioner

## 2020-03-22 ENCOUNTER — Encounter: Payer: Self-pay | Admitting: Gerontology

## 2020-03-22 ENCOUNTER — Other Ambulatory Visit: Payer: Self-pay

## 2020-03-22 ENCOUNTER — Ambulatory Visit: Payer: Self-pay | Admitting: Gerontology

## 2020-03-22 VITALS — BP 104/69 | HR 89 | Temp 97.8°F | Wt 174.0 lb

## 2020-03-22 DIAGNOSIS — M545 Low back pain, unspecified: Secondary | ICD-10-CM

## 2020-03-22 DIAGNOSIS — G8929 Other chronic pain: Secondary | ICD-10-CM

## 2020-03-22 MED ORDER — GABAPENTIN 100 MG PO CAPS: 100.0000 mg | ORAL_CAPSULE | Freq: Two times a day (BID) | ORAL | 0 refills | Status: DC

## 2020-03-22 MED ORDER — CYCLOBENZAPRINE HCL 10 MG PO TABS: 10.0000 mg | ORAL_TABLET | Freq: Every day | ORAL | 0 refills | Status: DC

## 2020-03-22 NOTE — Progress Notes (Signed)
Established Patient Office Visit  Subjective:  Patient ID: Audrey Richard, female    DOB: 1961/07/14  Age: 59 y.o. MRN: [id number]  CC: No chief complaint on file.   HPI Audrey Richard presents for follow up of her chronic back pain. She states that she's compliant with her medications and taking 10 mg Cyclobenzaprine and gabapentin moderately relieves her symptoms and she's able to sleep at night. She denies bowel or bladder incontinence and saddle anesthesia. She reports that she has an appointment on 04/26/2020 with Dr Forest Gleason at [facility]. Overall, she reports that she's doing well and offers no further complaint.  Past Medical History:  Diagnosis Date  . Asthma   . Depression   . High cholesterol   . Osteoporosis   . Thyroid disease     Past Surgical History:  Procedure Laterality Date  . TUBAL LIGATION      Family History  Problem Relation Age of Onset  . Heart disease Mother   . Heart disease Father   . Diabetes Sister   . Diabetes Maternal Grandmother   . Heart disease Maternal Grandmother   . Diabetes Maternal Grandfather   . Heart disease Maternal Grandfather     Social History   Socioeconomic History  . Marital status: Divorced    Spouse name: Not on file  . Number of children: Not on file  . Years of education: Not on file  . Highest education level: Not on file  Occupational History  . Not on file  Tobacco Use  . Smoking status: Former Smoker    Quit date: 11/15/2005    Years since quitting: 14.3  . Smokeless tobacco: Never Used  Substance and Sexual Activity  . Alcohol use: Not Currently    Comment: pt states on occasion  . Drug use: No  . Sexual activity: Yes  Other Topics Concern  . Not on file  Social History Narrative   Works at [employer] has 3 children   Social Determinants of Corporate investment banker Strain:   . Difficulty of Paying Living Expenses:   Food Insecurity:   . Worried About Programme researcher, broadcasting/film/video in the  Last Year:   . Barista in the Last Year:   Transportation Needs:   . Freight forwarder (Medical):   Marland Kitchen Lack of Transportation (Non-Medical):   Physical Activity:   . Days of Exercise per Week:   . Minutes of Exercise per Session:   Stress:   . Feeling of Stress :   Social Connections:   . Frequency of Communication with Friends and Family:   . Frequency of Social Gatherings with Friends and Family:   . Attends Religious Services:   . Active Member of Clubs or Organizations:   . Attends Banker Meetings:   Marland Kitchen Marital Status:   Intimate Partner Violence:   . Fear of Current or Ex-Partner:   . Emotionally Abused:   Marland Kitchen Physically Abused:   . Sexually Abused:     Outpatient Medications Prior to Visit  Medication Sig Dispense Refill  . albuterol (VENTOLIN HFA) 108 (90 Base) MCG/ACT inhaler Inhale 2 puffs into the lungs every 6 (six) hours as needed for wheezing or shortness of breath. 16 g 2  . azelastine (OPTIVAR) 0.05 % ophthalmic solution Place 1 drop into both eyes 2 (two) times daily. (Patient not taking: Reported on 03/08/2020) 6 mL 2  . budesonide-formoterol (SYMBICORT) 160-4.5 MCG/ACT inhaler Inhale  2 puffs into the lungs 2 (two) times daily. 1 Inhaler 3  . buPROPion (WELLBUTRIN XL) 150 MG 24 hr tablet Take 1 tablet (150 mg total) by mouth daily. 30 tablet 2  . diphenhydrAMINE (BENADRYL) 25 mg capsule Take 25 mg by mouth every 6 (six) hours as needed.    Marland Kitchen FLUoxetine (PROZAC) 20 MG capsule Take 1 capsule (20 mg total) by mouth daily. 30 capsule 2  . fluticasone (FLONASE) 50 MCG/ACT nasal spray Place 2 sprays into both nostrils daily. 16 g 10  . ipratropium-albuterol (DUONEB) 0.5-2.5 (3) MG/3ML SOLN Take 3 mLs by nebulization every 6 (six) hours as needed. (Patient not taking: Reported on 03/08/2020) 360 mL 2  . levothyroxine (SYNTHROID) 125 MCG tablet Take 1 tablet (125 mcg total) by mouth daily. 90 tablet 2  . montelukast (SINGULAIR) 10 MG tablet Take 1  tablet (10 mg total) by mouth at bedtime. 30 tablet 5  . omeprazole (PRILOSEC) 40 MG capsule Take 1 capsule (40 mg total) by mouth every evening. 30 capsule 10  . rosuvastatin (CRESTOR) 20 MG tablet Take 1 tablet (20 mg total) by mouth daily. 90 tablet 3  . traZODone (DESYREL) 50 MG tablet Take 1 tablet (50 mg total) by mouth at bedtime as needed. 30 tablet 2  . cyclobenzaprine (FLEXERIL) 10 MG tablet Take 1 tablet (10 mg total) by mouth at bedtime. 30 tablet 0  . gabapentin (NEURONTIN) 100 MG capsule Take 1 capsule (100 mg total) by mouth 2 (two) times daily. 42 capsule 0   No facility-administered medications prior to visit.    Allergies  Allergen Reactions  . Biaxin [Clarithromycin]     Patient reports itching, nausea  . Iodine     Iodine blisters skin    ROS Review of Systems  Respiratory: Negative.   Cardiovascular: Negative.   Musculoskeletal: Positive for back pain.  Neurological: Negative.       Objective:    Physical Exam  Constitutional: She is oriented to person, place, and time. She appears well-developed.  HENT:  Head: Normocephalic and atraumatic.  Cardiovascular: Normal rate and regular rhythm.  Pulmonary/Chest: Effort normal.  Musculoskeletal:     Lumbar back: Tenderness (Forward flexion) present.  Neurological: She is alert and oriented to person, place, and time. She has normal reflexes.  Psychiatric: She has a normal mood and affect. Her behavior is normal. Thought content normal.    BP 104/69 (BP Location: Left Arm, Patient Position: Sitting, Cuff Size: Large)   Pulse 89   Temp 97.8 F (36.6 C)   Wt 174 lb (78.9 kg)   LMP  (LMP Unknown)   BMI 27.25 kg/m  Wt Readings from Last 3 Encounters:  03/22/20 174 lb (78.9 kg)  03/08/20 170 lb (77.1 kg)  09/29/19 172 lb (78 kg)     Health Maintenance Due  Topic Date Due  . COLONOSCOPY  Never done  . MAMMOGRAM  03/19/2015  . PAP SMEAR-Modifier  05/17/2018  . TETANUS/TDAP  10/20/2019    There are  no preventive care reminders to display for this patient.  Lab Results  Component Value Date   TSH 1.750 11/03/2019   Lab Results  Component Value Date   WBC 4.9 08/08/2019   HGB 14.1 08/08/2019   HCT 42.9 08/08/2019   MCV 98 (H) 08/08/2019   PLT 264 08/08/2019   Lab Results  Component Value Date   NA 141 08/08/2019   K 4.1 08/08/2019   CO2 25 08/08/2019   GLUCOSE 78  08/08/2019   BUN 18 08/08/2019   CREATININE 0.83 08/08/2019   BILITOT 0.3 08/08/2019   ALKPHOS 42 08/08/2019   AST 16 08/08/2019   ALT 13 08/08/2019   PROT 6.7 08/08/2019   ALBUMIN 4.8 08/08/2019   CALCIUM 9.4 08/08/2019   ANIONGAP 9 11/17/2018   Lab Results  Component Value Date   CHOL 183 08/08/2019   Lab Results  Component Value Date   HDL 56 08/08/2019   Lab Results  Component Value Date   LDLCALC 107 (H) 08/08/2019   Lab Results  Component Value Date   TRIG 100 08/08/2019   No results found for: CHOLHDL No results found for: AYTK1S    Assessment & Plan:     1. Chronic midline low back pain, unspecified whether sciatica present - She will continue on current medication and was advised to go to the ED with worsening symptoms. She was also advised to follow up with her appointment at [facility] on 04/26/2020. - cyclobenzaprine (FLEXERIL) 10 MG tablet; Take 1 tablet (10 mg total) by mouth at bedtime.  Dispense: 30 tablet; Refill: 0 - gabapentin (NEURONTIN) 100 MG capsule; Take 1 capsule (100 mg total) by mouth 2 (two) times daily.  Dispense: 42 capsule; Refill: 0   Follow-up: Return in about 6 weeks (around 05/03/2020), or if symptoms worsen or fail to improve.    Alcie Runions Trellis Paganini, NP

## 2020-03-22 NOTE — Patient Instructions (Signed)

## 2020-04-05 ENCOUNTER — Ambulatory Visit: Payer: Self-pay | Admitting: Licensed Clinical Social Worker

## 2020-04-12 ENCOUNTER — Ambulatory Visit: Payer: Self-pay | Admitting: Licensed Clinical Social Worker

## 2020-04-17 ENCOUNTER — Ambulatory Visit: Payer: Self-pay | Admitting: Specialist

## 2020-04-19 ENCOUNTER — Telehealth: Payer: Self-pay | Admitting: Pharmacist

## 2020-04-19 NOTE — Telephone Encounter (Signed)
04/19/2020 1:51:57 PM - Refilled Ventolin with [facility] online  -- Rhetta Mura - Thursday, April 19, 2020 1:51 PM --Refilled Ventolin online with [facility], to ship 05/02/2020, order# [id number].

## 2020-04-23 ENCOUNTER — Other Ambulatory Visit: Payer: Self-pay

## 2020-04-26 ENCOUNTER — Ambulatory Visit: Payer: Medicaid Other | Admitting: Licensed Clinical Social Worker

## 2020-04-26 ENCOUNTER — Other Ambulatory Visit: Payer: Self-pay

## 2020-04-26 ENCOUNTER — Encounter: Payer: Self-pay | Admitting: Licensed Clinical Social Worker

## 2020-04-26 DIAGNOSIS — F331 Major depressive disorder, recurrent, moderate: Secondary | ICD-10-CM

## 2020-04-26 DIAGNOSIS — F411 Generalized anxiety disorder: Secondary | ICD-10-CM

## 2020-04-26 NOTE — BH Specialist Note (Signed)
Integrated Behavioral Health Follow Up Visit Via Phone  MRN: [id number] Name: Audrey Richard  Number of Integrated Behavioral Health Clinician visits: 4/6  Type of Service: Integrated Behavioral Health- Individual/Family Interpretor:No. Interpretor Name and Language: not applicable.   SUBJECTIVE: Audrey Richard is a 59 y.o. female accompanied by herself. Patient was referred by Hurman Horn NP for mental health. Patient reports the following symptoms/concerns: She notes that she has not been feeling so well due to allergies and drainage. She notes that she is taking Allegra D and it seems to be helping so far.  She notes that she applied for Medicaid and Medicare, it says she was approved. She asked what the family planning covered and if she could use it. She notes that she got upset because she did not get the stimulus for the government and was able to get her income taxes back. She explains that she got a sheet with multiple reasons why she could not have it. She notes that she is thankful to have a job but wishes she could have the extra income and is thinking of asking for more hours at work. She denies suicidal and homicidal thoughts.  Duration of problem: ; Severity of problem: moderate  OBJECTIVE: Mood: Euthymic and Affect: Appropriate Risk of harm to self or others: No plan to harm self or others  LIFE CONTEXT: Family and Social: see above. School/Work: see above. Self-Care: see above. Life Changes: see above.  GOALS ADDRESSED: Patient will: 1.  Reduce symptoms of: stress  2.  Increase knowledge and/or ability of: self-management skills and stress reduction  3.  Demonstrate ability to: Increase healthy adjustment to current life circumstances  INTERVENTIONS: Interventions utilized:  Supportive Counseling was utilized by the clinician during today's session. Clinician processed with the patient regarding how she has been doing since the last follow up session.  Clinician measured the patient's anxiety and depression on a numerical scale. Clinician provided psycho education to the patient that family planning medicaid is only for pregnant women and women that are parenting young kids. Clinician explained to the patient that family planning medicaid is not the same as regular medicaid therefore she cannot get the same benefits and does not have health insurance. Clinician provided the patient with direct contact information to reach out to IRS regarding her stimulus payment.  Standardized Assessments completed: GAD-7 and PHQ 9  ASSESSMENT: Patient currently experiencing see above.  Patient may benefit from see above.  PLAN: 1. Follow up with behavioral health clinician on :  2. Behavioral recommendations: see above. 3. Referral(s): Integrated [facility] (In Clinic) 4. "From scale of 1-10, how likely are you to follow plan?":   Althia Forts, LCSW

## 2020-05-03 ENCOUNTER — Ambulatory Visit: Payer: Self-pay | Admitting: Gerontology

## 2020-05-10 ENCOUNTER — Encounter: Payer: Self-pay | Admitting: Gerontology

## 2020-05-10 ENCOUNTER — Ambulatory Visit: Payer: Medicaid Other | Admitting: Gerontology

## 2020-05-10 ENCOUNTER — Other Ambulatory Visit: Payer: Self-pay

## 2020-05-10 VITALS — BP 115/75 | HR 75 | Temp 97.3°F | Resp 16 | Wt 170.5 lb

## 2020-05-10 DIAGNOSIS — J309 Allergic rhinitis, unspecified: Secondary | ICD-10-CM

## 2020-05-10 DIAGNOSIS — M545 Low back pain, unspecified: Secondary | ICD-10-CM

## 2020-05-10 MED ORDER — CETIRIZINE HCL 10 MG PO TABS: 10.0000 mg | ORAL_TABLET | Freq: Every day | ORAL | 2 refills | Status: DC

## 2020-05-10 MED ORDER — CYCLOBENZAPRINE HCL 10 MG PO TABS: 10.0000 mg | ORAL_TABLET | Freq: Every day | ORAL | 0 refills | Status: DC

## 2020-05-10 MED ORDER — GABAPENTIN 100 MG PO CAPS: 100.0000 mg | ORAL_CAPSULE | Freq: Two times a day (BID) | ORAL | 0 refills | Status: DC

## 2020-05-10 NOTE — Progress Notes (Signed)
Established Patient Office Visit  Subjective:  Patient ID: Audrey Richard, female    DOB: 1960/12/28  Age: 59 y.o. MRN: [id number]  CC: No chief complaint on file.   HPI Audrey Richard presents for  follow up of her chronic back pain and medication refill. She states that she's compliant with her medications and taking 10 mg Cyclobenzaprine and gabapentin moderately relieves her symptoms and she's able to sleep at night. She denies bowel or bladder incontinence and saddle anesthesia. She reports that she will follow up with [facility] Orthopedic Surgeon Dr Armanda Magic on 06/21/2020. Currently, she states that she has been experiencing post nasal drip, watery eyes, sneezing,sinus pressure due to seasonal allergy. She denies headache, cough, sore throat, fever and chills. Overall, she states that she's doing well and offers no further complaint.  Past Medical History:  Diagnosis Date  . Asthma   . Depression   . High cholesterol   . Osteoporosis   . Thyroid disease     Past Surgical History:  Procedure Laterality Date  . TUBAL LIGATION      Family History  Problem Relation Age of Onset  . Heart disease Mother   . Heart disease Father   . Diabetes Sister   . Diabetes Maternal Grandmother   . Heart disease Maternal Grandmother   . Diabetes Maternal Grandfather   . Heart disease Maternal Grandfather     Social History   Socioeconomic History  . Marital status: Divorced    Spouse name: Not on file  . Number of children: 3  . Years of education: Not on file  . Highest education level: Not on file  Occupational History  . Not on file  Tobacco Use  . Smoking status: Former Smoker    Quit date: 11/15/2005    Years since quitting: 14.4  . Smokeless tobacco: Never Used  Substance and Sexual Activity  . Alcohol use: Not Currently    Comment: pt states on occasion  . Drug use: No  . Sexual activity: Yes  Other Topics Concern  . Not on file  Social History Narrative   Social  determinants completed. HS   Works at [employer] has 3 children.   Social Determinants of Health   Financial Resource Strain: Medium Risk  . Difficulty of Paying Living Expenses: Somewhat hard  Food Insecurity: No Food Insecurity  . Worried About Charity fundraiser in the Last Year: Never true  . Ran Out of Food in the Last Year: Never true  Transportation Needs: No Transportation Needs  . Lack of Transportation (Medical): No  . Lack of Transportation (Non-Medical): No  Physical Activity: Inactive  . Days of Exercise per Week: 0 days  . Minutes of Exercise per Session: 0 min  Stress: Stress Concern Present  . Feeling of Stress : To some extent  Social Connections: Moderately Isolated  . Frequency of Communication with Friends and Family: Once a week  . Frequency of Social Gatherings with Friends and Family: Twice a week  . Attends Religious Services: Never  . Active Member of Clubs or Organizations: No  . Attends Archivist Meetings: Never  . Marital Status: Divorced  Human resources officer Violence: Not At Risk  . Fear of Current or Ex-Partner: No  . Emotionally Abused: No  . Physically Abused: No  . Sexually Abused: No    Outpatient Medications Prior to Visit  Medication Sig Dispense Refill  . albuterol (VENTOLIN HFA) 108 (90 Base) MCG/ACT inhaler Inhale  2 puffs into the lungs every 6 (six) hours as needed for wheezing or shortness of breath. 16 g 2  . budesonide-formoterol (SYMBICORT) 160-4.5 MCG/ACT inhaler Inhale 2 puffs into the lungs 2 (two) times daily. 1 Inhaler 3  . fluticasone (FLONASE) 50 MCG/ACT nasal spray Place 2 sprays into both nostrils daily. 16 g 10  . ipratropium-albuterol (DUONEB) 0.5-2.5 (3) MG/3ML SOLN Take 3 mLs by nebulization every 6 (six) hours as needed. 360 mL 2  . levothyroxine (SYNTHROID) 125 MCG tablet Take 1 tablet (125 mcg total) by mouth daily. 90 tablet 2  . montelukast (SINGULAIR) 10 MG tablet Take 1 tablet (10 mg total) by mouth at  bedtime. 30 tablet 5  . omeprazole (PRILOSEC) 40 MG capsule Take 1 capsule (40 mg total) by mouth every evening. 30 capsule 10  . rosuvastatin (CRESTOR) 20 MG tablet Take 1 tablet (20 mg total) by mouth daily. 90 tablet 3  . traZODone (DESYREL) 50 MG tablet Take 1 tablet (50 mg total) by mouth at bedtime as needed. 30 tablet 2  . cyclobenzaprine (FLEXERIL) 10 MG tablet Take 1 tablet (10 mg total) by mouth at bedtime. 30 tablet 0  . gabapentin (NEURONTIN) 100 MG capsule Take 1 capsule (100 mg total) by mouth 2 (two) times daily. 42 capsule 0  . azelastine (OPTIVAR) 0.05 % ophthalmic solution Place 1 drop into both eyes 2 (two) times daily. (Patient not taking: Reported on 03/08/2020) 6 mL 2  . buPROPion (WELLBUTRIN XL) 150 MG 24 hr tablet Take 1 tablet (150 mg total) by mouth daily. 30 tablet 2  . diphenhydrAMINE (BENADRYL) 25 mg capsule Take 25 mg by mouth every 6 (six) hours as needed.    Marland Kitchen FLUoxetine (PROZAC) 20 MG capsule Take 1 capsule (20 mg total) by mouth daily. 30 capsule 2   No facility-administered medications prior to visit.    Allergies  Allergen Reactions  . Biaxin [Clarithromycin]     Patient reports itching, nausea  . Iodine     Iodine blisters skin    ROS Review of Systems  Constitutional: Negative.   HENT: Positive for postnasal drip and sinus pressure.   Respiratory: Negative.   Cardiovascular: Negative.   Musculoskeletal: Positive for back pain.  Allergic/Immunologic: Positive for environmental allergies.  Neurological: Negative.   Psychiatric/Behavioral: Negative.       Objective:    Physical Exam  Constitutional: She is oriented to person, place, and time. She appears well-developed.  HENT:  Head: Atraumatic.  Nose: Right sinus exhibits no maxillary sinus tenderness and no frontal sinus tenderness. Left sinus exhibits no maxillary sinus tenderness and no frontal sinus tenderness.  Eyes: Pupils are equal, round, and reactive to light. EOM are normal.   Cardiovascular: Normal rate and regular rhythm.  Pulmonary/Chest: Effort normal and breath sounds normal.  Neurological: She is alert and oriented to person, place, and time.  Psychiatric: She has a normal mood and affect. Her behavior is normal. Judgment and thought content normal.    BP 115/75 (BP Location: Left Arm, Patient Position: Sitting, Cuff Size: Large)   Pulse 75   Temp (!) 97.3 F (36.3 C)   Resp 16   Wt 170 lb 8 oz (77.3 kg)   LMP  (LMP Unknown)   SpO2 98%   BMI 26.70 kg/m  Wt Readings from Last 3 Encounters:  05/10/20 170 lb 8 oz (77.3 kg)  03/22/20 174 lb (78.9 kg)  03/08/20 170 lb (77.1 kg)     Health Maintenance Due  Topic Date Due  . COVID-19 Vaccine (1) Never done  . COLONOSCOPY  Never done  . MAMMOGRAM  03/19/2015  . PAP SMEAR-Modifier  05/17/2018  . TETANUS/TDAP  10/20/2019    There are no preventive care reminders to display for this patient.  Lab Results  Component Value Date   TSH 1.750 11/03/2019   Lab Results  Component Value Date   WBC 4.9 08/08/2019   HGB 14.1 08/08/2019   HCT 42.9 08/08/2019   MCV 98 (H) 08/08/2019   PLT 264 08/08/2019   Lab Results  Component Value Date   NA 141 08/08/2019   K 4.1 08/08/2019   CO2 25 08/08/2019   GLUCOSE 78 08/08/2019   BUN 18 08/08/2019   CREATININE 0.83 08/08/2019   BILITOT 0.3 08/08/2019   ALKPHOS 42 08/08/2019   AST 16 08/08/2019   ALT 13 08/08/2019   PROT 6.7 08/08/2019   ALBUMIN 4.8 08/08/2019   CALCIUM 9.4 08/08/2019   ANIONGAP 9 11/17/2018   Lab Results  Component Value Date   CHOL 183 08/08/2019   Lab Results  Component Value Date   HDL 56 08/08/2019   Lab Results  Component Value Date   LDLCALC 107 (H) 08/08/2019   Lab Results  Component Value Date   TRIG 100 08/08/2019   No results found for: CHOLHDL No results found for: NIDP8E    Assessment & Plan:    1. Chronic midline low back pain, unspecified whether sciatica present - She will continue on current  medication and was advised to follow up with [facility] Orthopedic Surgeon on 06/21/2020. - cyclobenzaprine (FLEXERIL) 10 MG tablet; Take 1 tablet (10 mg total) by mouth at bedtime.  Dispense: 30 tablet; Refill: 0 - gabapentin (NEURONTIN) 100 MG capsule; Take 1 capsule (100 mg total) by mouth 2 (two) times daily.  Dispense: 42 capsule; Refill: 0  2. Allergic rhinitis, unspecified seasonality, unspecified trigger - She will continue on Cetirizine, educated on medication side effects and advised to notify clinic. - cetirizine (ZYRTEC ALLERGY) 10 MG tablet; Take 1 tablet (10 mg total) by mouth daily.  Dispense: 30 tablet; Refill: 2    Follow-up: Return in about 7 weeks (around 06/28/2020), or if symptoms worsen or fail to improve.    Zakye Baby Trellis Paganini, NP

## 2020-06-07 ENCOUNTER — Other Ambulatory Visit: Payer: Self-pay

## 2020-06-07 ENCOUNTER — Ambulatory Visit: Payer: Medicaid Other | Admitting: Licensed Clinical Social Worker

## 2020-06-07 DIAGNOSIS — F411 Generalized anxiety disorder: Secondary | ICD-10-CM

## 2020-06-07 DIAGNOSIS — F331 Major depressive disorder, recurrent, moderate: Secondary | ICD-10-CM

## 2020-06-07 MED ORDER — BUPROPION HCL ER (XL) 150 MG PO TB24: 150.0000 mg | ORAL_TABLET | Freq: Every day | ORAL | 2 refills | Status: DC

## 2020-06-07 MED ORDER — TRAZODONE HCL 50 MG PO TABS: 50.0000 mg | ORAL_TABLET | Freq: Every evening | ORAL | 2 refills | Status: DC | PRN

## 2020-06-07 MED ORDER — FLUOXETINE HCL 20 MG PO CAPS: 20.0000 mg | ORAL_CAPSULE | Freq: Every day | ORAL | 2 refills | Status: DC
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
## 2020-06-28 ENCOUNTER — Ambulatory Visit: Payer: Medicaid Other | Admitting: Gerontology

## 2020-07-12 ENCOUNTER — Telehealth: Payer: Self-pay | Admitting: Pharmacist

## 2020-07-12 ENCOUNTER — Other Ambulatory Visit: Payer: Self-pay

## 2020-07-12 ENCOUNTER — Ambulatory Visit: Payer: Medicaid Other | Admitting: Licensed Clinical Social Worker

## 2020-07-12 DIAGNOSIS — F331 Major depressive disorder, recurrent, moderate: Secondary | ICD-10-CM

## 2020-07-12 DIAGNOSIS — F411 Generalized anxiety disorder: Secondary | ICD-10-CM
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
## 2020-07-12 NOTE — Telephone Encounter (Signed)
07/12/2020 2:51:06 PM - Ventolin script to provder to sign for refill  -- Rhetta Mura - Thursday, July 12, 2020 2:49 PM --Ventolin time for refill-[facility] needs script to refill. Printed Ventolin script and mailing to provider to sign & return. Also patient enrollment with [facility] ends 09/24/2020.

## 2020-07-13 ENCOUNTER — Other Ambulatory Visit: Payer: Self-pay | Admitting: Gerontology

## 2020-07-13 ENCOUNTER — Other Ambulatory Visit: Payer: Self-pay | Admitting: Nurse Practitioner

## 2020-07-13 DIAGNOSIS — J309 Allergic rhinitis, unspecified: Secondary | ICD-10-CM

## 2020-07-26 ENCOUNTER — Ambulatory Visit: Payer: Medicaid Other | Admitting: Gerontology

## 2020-07-26 ENCOUNTER — Other Ambulatory Visit: Payer: Self-pay | Admitting: Gerontology

## 2020-07-26 VITALS — BP 120/74 | Temp 97.3°F | Wt 184.0 lb

## 2020-07-26 DIAGNOSIS — K029 Dental caries, unspecified: Secondary | ICD-10-CM | POA: Insufficient documentation

## 2020-07-26 DIAGNOSIS — Z0000 Encounter for general adult medical examination without abnormal findings: Secondary | ICD-10-CM

## 2020-07-26 DIAGNOSIS — E039 Hypothyroidism, unspecified: Secondary | ICD-10-CM

## 2020-07-26 DIAGNOSIS — M545 Low back pain, unspecified: Secondary | ICD-10-CM

## 2020-07-26 MED ORDER — GABAPENTIN 300 MG PO CAPS: 300.0000 mg | ORAL_CAPSULE | Freq: Three times a day (TID) | ORAL | 3 refills | Status: DC

## 2020-07-26 MED ORDER — LEVOTHYROXINE SODIUM 125 MCG PO TABS: 125.0000 ug | ORAL_TABLET | Freq: Every day | ORAL | 2 refills | Status: DC

## 2020-07-26 NOTE — Progress Notes (Signed)
Established Patient Office Visit  Subjective:  Patient ID: Audrey Richard, female    DOB: [birth year]  Age: 59 y.o. MRN: [id number]  CC: No chief complaint on file.   HPI [name] presents for follow up of lower back pain and medication refill. She states that she's compliant with her medications and continues to make healthy lifestyle changes. She was seen at [facility] on 06/21/2020 by Dr Thereasa Parkin ,Laurine Blazer, she was referred to Physical Therapy, her Gabapentin was increased to 300 mg tid. Currently, she states that taking Gabapentin and Tylenol 1000 mg bid as needed moderately relieves her symptoms. She denies bladder, nor urinary incontinence and saddle anesthesia. She also c/o left lower mandibular tooth cavity and requests dental referral. She defers Colonoscopy and agrees to go for Mammogram and Pap smear. Overall, she states that she's doing well and offers no further complaint.  Past Medical History:  Diagnosis Date  . Asthma   . Depression   . High cholesterol   . Osteoporosis   . Thyroid disease     Past Surgical History:  Procedure Laterality Date  . TUBAL LIGATION      Family History  Problem Relation Age of Onset  . Heart disease Mother   . Heart disease Father   . Diabetes Sister   . Diabetes Maternal Grandmother   . Heart disease Maternal Grandmother   . Diabetes Maternal Grandfather   . Heart disease Maternal Grandfather     Social History   Socioeconomic History  . Marital status: Divorced    Spouse name: Not on file  . Number of children: 3  . Years of education: Not on file  . Highest education level: Not on file  Occupational History  . Not on file  Tobacco Use  . Smoking status: Former Smoker    Quit date: 11/15/2005    Years since quitting: 14.7  . Smokeless tobacco: Never Used  Vaping Use  . Vaping Use: Never used  Substance and Sexual Activity  . Alcohol use: Not Currently    Comment: pt states on occasion  . Drug use: No  .  Sexual activity: Yes  Other Topics Concern  . Not on file  Social History Narrative   Social determinants completed. HS   Works at [employer] has 3 children.   Social Determinants of Health   Financial Resource Strain: Medium Risk  . Difficulty of Paying Living Expenses: Somewhat hard  Food Insecurity: No Food Insecurity  . Worried About Charity fundraiser in the Last Year: Never true  . Ran Out of Food in the Last Year: Never true  Transportation Needs: No Transportation Needs  . Lack of Transportation (Medical): No  . Lack of Transportation (Non-Medical): No  Physical Activity: Inactive  . Days of Exercise per Week: 0 days  . Minutes of Exercise per Session: 0 min  Stress: Stress Concern Present  . Feeling of Stress : To some extent  Social Connections: Socially Isolated  . Frequency of Communication with Friends and Family: Once a week  . Frequency of Social Gatherings with Friends and Family: Twice a week  . Attends Religious Services: Never  . Active Member of Clubs or Organizations: No  . Attends Archivist Meetings: Never  . Marital Status: Divorced  Human resources officer Violence: Not At Risk  . Fear of Current or Ex-Partner: No  . Emotionally Abused: No  . Physically Abused: No  . Sexually Abused: No    Outpatient  Medications Prior to Visit  Medication Sig Dispense Refill  . albuterol (VENTOLIN HFA) 108 (90 Base) MCG/ACT inhaler Inhale 2 puffs into the lungs every 6 (six) hours as needed for wheezing or shortness of breath. 16 g 2  . azelastine (OPTIVAR) 0.05 % ophthalmic solution Place 1 drop into both eyes 2 (two) times daily. (Patient not taking: Reported on 03/08/2020) 6 mL 2  . budesonide-formoterol (SYMBICORT) 160-4.5 MCG/ACT inhaler Inhale 2 puffs into the lungs 2 (two) times daily. 1 Inhaler 3  . buPROPion (WELLBUTRIN XL) 150 MG 24 hr tablet Take 1 tablet (150 mg total) by mouth daily. 30 tablet 2  . cetirizine (ZYRTEC) 10 MG tablet TAKE ONE TABLET BY  MOUTH EVERY DAY 90 tablet 0  . cyclobenzaprine (FLEXERIL) 10 MG tablet Take 1 tablet (10 mg total) by mouth at bedtime. 30 tablet 0  . diphenhydrAMINE (BENADRYL) 25 mg capsule Take 25 mg by mouth every 6 (six) hours as needed.    Marland Kitchen FLUoxetine (PROZAC) 20 MG capsule Take 1 capsule (20 mg total) by mouth daily. 30 capsule 2  . fluticasone (FLONASE) 50 MCG/ACT nasal spray Place 2 sprays into both nostrils daily. 16 g 10  . ipratropium-albuterol (DUONEB) 0.5-2.5 (3) MG/3ML SOLN Take 3 mLs by nebulization every 6 (six) hours as needed. 360 mL 2  . montelukast (SINGULAIR) 10 MG tablet Take 1 tablet (10 mg total) by mouth at bedtime. 30 tablet 5  . omeprazole (PRILOSEC) 40 MG capsule Take 1 capsule (40 mg total) by mouth every evening. 30 capsule 10  . rosuvastatin (CRESTOR) 20 MG tablet Take 1 tablet (20 mg total) by mouth daily. 90 tablet 3  . traZODone (DESYREL) 50 MG tablet Take 1 tablet (50 mg total) by mouth at bedtime as needed. 30 tablet 2  . gabapentin (NEURONTIN) 100 MG capsule Take 1 capsule (100 mg total) by mouth 2 (two) times daily. 42 capsule 0  . levothyroxine (SYNTHROID) 125 MCG tablet TAKE ONE TABLET BY MOUTH EVERY DAY 60 tablet 0   No facility-administered medications prior to visit.    Allergies  Allergen Reactions  . Biaxin [Clarithromycin]     Patient reports itching, nausea  . Iodine     Iodine blisters skin    ROS Review of Systems  Constitutional: Negative.   HENT: Positive for dental problem.   Respiratory: Negative.   Cardiovascular: Negative.   Musculoskeletal: Positive for back pain (chronic low back pain).  Neurological: Negative.   Psychiatric/Behavioral: Negative.       Objective:    Physical Exam HENT:     Head: Normocephalic and atraumatic.     Mouth/Throat:     Dentition: Abnormal dentition (cavity).  Cardiovascular:     Rate and Rhythm: Normal rate and regular rhythm.     Pulses: Normal pulses.     Heart sounds: Normal heart sounds.   Pulmonary:     Effort: Pulmonary effort is normal.     Breath sounds: Normal breath sounds.  Musculoskeletal:        General: Tenderness (lower back with palpation) present.  Neurological:     General: No focal deficit present.     Mental Status: She is alert and oriented to person, place, and time. Mental status is at baseline.  Psychiatric:        Mood and Affect: Mood normal.        Behavior: Behavior normal.        Thought Content: Thought content normal.  Judgment: Judgment normal.     BP 120/74 (BP Location: Left Arm, Patient Position: Sitting, Cuff Size: Large)   Temp (!) 97.3 F (36.3 C)   Wt 184 lb (83.5 kg)   LMP  (LMP Unknown)   BMI 28.82 kg/m  Wt Readings from Last 3 Encounters:  07/26/20 184 lb (83.5 kg)  05/10/20 170 lb 8 oz (77.3 kg)  03/22/20 174 lb (78.9 kg)     Health Maintenance Due  Topic Date Due  . COVID-19 Vaccine (1) Never done  . COLONOSCOPY  Never done  . MAMMOGRAM  03/19/2015  . PAP SMEAR-Modifier  05/17/2018  . TETANUS/TDAP  10/20/2019  . INFLUENZA VACCINE  07/22/2020    There are no preventive care reminders to display for this patient.  Lab Results  Component Value Date   TSH 1.750 11/03/2019   Lab Results  Component Value Date   WBC 4.9 08/08/2019   HGB 14.1 08/08/2019   HCT 42.9 08/08/2019   MCV 98 (H) 08/08/2019   PLT 264 08/08/2019   Lab Results  Component Value Date   NA 141 08/08/2019   K 4.1 08/08/2019   CO2 25 08/08/2019   GLUCOSE 78 08/08/2019   BUN 18 08/08/2019   CREATININE 0.83 08/08/2019   BILITOT 0.3 08/08/2019   ALKPHOS 42 08/08/2019   AST 16 08/08/2019   ALT 13 08/08/2019   PROT 6.7 08/08/2019   ALBUMIN 4.8 08/08/2019   CALCIUM 9.4 08/08/2019   ANIONGAP 9 11/17/2018   Lab Results  Component Value Date   CHOL 183 08/08/2019   Lab Results  Component Value Date   HDL 56 08/08/2019   Lab Results  Component Value Date   LDLCALC 107 (H) 08/08/2019   Lab Results  Component Value Date    TRIG 100 08/08/2019   No results found for: CHOLHDL No results found for: HGBA1C    Assessment & Plan:    1. Chronic midline low back pain, unspecified whether sciatica present - She was advised to complete [facility] financial application, and she will continue to follow up with Dr Thereasa Parkin, K R. On 08/16/2020, and continue on her current treatment regimen. - gabapentin (NEURONTIN) 300 MG capsule; Take 1 capsule (300 mg total) by mouth 3 (three) times daily.  Dispense: 90 capsule; Refill: 3  2. Hypothyroidism, unspecified type - She's euthyroid and she will continue on her current medication, will recheck TSH. - TSH; Future - levothyroxine (SYNTHROID) 125 MCG tablet; Take 1 tablet (125 mcg total) by mouth daily.  Dispense: 30 tablet; Refill: 2  3. Health care maintenance - Routine labs will be checked. - Comp Met (CMET); Future - CBC w/Diff; Future - Lipid panel; Future - Ambulatory referral to Hematology / Oncology for Mammogram and Pap smear. - HgB A1c; Future  4. Dental cavity - She was provided with Dental application and referral.    Follow-up: Return in about 3 months (around 10/31/2020), or if symptoms worsen or fail to improve.    Mischa Pollard Jerold Coombe, NP

## 2020-08-01 ENCOUNTER — Other Ambulatory Visit: Payer: Self-pay | Admitting: Nurse Practitioner

## 2020-08-02 ENCOUNTER — Other Ambulatory Visit: Payer: Self-pay

## 2020-08-02 ENCOUNTER — Ambulatory Visit: Payer: Medicaid Other | Admitting: Licensed Clinical Social Worker

## 2020-08-02 ENCOUNTER — Telehealth: Payer: Self-pay | Admitting: Pharmacist

## 2020-08-02 DIAGNOSIS — F331 Major depressive disorder, recurrent, moderate: Secondary | ICD-10-CM

## 2020-08-02 DIAGNOSIS — F411 Generalized anxiety disorder: Secondary | ICD-10-CM

## 2020-08-02 NOTE — BH Specialist Note (Signed)
Integrated Behavioral Health Follow Up Visit Via Phone  MRN: [id number] Name: Audrey Richard  Type of Service: Integrated Behavioral Health- Individual/Family Interpretor:No. Interpretor Name and Language:  SUBJECTIVE: Audrey Richard is a 59 y.o. female accompanied by herself. Patient was referred by Hurman Horn NP for mental health. Patient reports the following symptoms/concerns: She discussed that she has just been working and keeping busy. She notes that she has not heard anything from [facility]. She notes that she cannot remember the first time she applied but cannot move forward with physical therapy or anything till the charity care is approved. She notes that she has mailed her application. She discussed spending time with her grand children. She denies suicidal and homicidal thoughts.  Duration of problem: ; Severity of problem: mild  OBJECTIVE: Mood: Euthymic and Affect: Appropriate Risk of harm to self or others: No plan to harm self or others  LIFE CONTEXT: Family and Social: see above. School/Work: see above. Self-Care: see above.  Life Changes: see above.  GOALS ADDRESSED: Patient will: 1.  Reduce symptoms of: stress  2.  Increase knowledge and/or ability of: self-management skills  3.  Demonstrate ability to: Increase healthy adjustment to current life circumstances  INTERVENTIONS: Interventions utilized:  Supportive Counseling was utilized by the clinician during today's follow up session. Clinician processed with the patient regarding how she has been doing since the last follow up session. Clinician measured the patient's anxiety and depression on a numerical scale. Clinician provided the patient with the contact information for [facility] for her to contact directly.  Standardized Assessments completed: GAD-7 and PHQ 9 GAD 7 10 PHQ 9 11 ASSESSMENT: Patient currently experiencing see above.  Patient may benefit from see  above.  PLAN: 1. Follow up with behavioral health clinician on :  2. Behavioral recommendations:  3. Referral(s): Integrated [facility] [facility] (In Clinic) 4. "From scale of 1-10, how likely are you to follow plan?":   Althia Forts, LCSW

## 2020-08-02 NOTE — Telephone Encounter (Signed)
08/02/2020 9:15:57 AM - Ventolin script to [facility] for refill  -- Rhetta Mura - Thursday, August 02, 2020 9:14 AM --Faxed script for Ventolin HFA Inhale 2 puffs into the lungs every 5 hours as needed for wheezing or shortness of breath #3 to [facility] for refill.

## 2020-08-22 ENCOUNTER — Ambulatory Visit: Payer: Medicaid Other | Admitting: Licensed Clinical Social Worker

## 2020-08-22 ENCOUNTER — Other Ambulatory Visit: Payer: Self-pay

## 2020-08-22 DIAGNOSIS — F331 Major depressive disorder, recurrent, moderate: Secondary | ICD-10-CM

## 2020-08-22 DIAGNOSIS — F411 Generalized anxiety disorder: Secondary | ICD-10-CM

## 2020-08-29 NOTE — BH Specialist Note (Signed)
Integrated Behavioral Health Follow Up Visit  MRN: [id number] Name: Audrey Richard  Type of Service: Integrated Behavioral Health- Individual/Family Interpretor:No. Interpretor Name and Language: None  SUBJECTIVE: Audrey Richard is a 59 y.o. female accompanied by Self Patient was referred by Hurman Horn for mental health Patient reports the following symptoms/concerns: The patient reported that not much has changed since her last appointment with Carey Bullocks, LCSW at [facility]. She reports that she has "just been doing okay, working a lot and being tired." She discussed her cats and how much company they are to her. She noted that she works second shift and would like to schedule early morning appointments with the clinician in the future. The patient says that she misses her previous therapist and wants the clinician to tell her hello when she sees her. The patient denies suicidal or homicidal thoughts.   Duration of problem: ; Severity of problem: moderate  OBJECTIVE: Mood: Euthymic and Affect: Appropriate Risk of harm to self or others: No plan to harm self or others  LIFE CONTEXT: Family and Social: see above School/Work: see above Self-Care: see above Life Changes: see above  GOALS ADDRESSED: Patient will: 1.  Reduce symptoms of: stress  2.  Increase knowledge and/or ability of: self-management skills  3.  Demonstrate ability to: Increase healthy adjustment to current life circumstances  INTERVENTIONS: Interventions utilized:  Supportive Counseling was utilized by clinician during today's follow up session. Clinician processed with patient how she has been doing since the last follow up session. Clinician used reflective listening to allow a safe space for client to vent her frustrations with her job. The clinician processed with the client her goals for treatment and her feeling regarding her previous therapist leaving. The clinician explained that missing  a previous therapist is a normal process. Clinician offered to relay any messages she had for her previous therapist. The clinician gave the patient instructions on how to reach her at [facility] if she needed support before her next appointment. The clinician measured the patients symptoms on a numerical scale.  Standardized Assessments completed: GAD-7 and PHQ 9  Gad-7 11 PHQ-9 10  ASSESSMENT: Patient currently experiencing see above   Patient may benefit from see above   PLAN: 1. Follow up with behavioral health clinician on : 09/04/2020 2. Behavioral recommendations: 3. Referral(s): Integrated [employer] (In Clinic) 4. "From scale of 1-10, how likely are you to follow plan?":   Judith Part, Student-Social Work

## 2020-09-04 ENCOUNTER — Ambulatory Visit: Payer: Medicaid Other | Admitting: Licensed Clinical Social Worker

## 2020-09-04 ENCOUNTER — Other Ambulatory Visit: Payer: Self-pay

## 2020-09-04 DIAGNOSIS — F331 Major depressive disorder, recurrent, moderate: Secondary | ICD-10-CM

## 2020-09-04 DIAGNOSIS — F411 Generalized anxiety disorder: Secondary | ICD-10-CM
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
## 2020-09-06 ENCOUNTER — Other Ambulatory Visit: Payer: Self-pay

## 2020-09-06 MED ORDER — BUPROPION HCL ER (XL) 150 MG PO TB24: 150.0000 mg | ORAL_TABLET | Freq: Every day | ORAL | 2 refills | Status: DC

## 2020-09-06 MED ORDER — TRAZODONE HCL 50 MG PO TABS: 50.0000 mg | ORAL_TABLET | Freq: Every evening | ORAL | 2 refills | Status: DC | PRN

## 2020-09-06 MED ORDER — FLUOXETINE HCL 20 MG PO CAPS: 20.0000 mg | ORAL_CAPSULE | Freq: Every day | ORAL | 2 refills | Status: DC

## 2020-09-26 ENCOUNTER — Ambulatory Visit: Payer: Medicaid Other | Admitting: Licensed Clinical Social Worker

## 2020-09-27 ENCOUNTER — Telehealth: Payer: Self-pay | Admitting: Licensed Clinical Social Worker

## 2020-09-27 NOTE — Telephone Encounter (Signed)
Patient called back and rescheduled her appointment with Rhett Bannister and was informed that her medications are at [facility] for pick up.

## 2020-09-27 NOTE — Telephone Encounter (Signed)
Left message for patient letting her know that this was a call to reschedule her appointment with Rhett Bannister.

## 2020-10-03 ENCOUNTER — Telehealth: Payer: Self-pay | Admitting: Licensed Clinical Social Worker

## 2020-10-03 NOTE — Telephone Encounter (Signed)
Called patient to let her know that I needed to reschedule her appointment for October 20th due to her clinician, Rhett Bannister being unavailable that day.  Patient rescheduled her appointment.

## 2020-10-09 ENCOUNTER — Telehealth: Payer: Self-pay | Admitting: Licensed Clinical Social Worker

## 2020-10-09 NOTE — Telephone Encounter (Signed)
Talked to patient and rescheduled her appointment with Rhett Bannister due to provider not being available at noon on November 2nd.

## 2020-10-10 ENCOUNTER — Ambulatory Visit: Payer: Medicaid Other | Admitting: Licensed Clinical Social Worker

## 2020-10-15 ENCOUNTER — Other Ambulatory Visit: Payer: Self-pay | Admitting: Nurse Practitioner

## 2020-10-23 ENCOUNTER — Ambulatory Visit: Payer: Medicaid Other | Admitting: Licensed Clinical Social Worker

## 2020-10-24 ENCOUNTER — Other Ambulatory Visit: Payer: Medicaid Other

## 2020-10-24 ENCOUNTER — Other Ambulatory Visit: Payer: Self-pay

## 2020-10-24 DIAGNOSIS — Z0000 Encounter for general adult medical examination without abnormal findings: Secondary | ICD-10-CM

## 2020-10-24 DIAGNOSIS — E039 Hypothyroidism, unspecified: Secondary | ICD-10-CM

## 2020-10-25 LAB — LIPID PANEL
Chol/HDL Ratio: 3.8 ratio (ref 0.0–4.4)
Cholesterol, Total: 232 mg/dL — ABNORMAL HIGH (ref 100–199)
HDL: 61 mg/dL (ref >39)
LDL Chol Calc (NIH): 155 mg/dL — ABNORMAL HIGH (ref 0–99)
Triglycerides: 91 mg/dL (ref 0–149)
VLDL Cholesterol Cal: 16 mg/dL (ref 5–40)

## 2020-10-25 LAB — CBC WITH DIFFERENTIAL/PLATELET
Basophils Absolute: 0 10*3/uL (ref 0.0–0.2)
Basos: 0 %
EOS (ABSOLUTE): 0.2 10*3/uL (ref 0.0–0.4)
Eos: 3 %
Hematocrit: 41.1 % (ref 34.0–46.6)
Hemoglobin: 13.8 g/dL (ref 11.1–15.9)
Immature Grans (Abs): 0 10*3/uL (ref 0.0–0.1)
Immature Granulocytes: 0 %
Lymphocytes Absolute: 0.8 10*3/uL (ref 0.7–3.1)
Lymphs: 15 %
MCH: 32.2 pg (ref 26.6–33.0)
MCHC: 33.6 g/dL (ref 31.5–35.7)
MCV: 96 fL (ref 79–97)
Monocytes Absolute: 0.4 10*3/uL (ref 0.1–0.9)
Monocytes: 7 %
Neutrophils Absolute: 4 10*3/uL (ref 1.4–7.0)
Neutrophils: 75 %
Platelets: 252 10*3/uL (ref 150–450)
RBC: 4.29 x10E6/uL (ref 3.77–5.28)
RDW: 12.5 % (ref 11.7–15.4)
WBC: 5.4 10*3/uL (ref 3.4–10.8)

## 2020-10-25 LAB — COMPREHENSIVE METABOLIC PANEL
ALT: 15 IU/L (ref 0–32)
AST: 12 IU/L (ref 0–40)
Albumin/Globulin Ratio: 2.2 (ref 1.2–2.2)
Albumin: 4.2 g/dL (ref 3.8–4.9)
Alkaline Phosphatase: 47 IU/L (ref 44–121)
BUN/Creatinine Ratio: 11 (ref 9–23)
BUN: 12 mg/dL (ref 6–24)
Bilirubin Total: 0.3 mg/dL (ref 0.0–1.2)
CO2: 21 mmol/L (ref 20–29)
Calcium: 8.5 mg/dL — ABNORMAL LOW (ref 8.7–10.2)
Chloride: 103 mmol/L (ref 96–106)
Creatinine, Ser: 1.13 mg/dL — ABNORMAL HIGH (ref 0.57–1.00)
GFR calc Af Amer: 61 mL/min/{1.73_m2} (ref >59)
GFR calc non Af Amer: 53 mL/min/{1.73_m2} — ABNORMAL LOW (ref >59)
Globulin, Total: 1.9 g/dL (ref 1.5–4.5)
Glucose: 126 mg/dL — ABNORMAL HIGH (ref 65–99)
Potassium: 3.6 mmol/L (ref 3.5–5.2)
Sodium: 140 mmol/L (ref 134–144)
Total Protein: 6.1 g/dL (ref 6.0–8.5)

## 2020-10-25 LAB — HEMOGLOBIN A1C
Est. average glucose Bld gHb Est-mCnc: 117 mg/dL
Hgb A1c MFr Bld: 5.7 % — ABNORMAL HIGH (ref 4.8–5.6)

## 2020-10-25 LAB — TSH: TSH: 6.35 u[IU]/mL — ABNORMAL HIGH (ref 0.450–4.500)

## 2020-10-30 ENCOUNTER — Ambulatory Visit: Payer: Self-pay | Admitting: Licensed Clinical Social Worker

## 2020-10-30 ENCOUNTER — Other Ambulatory Visit: Payer: Self-pay | Admitting: Gerontology

## 2020-10-30 ENCOUNTER — Ambulatory Visit: Payer: Medicaid Other | Admitting: Licensed Clinical Social Worker

## 2020-10-30 ENCOUNTER — Telehealth: Payer: Self-pay | Admitting: Licensed Clinical Social Worker

## 2020-10-30 ENCOUNTER — Other Ambulatory Visit: Payer: Self-pay

## 2020-10-30 DIAGNOSIS — F411 Generalized anxiety disorder: Secondary | ICD-10-CM

## 2020-10-30 DIAGNOSIS — F324 Major depressive disorder, single episode, in partial remission: Secondary | ICD-10-CM

## 2020-10-30 DIAGNOSIS — F331 Major depressive disorder, recurrent, moderate: Secondary | ICD-10-CM

## 2020-10-30 MED ORDER — BUPROPION HCL ER (XL) 150 MG PO TB24: 150.0000 mg | ORAL_TABLET | Freq: Every day | ORAL | 2 refills | Status: DC

## 2020-10-30 MED ORDER — FLUOXETINE HCL 20 MG PO CAPS: 20.0000 mg | ORAL_CAPSULE | Freq: Every day | ORAL | 2 refills | Status: DC

## 2020-10-30 MED ORDER — TRAZODONE HCL 50 MG PO TABS: 50.0000 mg | ORAL_TABLET | Freq: Every evening | ORAL | 2 refills | Status: DC | PRN

## 2020-10-30 NOTE — Telephone Encounter (Signed)
Called patient to inform her of her next appointment

## 2020-10-30 NOTE — BH Specialist Note (Signed)
Integrated Behavioral Health Follow Up Visit  MRN: [id number] Name: Audrey Richard    Type of Service: Integrated Behavioral Health- Individual/Family Interpretor:No. Interpretor Name and Language: none  SUBJECTIVE: Audrey Richard is a 59 y.o. female accompanied by herself  Patient was referred by Audrey Horn, NP  for mental health . Patient reports the following symptoms/concerns: The patient reports that she stays tired all the time.She noted that she is sleeping well however wakes several times to go to the bathroom. The patient reports that she is taking her mediation as prescribed for her anxiety and depression and feels it is working for her. She stated that she doesn't feel that she is excessively worrying but does worry about things from time to time.The patient feels that most of the stress in her life comes from her job. She discussed her health and job stressor and her seasonal allegries. She discussed her plans for Thanksgiving and is looking forward to going out to eat this year instead of cooking a large meal.The patient denied any suicidal or homicidal thoughts.  Duration of problem: ; Severity of problem: moderate  OBJECTIVE: Mood: Euthymic and Affect: Appropriate Risk of harm to self or others: No plan to harm self or others  LIFE CONTEXT: Family and Social: see above School/Work: see above Self-Care: see above Life Changes: see above  GOALS ADDRESSED: Patient will: 1.  Reduce symptoms of: anxiety and depression  2.  Increase knowledge and/or ability of: coping skills, self-management skills and stress reduction  3.  Demonstrate ability to: Increase healthy adjustment to current life circumstances  INTERVENTIONS: Interventions utilized:  Supportive Counseling was utilized by the clinician during today's follow-up session. Clinician processed with the patient regarding how she has been doing since the last follow up session. Clinician measured the patient's  anxiety and depression on a numerical scale. Clinician explained to the patient that it sounds like despite the stress of her job that she has been handling things well. Clinician encouraged the patient to focus on the positives in her life rather than the negatives.  Standardized Assessments completed: GAD-7 and PHQ 9  GAD-7    5 PHQ-9   10   ASSESSMENT: Patient currently experiencing see above    Patient may benefit from see above  PLAN: 1. Follow up with behavioral health clinician on : 11/21/2020 @ 10:00 AM  2. Behavioral recommendations:  3. Referral(s): Integrated [employer] (In Clinic) 4. "From scale of 1-10, how likely are you to follow plan?":   Judith Part, Student-Social Work

## 2020-10-31 ENCOUNTER — Other Ambulatory Visit: Payer: Self-pay

## 2020-10-31 ENCOUNTER — Encounter: Payer: Self-pay | Admitting: Gerontology

## 2020-10-31 ENCOUNTER — Other Ambulatory Visit: Payer: Self-pay | Admitting: Gerontology

## 2020-10-31 ENCOUNTER — Ambulatory Visit: Payer: Medicaid Other | Admitting: Gerontology

## 2020-10-31 VITALS — BP 118/85 | HR 80 | Temp 98.1°F | Resp 16 | Wt 190.5 lb

## 2020-10-31 DIAGNOSIS — R7303 Prediabetes: Secondary | ICD-10-CM

## 2020-10-31 DIAGNOSIS — E039 Hypothyroidism, unspecified: Secondary | ICD-10-CM

## 2020-10-31 DIAGNOSIS — E785 Hyperlipidemia, unspecified: Secondary | ICD-10-CM

## 2020-10-31 DIAGNOSIS — J309 Allergic rhinitis, unspecified: Secondary | ICD-10-CM

## 2020-10-31 DIAGNOSIS — M545 Low back pain, unspecified: Secondary | ICD-10-CM

## 2020-10-31 MED ORDER — FLUTICASONE PROPIONATE 50 MCG/ACT NA SUSP: 2.0000 | Freq: Every day | NASAL | 1 refills | Status: DC

## 2020-10-31 MED ORDER — LEVOTHYROXINE SODIUM 137 MCG PO TABS: 125.0000 ug | ORAL_TABLET | Freq: Every day | ORAL | 2 refills | Status: DC

## 2020-10-31 MED ORDER — ROSUVASTATIN CALCIUM 20 MG PO TABS: 20.0000 mg | ORAL_TABLET | Freq: Every day | ORAL | 1 refills | Status: DC

## 2020-10-31 NOTE — Progress Notes (Signed)
Established Patient Office Visit  Subjective:  Patient ID: Audrey Richard, female    DOB: 07-Jun-1961  Age: 59 y.o. MRN: [id number]  CC:  Chief Complaint  Patient presents with  . Lab Review    HPI Audrey Richard presents for lab review. She had labs drawn on 10/24/20. Her A1c is 5.7% indicating prediabetes. She endorses feeling more hungry and thirsty than usual. Her cholesterol is 232 mg/dL which is up from 937 mg/dL one year ago. Her LDL is also elevated at 155 mg/dL. She states that she has not had her cholesterol medication for over one month. Her CBC was unremarkable and her CMET showed an elevated glucose of 126 mg/dL and elevated creatinine of 1.13 mg/dL. Lastly her TSH resulted at 6.350 uIU/mL indicating hypothyroidism, it is up from 1.750 uIU/mL last year. She reports compliance with her Synthroid. She states that she takes it in the morning about before eating or drinking. She endorses fatigue. She reports sinus congestion and rhinorrhea for about 1 month. States that this happens every year around this time.  Denies fever or cough. Reports compliance with other medications. Overall, patient states that she is doing well. She offers no further complaint.   Past Medical History:  Diagnosis Date  . Asthma   . Depression   . High cholesterol   . Osteoporosis   . Thyroid disease     Past Surgical History:  Procedure Laterality Date  . TUBAL LIGATION      Family History  Problem Relation Age of Onset  . Heart disease Mother   . Heart disease Father   . Diabetes Sister   . Diabetes Maternal Grandmother   . Heart disease Maternal Grandmother   . Diabetes Maternal Grandfather   . Heart disease Maternal Grandfather     Social History   Socioeconomic History  . Marital status: Divorced    Spouse name: Not on file  . Number of children: 3  . Years of education: Not on file  . Highest education level: Not on file  Occupational History  . Not on file  Tobacco  Use  . Smoking status: Former Smoker    Quit date: 11/15/2005    Years since quitting: 14.9  . Smokeless tobacco: Never Used  Vaping Use  . Vaping Use: Never used  Substance and Sexual Activity  . Alcohol use: Not Currently    Comment: pt states on occasion  . Drug use: No  . Sexual activity: Yes  Other Topics Concern  . Not on file  Social History Narrative   Social determinants completed. HS   Works at [employer] has 3 children.   Social Determinants of Health   Financial Resource Strain: Low Risk   . Difficulty of Paying Living Expenses: Not hard at all  Food Insecurity: Food Insecurity Present  . Worried About Programme researcher, broadcasting/film/video in the Last Year: Sometimes true  . Ran Out of Food in the Last Year: Never true  Transportation Needs: No Transportation Needs  . Lack of Transportation (Medical): No  . Lack of Transportation (Non-Medical): No  Physical Activity: Inactive  . Days of Exercise per Week: 0 days  . Minutes of Exercise per Session: 0 min  Stress: Stress Concern Present  . Feeling of Stress : To some extent  Social Connections: Socially Isolated  . Frequency of Communication with Friends and Family: Once a week  . Frequency of Social Gatherings with Friends and Family: Twice a week  . Attends  Religious Services: Never  . Active Member of Clubs or Organizations: No  . Attends Banker Meetings: Never  . Marital Status: Divorced  Catering manager Violence: Not At Risk  . Fear of Current or Ex-Partner: No  . Emotionally Abused: No  . Physically Abused: No  . Sexually Abused: No    Outpatient Medications Prior to Visit  Medication Sig Dispense Refill  . albuterol (VENTOLIN HFA) 108 (90 Base) MCG/ACT inhaler Inhale 2 puffs into the lungs every 6 (six) hours as needed for wheezing or shortness of breath. 16 g 2  . azelastine (OPTIVAR) 0.05 % ophthalmic solution Place 1 drop into both eyes 2 (two) times daily. (Patient not taking: Reported on 03/08/2020) 6  mL 2  . budesonide-formoterol (SYMBICORT) 160-4.5 MCG/ACT inhaler Inhale 2 puffs into the lungs 2 (two) times daily. 1 Inhaler 3  . buPROPion (WELLBUTRIN XL) 150 MG 24 hr tablet Take 1 tablet (150 mg total) by mouth daily. 30 tablet 2  . cetirizine (ZYRTEC) 10 MG tablet TAKE ONE TABLET BY MOUTH EVERY DAY 90 tablet 0  . cyclobenzaprine (FLEXERIL) 10 MG tablet Take 1 tablet (10 mg total) by mouth at bedtime. 30 tablet 0  . diphenhydrAMINE (BENADRYL) 25 mg capsule Take 25 mg by mouth every 6 (six) hours as needed.    Marland Kitchen FLUoxetine (PROZAC) 20 MG capsule Take 1 capsule (20 mg total) by mouth daily. 30 capsule 2  . gabapentin (NEURONTIN) 300 MG capsule Take 1 capsule (300 mg total) by mouth 3 (three) times daily. 90 capsule 3  . ipratropium-albuterol (DUONEB) 0.5-2.5 (3) MG/3ML SOLN Take 3 mLs by nebulization every 6 (six) hours as needed. 360 mL 2  . montelukast (SINGULAIR) 10 MG tablet Take 1 tablet (10 mg total) by mouth at bedtime. 30 tablet 5  . omeprazole (PRILOSEC) 40 MG capsule Take 1 capsule (40 mg total) by mouth every evening. 30 capsule 10  . traZODone (DESYREL) 50 MG tablet Take 1 tablet (50 mg total) by mouth at bedtime as needed. 30 tablet 2  . fluticasone (FLONASE) 50 MCG/ACT nasal spray Place 2 sprays into both nostrils daily. 16 g 10  . levothyroxine (SYNTHROID) 125 MCG tablet Take 1 tablet (125 mcg total) by mouth daily. 30 tablet 2  . rosuvastatin (CRESTOR) 20 MG tablet Take 1 tablet (20 mg total) by mouth daily. 90 tablet 3   No facility-administered medications prior to visit.    Allergies  Allergen Reactions  . Biaxin [Clarithromycin]     Patient reports itching, nausea  . Iodine     Iodine blisters skin    ROS Review of Systems  Constitutional: Positive for fatigue.  HENT: Positive for congestion, postnasal drip, rhinorrhea, sinus pressure and sinus pain.   Respiratory: Negative.   Cardiovascular: Negative.   Gastrointestinal: Negative.   Endocrine: Positive for  polydipsia and polyphagia.  Musculoskeletal: Positive for back pain.       Chronic  Skin: Negative.   Allergic/Immunologic: Positive for environmental allergies.  Neurological: Positive for headaches.      Objective:    Physical Exam Constitutional:      Appearance: Normal appearance.  Cardiovascular:     Rate and Rhythm: Normal rate and regular rhythm.     Heart sounds: Normal heart sounds.  Pulmonary:     Effort: Pulmonary effort is normal.     Breath sounds: Normal breath sounds.  Abdominal:     General: Abdomen is flat. Bowel sounds are normal.     Palpations:  Abdomen is soft.  Musculoskeletal:        General: Normal range of motion.  Skin:    General: Skin is warm and dry.     Capillary Refill: Capillary refill takes less than 2 seconds.  Neurological:     General: No focal deficit present.     Mental Status: She is alert and oriented to person, place, and time.     BP 118/85   Pulse 80   Temp 98.1 F (36.7 C)   Resp 16   Wt 190 lb 8 oz (86.4 kg)   LMP  (LMP Unknown)   SpO2 95%   BMI 29.84 kg/m  Wt Readings from Last 3 Encounters:  10/31/20 190 lb 8 oz (86.4 kg)  07/26/20 184 lb (83.5 kg)  05/10/20 170 lb 8 oz (77.3 kg)     Health Maintenance Due  Topic Date Due  . COVID-19 Vaccine (1) Never done  . COLONOSCOPY  Never done  . MAMMOGRAM  03/19/2015  . PAP SMEAR-Modifier  05/17/2018  . TETANUS/TDAP  10/20/2019  . INFLUENZA VACCINE  07/22/2020    There are no preventive care reminders to display for this patient.  Lab Results  Component Value Date   TSH 6.350 (H) 10/24/2020   Lab Results  Component Value Date   WBC 5.4 10/24/2020   HGB 13.8 10/24/2020   HCT 41.1 10/24/2020   MCV 96 10/24/2020   PLT 252 10/24/2020   Lab Results  Component Value Date   NA 140 10/24/2020   K 3.6 10/24/2020   CO2 21 10/24/2020   GLUCOSE 126 (H) 10/24/2020   BUN 12 10/24/2020   CREATININE 1.13 (H) 10/24/2020   BILITOT 0.3 10/24/2020   ALKPHOS 47  10/24/2020   AST 12 10/24/2020   ALT 15 10/24/2020   PROT 6.1 10/24/2020   ALBUMIN 4.2 10/24/2020   CALCIUM 8.5 (L) 10/24/2020   ANIONGAP 9 11/17/2018   Lab Results  Component Value Date   CHOL 232 (H) 10/24/2020   Lab Results  Component Value Date   HDL 61 10/24/2020   Lab Results  Component Value Date   LDLCALC 155 (H) 10/24/2020   Lab Results  Component Value Date   TRIG 91 10/24/2020   Lab Results  Component Value Date   CHOLHDL 3.8 10/24/2020   Lab Results  Component Value Date   HGBA1C 5.7 (H) 10/24/2020      Assessment & Plan:   1. Hypothyroidism, unspecified type Increased Synthroid dose to 137 mcg daily. Reinforced education to take this medication first thing in the morning and not to eat or drink for about 1 hour after. - levothyroxine (SYNTHROID) 137 MCG tablet; Take 1 tablet (137 mcg total) by mouth daily.  Dispense: 30 tablet; Refill: 2  2. Hyperlipidemia, unspecified hyperlipidemia type Resume Crestor as prescribed. Reviewed low cholesterol, low fat diet. - rosuvastatin (CRESTOR) 20 MG tablet; Take 1 tablet (20 mg total) by mouth daily.  Dispense: 90 tablet; Refill: 1  3. Prediabetes Prediabetes eating plan provided for patient. Educated on low carb, low concentrated sugar meals.  Encouraged to exercise as tolerated with a goal of 150 minutes per week.  4. Allergic rhinitis, unspecified seasonality, unspecified trigger Start Flonase as prescribed. Patient instructed to monitor symptoms and call the clinic for any worsening or new symptoms.  - fluticasone (FLONASE) 50 MCG/ACT nasal spray; Place 2 sprays into both nostrils daily.  Dispense: 16 g; Refill: 1  5. Chronic midline low back pain, unspecified whether  sciatica present Provided with [facility] application for Spine follow up [facility]  - Ambulatory referral to Orthopedic Surgery   Follow-up: Return in about 4 weeks (around 11/28/2020), or if  symptoms worsen or fail to improve.    Kathlene NovemberJake Damoney Julia, Student-NP

## 2020-10-31 NOTE — Patient Instructions (Signed)

## 2020-11-20 ENCOUNTER — Other Ambulatory Visit: Payer: Self-pay | Admitting: Gerontology

## 2020-11-20 DIAGNOSIS — J309 Allergic rhinitis, unspecified: Secondary | ICD-10-CM

## 2020-11-21 ENCOUNTER — Ambulatory Visit: Payer: Medicaid Other | Admitting: Licensed Clinical Social Worker

## 2020-11-21 ENCOUNTER — Other Ambulatory Visit: Payer: Self-pay

## 2020-11-21 ENCOUNTER — Ambulatory Visit: Payer: Self-pay | Admitting: Licensed Clinical Social Worker

## 2020-11-21 DIAGNOSIS — F411 Generalized anxiety disorder: Secondary | ICD-10-CM

## 2020-11-21 DIAGNOSIS — F324 Major depressive disorder, single episode, in partial remission: Secondary | ICD-10-CM

## 2020-11-21 NOTE — BH Specialist Note (Signed)
Integrated Behavioral Health Follow Up In-Person Visit  MRN: [id number] Name: Audrey Richard   Total time: 30 minutes  Types of Service: Health & Behavioral Assessment/Intervention  Interpretor:No. Interpretor Name and Language: none  Subjective: Audrey Richard is a 59 y.o. female accompanied by herself Patient was referred by Hurman Horn, NP  for mental health  Patient reports the following symptoms/concerns: The patient noted that she has a good Thanksgiving and was able to go out to celebrate with her youngest daughter, son, and friends. She stated that the food was good and she enjoyed the break. The patient discussed her cats, and her daughter's many animals and pets. The patient stated,"things have been going good." She noted that she is tired throughout the day but is sleeping well. She reported that she is worrying a lot but feels that is just a part of who she is. The patient noted that she was told she is prediabetic and needed to lose some weight. The patient said she has been gaining a lot of weight recently. The patient discussed financial and familial stressors. She noted that she would need refills on her psychotropic medications before her next appointment in January.The patient denied any suicidal or homicidal thoughts.   Duration of problem: ; Severity of problem: moderate  Objective: Mood: Euthymic and Affect: Appropriate Risk of harm to self or others: No plan to harm self or others  Life Context: Family and Social: see above School/Work: see above Self-Care: see above Life Changes: see above  Patient and/or Family's Strengths/Protective Factors: Social and Emotional competence and Concrete supports in place (healthy food, safe environments, etc.)  Goals Addressed: Patient will: 1.  Reduce symptoms of: anxiety and depression  2.  Increase knowledge and/or ability of: coping skills, healthy habits and self-management skills  3.  Demonstrate ability to:  Increase healthy adjustment to current life circumstances  Progress towards Goals: Ongoing  Interventions: Interventions utilized:  Supportive Counseling was utilized during today's follow up session. Clinician processed with the patient regarding how she has been doing since the last follow up session. Clinician measured the patient's anxiety and depression on a numerical scale. Clinician explained to the patient that it sounds like despite the stress of the holidays that she has been handling things well. Clinician discussed with the patient her holiday schedule and that she would make sure she had refills before the break. Clinician provided information on what to do if she needed assistance or experienced a crisis over the holiday break.  Standardized Assessments completed: GAD-7 and PHQ 9  GAD-7  10 PHQ-9 16   Assessment: Patient currently experiencing see above  Patient may benefit from see above   Plan: 1. Follow up with behavioral health clinician on : 01/03/2021 at 11:00 AM  2. Behavioral recommendations:  3. Referral(s): [facility] (In Clinic) 4. "From scale of 1-10, how likely are you to follow plan?":   Judith Part, Student-Social Work

## 2020-12-26 ENCOUNTER — Ambulatory Visit: Payer: Medicaid Other | Admitting: Licensed Clinical Social Worker

## 2020-12-30 ENCOUNTER — Other Ambulatory Visit: Payer: Self-pay | Admitting: Emergency Medicine

## 2020-12-30 ENCOUNTER — Emergency Department
Admission: EM | Admit: 2020-12-30 | Discharge: 2020-12-30 | Disposition: A | Discharge status: Home / Self Care | Payer: HRSA Program | Attending: Student in an Organized Health Care Education/Training Program | Admitting: Student in an Organized Health Care Education/Training Program

## 2020-12-30 ENCOUNTER — Emergency Department: Payer: HRSA Program

## 2020-12-30 ENCOUNTER — Other Ambulatory Visit: Payer: Self-pay

## 2020-12-30 ENCOUNTER — Encounter: Payer: Self-pay | Admitting: Emergency Medicine

## 2020-12-30 DIAGNOSIS — E039 Hypothyroidism, unspecified: Secondary | ICD-10-CM | POA: Diagnosis not present

## 2020-12-30 DIAGNOSIS — Z1152 Encounter for screening for COVID-19: Secondary | ICD-10-CM

## 2020-12-30 DIAGNOSIS — Z7951 Long term (current) use of inhaled steroids: Secondary | ICD-10-CM | POA: Diagnosis not present

## 2020-12-30 DIAGNOSIS — R109 Unspecified abdominal pain: Secondary | ICD-10-CM | POA: Insufficient documentation

## 2020-12-30 DIAGNOSIS — Z79899 Other long term (current) drug therapy: Secondary | ICD-10-CM | POA: Diagnosis not present

## 2020-12-30 DIAGNOSIS — Z87891 Personal history of nicotine dependence: Secondary | ICD-10-CM | POA: Diagnosis not present

## 2020-12-30 DIAGNOSIS — J45909 Unspecified asthma, uncomplicated: Secondary | ICD-10-CM | POA: Diagnosis not present

## 2020-12-30 DIAGNOSIS — R0981 Nasal congestion: Secondary | ICD-10-CM | POA: Diagnosis present

## 2020-12-30 DIAGNOSIS — U071 COVID-19: Secondary | ICD-10-CM | POA: Insufficient documentation

## 2020-12-30 DIAGNOSIS — R350 Frequency of micturition: Secondary | ICD-10-CM | POA: Insufficient documentation

## 2020-12-30 DIAGNOSIS — J22 Unspecified acute lower respiratory infection: Secondary | ICD-10-CM

## 2020-12-30 LAB — URINALYSIS, COMPLETE (UACMP) WITH MICROSCOPIC
Bacteria, UA: NONE SEEN
Bilirubin Urine: NEGATIVE
Glucose, UA: NEGATIVE mg/dL
Ketones, ur: NEGATIVE mg/dL
Nitrite: NEGATIVE
Protein, ur: NEGATIVE mg/dL
Specific Gravity, Urine: 1.012 (ref 1.005–1.030)
pH: 6 (ref 5.0–8.0)

## 2020-12-30 LAB — BASIC METABOLIC PANEL
Anion gap: 11 (ref 5–15)
BUN: 9 mg/dL (ref 6–20)
CO2: 23 mmol/L (ref 22–32)
Calcium: 8.6 mg/dL — ABNORMAL LOW (ref 8.9–10.3)
Chloride: 102 mmol/L (ref 98–111)
Creatinine, Ser: 0.87 mg/dL (ref 0.44–1.00)
GFR, Estimated: >60 mL/min (ref >60)
Glucose, Bld: 80 mg/dL (ref 70–99)
Potassium: 4 mmol/L (ref 3.5–5.1)
Sodium: 136 mmol/L (ref 135–145)

## 2020-12-30 LAB — CBC WITH DIFFERENTIAL/PLATELET
Abs Immature Granulocytes: 0.01 10*3/uL (ref 0.00–0.07)
Basophils Absolute: 0 10*3/uL (ref 0.0–0.1)
Basophils Relative: 0 %
Eosinophils Absolute: 0 10*3/uL (ref 0.0–0.5)
Eosinophils Relative: 0 %
HCT: 41.1 % (ref 36.0–46.0)
Hemoglobin: 13.5 g/dL (ref 12.0–15.0)
Immature Granulocytes: 0 %
Lymphocytes Relative: 19 %
Lymphs Abs: 0.7 10*3/uL (ref 0.7–4.0)
MCH: 31.4 pg (ref 26.0–34.0)
MCHC: 32.8 g/dL (ref 30.0–36.0)
MCV: 95.6 fL (ref 80.0–100.0)
Monocytes Absolute: 0.5 10*3/uL (ref 0.1–1.0)
Monocytes Relative: 13 %
Neutro Abs: 2.4 10*3/uL (ref 1.7–7.7)
Neutrophils Relative %: 68 %
Platelets: 194 10*3/uL (ref 150–400)
RBC: 4.3 MIL/uL (ref 3.87–5.11)
RDW: 13 % (ref 11.5–15.5)
WBC: 3.5 10*3/uL — ABNORMAL LOW (ref 4.0–10.5)
nRBC: 0 % (ref 0.0–0.2)

## 2020-12-30 MED ORDER — SODIUM CHLORIDE 0.9 % IV BOLUS
1000.0000 mL | Freq: Once | INTRAVENOUS | Status: AC
  Administered 2020-12-30: 1000 mL via INTRAVENOUS

## 2020-12-30 MED ORDER — DOXYCYCLINE HYCLATE 50 MG PO CAPS: 100.0000 mg | ORAL_CAPSULE | Freq: Two times a day (BID) | ORAL | 0 refills | Status: AC

## 2020-12-30 MED ORDER — ACETAMINOPHEN 325 MG PO TABS: 650.0000 mg | ORAL_TABLET | Freq: Once | ORAL | Status: DC

## 2020-12-30 MED ORDER — SODIUM CHLORIDE 0.9 % IV SOLN
1.0000 g | Freq: Once | INTRAVENOUS | Status: AC
  Administered 2020-12-30: 1 g via INTRAVENOUS
  Filled 2020-12-30: qty 10

## 2020-12-30 MED ORDER — IBUPROFEN 600 MG PO TABS
600.0000 mg | ORAL_TABLET | Freq: Once | ORAL | Status: AC
  Administered 2020-12-30: 600 mg via ORAL
  Filled 2020-12-30: qty 1

## 2020-12-30 MED ORDER — OXYCODONE-ACETAMINOPHEN 5-325 MG PO TABS
1.0000 | ORAL_TABLET | Freq: Once | ORAL | Status: AC
  Administered 2020-12-30: 1 via ORAL
  Filled 2020-12-30: qty 1

## 2020-12-30 MED ORDER — DEXAMETHASONE SODIUM PHOSPHATE 10 MG/ML IJ SOLN
10.0000 mg | Freq: Once | INTRAMUSCULAR | Status: AC
  Administered 2020-12-30: 10 mg via INTRAVENOUS
  Filled 2020-12-30: qty 1

## 2020-12-30 MED ORDER — ONDANSETRON HCL 4 MG/2ML IJ SOLN
4.0000 mg | Freq: Once | INTRAMUSCULAR | Status: AC
  Administered 2020-12-30: 4 mg via INTRAVENOUS
  Filled 2020-12-30: qty 2

## 2020-12-30 NOTE — Discharge Instructions (Addendum)
Your chest x-ray shows that you may have a pneumonia.  Please begin antibiotics in the morning.  Please use your albuterol inhaler and nebulizer every 4-6 hours.  Your Covid results should be ready tomorrow.  You can check your results on MyChart.  Please call your family doctor for a follow-up appointment this week.

## 2020-12-30 NOTE — ED Provider Notes (Signed)
[facility] Emergency Department Provider Note  ____________________________________________  Time seen: Approximately 3:09 PM  I have reviewed the triage vital signs and the nursing notes.   HISTORY  Chief Complaint Back Pain and flu like Sx's    HPI Audrey Richard is a 60 y.o. female that presents to the emergency department for evaluation of chills, nasal congestion, nonproductive cough, left-sided low to mid back pain, urinary frequency, body aches for 2 days. She has not checked her temperature. Patient does states she has chronic left mid to low back pain but seems to be worse for the last 2 days. Patient has not been vaccinated for COVID-19. Patient has a history of asthma and uses an inhaler and a nebulizer treatment. No shortness of breath, chest pain, vomiting, abdominal pain, diarrhea.   Past Medical History:  Diagnosis Date  . Asthma   . Depression   . High cholesterol   . Osteoporosis   . Thyroid disease     Patient Active Problem List   Diagnosis Date Noted  . Dental cavity 07/26/2020  . Chronic back pain 01/10/2020  . Acid reflux 09/29/2019  . Primary generalized (osteo)arthritis 09/29/2019  . Health care maintenance 09/29/2019  . Encounter to establish care 09/29/2019  . Dyspareunia, female 12/08/2017  . Allergic rhinitis 12/08/2017  . Osteopenia 06/11/2015  . Depression 06/11/2015  . Insomnia 06/11/2015  . Hip bursitis 06/11/2015  . Chronic pain 06/11/2015  . Asthma 06/11/2015  . Hypothyroidism 06/11/2015  . Hyperlipidemia 06/11/2015    Past Surgical History:  Procedure Laterality Date  . TUBAL LIGATION      Prior to Admission medications   Medication Sig Start Date End Date Taking? Authorizing Provider  doxycycline (VIBRAMYCIN) 50 MG capsule Take 2 capsules (100 mg total) by mouth 2 (two) times daily for 10 days. 12/30/20 01/09/21 Yes Enid Derry, PA-C  albuterol (VENTOLIN HFA) 108 (90 Base) MCG/ACT inhaler Inhale 2  puffs into the lungs every 6 (six) hours as needed for wheezing or shortness of breath. 08/30/19   Cannady, Corrie Dandy T, NP  azelastine (OPTIVAR) 0.05 % ophthalmic solution Place 1 drop into both eyes 2 (two) times daily. Patient not taking: Reported on 03/08/2020 08/30/19   Aura Dials T, NP  budesonide-formoterol (SYMBICORT) 160-4.5 MCG/ACT inhaler Inhale 2 puffs into the lungs 2 (two) times daily. 08/30/19   Cannady, Corrie Dandy T, NP  buPROPion (WELLBUTRIN XL) 150 MG 24 hr tablet Take 1 tablet (150 mg total) by mouth daily. 10/30/20 11/29/20  Iloabachie, Chioma E, NP  cetirizine (ZYRTEC) 10 MG tablet TAKE ONE TABLET BY MOUTH EVERY DAY 07/17/20   Iloabachie, Chioma E, NP  cyclobenzaprine (FLEXERIL) 10 MG tablet Take 1 tablet (10 mg total) by mouth at bedtime. 05/10/20   Iloabachie, Chioma E, NP  diphenhydrAMINE (BENADRYL) 25 mg capsule Take 25 mg by mouth every 6 (six) hours as needed.    [provider]  FLUoxetine (PROZAC) 20 MG capsule Take 1 capsule (20 mg total) by mouth daily. 10/30/20 11/29/20  Iloabachie, Chioma E, NP  fluticasone (FLONASE) 50 MCG/ACT nasal spray PLACE 2 SPRAYS INTO BOTH NOSTRILS EVERY DAY 11/20/20   Iloabachie, Chioma E, NP  gabapentin (NEURONTIN) 300 MG capsule Take 1 capsule (300 mg total) by mouth 3 (three) times daily. 07/26/20   Iloabachie, Chioma E, NP  ipratropium-albuterol (DUONEB) 0.5-2.5 (3) MG/3ML SOLN Take 3 mLs by nebulization every 6 (six) hours as needed. 08/30/19   Aura Dials T, NP  levothyroxine (SYNTHROID) 137 MCG tablet Take  1 tablet (137 mcg total) by mouth daily. 10/31/20   Iloabachie, Chioma E, NP  montelukast (SINGULAIR) 10 MG tablet Take 1 tablet (10 mg total) by mouth at bedtime. 08/30/19   Cannady, Corrie Dandy T, NP  omeprazole (PRILOSEC) 40 MG capsule Take 1 capsule (40 mg total) by mouth every evening. 01/10/20   Iloabachie, Chioma E, NP  rosuvastatin (CRESTOR) 20 MG tablet Take 1 tablet (20 mg total) by mouth daily. 10/31/20   Iloabachie, Chioma E, NP   traZODone (DESYREL) 50 MG tablet Take 1 tablet (50 mg total) by mouth at bedtime as needed. 10/30/20   Iloabachie, Chioma E, NP    Allergies Biaxin [clarithromycin] and Iodine  Family History  Problem Relation Age of Onset  . Heart disease Mother   . Heart disease Father   . Diabetes Sister   . Diabetes Maternal Grandmother   . Heart disease Maternal Grandmother   . Diabetes Maternal Grandfather   . Heart disease Maternal Grandfather     Social History Social History   Tobacco Use  . Smoking status: Former Smoker    Quit date: 11/15/2005    Years since quitting: 15.1  . Smokeless tobacco: Never Used  Vaping Use  . Vaping Use: Never used  Substance Use Topics  . Alcohol use: Not Currently    Comment: pt states on occasion  . Drug use: No     Review of Systems  Constitutional: Positive for chills. Eyes: No visual changes. No discharge. ENT: Positive for congestion and rhinorrhea. Cardiovascular: No chest pain. Respiratory: Positive for cough. No SOB. Gastrointestinal: No abdominal pain.  Positive for nausea. No diarrhea.  No constipation. Musculoskeletal: Positive for back pain. Skin: Negative for rash, abrasions, lacerations, ecchymosis. Neurological: Negative for headaches.   ____________________________________________   PHYSICAL EXAM:  VITAL SIGNS: ED Triage Vitals  Enc Vitals Group     BP 12/30/20 1148 131/74     Pulse Rate 12/30/20 1148 89     Resp 12/30/20 1148 16     Temp 12/30/20 1148 99.1 F (37.3 C)     Temp Source 12/30/20 1148 Oral     SpO2 12/30/20 1148 95 %     Weight 12/30/20 1145 195 lb (88.5 kg)     Height 12/30/20 1145 5\' 7"  (1.702 m)     Head Circumference --      Peak Flow --      Pain Score 12/30/20 1145 10     Pain Loc --      Pain Edu? --      Excl. in GC? --      Constitutional: Alert and oriented. Well appearing and in no acute distress. Eyes: Conjunctivae are normal. PERRL. EOMI. No discharge. Head: Atraumatic. ENT:  No frontal and maxillary sinus tenderness.      Ears: Tympanic membranes pearly gray with good landmarks. No discharge.      Nose: Mild congestion/rhinnorhea.      Mouth/Throat: Mucous membranes are moist. Oropharynx non-erythematous. Tonsils not enlarged. No exudates. Uvula midline. Neck: No stridor.   Hematological/Lymphatic/Immunilogical: No cervical lymphadenopathy. Cardiovascular: Normal rate, regular rhythm.  Good peripheral circulation. Respiratory: Normal respiratory effort without tachypnea or retractions. Lungs CTAB. Good air entry to the bases with no decreased or absent breath sounds. Gastrointestinal: Bowel sounds 4 quadrants. Soft and nontender to palpation. No guarding or rigidity. No palpable masses. No distention. Musculoskeletal: Full range of motion to all extremities. No gross deformities appreciated. Mild tenderness to palpation to left mid to low lumbar  paraspinal muscles. No midline tenderness. Normal gait. Neurologic:  Normal speech and language. No gross focal neurologic deficits are appreciated.  Skin:  Skin is warm, dry and intact. No rash noted. Psychiatric: Mood and affect are normal. Speech and behavior are normal. Patient exhibits appropriate insight and judgement.   ____________________________________________   LABS (all labs ordered are listed, but only abnormal results are displayed)  Labs Reviewed  URINALYSIS, COMPLETE (UACMP) WITH MICROSCOPIC - Abnormal; Notable for the following components:      Result Value   Color, Urine YELLOW (*)    APPearance HAZY (*)    Hgb urine dipstick MODERATE (*)    Leukocytes,Ua SMALL (*)    All other components within normal limits  CBC WITH DIFFERENTIAL/PLATELET - Abnormal; Notable for the following components:   WBC 3.5 (*)    All other components within normal limits  BASIC METABOLIC PANEL - Abnormal; Notable for the following components:   Calcium 8.6 (*)    All other components within normal limits  SARS  CORONAVIRUS 2 (TAT 6-24 HRS)  URINE CULTURE   ____________________________________________  EKG   ____________________________________________  RADIOLOGY   DG Chest 1 View  Result Date: 12/30/2020 CLINICAL DATA:  Cough and chills. EXAM: CHEST  1 VIEW COMPARISON:  January 04, 2019 FINDINGS: Coarsened lung markings bilaterally. There may be subtle developing infiltrate in the right upper lobe. The heart, hila, mediastinum, lungs, and pleura are otherwise unremarkable. IMPRESSION: Coarsened lung markings bilaterally may represent bronchitic change or atypical infection. There is suggestion of a more focal subtle developing infiltrate in the right upper lobe which could represent early developing pneumonia. Electronically Signed   By: Gerome Samavid  Williams III M.D   On: 12/30/2020 17:16   CT Renal Stone Study  Result Date: 12/30/2020 CLINICAL DATA:  60 year old female with hematuria. EXAM: CT ABDOMEN AND PELVIS WITHOUT CONTRAST TECHNIQUE: Multidetector CT imaging of the abdomen and pelvis was performed following the standard protocol without IV contrast. COMPARISON:  None. FINDINGS: Evaluation of this exam is limited in the absence of intravenous contrast. Lower chest: The visualized lung bases are clear. No intra-abdominal free air or free fluid. Hepatobiliary: No focal liver abnormality is seen. No gallstones, gallbladder wall thickening, or biliary dilatation. Pancreas: Unremarkable. No pancreatic ductal dilatation or surrounding inflammatory changes. Spleen: Normal in size without focal abnormality. Adrenals/Urinary Tract: The adrenal glands unremarkable. There is no hydronephrosis or nephrolithiasis on either side. Subcentimeter partially exophytic right renal posterior interpolar hypodense lesion is not characterized, possibly a cyst. The visualized ureters and urinary bladder appear unremarkable. Stomach/Bowel: There is a small hiatal hernia. There is moderate stool throughout the colon. There is  sigmoid diverticulosis without active inflammatory changes. There is no bowel obstruction or active inflammation. The appendix is normal. Vascular/Lymphatic: Moderate aortoiliac atherosclerotic disease. The IVC is unremarkable. No portal gas. There is no adenopathy. Reproductive: The uterus is anteverted and grossly unremarkable. No adnexal masses. Other: Small fat containing umbilical hernia. Musculoskeletal: Mild degenerative changes of the spine. No acute osseous pathology. IMPRESSION: 1. No acute intra-abdominal or pelvic pathology. No hydronephrosis or nephrolithiasis. 2. Sigmoid diverticulosis. No bowel obstruction. Normal appendix. 3. Aortic Atherosclerosis (ICD10-I70.0). Electronically Signed   By: Elgie CollardArash  Radparvar M.D.   On: 12/30/2020 16:56    ____________________________________________    PROCEDURES  Procedure(s) performed:    Procedures    Medications  ondansetron [facility](ZOFRAN) injection 4 mg (4 mg Intravenous Given 12/30/20 1613)  sodium chloride 0.9 % bolus 1,000 mL (0 mLs Intravenous Stopped 12/30/20 1759)  oxyCODONE-acetaminophen (PERCOCET/ROXICET) 5-325 MG per tablet 1 tablet (1 tablet Oral Given 12/30/20 1614)  dexamethasone (DECADRON) injection 10 mg (10 mg Intravenous Given 12/30/20 1755)  cefTRIAXone (ROCEPHIN) 1 g in sodium chloride 0.9 % 100 mL IVPB (1 g Intravenous New Bag/Given 12/30/20 1756)  ibuprofen (ADVIL) tablet 600 mg (600 mg Oral Given 12/30/20 1756)     ____________________________________________   INITIAL IMPRESSION / ASSESSMENT AND PLAN / ED COURSE  Pertinent labs & imaging results that were available during my care of the patient were reviewed by me and considered in my medical decision making (see chart for details).  Review of the [place] CSRS was performed in accordance of the [facility] prior to dispensing any controlled drugs.   Patient presented to the emergency department for evaluation of flulike symptoms. Vital signs and exam are reassuring. Patient has a mild  leukopenia, suspicious for a viral infection. Her chest x-ray shows some coarsened lung markings bilaterally, possibly due to her asthma. It also shows a possible infiltrate to the right lung. Her Covid test is in process. Patient has some small leukocytes on her urinalysis but no other signs of urinary tract infection. Urine was sent for culture. No signs of nephrolithiasis or kidney stone on her renal stone CT to explain her left-sided low back pain. I suspect that it is possible patient has COVID-19. We are unable to do a rapid department due to her flight today. Patient will be covered for pneumonia. She was given a dose of IV Rocephin and Decadron in the emergency department. She was given a liter of fluids. She will check her Covid results tomorrow. Patient appears well and is staying well hydrated. Patient should alternate tylenol and ibuprofen for fever. Patient feels comfortable going home. Patient will be discharged home with prescriptions for doxycycline. She is allergic to azithromycin. Patient is to follow up with primary care as needed or otherwise directed. Patient is given ED precautions to return to the ED for any worsening or new symptoms.   Audrey Richard was evaluated in Emergency Department on 12/30/2020 for the symptoms described in the history of present illness. She was evaluated in the context of the global COVID-19 pandemic, which necessitated consideration that the patient might be at risk for infection with the SARS-CoV-2 virus that causes COVID-19. Institutional protocols and algorithms that pertain to the evaluation of patients at risk for COVID-19 are in a state of rapid change based on information released by regulatory bodies including the CDC and federal and state organizations. These policies and algorithms were followed during the patient's care in the ED.  ____________________________________________  FINAL CLINICAL IMPRESSION(S) / ED DIAGNOSES  Final diagnoses:  Upper  respiratory tract infection, unspecified type      NEW MEDICATIONS STARTED DURING THIS VISIT:  ED Discharge Orders         Ordered    doxycycline (VIBRAMYCIN) 50 MG capsule  2 times daily        12/30/20 1812              This chart was dictated using voice recognition software/Dragon. Despite best efforts to proofread, errors can occur which can change the meaning. Any change was purely unintentional.    Enid Derry, PA-C 12/30/20 1846    Willy Eddy, MD 01/03/21 2302

## 2020-12-30 NOTE — ED Triage Notes (Signed)
Pt to ED via POV c/o body aches, loss of appetite, fever, chills, and headache. Pt states that she has had sx's x 2 days. Pt also c/o left kidney pain x "a long time". Pt is in NAD.

## 2020-12-31 LAB — SARS CORONAVIRUS 2 (TAT 6-24 HRS): SARS Coronavirus 2: POSITIVE — AB

## 2021-01-01 LAB — URINE CULTURE: Culture: <10000 — AB

## 2021-01-03 ENCOUNTER — Other Ambulatory Visit: Payer: Self-pay

## 2021-01-03 ENCOUNTER — Ambulatory Visit: Payer: Medicaid Other | Admitting: Licensed Clinical Social Worker

## 2021-01-03 ENCOUNTER — Ambulatory Visit: Payer: Self-pay | Admitting: Licensed Clinical Social Worker

## 2021-01-03 ENCOUNTER — Ambulatory Visit: Payer: Medicaid Other | Admitting: Gerontology

## 2021-01-03 DIAGNOSIS — F324 Major depressive disorder, single episode, in partial remission: Secondary | ICD-10-CM

## 2021-01-03 DIAGNOSIS — F411 Generalized anxiety disorder: Secondary | ICD-10-CM

## 2021-01-03 NOTE — BH Specialist Note (Signed)
Integrated Behavioral Health Follow Up In-Person Visit  MRN: [id number] Name: Audrey Richard   Total time: 30 minutes  Types of Service: Telephone visit  Interpretor:No. Interpretor Name and Language:   Subjective: Audrey Richard is a 60 y.o. female accompanied by herself Patient was referred by Hurman Horn, NP  for mental health . Patient reports the following symptoms/concerns: The patient reports that she tested positive for COVID four days ago, but wanted to try to go ahead with today's appointment. She notes that four days ago she went to the Emergency Department for what she thought was another kidney infection, and was surprised to find out she was COVID positive. She states that she is now at home and in a lot of discomfort. She notes that she needs refills on her psychotropic mediations and that her daughter is bringing her supplies and will pick up her prescriptions from medication management for her. She stated that she was feeling bad and wanted to end the session early. The patient denied any suicidal or homicidal thoughts.  Duration of problem: ; Severity of problem: moderate  Objective: Mood: Euthymic and Affect: Appropriate Risk of harm to self or others: No plan to harm self or others  Life Context: Family and Social: see above School/Work: see above Self-Care: see above Life Changes: see above  Patient and/or Family's Strengths/Protective Factors: Concrete supports in place (healthy food, safe environments, etc.)  Goals Addressed: Patient will: 1.  Reduce symptoms of: anxiety, depression and stress  2.  Increase knowledge and/or ability of: coping skills, healthy habits and self-management skills  3.  Demonstrate ability to: Increase healthy adjustment to current life circumstances  Progress towards Goals: Ongoing  Interventions: Interventions utilized:  Supportive Counseling was utilized by the clinician during today's follow up session. The  clinician processed with the patient how she has been since the last follow-up visit. The clinician provided a space for the patient to ventilate her frustrations and concerns regarding her current situation. Clinician send her medication refill request to her provider and scheduled the patient for a follow-up appointment.   Standardized Assessments completed: Patient was unable to continue the session due to illness.   Assessment: Patient currently experiencing see above.   Patient may benefit from see above  Plan: 1. Follow up with behavioral health clinician on : 01/15/2021 at 12:00 PM  2. Behavioral recommendations:  3. Referral(S): Integrated Behavioral Health Services (In Clinic) 4. "From scale of 1-10, how likely are you to follow plan?":  Judith Part, Student-Social Work

## 2021-01-08 ENCOUNTER — Other Ambulatory Visit: Payer: Self-pay | Admitting: Gerontology

## 2021-01-08 DIAGNOSIS — F331 Major depressive disorder, recurrent, moderate: Secondary | ICD-10-CM

## 2021-01-08 MED ORDER — FLUOXETINE HCL 20 MG PO CAPS: 20.0000 mg | ORAL_CAPSULE | Freq: Every day | ORAL | 2 refills | Status: DC

## 2021-01-08 MED ORDER — TRAZODONE HCL 50 MG PO TABS: 50.0000 mg | ORAL_TABLET | Freq: Every evening | ORAL | 2 refills | Status: DC | PRN

## 2021-01-08 MED ORDER — BUPROPION HCL ER (XL) 150 MG PO TB24: 150.0000 mg | ORAL_TABLET | Freq: Every day | ORAL | 2 refills | Status: DC

## 2021-01-14 ENCOUNTER — Other Ambulatory Visit: Payer: Self-pay | Admitting: Gerontology

## 2021-01-14 DIAGNOSIS — K219 Gastro-esophageal reflux disease without esophagitis: Secondary | ICD-10-CM

## 2021-01-15 ENCOUNTER — Other Ambulatory Visit: Payer: Self-pay | Admitting: Gerontology

## 2021-01-15 ENCOUNTER — Ambulatory Visit: Payer: Medicaid Other | Admitting: Licensed Clinical Social Worker

## 2021-01-15 ENCOUNTER — Other Ambulatory Visit: Payer: Self-pay

## 2021-01-15 ENCOUNTER — Ambulatory Visit: Payer: Self-pay | Admitting: Licensed Clinical Social Worker

## 2021-01-15 DIAGNOSIS — F331 Major depressive disorder, recurrent, moderate: Secondary | ICD-10-CM

## 2021-01-15 NOTE — BH Specialist Note (Signed)
Integrated Behavioral Health Follow Up In-Person Visit  MRN: [id number] Name: Audrey Richard   Total time: 60 minutes  Types of Service: General Behavioral Integrated Care (BHI)  Interpretor:No. Interpretor Name and Language: N/A  Subjective: Audrey Richard is a 60 y.o. female accompanied by herself Patient was referred by Hurman Horn, NP for mental health Patient reports the following symptoms/concerns: The patient reports that she has went back to work after having COVID. She states that she had the SARS 2 virus and feels  that she didn't get the most severe type. She states that she went to the hospital because her left kidney was  hurting her and they discovered she had COVID when they did an xray. The patient discussed other health and financial stressors. She noted that COVID has caused her body pain and she is still having problems with smell and taste. She reports that she is still trying to recover but is grateful to be recovering well. The patient states that her anxiety is better and she is doing well overall. The patient denies any suicidal or homicidal thoughts.  Duration of problem: ; Severity of problem: moderate  Objective: Mood: Euthymic and Affect: Appropriate Risk of harm to self or others: No plan to harm self or others  Life Context: Family and Social: see above School/Work: see above Self-Care: see above Life Changes: see above  Patient and/or Family's Strengths/Protective Factors: Concrete supports in place (healthy food, safe environments, etc.)  Goals Addressed: Patient will: 1.  Reduce symptoms of: anxiety, depression and stress  2.  Increase knowledge and/or ability of: coping skills, healthy habits and stress reduction  3.  Demonstrate ability to: Increase healthy adjustment to current life circumstances  Progress towards Goals: Ongoing  Interventions: Interventions utilized:  Supportive Counseling was utilized by the clinician during  today's follow up session. The clinician processed with the patient how she has been since the last follow-up visit. The clinician provided a space for the patient to ventilate her frustrations and concerns regarding her current situation. The clinician explained to the patient that the clinic had moved to the new location and gave the patient the directions. The clinician measured the patients anxiety and depression on a numerical scale. The clinician encouraged the patient to continue to utilize self care and to continue to utilize her coping skills to deal with her current life circumstances.  Standardized Assessments completed: GAD-7 and PHQ 9  GAD-7    7 PHQ-9  10  Assessment: Patient currently experiencing above  Patient may benefit from see above  Plan: 1. Follow up with behavioral health clinician on : 02/05/2021  At 10:00 AM  2. Behavioral recommendations:  3. Referral(s): Integrated [employer] (In Clinic) 4. "From scale of 1-10, how likely are you to follow plan?":   Judith Part, Student-Social Work

## 2021-01-16 ENCOUNTER — Other Ambulatory Visit: Payer: Self-pay | Admitting: Gerontology

## 2021-01-16 ENCOUNTER — Ambulatory Visit: Payer: Medicaid Other | Admitting: Pharmacist

## 2021-01-16 ENCOUNTER — Other Ambulatory Visit: Payer: Self-pay

## 2021-01-16 DIAGNOSIS — K219 Gastro-esophageal reflux disease without esophagitis: Secondary | ICD-10-CM

## 2021-01-16 DIAGNOSIS — J309 Allergic rhinitis, unspecified: Secondary | ICD-10-CM

## 2021-01-16 DIAGNOSIS — Z79899 Other long term (current) drug therapy: Secondary | ICD-10-CM

## 2021-01-16 MED ORDER — FLUTICASONE PROPIONATE 50 MCG/ACT NA SUSP: NASAL | 0 refills | Status: DC

## 2021-01-16 NOTE — Progress Notes (Signed)
Medication Management Clinic Visit Note  [name] MRN: [id number] Date of Birth: [birth year] PCP: Rolm Gala, NP   [name] 60 y.o. female presents for a telephone visit for an annual medication management today. Verified patient with two identifiers.  LMP  (LMP Unknown)   Patient Information   Past Medical History:  Diagnosis Date  . Asthma   . Depression   . High cholesterol   . Osteoporosis   . Thyroid disease       Past Surgical History:  Procedure Laterality Date  . TUBAL LIGATION       Family History  Problem Relation Age of Onset  . Heart disease Mother   . Heart disease Father   . Diabetes Sister   . Diabetes Maternal Grandmother   . Heart disease Maternal Grandmother   . Diabetes Maternal Grandfather   . Heart disease Maternal Grandfather     New Diagnoses (since last visit): N/A  Family Support: Good  Lifestyle Diet: Breakfast: cereal Lunch: sandwich Dinner: protein and veggies  Drinks: water, soda, juice     Current Exercise Habits: The patient has a physically strenuous job, but has no regular exercise apart from work., Intensity: Mild       Social History   Substance and Sexual Activity  Alcohol Use Not Currently   Comment: pt states on occasion      Social History   Tobacco Use  Smoking Status Former Smoker  . Quit date: 11/15/2005  . Years since quitting: 15.1  Smokeless Tobacco Never Used      Health Maintenance  Topic Date Due  . COVID-19 Vaccine (1) Never done  . COLONOSCOPY (Pts 45-35yrs Insurance coverage will need to be confirmed)  Never done  . MAMMOGRAM  03/19/2015  . PAP SMEAR-Modifier  05/17/2018  . TETANUS/TDAP  10/20/2019  . INFLUENZA VACCINE  07/22/2020  . Hepatitis C Screening  Completed  . HIV Screening  Completed   Health Maintenance/Date Completed  Last ED visit: 12/30/2020 Last Visit to PCP: 10/31/2020 (appt from 01/03/2021 cancelled) Next Visit to PCP:  01/22/2021 Specialist Visit: 02/05/2021 - [facility] Dental Exam: unknown Eye Exam: unknown  Pelvic/PAP Exam: 2016 Mammogram: 2019 Colonoscopy: N/A Flu Vaccine: 07/22/2020 COVID-19 Vaccine: recently dx with COVID  Shingrix Vaccine: due  Outpatient Encounter Medications as of 01/16/2021  Medication Sig  . albuterol (VENTOLIN HFA) 108 (90 Base) MCG/ACT inhaler Inhale 2 puffs into the lungs every 6 (six) hours as needed for wheezing or shortness of breath.  . budesonide-formoterol (SYMBICORT) 160-4.5 MCG/ACT inhaler Inhale 2 puffs into the lungs 2 (two) times daily.  Marland Kitchen buPROPion (WELLBUTRIN XL) 150 MG 24 hr tablet Take 1 tablet (150 mg total) by mouth daily.  . cetirizine (ZYRTEC) 10 MG tablet TAKE ONE TABLET BY MOUTH EVERY DAY  . cyclobenzaprine (FLEXERIL) 10 MG tablet Take 1 tablet (10 mg total) by mouth at bedtime. (Patient taking differently: Take 10 mg by mouth daily as needed for muscle spasms.)  . FLUoxetine (PROZAC) 20 MG capsule Take 1 capsule (20 mg total) by mouth daily.  Marland Kitchen gabapentin (NEURONTIN) 300 MG capsule Take 1 capsule (300 mg total) by mouth 3 (three) times daily.  Marland Kitchen ipratropium-albuterol (DUONEB) 0.5-2.5 (3) MG/3ML SOLN Take 3 mLs by nebulization every 6 (six) hours as needed.  Marland Kitchen levothyroxine (SYNTHROID) 137 MCG tablet Take 1 tablet (137 mcg total) by mouth daily.  Marland Kitchen omeprazole (PRILOSEC) 40 MG capsule TAKE ONE CAPSULE BY MOUTH EVERY EVENING  . rosuvastatin (CRESTOR)  20 MG tablet Take 1 tablet (20 mg total) by mouth daily.  . traZODone (DESYREL) 50 MG tablet Take 1 tablet (50 mg total) by mouth at bedtime as needed. (Patient taking differently: Take 50 mg by mouth at bedtime. Pt reports sometimes taking 1/2 (25mg ) tablet)  . azelastine (OPTIVAR) 0.05 % ophthalmic solution Place 1 drop into both eyes 2 (two) times daily. (Patient not taking: No sig reported)  . diphenhydrAMINE (BENADRYL) 25 mg capsule Take 25 mg by mouth every 6 (six) hours as needed. (Patient not taking: Reported on  01/16/2021)  . fluticasone (FLONASE) 50 MCG/ACT nasal spray PLACE 2 SPRAYS INTO BOTH NOSTRILS EVERY DAY (Patient not taking: Reported on 01/16/2021)  . montelukast (SINGULAIR) 10 MG tablet Take 1 tablet (10 mg total) by mouth at bedtime. (Patient not taking: Reported on 01/16/2021)   No facility-administered encounter medications on file as of 01/16/2021.     Assessment and Plan: Asthma Pt currently takes Symbicort, duoneb, and albuterol. She has had to use her albuterol and duoneb a little more often than normal due to her recent COVID pneumonia but otherwise states she does not normally need to use it very often. Pt stated she uses 2-3 puffs of symbicort each time. Educated pt on proper dose/administration and instructed pt to use her rescue inhaler when she feels SOB instead of her maintenance in haler.   MDD Pt takes bupropion, fluoxetine, and trazodone. She denies any recent changes in mood or behavior and did not have any complaints at this time. Continue current regimen and report to PCP if symptoms worsen.  Hypothyroidism Pt is currently on levothyroxine for which the dose was increased back in 10/2020 based on TSH 6.350. Pt reports taking first thing in the morning before any other medications or food. Continue current regimen.   HLD Pt currently takes rosuvastatin. Last lipid panel was from 10/24/2020: TC 232; LDL 155. Pt should continue low fat/cholesterol diet and exercise as tolerated.   Chronic pain Pt currently takes gabapentin and cyclobenzaprine for pain/muscle spasms. She reports only needed to take the cyclobenzaprine PRN and states these medications are working well for her. Continue current regimen.   GERD Pt currently takes omeprazole and does not have any complaints at this time.   Allergies Pt takes diphenhydramine PRN and cetirizine. She states she has been using OTC eye drops since she ran out of her azelastine and reports some relief but requesting for azelastine  since it worked better for her - will send request to PCP to see if appropriate to restart.   Access/Adherence Pt uses a weekly pill box and states she needs refills on her fluticasone, and omeprazole - will put refills in.   13/02/2020, PharmD Pharmacy Resident  01/16/2021 9:59 AM

## 2021-01-22 ENCOUNTER — Ambulatory Visit
Admission: RE | Admit: 2021-01-22 | Discharge: 2021-01-22 | Disposition: A | Discharge status: Home / Self Care | Payer: Self-pay | Source: Ambulatory Visit | Attending: Gerontology | Admitting: Gerontology

## 2021-01-22 ENCOUNTER — Ambulatory Visit: Payer: Medicaid Other | Admitting: Gerontology

## 2021-01-22 ENCOUNTER — Other Ambulatory Visit: Payer: Self-pay

## 2021-01-22 VITALS — BP 130/80 | HR 75 | Resp 16 | Wt 194.7 lb

## 2021-01-22 DIAGNOSIS — U099 Post covid-19 condition, unspecified: Secondary | ICD-10-CM

## 2021-01-22 DIAGNOSIS — R0602 Shortness of breath: Secondary | ICD-10-CM

## 2021-01-22 DIAGNOSIS — B948 Sequelae of other specified infectious and parasitic diseases: Secondary | ICD-10-CM | POA: Insufficient documentation

## 2021-01-22 DIAGNOSIS — R059 Cough, unspecified: Secondary | ICD-10-CM | POA: Insufficient documentation

## 2021-01-22 DIAGNOSIS — R0600 Dyspnea, unspecified: Secondary | ICD-10-CM | POA: Insufficient documentation

## 2021-01-22 MED ORDER — GUAIFENESIN ER 600 MG PO TB12: 600.0000 mg | ORAL_TABLET | Freq: Two times a day (BID) | ORAL | 0 refills | Status: DC

## 2021-01-22 MED ORDER — BENZONATATE 100 MG PO CAPS: 100.0000 mg | ORAL_CAPSULE | Freq: Three times a day (TID) | ORAL | 0 refills | Status: DC | PRN

## 2021-01-22 MED ORDER — VITAMIN C 100 MG PO TABS: 100.0000 mg | ORAL_TABLET | Freq: Every day | ORAL | 0 refills | Status: DC

## 2021-01-22 NOTE — Progress Notes (Signed)
Established Patient Office Visit  Subjective:  Patient ID: Audrey Richard, female    DOB: Jan 27, 1961  Age: 60 y.o. MRN: [id number]  CC: No chief complaint on file.   HPI Audrey Richard presents for follow up post ED visit. She was seen at the ED on 12/30/2020 for lower respiratory infection and was positive for Covid 19. Chest X ray done on 12/30/20 showed Coarsened lung markings bilaterally may represent bronchitic change or atypical infection. There is suggestion of a more focal subtle developing infiltrate in the right upper lobe which could represent early developing pneumonia.  She completed the dose of Doxycyline.  Currently, she continues to have productive cough with thick yellowish phelgm, shortness of breath with minimal exertion, such as walking into her house from the car, head congestion, rhinorrhea and fatigue.  She states that her cough keeps her up at night and she's not getting enough sleep. She denies fever, chills and reports that she feels like she's developing pneumonia. Overall, she's concerned that she's still feeling sick and not ready to resume work.  Past Medical History:  Diagnosis Date  . Asthma   . Depression   . High cholesterol   . Osteoporosis   . Thyroid disease     Past Surgical History:  Procedure Laterality Date  . TUBAL LIGATION      Family History  Problem Relation Age of Onset  . Heart disease Mother   . Heart disease Father   . Diabetes Sister   . Diabetes Maternal Grandmother   . Heart disease Maternal Grandmother   . Diabetes Maternal Grandfather   . Heart disease Maternal Grandfather     Social History   Socioeconomic History  . Marital status: Divorced    Spouse name: Not on file  . Number of children: 3  . Years of education: Not on file  . Highest education level: Not on file  Occupational History  . Not on file  Tobacco Use  . Smoking status: Former Smoker    Quit date: 11/15/2005    Years since quitting: 15.1  .  Smokeless tobacco: Never Used  Vaping Use  . Vaping Use: Never used  Substance and Sexual Activity  . Alcohol use: Not Currently    Comment: pt states on occasion  . Drug use: No  . Sexual activity: Yes  Other Topics Concern  . Not on file  Social History Narrative   Social determinants completed. HS   Works at [employer] has 3 children.   Social Determinants of Health   Financial Resource Strain: Low Risk   . Difficulty of Paying Living Expenses: Not hard at all  Food Insecurity: Food Insecurity Present  . Worried About Programme researcher, broadcasting/film/video in the Last Year: Sometimes true  . Ran Out of Food in the Last Year: Never true  Transportation Needs: No Transportation Needs  . Lack of Transportation (Medical): No  . Lack of Transportation (Non-Medical): No  Physical Activity: Inactive  . Days of Exercise per Week: 0 days  . Minutes of Exercise per Session: 0 min  Stress: Stress Concern Present  . Feeling of Stress : To some extent  Social Connections: Socially Isolated  . Frequency of Communication with Friends and Family: Once a week  . Frequency of Social Gatherings with Friends and Family: Twice a week  . Attends Religious Services: Never  . Active Member of Clubs or Organizations: No  . Attends Banker Meetings: Never  . Marital Status:  Divorced  Intimate Partner Violence: Not At Risk  . Fear of Current or Ex-Partner: No  . Emotionally Abused: No  . Physically Abused: No  . Sexually Abused: No    Outpatient Medications Prior to Visit  Medication Sig Dispense Refill  . albuterol (VENTOLIN HFA) 108 (90 Base) MCG/ACT inhaler Inhale 2 puffs into the lungs every 6 (six) hours as needed for wheezing or shortness of breath. 16 g 2  . budesonide-formoterol (SYMBICORT) 160-4.5 MCG/ACT inhaler Inhale 2 puffs into the lungs 2 (two) times daily. 1 Inhaler 3  . buPROPion (WELLBUTRIN XL) 150 MG 24 hr tablet Take 1 tablet (150 mg total) by mouth daily. 30 tablet 2  . cetirizine  (ZYRTEC) 10 MG tablet TAKE ONE TABLET BY MOUTH EVERY DAY 90 tablet 0  . cyclobenzaprine (FLEXERIL) 10 MG tablet Take 1 tablet (10 mg total) by mouth at bedtime. (Patient taking differently: Take 10 mg by mouth daily as needed for muscle spasms.) 30 tablet 0  . FLUoxetine (PROZAC) 20 MG capsule Take 1 capsule (20 mg total) by mouth daily. 30 capsule 2  . fluticasone (FLONASE) 50 MCG/ACT nasal spray PLACE 2 SPRAYS INTO BOTH NOSTRILS EVERY DAY 16 g 0  . gabapentin (NEURONTIN) 300 MG capsule Take 1 capsule (300 mg total) by mouth 3 (three) times daily. 90 capsule 3  . levothyroxine (SYNTHROID) 137 MCG tablet Take 1 tablet (137 mcg total) by mouth daily. 30 tablet 2  . omeprazole (PRILOSEC) 40 MG capsule TAKE ONE CAPSULE BY MOUTH EVERY EVENING 90 capsule 0  . rosuvastatin (CRESTOR) 20 MG tablet Take 1 tablet (20 mg total) by mouth daily. 90 tablet 1  . traZODone (DESYREL) 50 MG tablet Take 1 tablet (50 mg total) by mouth at bedtime as needed. (Patient taking differently: Take 50 mg by mouth at bedtime. Pt reports sometimes taking 1/2 (25mg ) tablet) 30 tablet 2  . azelastine (OPTIVAR) 0.05 % ophthalmic solution Place 1 drop into both eyes 2 (two) times daily. (Patient not taking: No sig reported) 6 mL 2  . ipratropium-albuterol (DUONEB) 0.5-2.5 (3) MG/3ML SOLN Take 3 mLs by nebulization every 6 (six) hours as needed. (Patient not taking: Reported on 01/22/2021) 360 mL 2  . diphenhydrAMINE (BENADRYL) 25 mg capsule Take 25 mg by mouth every 6 (six) hours as needed. (Patient not taking: No sig reported)    . montelukast (SINGULAIR) 10 MG tablet Take 1 tablet (10 mg total) by mouth at bedtime. (Patient not taking: No sig reported) 30 tablet 5   No facility-administered medications prior to visit.    Allergies  Allergen Reactions  . Biaxin [Clarithromycin]     Patient reports itching, nausea  . Iodine     Iodine blisters skin    ROS Review of Systems  Constitutional: Positive for fatigue. Negative for  chills and fever.  HENT: Positive for rhinorrhea, sinus pressure and sinus pain. Negative for sore throat.   Respiratory: Positive for cough and shortness of breath.   Cardiovascular: Negative.   Neurological: Negative.       Objective:    Physical Exam HENT:     Head: Normocephalic and atraumatic.     Mouth/Throat:     Mouth: Mucous membranes are moist.  Cardiovascular:     Rate and Rhythm: Normal rate and regular rhythm.     Pulses: Normal pulses.     Heart sounds: Normal heart sounds.  Pulmonary:     Effort: Pulmonary effort is normal.     Breath sounds: Normal  breath sounds.  Neurological:     General: No focal deficit present.     Mental Status: She is alert and oriented to person, place, and time. Mental status is at baseline.     BP 130/80 (BP Location: Left Arm, Patient Position: Sitting, Cuff Size: Large)   Pulse 75   Resp 16   Wt 194 lb 11.2 oz (88.3 kg)   LMP  (LMP Unknown)   SpO2 97%   BMI 30.49 kg/m  Wt Readings from Last 3 Encounters:  01/22/21 194 lb 11.2 oz (88.3 kg)  12/30/20 195 lb (88.5 kg)  10/31/20 190 lb 8 oz (86.4 kg)    Health Maintenance Due  Topic Date Due  . COVID-19 Vaccine (1) Never done  . COLONOSCOPY (Pts 45-55yrs Insurance coverage will need to be confirmed)  Never done  . MAMMOGRAM  03/19/2015  . PAP SMEAR-Modifier  05/17/2018  . TETANUS/TDAP  10/20/2019  . INFLUENZA VACCINE  07/22/2020    There are no preventive care reminders to display for this patient.  Lab Results  Component Value Date   TSH 6.350 (H) 10/24/2020   Lab Results  Component Value Date   WBC 3.5 (L) 12/30/2020   HGB 13.5 12/30/2020   HCT 41.1 12/30/2020   MCV 95.6 12/30/2020   PLT 194 12/30/2020   Lab Results  Component Value Date   NA 136 12/30/2020   K 4.0 12/30/2020   CO2 23 12/30/2020   GLUCOSE 80 12/30/2020   BUN 9 12/30/2020   CREATININE 0.87 12/30/2020   BILITOT 0.3 10/24/2020   ALKPHOS 47 10/24/2020   AST 12 10/24/2020   ALT 15  10/24/2020   PROT 6.1 10/24/2020   ALBUMIN 4.2 10/24/2020   CALCIUM 8.6 (L) 12/30/2020   ANIONGAP 11 12/30/2020   Lab Results  Component Value Date   CHOL 232 (H) 10/24/2020   Lab Results  Component Value Date   HDL 61 10/24/2020   Lab Results  Component Value Date   LDLCALC 155 (H) 10/24/2020   Lab Results  Component Value Date   TRIG 91 10/24/2020   Lab Results  Component Value Date   CHOLHDL 3.8 10/24/2020   Lab Results  Component Value Date   HGBA1C 5.7 (H) 10/24/2020      Assessment & Plan:   1. Cough - She continues to experience persistent cough with thick yellowish plegm, she was advised to continue on Mucinex during the day and Tessalon Perles at night. - guaiFENesin (MUCINEX) 600 MG 12 hr tablet; Take 1 tablet (600 mg total) by mouth 2 (two) times daily.  Dispense: 20 tablet; Refill: 0 - benzonatate (TESSALON PERLES) 100 MG capsule; Take 1 capsule (100 mg total) by mouth 3 (three) times daily as needed for cough.  Dispense: 20 capsule; Refill: 0 - Ascorbic Acid (VITAMIN C) 100 MG tablet; Take 1 tablet (100 mg total) by mouth daily.  Dispense: 20 tablet; Refill: 0  2. Persistent shortness of breath after COVID-19 - She was advised to continue using her Inhaler and referred for Chest x ray . She was advised to go to the ED for worsening symptoms. - DG Chest 2 View; Future    Follow-up: Return in about 2 days (around 01/24/2021), or if symptoms worsen or fail to improve.    Alfie Rideaux Trellis Paganini, NP

## 2021-01-24 ENCOUNTER — Other Ambulatory Visit: Payer: Self-pay | Admitting: Gerontology

## 2021-01-24 DIAGNOSIS — U099 Post covid-19 condition, unspecified: Secondary | ICD-10-CM

## 2021-01-24 DIAGNOSIS — B948 Sequelae of other specified infectious and parasitic diseases: Secondary | ICD-10-CM

## 2021-01-24 MED ORDER — PREDNISONE 20 MG PO TABS
20.0000 mg | ORAL_TABLET | Freq: Every day | ORAL | 0 refills | Status: DC
Start: 2021-01-24 — End: 2021-01-24

## 2021-01-29 ENCOUNTER — Encounter: Payer: Self-pay | Admitting: Gerontology

## 2021-01-29 ENCOUNTER — Other Ambulatory Visit: Payer: Self-pay

## 2021-01-29 ENCOUNTER — Ambulatory Visit: Payer: Medicaid Other | Admitting: Gerontology

## 2021-01-29 ENCOUNTER — Telehealth: Payer: Self-pay

## 2021-01-29 VITALS — BP 129/85 | HR 74 | Temp 97.7°F | Resp 16

## 2021-01-29 DIAGNOSIS — R059 Cough, unspecified: Secondary | ICD-10-CM

## 2021-01-29 DIAGNOSIS — Z0000 Encounter for general adult medical examination without abnormal findings: Secondary | ICD-10-CM

## 2021-01-29 DIAGNOSIS — R5383 Other fatigue: Secondary | ICD-10-CM | POA: Insufficient documentation

## 2021-01-29 MED ORDER — BENZONATATE 100 MG PO CAPS: 200.0000 mg | ORAL_CAPSULE | Freq: Two times a day (BID) | ORAL | 0 refills | Status: DC

## 2021-01-29 NOTE — Progress Notes (Signed)
Established Patient Office Visit  Subjective:  Patient ID: Audrey Richard, female    DOB: 06/12/1961  Age: 60 y.o. MRN: [id number]  CC: No chief complaint on file.   HPI Audrey Richard presents for follow up of cough and chest congestion . She was treated for upper respiratory tract infection at the ED on 12/30/20 with Doxycycline and tested positive for Covid 19. Chest X ray done on 12/30/20 showed Coarsened lung markings bilaterally may represent bronchitic change or atypical infection. There is suggestion of a more focal subtle developing infiltrate in the right upper lobe which could represent early developing pneumonia. She finished the course of Doxycycline ,was seen at the clinic on 01/22/21 for persistent productive cough and fatigue. She had another  Chest X ray done on 01/22/21 and it showed No acute cardiopulmonary findings. She was prescribed 20 mg Prednisone daily for 5 days, Muccinex and Tessalon pearls. Currently, she reports that her cough has improved 40%, states that the Tessalon pearles helps her to sleep at night without coughing. She continues to experience productive cough with decreased clear phlegm, intermittent rhinorrhea especially when she wakes up in the morning. She states that she has no energy and get tired easily. She denies wheezing, chest tightness, fever and chills.  Past Medical History:  Diagnosis Date  . Asthma   . Depression   . High cholesterol   . Osteoporosis   . Thyroid disease     Past Surgical History:  Procedure Laterality Date  . TUBAL LIGATION      Family History  Problem Relation Age of Onset  . Heart disease Mother   . Heart disease Father   . Diabetes Sister   . Diabetes Maternal Grandmother   . Heart disease Maternal Grandmother   . Diabetes Maternal Grandfather   . Heart disease Maternal Grandfather     Social History   Socioeconomic History  . Marital status: Divorced    Spouse name: Not on file  . Number of children: 3  .  Years of education: Not on file  . Highest education level: Not on file  Occupational History  . Not on file  Tobacco Use  . Smoking status: Former Smoker    Quit date: 11/15/2005    Years since quitting: 15.2  . Smokeless tobacco: Never Used  Vaping Use  . Vaping Use: Never used  Substance and Sexual Activity  . Alcohol use: Not Currently    Comment: pt states on occasion  . Drug use: No  . Sexual activity: Yes  Other Topics Concern  . Not on file  Social History Narrative   Social determinants completed. HS   Works at [employer] has 3 children.   Social Determinants of Health   Financial Resource Strain: Low Risk   . Difficulty of Paying Living Expenses: Not hard at all  Food Insecurity: Food Insecurity Present  . Worried About Programme researcher, broadcasting/film/video in the Last Year: Sometimes true  . Ran Out of Food in the Last Year: Never true  Transportation Needs: No Transportation Needs  . Lack of Transportation (Medical): No  . Lack of Transportation (Non-Medical): No  Physical Activity: Inactive  . Days of Exercise per Week: 0 days  . Minutes of Exercise per Session: 0 min  Stress: Stress Concern Present  . Feeling of Stress : To some extent  Social Connections: Socially Isolated  . Frequency of Communication with Friends and Family: Once a week  . Frequency of Social Gatherings  with Friends and Family: Twice a week  . Attends Religious Services: Never  . Active Member of Clubs or Organizations: No  . Attends Banker Meetings: Never  . Marital Status: Divorced  Catering manager Violence: Not At Risk  . Fear of Current or Ex-Partner: No  . Emotionally Abused: No  . Physically Abused: No  . Sexually Abused: No    Outpatient Medications Prior to Visit  Medication Sig Dispense Refill  . albuterol (VENTOLIN HFA) 108 (90 Base) MCG/ACT inhaler Inhale 2 puffs into the lungs every 6 (six) hours as needed for wheezing or shortness of breath. 16 g 2  . Ascorbic Acid  (VITAMIN C) 100 MG tablet Take 1 tablet (100 mg total) by mouth daily. 20 tablet 0  . budesonide-formoterol (SYMBICORT) 160-4.5 MCG/ACT inhaler Inhale 2 puffs into the lungs 2 (two) times daily. 1 Inhaler 3  . buPROPion (WELLBUTRIN XL) 150 MG 24 hr tablet Take 1 tablet (150 mg total) by mouth daily. 30 tablet 2  . cetirizine (ZYRTEC) 10 MG tablet TAKE ONE TABLET BY MOUTH EVERY DAY 90 tablet 0  . FLUoxetine (PROZAC) 20 MG capsule Take 1 capsule (20 mg total) by mouth daily. 30 capsule 2  . gabapentin (NEURONTIN) 300 MG capsule Take 1 capsule (300 mg total) by mouth 3 (three) times daily. 90 capsule 3  . guaiFENesin (MUCINEX) 600 MG 12 hr tablet Take 1 tablet (600 mg total) by mouth 2 (two) times daily. 20 tablet 0  . levothyroxine (SYNTHROID) 137 MCG tablet Take 1 tablet (137 mcg total) by mouth daily. 30 tablet 2  . omeprazole (PRILOSEC) 40 MG capsule TAKE ONE CAPSULE BY MOUTH EVERY EVENING 90 capsule 0  . predniSONE (DELTASONE) 20 MG tablet Take 1 tablet (20 mg total) by mouth daily with breakfast. 5 tablet 0  . rosuvastatin (CRESTOR) 20 MG tablet Take 1 tablet (20 mg total) by mouth daily. 90 tablet 1  . traZODone (DESYREL) 50 MG tablet Take 1 tablet (50 mg total) by mouth at bedtime as needed. (Patient taking differently: Take 50 mg by mouth at bedtime. Pt reports sometimes taking 1/2 (25mg ) tablet) 30 tablet 2  . benzonatate (TESSALON PERLES) 100 MG capsule Take 1 capsule (100 mg total) by mouth 3 (three) times daily as needed for cough. 20 capsule 0  . azelastine (OPTIVAR) 0.05 % ophthalmic solution Place 1 drop into both eyes 2 (two) times daily. (Patient not taking: No sig reported) 6 mL 2  . cyclobenzaprine (FLEXERIL) 10 MG tablet Take 1 tablet (10 mg total) by mouth at bedtime. (Patient taking differently: Take 10 mg by mouth daily as needed for muscle spasms.) 30 tablet 0  . fluticasone (FLONASE) 50 MCG/ACT nasal spray PLACE 2 SPRAYS INTO BOTH NOSTRILS EVERY DAY 16 g 0  .  ipratropium-albuterol (DUONEB) 0.5-2.5 (3) MG/3ML SOLN Take 3 mLs by nebulization every 6 (six) hours as needed. (Patient not taking: No sig reported) 360 mL 2   No facility-administered medications prior to visit.    Allergies  Allergen Reactions  . Biaxin [Clarithromycin]     Patient reports itching, nausea  . Iodine     Iodine blisters skin    ROS Review of Systems  Constitutional: Positive for fatigue.  HENT: Positive for rhinorrhea.   Respiratory: Positive for cough. Negative for chest tightness, shortness of breath and wheezing.   Cardiovascular: Negative.   Neurological: Negative.   Psychiatric/Behavioral: Negative.       Objective:    Physical Exam HENT:  Head: Normocephalic and atraumatic.  Cardiovascular:     Rate and Rhythm: Normal rate and regular rhythm.     Pulses: Normal pulses.     Heart sounds: Normal heart sounds.  Pulmonary:     Effort: Pulmonary effort is normal.     Breath sounds: Normal breath sounds.  Skin:    General: Skin is warm.  Neurological:     General: No focal deficit present.     Mental Status: She is alert and oriented to person, place, and time. Mental status is at baseline.  Psychiatric:        Mood and Affect: Mood normal.        Behavior: Behavior normal.        Thought Content: Thought content normal.        Judgment: Judgment normal.     BP 129/85 (BP Location: Right Arm, Patient Position: Sitting, Cuff Size: Normal)   Pulse 74   Temp 97.7 F (36.5 C)   Resp 16   LMP  (LMP Unknown)   SpO2 96%  Wt Readings from Last 3 Encounters:  01/22/21 194 lb 11.2 oz (88.3 kg)  12/30/20 195 lb (88.5 kg)  10/31/20 190 lb 8 oz (86.4 kg)     Health Maintenance Due  Topic Date Due  . COVID-19 Vaccine (1) Never done  . COLONOSCOPY (Pts 45-4yrs Insurance coverage will need to be confirmed)  Never done  . MAMMOGRAM  03/19/2015  . PAP SMEAR-Modifier  05/17/2018  . TETANUS/TDAP  10/20/2019  . INFLUENZA VACCINE  07/22/2020     There are no preventive care reminders to display for this patient.  Lab Results  Component Value Date   TSH 6.350 (H) 10/24/2020   Lab Results  Component Value Date   WBC 3.5 (L) 12/30/2020   HGB 13.5 12/30/2020   HCT 41.1 12/30/2020   MCV 95.6 12/30/2020   PLT 194 12/30/2020   Lab Results  Component Value Date   NA 136 12/30/2020   K 4.0 12/30/2020   CO2 23 12/30/2020   GLUCOSE 80 12/30/2020   BUN 9 12/30/2020   CREATININE 0.87 12/30/2020   BILITOT 0.3 10/24/2020   ALKPHOS 47 10/24/2020   AST 12 10/24/2020   ALT 15 10/24/2020   PROT 6.1 10/24/2020   ALBUMIN 4.2 10/24/2020   CALCIUM 8.6 (L) 12/30/2020   ANIONGAP 11 12/30/2020   Lab Results  Component Value Date   CHOL 232 (H) 10/24/2020   Lab Results  Component Value Date   HDL 61 10/24/2020   Lab Results  Component Value Date   LDLCALC 155 (H) 10/24/2020   Lab Results  Component Value Date   TRIG 91 10/24/2020   Lab Results  Component Value Date   CHOLHDL 3.8 10/24/2020   Lab Results  Component Value Date   HGBA1C 5.7 (H) 10/24/2020      Assessment & Plan:   1. Cough - Her chest X ray was normal, she will continue on 200 mg Benzonatate bid, advised to notify clinic with worsening cough. - benzonatate (TESSALON PERLES) 100 MG capsule; Take 2 capsules (200 mg total) by mouth 2 (two) times daily.  Dispense: 30 capsule; Refill: 0  2. Fatigue, unspecified type - Possible after effect of Covid, was advised to increase calorie and fluid intake, get rest and exercise as tolerated to start building her stamina up. - Vitamin D (25 hydroxy); Future  3. Health care maintenance -Routine labs will be checked. - HgB A1c; Future - Lipid  panel; Future - TSH; Future - Urinalysis; Future     Follow-up: Return in about 6 weeks (around 03/12/2021), or if symptoms worsen or fail to improve.    Lenus Trauger Trellis Paganini, NP

## 2021-01-29 NOTE — Telephone Encounter (Signed)
scheduled lab for 3/16 @ 12:15 and fu on 3/22 @ 11:30 per staff message

## 2021-01-29 NOTE — Patient Instructions (Signed)

## 2021-02-05 ENCOUNTER — Ambulatory Visit: Payer: Medicaid Other | Admitting: Licensed Clinical Social Worker

## 2021-02-05 ENCOUNTER — Ambulatory Visit: Payer: Self-pay | Admitting: Licensed Clinical Social Worker

## 2021-02-05 ENCOUNTER — Other Ambulatory Visit: Payer: Self-pay

## 2021-02-05 DIAGNOSIS — F331 Major depressive disorder, recurrent, moderate: Secondary | ICD-10-CM

## 2021-02-05 DIAGNOSIS — F411 Generalized anxiety disorder: Secondary | ICD-10-CM

## 2021-02-05 NOTE — BH Specialist Note (Signed)
Integrated Behavioral Health Follow Up In-Person Visit  MRN: [id number] Name: Audrey Richard  Total time: 30 minutes  Patient consents to telephone visit and 2 patient identifiers were used to identify patient.  Types of Service: General Behavioral Integrated Care (BHI)  Interpretor:No. Interpretor Name and Language:   Subjective: Audrey Richard is a 60 y.o. female accompanied by herself Patient was referred by  Hurman Horn for mental health. Patient reports the following symptoms/concerns: The patient notes that she still has a lingering cough from COVID, but is managing better this week. She states that when she was first out her employer paid her time off. She stated that she went back to work for a day but had to take more time off because she became worse and her employer did not pay her for her time off. She stated that she is concerned about paying her rent and requested referrals to organizations that may help. The patient noted that her daughter is a support to her during this time, and feels bad that she is leaning on her The patient notes that she is planning on returning to work on 02/07/2021 and expects things to improve. She stated that she is sleeping well, but has an increased appetite. The patient denied any suicidal or homicidal thoughts.  Duration of problem:; Severity of problem: moderate  Objective: Mood: Euthymic and Affect: Appropriate Risk of harm to self or others: No plan to harm self or others  Life Context: Family and Social: see above School/Work: see above Self-Care: see above  Life Changes: see above  Patient and/or Family's Strengths/Protective Factors: Concrete supports in place (healthy food, safe environments, etc.)  Goals Addressed: Patient will: 1.  Reduce symptoms of: anxiety, depression and stress  2.  Increase knowledge and/or ability of: coping skills, healthy habits and stress reduction  3.  Demonstrate ability to: Increase healthy  adjustment to current life circumstances  Progress towards Goals: Ongoing  Interventions: Interventions utilized:  Supportive Counseling was utilized by the clinician during today's follow up session. The clinician processed with the patient how they have been doing since the last follow-up session. The clinician provided a space for the patient to ventilate their frustrations regarding their current life circumstances. Clinician measured the patient's anxiety and depression on a numerical scale. The clinician encouraged the patient to continue to utilize their coping skills to deal with their current life circumstances and to practice self care daily.  Standardized Assessments completed: GAD-7 and PHQ 9  Gad-7    12 PHQ-9    9   Assessment: Patient currently experiencing  See above  Patient may benefit from See above  Plan: 1. Follow up with behavioral health clinician on : 02/26/2021 at 10:00 AM 2. Behavioral recommendations:  3. Referral(s): Integrated [facility] [facility] (In Clinic) 4. "From scale of 1-10, how likely are you to follow plan?":   Judith Part, Student-Social Work

## 2021-02-11 ENCOUNTER — Other Ambulatory Visit: Payer: Self-pay | Admitting: Gerontology

## 2021-02-11 DIAGNOSIS — J309 Allergic rhinitis, unspecified: Secondary | ICD-10-CM

## 2021-02-11 DIAGNOSIS — E039 Hypothyroidism, unspecified: Secondary | ICD-10-CM

## 2021-02-12 ENCOUNTER — Other Ambulatory Visit: Payer: Self-pay | Admitting: Gerontology

## 2021-02-18 ENCOUNTER — Other Ambulatory Visit: Payer: Self-pay | Admitting: Gerontology

## 2021-02-18 DIAGNOSIS — R059 Cough, unspecified: Secondary | ICD-10-CM

## 2021-02-18 DIAGNOSIS — J309 Allergic rhinitis, unspecified: Secondary | ICD-10-CM

## 2021-02-19 ENCOUNTER — Other Ambulatory Visit: Payer: Self-pay | Admitting: Gerontology

## 2021-02-19 DIAGNOSIS — R059 Cough, unspecified: Secondary | ICD-10-CM

## 2021-02-19 DIAGNOSIS — J309 Allergic rhinitis, unspecified: Secondary | ICD-10-CM

## 2021-02-19 MED ORDER — CETIRIZINE HCL 10 MG PO TABS: 10.0000 mg | ORAL_TABLET | Freq: Every day | ORAL | 0 refills | Status: DC

## 2021-02-19 MED ORDER — BENZONATATE 100 MG PO CAPS: 200.0000 mg | ORAL_CAPSULE | Freq: Two times a day (BID) | ORAL | 0 refills | Status: DC

## 2021-02-26 ENCOUNTER — Ambulatory Visit: Payer: Medicaid Other | Admitting: Licensed Clinical Social Worker

## 2021-02-26 ENCOUNTER — Telehealth: Payer: Self-pay | Admitting: Licensed Clinical Social Worker

## 2021-02-26 NOTE — Telephone Encounter (Signed)
Called the patient twice during today's scheduled appointment; no answer, left a voicemail with the clinic contact information.

## 2021-03-06 ENCOUNTER — Other Ambulatory Visit: Payer: Self-pay

## 2021-03-06 ENCOUNTER — Other Ambulatory Visit: Payer: Medicaid Other

## 2021-03-06 ENCOUNTER — Ambulatory Visit: Payer: Self-pay | Admitting: Licensed Clinical Social Worker

## 2021-03-06 ENCOUNTER — Ambulatory Visit: Payer: Medicaid Other | Admitting: Licensed Clinical Social Worker

## 2021-03-06 DIAGNOSIS — F331 Major depressive disorder, recurrent, moderate: Secondary | ICD-10-CM

## 2021-03-06 DIAGNOSIS — F411 Generalized anxiety disorder: Secondary | ICD-10-CM

## 2021-03-06 NOTE — BH Specialist Note (Signed)
Integrated Behavioral Health Follow Up In-Person Visit  MRN: [id number] Name: Audrey Richard  Total time: 30 minutes  Types of Service: General Behavioral Integrated Care (BHI)  Interpretor:No. Interpretor Name and Language:   Patient consents to telephone visit and 2 patient identifiers were used to identify patient  Subjective: Audrey Richard is a 60 y.o. female accompanied by herself Patient was referred by Hurman Horn, NP for mental healthcare. Patient reports the following symptoms/concerns: The patient reports that she is getting her car inspected today. She stated that she does have medical insurance coverage through [facility] and requests referrals for a primary care provider and a mental health therapist. She shared that she is concerned that she will run out of her psychotropic medications before she can be seen elsewhere. The patient stated that everything else in her life is about the same. She did note that she is sleeping well and feels more rested. She discussed other financial and health stressors in her life. The patient denied suicidal or homicidal thoughts.  Duration of problem: ; Severity of problem: moderate  Objective: Mood: Euthymic and Affect: Appropriate Risk of harm to self or others: No plan to harm self or others  Life Context: Family and Social: see above School/Work: see above  Self-Care: see above Life Changes: see above  Patient and/or Family's Strengths/Protective Factors: Concrete supports in place (healthy food, safe environments, etc.)  Goals Addressed: Patient will: 1.  Reduce symptoms of: agitation, anxiety and depression  2.  Increase knowledge and/or ability of: coping skills, healthy habits and self-management skills  3.  Demonstrate ability to: Increase healthy adjustment to current life circumstances  Progress towards Goals: Ongoing  Interventions: Interventions utilized:  Supportive Counseling was utilized by  the clinician during today's follow up session. The clinician processed with the patient how they have been doing since the last follow-up session. The clinician provided a space for the patient to ventilate their frustrations regarding their current life circumstances. Clinician measured the patient's anxiety and depression on a numerical scale. Clinician explained to the patient that she will be given medication refills for a 90 day supply so that she will have time to establish care with another provider. Further, clinician informed the patient that she would check to verify her insurance and call her back with referral options.  Standardized Assessments completed: GAD-7 and PHQ 9  GAD-7    8 PHQ-9    8   Assessment: Patient currently experiencing see above  Patient may benefit from see above  Plan: 1. Follow up with behavioral health clinician on : 03/27/2021 at 11:00 AM  2. Behavioral recommendations:  3. Referral(s): Integrated [employer] (In Clinic) 4. "From scale of 1-10, how likely are you to follow plan?":   Judith Part, Student-Social Work

## 2021-03-11 ENCOUNTER — Telehealth: Payer: Self-pay | Admitting: Pharmacy Technician

## 2021-03-11 NOTE — Telephone Encounter (Signed)
Patient failed to provide 2022 proof of income.  No additional medication assistance will be provided by [facility] without the required proof of income documentation.  Patient notified by letter.  Sherilyn Dacosta Care Manager [facility]   [address] [place], [place]  [address]    March 11, 2021    Audrey Richard [address] [place], [place]  [address]  Dear Audrey Richard:  This is to inform you that you are no longer eligible to receive medication assistance at [facility].  The reason(s) are:    _____Your total gross monthly household income exceeds 250% of the Federal Poverty Level.   _____Tangible assets (savings, checking, stocks/bonds, pension, retirement, etc.) exceeds our limit  _____You are eligible to receive benefits from [facility], [facility] or HIV Medication              Assistance Program _____You are eligible to receive benefits from a Medicare Part "D" plan _____You have prescription insurance  _____You are not an [facility] resident __X__Failure to provide all requested proof of income information for 2022.    Medication assistance will resume once all requested financial information has been returned to our clinic.  If you have questions, please contact our clinic at [phone].    Thank you,  [facility]

## 2021-03-12 ENCOUNTER — Ambulatory Visit: Payer: Medicaid Other | Admitting: Gerontology

## 2021-03-14 ENCOUNTER — Ambulatory Visit: Payer: Medicaid Other | Admitting: Gerontology

## 2021-03-19 ENCOUNTER — Ambulatory Visit: Payer: Medicaid Other | Admitting: Gerontology

## 2021-03-26 ENCOUNTER — Other Ambulatory Visit: Payer: Self-pay | Admitting: Gerontology

## 2021-03-26 ENCOUNTER — Other Ambulatory Visit: Payer: Self-pay

## 2021-03-26 DIAGNOSIS — F331 Major depressive disorder, recurrent, moderate: Secondary | ICD-10-CM

## 2021-03-26 MED ORDER — FLUOXETINE HCL 20 MG PO TABS
20.0000 mg | ORAL_TABLET | Freq: Every day | ORAL | 2 refills | Status: DC
  Filled 2021-03-26: qty 30, 30d supply, fill #0

## 2021-03-26 MED ORDER — BUPROPION HCL ER (XL) 150 MG PO TB24
ORAL_TABLET | Freq: Every day | ORAL | 2 refills | Status: DC
  Filled 2021-03-26: qty 30, 30d supply, fill #0

## 2021-03-26 MED ORDER — TRAZODONE HCL 50 MG PO TABS
ORAL_TABLET | Freq: Every evening | ORAL | 2 refills | Status: DC | PRN
  Filled 2021-03-26: qty 30, 30d supply, fill #0

## 2021-03-27 ENCOUNTER — Telehealth: Payer: Self-pay | Admitting: Licensed Clinical Social Worker

## 2021-03-27 ENCOUNTER — Ambulatory Visit: Payer: Medicaid Other | Admitting: Licensed Clinical Social Worker

## 2021-03-27 ENCOUNTER — Other Ambulatory Visit: Payer: Self-pay

## 2021-03-27 NOTE — Telephone Encounter (Signed)
Called the patient twice during today's scheduled appointment; no answer, left a voicemail with the clinic contact information so they may reschedule.   

## 2021-03-28 ENCOUNTER — Other Ambulatory Visit: Payer: Self-pay

## 2021-03-29 ENCOUNTER — Other Ambulatory Visit: Payer: Self-pay

## 2021-04-12 ENCOUNTER — Encounter: Payer: Self-pay | Admitting: Nurse Practitioner

## 2021-04-12 DIAGNOSIS — N183 Chronic kidney disease, stage 3 unspecified: Secondary | ICD-10-CM | POA: Insufficient documentation

## 2021-04-17 ENCOUNTER — Ambulatory Visit: Payer: Medicaid Other | Admitting: Nurse Practitioner

## 2021-04-24 ENCOUNTER — Other Ambulatory Visit: Payer: Medicaid Other

## 2021-05-01 ENCOUNTER — Ambulatory Visit: Payer: Medicaid Other | Admitting: Gerontology

## 2021-05-02 ENCOUNTER — Other Ambulatory Visit: Payer: Self-pay

## 2021-05-10 ENCOUNTER — Other Ambulatory Visit: Payer: Self-pay

## 2021-05-14 ENCOUNTER — Other Ambulatory Visit: Payer: Self-pay

## 2021-05-14 ENCOUNTER — Encounter: Payer: Self-pay | Admitting: Nurse Practitioner

## 2021-05-14 ENCOUNTER — Ambulatory Visit (INDEPENDENT_AMBULATORY_CARE_PROVIDER_SITE_OTHER): Payer: BC Managed Care – PPO | Admitting: Nurse Practitioner

## 2021-05-14 VITALS — BP 126/79 | HR 79 | Temp 98.2°F | Wt 198.8 lb

## 2021-05-14 DIAGNOSIS — R5383 Other fatigue: Secondary | ICD-10-CM

## 2021-05-14 DIAGNOSIS — J410 Simple chronic bronchitis: Secondary | ICD-10-CM | POA: Diagnosis not present

## 2021-05-14 DIAGNOSIS — F5104 Psychophysiologic insomnia: Secondary | ICD-10-CM

## 2021-05-14 DIAGNOSIS — Z7689 Persons encountering health services in other specified circumstances: Secondary | ICD-10-CM | POA: Diagnosis not present

## 2021-05-14 DIAGNOSIS — N1831 Chronic kidney disease, stage 3a: Secondary | ICD-10-CM | POA: Diagnosis not present

## 2021-05-14 DIAGNOSIS — E559 Vitamin D deficiency, unspecified: Secondary | ICD-10-CM

## 2021-05-14 DIAGNOSIS — J301 Allergic rhinitis due to pollen: Secondary | ICD-10-CM

## 2021-05-14 DIAGNOSIS — R7309 Other abnormal glucose: Secondary | ICD-10-CM | POA: Insufficient documentation

## 2021-05-14 DIAGNOSIS — Z0000 Encounter for general adult medical examination without abnormal findings: Secondary | ICD-10-CM

## 2021-05-14 DIAGNOSIS — M5442 Lumbago with sciatica, left side: Secondary | ICD-10-CM

## 2021-05-14 DIAGNOSIS — M5441 Lumbago with sciatica, right side: Secondary | ICD-10-CM

## 2021-05-14 DIAGNOSIS — F331 Major depressive disorder, recurrent, moderate: Secondary | ICD-10-CM

## 2021-05-14 DIAGNOSIS — M150 Primary generalized (osteo)arthritis: Secondary | ICD-10-CM

## 2021-05-14 DIAGNOSIS — G8929 Other chronic pain: Secondary | ICD-10-CM

## 2021-05-14 DIAGNOSIS — E782 Mixed hyperlipidemia: Secondary | ICD-10-CM

## 2021-05-14 DIAGNOSIS — K219 Gastro-esophageal reflux disease without esophagitis: Secondary | ICD-10-CM

## 2021-05-14 DIAGNOSIS — E039 Hypothyroidism, unspecified: Secondary | ICD-10-CM

## 2021-05-14 DIAGNOSIS — E538 Deficiency of other specified B group vitamins: Secondary | ICD-10-CM

## 2021-05-14 DIAGNOSIS — M545 Low back pain, unspecified: Secondary | ICD-10-CM

## 2021-05-14 DIAGNOSIS — Z1211 Encounter for screening for malignant neoplasm of colon: Secondary | ICD-10-CM

## 2021-05-14 MED ORDER — FLUOXETINE HCL 20 MG PO TABS: 20.0000 mg | ORAL_TABLET | Freq: Every day | ORAL | 4 refills | Status: DC

## 2021-05-14 MED ORDER — GABAPENTIN 300 MG PO CAPS: ORAL_CAPSULE | Freq: Three times a day (TID) | ORAL | 4 refills | Status: DC

## 2021-05-14 MED ORDER — CYCLOBENZAPRINE HCL 10 MG PO TABS: 10.0000 mg | ORAL_TABLET | Freq: Every day | ORAL | 3 refills | Status: DC

## 2021-05-14 MED ORDER — TRAZODONE HCL 50 MG PO TABS: ORAL_TABLET | Freq: Every evening | ORAL | 2 refills | Status: DC | PRN

## 2021-05-14 MED ORDER — CETIRIZINE HCL 10 MG PO TABS: ORAL_TABLET | Freq: Every day | ORAL | 4 refills | Status: DC

## 2021-05-14 MED ORDER — BUPROPION HCL ER (XL) 150 MG PO TB24: ORAL_TABLET | Freq: Every day | ORAL | 4 refills | Status: DC

## 2021-05-14 MED ORDER — MONTELUKAST SODIUM 10 MG PO TABS
10.0000 mg | ORAL_TABLET | Freq: Every day | ORAL | 4 refills | Status: DC
Start: 2021-05-14 — End: 2021-07-12

## 2021-05-14 MED ORDER — OMEPRAZOLE 40 MG PO CPDR: DELAYED_RELEASE_CAPSULE | Freq: Every evening | ORAL | 4 refills | Status: DC

## 2021-05-14 MED ORDER — BUDESONIDE-FORMOTEROL FUMARATE 160-4.5 MCG/ACT IN AERO: 2.0000 | INHALATION_SPRAY | Freq: Two times a day (BID) | RESPIRATORY_TRACT | 6 refills | Status: DC

## 2021-05-14 MED ORDER — ROSUVASTATIN CALCIUM 20 MG PO TABS: ORAL_TABLET | Freq: Every day | ORAL | 4 refills | Status: DC

## 2021-05-14 NOTE — Assessment & Plan Note (Signed)
Ongoing -- recent TSH above goal.  Recheck TSH and Free T4 today + check CBC, CMP, ANA, ESR, CRP, A1c.  ?related to past Covid infection.  If ongoing could consider echo or sleep study to further evaluate.  Return in 4 weeks.

## 2021-05-14 NOTE — Assessment & Plan Note (Signed)
Noted on recent labs from November 2021, recheck CMP today and check A1c.  Urine ALB next visit, could consider addition of low dose ACE or ARB in future.

## 2021-05-14 NOTE — Assessment & Plan Note (Signed)
To back, feet, hands, and legs.  She reports benefit from Flexeril and Gabapentin, will send in refills and renal dose as needed.  Could consider repeat imaging if worsening.  Discussed with her possibly changing her Prozac to Duloxetine in future which may offer more benefit to both mood and pain level with her Gabapentin.  Check CRP, ESR, ANA today as there is family history of RA.

## 2021-05-14 NOTE — Assessment & Plan Note (Signed)
A1c check today due to past elevation on November 2021 labs.

## 2021-05-14 NOTE — Assessment & Plan Note (Signed)
Chronic, ongoing, reports good control with Prilosec.  Continue current medication regimen and adjust as needed.  Mag level next visit.

## 2021-05-14 NOTE — Patient Instructions (Signed)

## 2021-05-14 NOTE — Assessment & Plan Note (Signed)
Chronic, ongoing.  She reports benefit from Flexeril and Gabapentin, will send in refills and renal dose as needed.  Could consider repeat imaging if worsening.  Discussed with her possibly changing her Prozac to Duloxetine in future which may offer more benefit to both mood and pain level with her Gabapentin.  Check CRP, ESR, ANA today as there is family history of RA.

## 2021-05-14 NOTE — Assessment & Plan Note (Signed)
Chronic, ongoing.  Continue current medication regimen and adjust as needed based on labs.  TSH and Free T4 today, discussed with her possible dose change if ongoing TSH elevation noted.

## 2021-05-14 NOTE — Assessment & Plan Note (Signed)
Chronic, ongoing.  At this time will continue Trazodone as needed for sleep.  Recommend she trial Melatonin vs taking Trazodone -- she reports will try this.  Could consider sleep study in future if ongoing issue.  Return in 4 weeks.

## 2021-05-14 NOTE — Assessment & Plan Note (Signed)
Due for physical -- will have return in 4 weeks for this with pap.  At next visit provide tetanus vaccine and order mammogram.  Cologuard ordered today and dicussed with patient.

## 2021-05-14 NOTE — Progress Notes (Signed)
New Patient Office Visit  Subjective:  Patient ID: Audrey Richard, female    DOB: [birth year]  Age: 60 y.o. MRN: [id number]  CC:  Chief Complaint  Patient presents with  . Establish Care    Patient states was seeing Malachy Mood in the past and states her insurance cancelled and states now she has new insurance and a new job and she states she wants to re-establish care with Momoko Slezak. Patient would like to discuss about having testing done to see if she is diabetic. Patient states she is always in pain all over her body and her feet are always killing her and she states she stands on her feet all day at work.   . Medication Refill    Patient states she is requesting refills on all her medications.   . Fatigue    Patient states she would like to discuss with provider her being tired all the time and not having the energy to get and move around such as getting up and ready for work.   . Cough    Patient states she has been having issues with a lingering cough and tried medications and nothing seems to work and she wonders if she has Emphysema and states she is a former smokers.    HPI Audrey Richard presents for new patient visit to establish care.  Introduced to Designer, jewellery role and practice setting.  All questions answered.  Discussed provider/patient relationship and expectations.  Previous patient at [facility] and went to [facility] for a couple years due to no insurance.  HYPERLIPIDEMIA Continues on Rosuvastatin 20 MG, has been out for a few weeks.   Hyperlipidemia status: good compliance Satisfied with current treatment?  yes Side effects:  no Medication compliance: good compliance Supplements: none Aspirin:  no The 10-year ASCVD risk score Mikey Bussing DC Jr., et al., 2013) is: 3%   Values used to calculate the score:     Age: 71 years     Sex: Female     Is Non-Hispanic African American: No     Diabetic: No     Tobacco smoker: No     Systolic Blood  Pressure: 126 mmHg     Is BP treated: No     HDL Cholesterol: 61 mg/dL     Total Cholesterol: 232 mg/dL Chest pain:  no Coronary artery disease:  no Family history CAD:  yes Family history early CAD:  no   HYPOTHYROID Last TSH November 6.350 -- she endorses increase fatigue for months.  Reports hard to get up and move.  Current Levothyroxine dosing is 137 MCG.   Continues to take Omeprazole daily for GERD. Fatigue: yes Cold intolerance: yes Heat intolerance: no Weight gain: yes Weight loss: no Constipation: yes Diarrhea/loose stools: no Palpitations: no Lower extremity edema: sometimes Anxiety/depressed mood: yes  COPD Continues on Symbicort daily and Ventolin as needed -- has Duoneb but not using currently.  Has history of Covid in January 2022.  She is a past smoker -- quit 8-10 years ago, smoked for several years off and on.  Started smoking at age 10 or 47, smoked 1/2 to 1 PPD.   COPD status: stable Satisfied with current treatment?: yes Oxygen use: no Dyspnea frequency: daily Cough frequency: daily Rescue inhaler frequency: once daily Limitation of activity: no Productive cough: none Last Spirometry: 2018 -- had FEV1 68% and FEV1/FVC 89% == moderate Pneumovax: Up to Date in 2019 Influenza: Up to Date   CHRONIC  PAIN  Currently taking Flexeril and Gabapentin for pain.  Back pain and leg pain all the time, has known arthritis in the back.  Last imaging 11/23/2019 did noted mild degenerative changes.   Pain control status: stable Duration: chronic Location: lower back Quality: dull and aching Current Pain Level: 5/10 Previous Pain Level: 7/10 What Activities task can be accomplished with current medication? Work and keep house Previous pain specialty evaluation: no Non-narcotic analgesic meds: yes  DEPRESSION Currently taking Wellbutrin + Prozac, and Trazodone -- takes this for sleep. Mood status: stable Satisfied with current treatment?: yes Symptom severity:  moderate  Duration of current treatment : chronic Side effects: no Medication compliance: good compliance Psychotherapy/counseling: none Previous psychiatric medications: none Depressed mood: yes Anxious mood: no Anhedonia: no Significant weight loss or gain: no Insomnia: none Fatigue: yes Feelings of worthlessness or guilt: no Impaired concentration/indecisiveness: no Suicidal ideations: no Hopelessness: no Crying spells: no Depression screen [facility] 2/9 03/06/2021 02/05/2021 01/15/2021 11/21/2020 10/30/2020  Decreased Interest 0 1 0 2 0  Down, Depressed, Hopeless _0 PHQ - 2 Score _1 Altered sleeping 0 0 2 0 3  Tired, decreased energy _2 Change in appetite _3 Feeling bad or failure about yourself  0 2 0 0 1  Trouble concentrating 3 0 3 0 0  Moving slowly or fidgety/restless 0 0 0 0 0  Suicidal thoughts 0 0 0 0 0  PHQ-9 Score _4 Difficult doing work/chores Somewhat difficult - - - Somewhat difficult  Some recent data might be hidden    Past Medical History:  Diagnosis Date  . Asthma   . Depression   . High cholesterol   . Osteoporosis   . Thyroid disease     Past Surgical History:  Procedure Laterality Date  . TUBAL LIGATION      Family History  Problem Relation Age of Onset  . Heart disease Mother   . Heart disease Father   . Lung disease Father   . Diabetes Sister   . Diabetes Maternal Grandmother   . Heart disease Maternal Grandmother   . Diabetes Maternal Grandfather   . Heart disease Maternal Grandfather   . Diabetes Sister     Social History   Socioeconomic History  . Marital status: Divorced    Spouse name: Not on file  . Number of children: 3  . Years of education: Not on file  . Highest education level: Not on file  Occupational History  . Not on file  Tobacco Use  . Smoking status: Former Smoker    Quit date: 11/15/2005    Years since quitting: 15.5  . Smokeless tobacco: Never Used  Vaping Use  .  Vaping Use: Never used  Substance and Sexual Activity  . Alcohol use: Not Currently    Comment: pt states on occasion  . Drug use: No  . Sexual activity: Yes  Other Topics Concern  . Not on file  Social History Narrative   Social determinants completed. HS   Works at [employer] has 3 children.   Social Determinants of Health   Financial Resource Strain: Medium Risk  . Difficulty of Paying Living Expenses: Somewhat hard  Food Insecurity: Food Insecurity Present  . Worried About Charity fundraiser in the Last Year: Sometimes true  . Ran Out of Food in the Last Year: Never true  Transportation  Needs: No Transportation Needs  . Lack of Transportation (Medical): No  . Lack of Transportation (Non-Medical): No  Physical Activity: Inactive  . Days of Exercise per Week: 0 days  . Minutes of Exercise per Session: 0 min  Stress: Stress Concern Present  . Feeling of Stress : To some extent  Social Connections: Unknown  . Frequency of Communication with Friends and Family: More than three times a week  . Frequency of Social Gatherings with Friends and Family: More than three times a week  . Attends Religious Services: Never  . Active Member of Clubs or Organizations: No  . Attends Archivist Meetings: Never  . Marital Status: Patient refused  Intimate Partner Violence: Not on file    ROS Review of Systems  Constitutional: Negative for activity change, appetite change, diaphoresis, fatigue and fever.  Respiratory: Positive for cough (occasional), shortness of breath (occasional) and wheezing (occasional). Negative for chest tightness.   Cardiovascular: Negative for chest pain, palpitations and leg swelling.  Gastrointestinal: Positive for constipation. Negative for abdominal distention, abdominal pain, diarrhea, nausea and vomiting.  Endocrine: Positive for cold intolerance. Negative for heat intolerance, polydipsia, polyphagia and polyuria.  Musculoskeletal: Positive for  arthralgias.  Neurological: Negative.   Psychiatric/Behavioral: Positive for sleep disturbance. Negative for decreased concentration, self-injury and suicidal ideas. The patient is nervous/anxious.     Objective:   Today's Vitals: BP 126/79   Pulse 79   Temp 98.2 F (36.8 C) (Oral)   Wt 198 lb 12.8 oz (90.2 kg)   LMP  (LMP Unknown)   SpO2 97%   BMI 31.14 kg/m   Physical Exam Vitals and nursing note reviewed.  Constitutional:      General: She is awake. She is not in acute distress.    Appearance: She is well-developed and well-groomed. She is obese. She is not ill-appearing or toxic-appearing.  HENT:     Head: Normocephalic.     Right Ear: Hearing normal.     Left Ear: Hearing normal.  Eyes:     General: Lids are normal.        Right eye: No discharge.        Left eye: No discharge.     Conjunctiva/sclera: Conjunctivae normal.     Pupils: Pupils are equal, round, and reactive to light.  Neck:     Thyroid: No thyromegaly.     Vascular: No carotid bruit.  Cardiovascular:     Rate and Rhythm: Normal rate and regular rhythm.     Heart sounds: Normal heart sounds. No murmur heard. No gallop.   Pulmonary:     Effort: Pulmonary effort is normal. No accessory muscle usage or respiratory distress.     Breath sounds: Normal breath sounds. No decreased breath sounds, wheezing or rhonchi.  Abdominal:     General: Bowel sounds are normal.     Palpations: Abdomen is soft.  Musculoskeletal:     Cervical back: Normal range of motion and neck supple.     Right lower leg: No edema.     Left lower leg: No edema.  Lymphadenopathy:     Cervical: No cervical adenopathy.  Skin:    General: Skin is warm and dry.  Neurological:     Mental Status: She is alert and oriented to person, place, and time.     Deep Tendon Reflexes: Reflexes are normal and symmetric.     Reflex Scores:      Brachioradialis reflexes are 2+ on the right side and 2+  on the left side.      Patellar reflexes are  2+ on the right side and 2+ on the left side. Psychiatric:        Attention and Perception: Attention normal.        Mood and Affect: Mood normal.        Speech: Speech normal.        Behavior: Behavior normal. Behavior is cooperative.        Thought Content: Thought content normal.     Assessment & Plan:   Problem List Items Addressed This Visit      Respiratory   COPD (chronic obstructive pulmonary disease) (HCC)    Chronic, ongoing, on review past CXR in past bronchitic changes noted -- reviewed this with patient and educated her on COPD, do suspect some underlying emphysema due to past smoking.  May benefit from lung CT in future if poor symptoms control, may qualify for lung CA CT screening, will discuss further with her next visit.  Continue current inhaler regimen.  CCM referral placed to discuss costs.  Plan on spirometry next visit.  Labs today: CBC and alpha 1-antitrypsin.  Return in 4 weeks.      Relevant Medications   montelukast (SINGULAIR) 10 MG tablet   budesonide-formoterol (SYMBICORT) 160-4.5 MCG/ACT inhaler   cetirizine (ZYRTEC) 10 MG tablet   Other Relevant Orders   CBC with Differential/Platelet   Alpha-1-antitrypsin   AMB Referral to [facility] Coordinaton   Allergic rhinitis    Chronic, ongoing issue with underlying COPD.  At this time taking Zytrec and uses Flonase with minimal benefit.  Discussed with patient, will add on Singulair nightly, educated on this medication and use + possible side effects to alert provider of.  Labs today: CBC and CMP.  Return in 4 weeks for physical.      Relevant Medications   cetirizine (ZYRTEC) 10 MG tablet     Digestive   Acid reflux    Chronic, ongoing, reports good control with Prilosec.  Continue current medication regimen and adjust as needed.  Mag level next visit.      Relevant Medications   omeprazole (PRILOSEC) 40 MG capsule     Endocrine   Hypothyroidism    Chronic, ongoing.  Continue current medication  regimen and adjust as needed based on labs.  TSH and Free T4 today, discussed with her possible dose change if ongoing TSH elevation noted.       Relevant Orders   TSH   T4, free   AMB Referral to [facility] Coordinaton     Musculoskeletal and Integument   Primary generalized (osteo)arthritis    To back, feet, hands, and legs.  She reports benefit from Flexeril and Gabapentin, will send in refills and renal dose as needed.  Could consider repeat imaging if worsening.  Discussed with her possibly changing her Prozac to Duloxetine in future which may offer more benefit to both mood and pain level with her Gabapentin.  Check CRP, ESR, ANA today as there is family history of RA.        Relevant Medications   cyclobenzaprine (FLEXERIL) 10 MG tablet     Genitourinary   CKD (chronic kidney disease) stage 3, GFR 30-59 ml/min (HCC)    Noted on recent labs from November 2021, recheck CMP today and check A1c.  Urine ALB next visit, could consider addition of low dose ACE or ARB in future.      Relevant Orders   Comprehensive metabolic panel  AMB Referral to [facility]     Other   Depression    Chronic, ongoing.  Denies SI/HI.  At this time continue Wellbutrin and Prozac + Trazodone as needed for sleep.  Could consider in future discontinuation of Prozac and change to Duloxetine which may also benefit her chronic pain, along with mood.  Refills sent in today.  Return in 4 weeks.      Relevant Medications   buPROPion (WELLBUTRIN XL) 150 MG 24 hr tablet   FLUoxetine (PROZAC) 20 MG tablet   traZODone (DESYREL) 50 MG tablet   Insomnia    Chronic, ongoing.  At this time will continue Trazodone as needed for sleep.  Recommend she trial Melatonin vs taking Trazodone -- she reports will try this.  Could consider sleep study in future if ongoing issue.  Return in 4 weeks.      Hyperlipidemia    Chronic, ongoing.  Continue current medication regimen and adjust as needed.  Lipid  panel today.      Relevant Medications   rosuvastatin (CRESTOR) 20 MG tablet   Other Relevant Orders   Lipid Panel w/o Chol/HDL Ratio   AMB Referral to [place] care maintenance    Due for physical -- will have return in 4 weeks for this with pap.  At next visit provide tetanus vaccine and order mammogram.  Cologuard ordered today and dicussed with patient.      Chronic back pain    Chronic, ongoing.  She reports benefit from Flexeril and Gabapentin, will send in refills and renal dose as needed.  Could consider repeat imaging if worsening.  Discussed with her possibly changing her Prozac to Duloxetine in future which may offer more benefit to both mood and pain level with her Gabapentin.  Check CRP, ESR, ANA today as there is family history of RA.        Relevant Medications   buPROPion (WELLBUTRIN XL) 150 MG 24 hr tablet   cyclobenzaprine (FLEXERIL) 10 MG tablet   FLUoxetine (PROZAC) 20 MG tablet   gabapentin (NEURONTIN) 300 MG capsule   traZODone (DESYREL) 50 MG tablet   Other Relevant Orders   C-reactive protein   Sed Rate (ESR)   ANA w/Reflex if Positive   Fatigue    Ongoing -- recent TSH above goal.  Recheck TSH and Free T4 today + check CBC, CMP, ANA, ESR, CRP, A1c.  ?related to past Covid infection.  If ongoing could consider echo or sleep study to further evaluate.  Return in 4 weeks.      Elevated hemoglobin A1c    A1c check today due to past elevation on November 2021 labs.      Relevant Orders   Hemoglobin A1c    Other Visit Diagnoses    Encounter to establish care    -  Primary   Vitamin D deficiency       History of low levels, recheck today and initiate supplement as needed.   Relevant Orders   VITAMIN D 25 Hydroxy (Vit-D Deficiency, Fractures)   Vitamin B12 deficiency       History of low levels, recheck today and initiate supplement as needed.   Relevant Orders   Vitamin B12   Colon cancer screening       Cologuard ordered  today   Relevant Orders   Cologuard      Outpatient Encounter Medications as of 05/14/2021  Medication Sig  . albuterol (VENTOLIN HFA) 108 (90 Base)  MCG/ACT inhaler Inhale 2 puffs into the lungs every 6 (six) hours as needed for wheezing or shortness of breath.  Marland Kitchen azelastine (OPTIVAR) 0.05 % ophthalmic solution Place 1 drop into both eyes 2 (two) times daily.  . fluticasone (FLONASE) 50 MCG/ACT nasal spray PLACE 2 SPRAYS INTO BOTH NOSTRILS EVERY DAY  . ipratropium-albuterol (DUONEB) 0.5-2.5 (3) MG/3ML SOLN Take 3 mLs by nebulization every 6 (six) hours as needed.  Marland Kitchen levothyroxine (SYNTHROID) 137 MCG tablet TAKE ONE TABLET BY MOUTH EVERY DAY  . montelukast (SINGULAIR) 10 MG tablet Take 1 tablet (10 mg total) by mouth at bedtime.  . [DISCONTINUED] budesonide-formoterol (SYMBICORT) 160-4.5 MCG/ACT inhaler Inhale 2 puffs into the lungs 2 (two) times daily.  . [DISCONTINUED] buPROPion (WELLBUTRIN XL) 150 MG 24 hr tablet TAKE ONE TABLET BY MOUTH EVERY DAY  . [DISCONTINUED] cetirizine (ZYRTEC) 10 MG tablet TAKE ONE TABLET BY MOUTH EVERY DAY  . [DISCONTINUED] cyclobenzaprine (FLEXERIL) 10 MG tablet Take 1 tablet (10 mg total) by mouth at bedtime. (Patient taking differently: Take 10 mg by mouth daily as needed for muscle spasms.)  . [DISCONTINUED] FLUoxetine (PROZAC) 20 MG tablet Take 1 tablet (20 mg total) by mouth daily.  . [DISCONTINUED] gabapentin (NEURONTIN) 300 MG capsule TAKE ONE CAPSULE BY MOUTH 3 TIMES A DAY  . [DISCONTINUED] omeprazole (PRILOSEC) 40 MG capsule TAKE ONE CAPSULE BY MOUTH EVERY EVENING  . [DISCONTINUED] rosuvastatin (CRESTOR) 20 MG tablet TAKE ONE TABLET BY MOUTH EVERY DAY  . [DISCONTINUED] traZODone (DESYREL) 50 MG tablet TAKE ONE TABLET BY MOUTH AT BEDTIME AS NEEDED  . budesonide-formoterol (SYMBICORT) 160-4.5 MCG/ACT inhaler Inhale 2 puffs into the lungs 2 (two) times daily.  Marland Kitchen buPROPion (WELLBUTRIN XL) 150 MG 24 hr tablet TAKE ONE TABLET BY MOUTH EVERY DAY  . cetirizine  (ZYRTEC) 10 MG tablet TAKE ONE TABLET BY MOUTH EVERY DAY  . cyclobenzaprine (FLEXERIL) 10 MG tablet Take 1 tablet (10 mg total) by mouth at bedtime.  Marland Kitchen FLUoxetine (PROZAC) 20 MG tablet Take 1 tablet (20 mg total) by mouth daily.  Marland Kitchen gabapentin (NEURONTIN) 300 MG capsule TAKE ONE CAPSULE BY MOUTH 3 TIMES A DAY  . omeprazole (PRILOSEC) 40 MG capsule TAKE ONE CAPSULE BY MOUTH EVERY EVENING  . rosuvastatin (CRESTOR) 20 MG tablet TAKE ONE TABLET BY MOUTH EVERY DAY  . traZODone (DESYREL) 50 MG tablet TAKE ONE TABLET BY MOUTH AT BEDTIME AS NEEDED  . [DISCONTINUED] Ascorbic Acid (VITAMIN C) 100 MG tablet Take 1 tablet (100 mg total) by mouth daily. (Patient not taking: Reported on 05/14/2021)  . [DISCONTINUED] benzonatate (TESSALON PERLES) 100 MG capsule Take 2 capsules (200 mg total) by mouth 2 (two) times daily. (Patient not taking: Reported on 05/14/2021)  . [DISCONTINUED] benzonatate (TESSALON) 200 MG capsule TAKE 1 CAPSULE BY MOUTH 2 TIMES A DAY (Patient not taking: Reported on 05/14/2021)  . [DISCONTINUED] doxycycline (VIBRAMYCIN) 100 MG capsule TAKE 1 CAPSULE BY MOUTH 2 TIMES A DAY FOR 10 DAYS (Patient not taking: Reported on 05/14/2021)  . [DISCONTINUED] guaiFENesin (MUCINEX) 600 MG 12 hr tablet Take 1 tablet (600 mg total) by mouth 2 (two) times daily. (Patient not taking: Reported on 05/14/2021)  . [DISCONTINUED] predniSONE (DELTASONE) 20 MG tablet TAKE ONE TABLET BY MOUTH EVERY DAY WITH BREAKFAST (Patient not taking: Reported on 05/14/2021)  . [DISCONTINUED] SYMBICORT 160-4.5 MCG/ACT inhaler INHALE 2 PUFFS INTO THE LUNGS 2 TIMES A DAY (Patient not taking: Reported on 05/14/2021)   No facility-administered encounter medications on file as of 05/14/2021.    Follow-up:  Return in about 4 weeks (around 06/11/2021) for Annual physical with spirometry and pap.   Venita Lick, NP

## 2021-05-14 NOTE — Assessment & Plan Note (Signed)
Chronic, ongoing.  Denies SI/HI.  At this time continue Wellbutrin and Prozac + Trazodone as needed for sleep.  Could consider in future discontinuation of Prozac and change to Duloxetine which may also benefit her chronic pain, along with mood.  Refills sent in today.  Return in 4 weeks.

## 2021-05-14 NOTE — Assessment & Plan Note (Signed)
Chronic, ongoing issue with underlying COPD.  At this time taking Zytrec and uses Flonase with minimal benefit.  Discussed with patient, will add on Singulair nightly, educated on this medication and use + possible side effects to alert provider of.  Labs today: CBC and CMP.  Return in 4 weeks for physical.

## 2021-05-14 NOTE — Assessment & Plan Note (Addendum)
Chronic, ongoing, on review past CXR in past bronchitic changes noted -- reviewed this with patient and educated her on COPD, do suspect some underlying emphysema due to past smoking.  May benefit from lung CT in future if poor symptoms control, may qualify for lung CA CT screening, will discuss further with her next visit.  Continue current inhaler regimen.  CCM referral placed to discuss costs.  Plan on spirometry next visit.  Labs today: CBC and alpha 1-antitrypsin.  Return in 4 weeks.

## 2021-05-14 NOTE — Assessment & Plan Note (Signed)
Chronic, ongoing.  Continue current medication regimen and adjust as needed. Lipid panel today. 

## 2021-05-15 LAB — CBC WITH DIFFERENTIAL/PLATELET
Basophils Absolute: 0 10*3/uL (ref 0.0–0.2)
Basos: 0 %
EOS (ABSOLUTE): 0.1 10*3/uL (ref 0.0–0.4)
Eos: 2 %
Hematocrit: 39.9 % (ref 34.0–46.6)
Hemoglobin: 13.3 g/dL (ref 11.1–15.9)
Immature Grans (Abs): 0 10*3/uL (ref 0.0–0.1)
Immature Granulocytes: 1 %
Lymphocytes Absolute: 0.9 10*3/uL (ref 0.7–3.1)
Lymphs: 17 %
MCH: 31.4 pg (ref 26.6–33.0)
MCHC: 33.3 g/dL (ref 31.5–35.7)
MCV: 94 fL (ref 79–97)
Monocytes Absolute: 0.3 10*3/uL (ref 0.1–0.9)
Monocytes: 6 %
Neutrophils Absolute: 3.8 10*3/uL (ref 1.4–7.0)
Neutrophils: 74 %
Platelets: 230 10*3/uL (ref 150–450)
RBC: 4.23 x10E6/uL (ref 3.77–5.28)
RDW: 12.3 % (ref 11.7–15.4)
WBC: 5.2 10*3/uL (ref 3.4–10.8)

## 2021-05-15 LAB — COMPREHENSIVE METABOLIC PANEL
ALT: 22 IU/L (ref 0–32)
AST: 17 IU/L (ref 0–40)
Albumin/Globulin Ratio: 2.3 — ABNORMAL HIGH (ref 1.2–2.2)
Albumin: 4.4 g/dL (ref 3.8–4.9)
Alkaline Phosphatase: 50 IU/L (ref 44–121)
BUN/Creatinine Ratio: 16 (ref 9–23)
BUN: 16 mg/dL (ref 6–24)
Bilirubin Total: 0.5 mg/dL (ref 0.0–1.2)
CO2: 22 mmol/L (ref 20–29)
Calcium: 9.1 mg/dL (ref 8.7–10.2)
Chloride: 105 mmol/L (ref 96–106)
Creatinine, Ser: 0.97 mg/dL (ref 0.57–1.00)
Globulin, Total: 1.9 g/dL (ref 1.5–4.5)
Glucose: 100 mg/dL — ABNORMAL HIGH (ref 65–99)
Potassium: 4.1 mmol/L (ref 3.5–5.2)
Sodium: 142 mmol/L (ref 134–144)
Total Protein: 6.3 g/dL (ref 6.0–8.5)
eGFR: 67 mL/min/{1.73_m2} (ref >59)

## 2021-05-15 LAB — LIPID PANEL W/O CHOL/HDL RATIO
Cholesterol, Total: 167 mg/dL (ref 100–199)
HDL: 68 mg/dL (ref >39)
LDL Chol Calc (NIH): 78 mg/dL (ref 0–99)
Triglycerides: 120 mg/dL (ref 0–149)
VLDL Cholesterol Cal: 21 mg/dL (ref 5–40)

## 2021-05-15 LAB — ANA W/REFLEX IF POSITIVE: Anti Nuclear Antibody (ANA): NEGATIVE

## 2021-05-15 LAB — TSH: TSH: 0.364 u[IU]/mL — ABNORMAL LOW (ref 0.450–4.500)

## 2021-05-15 LAB — HEMOGLOBIN A1C
Est. average glucose Bld gHb Est-mCnc: 114 mg/dL
Hgb A1c MFr Bld: 5.6 % (ref 4.8–5.6)

## 2021-05-15 LAB — T4, FREE: Free T4: 1.66 ng/dL (ref 0.82–1.77)

## 2021-05-15 LAB — VITAMIN D 25 HYDROXY (VIT D DEFICIENCY, FRACTURES): Vit D, 25-Hydroxy: 15.5 ng/mL — ABNORMAL LOW (ref 30.0–100.0)

## 2021-05-15 LAB — SEDIMENTATION RATE: Sed Rate: 8 mm/hr (ref 0–40)

## 2021-05-15 LAB — C-REACTIVE PROTEIN: CRP: <1 mg/L (ref 0–10)

## 2021-05-15 LAB — ALPHA-1-ANTITRYPSIN: A-1 Antitrypsin: 123 mg/dL (ref 101–187)

## 2021-05-15 LAB — VITAMIN B12: Vitamin B-12: 419 pg/mL (ref 232–1245)

## 2021-05-15 MED ORDER — LEVOTHYROXINE SODIUM 125 MCG PO TABS: 125.0000 ug | ORAL_TABLET | Freq: Every day | ORAL | 4 refills | Status: DC

## 2021-05-15 MED ORDER — VITAMIN B-12 500 MCG PO TABS: 500.0000 ug | ORAL_TABLET | Freq: Every day | ORAL | 4 refills | Status: DC

## 2021-05-15 MED ORDER — CHOLECALCIFEROL 1.25 MG (50000 UT) PO TABS: 1.0000 | ORAL_TABLET | ORAL | 2 refills | Status: DC

## 2021-05-15 NOTE — Addendum Note (Signed)
Addended by: Marjie Skiff on: 05/15/2021 06:53 PM   Modules accepted: Orders

## 2021-05-15 NOTE — Progress Notes (Signed)
Contacted via MyChart   Good evening Audrey Richard, your labs have returned: - CRP, ESR, ANA are all normal or negative -- showing no autoimmune arthritis or rheumatoid, suspect this is more osteoarthritic changes of joints - Cholesterol levels look fantastic -- continue Rosuvastatin as ordered - CBC shows no anemia - Kidney function, creatinine and eGFR, and liver function, AST and ALT are normal - Alpha-1 antitrypsin is normal -- meaning this is not underlying cause of your lung disease, most likely more emphysema - A1c shows no prediabetes or diabetes -- it is 5.6% - Vitamin B12 is on lower side of normal, I would recommend starting Vitamin B12 500 MCG daily, which you can get over the counter - Vitamin D is on low side, I am going to send in a supplement for this to take weekly - Thyroid testing this check shows it is more hyperthyroid, we have you too low on TSH.  I will send in a lower dose of your Levothyroxine for you to start taking.  Stop the 137 MCG.  Any questions? Keep being awesome!!  Thank you for allowing me to participate in your care.  I appreciate you. Kindest regards, Amond Speranza

## 2021-05-15 NOTE — Addendum Note (Signed)
Addended by: Aura Dials T on: 05/15/2021 07:10 PM   Modules accepted: Orders

## 2021-05-29 ENCOUNTER — Telehealth: Payer: Self-pay | Admitting: *Deleted

## 2021-05-29 NOTE — Telephone Encounter (Signed)
   Telephone encounter was:  Unsuccessful.  05/29/2021 Name: Audrey Richard MRN: [id number] DOB: 12/20/1961  Unsuccessful outbound call made today to assist with:  Home Modifications  Outreach Attempt:  1st Attempt  A HIPAA compliant voice message was left requesting a return call.  Instructed patient to call back at   Instructed patient to call back at [phone]  at their earliest convenience.   [name] -[facility] Guide , Embedded Care Coordination [facility], Care Management  [phone] [address] [place] , [place] [address] Email : [name]

## 2021-06-05 ENCOUNTER — Telehealth: Payer: Self-pay | Admitting: *Deleted

## 2021-06-05 NOTE — Telephone Encounter (Signed)
   Telephone encounter was:  Unsuccessful.  06/05/2021 Name: Audrey Richard MRN: [id number] DOB: [birth year]  Unsuccessful outbound call made today to assist with:   insurance  Outreach Attempt:  3rd Attempt.  Referral closed unable to contact patient.  Unable  to reach  [name] -[facility] Guide , Embedded Care Coordination [facility], Care Management  [phone] [address] [place] , [place] [address] Email : [name]

## 2021-06-12 LAB — COLOGUARD: Cologuard: NEGATIVE

## 2021-06-15 ENCOUNTER — Encounter: Payer: Self-pay | Admitting: Nurse Practitioner

## 2021-06-15 DIAGNOSIS — E559 Vitamin D deficiency, unspecified: Secondary | ICD-10-CM | POA: Insufficient documentation

## 2021-06-18 ENCOUNTER — Encounter: Payer: Self-pay | Admitting: Nurse Practitioner

## 2021-06-18 ENCOUNTER — Other Ambulatory Visit (HOSPITAL_COMMUNITY)
Admission: RE | Admit: 2021-06-18 | Discharge: 2021-06-18 | Disposition: A | Discharge status: Home / Self Care | Payer: Medicaid Other | Source: Ambulatory Visit | Attending: Nurse Practitioner | Admitting: Nurse Practitioner

## 2021-06-18 ENCOUNTER — Other Ambulatory Visit: Payer: Self-pay

## 2021-06-18 ENCOUNTER — Ambulatory Visit (INDEPENDENT_AMBULATORY_CARE_PROVIDER_SITE_OTHER): Payer: BC Managed Care – PPO | Admitting: Nurse Practitioner

## 2021-06-18 VITALS — BP 100/67 | HR 88 | Temp 98.4°F | Wt 199.2 lb

## 2021-06-18 DIAGNOSIS — E039 Hypothyroidism, unspecified: Secondary | ICD-10-CM | POA: Diagnosis not present

## 2021-06-18 DIAGNOSIS — E782 Mixed hyperlipidemia: Secondary | ICD-10-CM

## 2021-06-18 DIAGNOSIS — M5441 Lumbago with sciatica, right side: Secondary | ICD-10-CM

## 2021-06-18 DIAGNOSIS — Z0000 Encounter for general adult medical examination without abnormal findings: Secondary | ICD-10-CM

## 2021-06-18 DIAGNOSIS — G8929 Other chronic pain: Secondary | ICD-10-CM

## 2021-06-18 DIAGNOSIS — J449 Chronic obstructive pulmonary disease, unspecified: Secondary | ICD-10-CM | POA: Diagnosis not present

## 2021-06-18 DIAGNOSIS — F324 Major depressive disorder, single episode, in partial remission: Secondary | ICD-10-CM

## 2021-06-18 DIAGNOSIS — Z1231 Encounter for screening mammogram for malignant neoplasm of breast: Secondary | ICD-10-CM | POA: Diagnosis not present

## 2021-06-18 DIAGNOSIS — M5442 Lumbago with sciatica, left side: Secondary | ICD-10-CM

## 2021-06-18 DIAGNOSIS — Z124 Encounter for screening for malignant neoplasm of cervix: Secondary | ICD-10-CM | POA: Insufficient documentation

## 2021-06-18 DIAGNOSIS — E538 Deficiency of other specified B group vitamins: Secondary | ICD-10-CM | POA: Insufficient documentation

## 2021-06-18 MED ORDER — BENZONATATE 100 MG PO CAPS: 100.0000 mg | ORAL_CAPSULE | Freq: Three times a day (TID) | ORAL | 0 refills | Status: DC | PRN

## 2021-06-18 MED ORDER — BREZTRI AEROSPHERE 160-9-4.8 MCG/ACT IN AERO: 2.0000 | INHALATION_SPRAY | Freq: Two times a day (BID) | RESPIRATORY_TRACT | 11 refills | Status: DC

## 2021-06-18 MED ORDER — IPRATROPIUM-ALBUTEROL 0.5-2.5 (3) MG/3ML IN SOLN: 3.0000 mL | Freq: Once | RESPIRATORY_TRACT | Status: DC

## 2021-06-18 MED ORDER — PREDNISONE 20 MG PO TABS: 40.0000 mg | ORAL_TABLET | Freq: Every day | ORAL | 0 refills | Status: AC

## 2021-06-18 MED ORDER — AMOXICILLIN-POT CLAVULANATE 875-125 MG PO TABS: 1.0000 | ORAL_TABLET | Freq: Two times a day (BID) | ORAL | 0 refills | Status: AC

## 2021-06-18 NOTE — Assessment & Plan Note (Signed)
Refuses tetanus. Shingrix, and PNA vaccines. Mammogram ordered.  Cologuard ordered last visit and she sent in last week.  Pap obtained today.

## 2021-06-18 NOTE — Assessment & Plan Note (Signed)
Chronic, ongoing.  Continue current medication regimen and adjust as needed based on labs.  Recent adjustment 4 weeks ago, plan for recheck labs next visit in 6 weeks.

## 2021-06-18 NOTE — Patient Instructions (Signed)
[facility]  Address: [address], [place], [place] [address]  Phone: [phone]  Mammogram A mammogram is an X-ray of the breasts. This is done to check for changes that are not normal. This test can look for changes that may be caused by breast cancer or other problems. Mammograms are regularly done on women beginning at age 60. A man may have a mammogram if he has a lump or swelling in his breast. Tell a doctor: About any allergies you have. If you have breast implants. If you have had breast disease, biopsy, or surgery. If you have a family history of breast cancer. If you are breastfeeding. Whether you are pregnant or may be pregnant. What are the risks? Generally, this is a safe procedure. But problems may occur, including: Being exposed to radiation. Radiation levels are very low with this test. The need for more tests. The results were not read properly. Trouble finding breast cancer in women with dense breasts. What happens before the test? Have this test done about 1-2 weeks after your menstrual period. This is often when your breasts are the least tender. If you are visiting a new doctor or clinic, have any past mammogram images sent to your new doctor's office. Wash your breasts and under your arms on the day of the test. Do not use deodorants, perfumes, lotions, or powders on the day of the test. Take off any jewelry from your neck. Wear clothes that you can change into and out of easily. What happens during the test?  You will take off your clothes from the waist up. You will put on a gown. You will stand in front of the X-ray machine. Each breast will be placed between two plastic or glass plates. The plates will press down on your breast for a few seconds. Try to relax. This does not cause any harm to your breasts. It may not feel comfortable, but it will be very brief. X-rays will be taken from different angles of each  breast. The procedure may vary among doctors and hospitals. What can I expect after the test? The mammogram will be read by a specialist (radiologist). You may need to do parts of the test again. This depends on the quality of the images. You may go back to your normal activities. It is up to you to get the results of your test. Ask how to get your results when they are ready. Summary A mammogram is an X-ray of the breasts. It looks for changes that may be caused by breast cancer or other problems. A man may have this test if he has a lump or swelling in his breast. Before the test, tell your doctor about any breast problems that you have had in the past. Have this test done about 1-2 weeks after your menstrual period. Ask when your test results will be ready. Make sure you get your test results. This information is not intended to replace advice given to you by your health care provider. Make sure you discuss any questions you have with your health care provider. Document Revised: 10/08/2020 Document Reviewed: 10/08/2020 Elsevier Patient Education  2022 [employer]   

## 2021-06-18 NOTE — Assessment & Plan Note (Signed)
Chronic, ongoing.  Denies SI/HI.  At this time continue Wellbutrin and Prozac + Trazodone as needed for sleep.  Could consider in future discontinuation of Prozac and change to Duloxetine which may also benefit her chronic pain, along with mood.  Return in 6 months for mood.

## 2021-06-18 NOTE — Assessment & Plan Note (Signed)
Chronic, ongoing, on review CXR in past bronchitic changes noted -- reviewed this with patient and educated her on COPD, do suspect some underlying emphysema due to past smoking.  May benefit from lung CT in future if poor symptoms control, may qualify for lung CA CT screening, will discuss further with her next visit.  Currently FEV1 43% and FEV1/FVC 91%, some decline since past check -- although suspect mild exacerbation present at this time. Will send in Augmentin, Prednisone burst, and Tessalon.  Stop Symbicort and start Markus Daft, educated her on this and provided her sample to start in office.  Continue Ventolin as needed.  [facility] referral placed last visit to discuss costs, but does not qualify.  Return in 6 weeks.

## 2021-06-18 NOTE — Progress Notes (Signed)
BP 100/67   Pulse 88   Temp 98.4 F (36.9 C) (Oral)   Wt 199 lb 3.2 oz (90.4 kg)   LMP  (LMP Unknown)   SpO2 95%   BMI 31.20 kg/m    Subjective:    Patient ID: Audrey Richard, female    DOB: 1961/08/09, 60 y.o.   MRN: [id number]  HPI: Audrey Richard is a 60 y.o. female presenting on 06/18/2021 for comprehensive medical examination. Current medical complaints include:none  She currently lives with: daughter Menopausal Symptoms: no  HYPERLIPIDEMIA Continues on Rosuvastatin 20 MG. Hyperlipidemia status: good compliance Satisfied with current treatment?  yes Side effects:  no Medication compliance: good compliance Supplements: none Aspirin:  no The 10-year ASCVD risk score Audrey Richard DC Jr., et al., 2013) is: 1.4%   Values used to calculate the score:     Age: 21 years     Sex: Female     Is Non-Hispanic African American: No     Diabetic: No     Tobacco smoker: No     Systolic Blood Pressure: 073 mmHg     Is BP treated: No     HDL Cholesterol: 68 mg/dL     Total Cholesterol: 167 mg/dL Chest pain:  no Coronary artery disease:  no Family history CAD:  yes Family history early CAD:  no    HYPOTHYROID Last TSH May was 0.364, we reduced her Levothyroxine to 125 MCG.     Continues to take Omeprazole daily for GERD. Fatigue: yes Cold intolerance: yes Heat intolerance: no Weight gain: yes Weight loss: no Constipation: yes Diarrhea/loose stools: no Palpitations: no Lower extremity edema: sometimes Anxiety/depressed mood: yes   COPD Continues on Symbicort daily and Ventolin as needed, added on Singulair last visit for allergy type flares  Has history of Covid in January 2022.  She is a past smoker -- quit 8-10 years ago, smoked for several years off and on.  Started smoking at age 40 or 23, smoked 1/2 to 1 PPD.  Alpha 1 negative recent labs.  Reports increase cough over past couple weeks with some productive cough -- feels like exacerbation, using Ventolin more.    COPD status: stable Satisfied with current treatment?: yes Oxygen use: no Dyspnea frequency: daily Cough frequency: daily Rescue inhaler frequency: once daily Limitation of activity: no Productive cough: none Last Spirometry: 2018 -- had FEV1 68% and FEV1/FVC 89% == moderate and then today FEV1 43% and FEV1/FVC 91% Pneumovax: Up to Date in 2019 Influenza: Up to Date    CHRONIC PAIN  Currently taking Flexeril and Gabapentin for pain.  Recent labs noted negative ANA and normal ESR and CRP.  Last imaging 11/23/2019 did noted mild degenerative changes.    She was noted to have a low Vitamin D level on recent labs, at 15.5 and started on weekly Vitamin D.  B12 level was 419, started low dose supplement. Pain control status: stable Duration: chronic Location: lower back Quality: dull and aching Current Pain Level: 5/10 Previous Pain Level: 7/10 What Activities task can be accomplished with current medication? Work and maintaining her house Previous pain specialty evaluation: no Non-narcotic analgesic meds: yes   DEPRESSION Currently taking Wellbutrin + Prozac, and Trazodone, which she takes as a sleep aide. Mood status: stable Satisfied with current treatment?: yes Symptom severity: moderate  Duration of current treatment : chronic Side effects: no Medication compliance: good compliance Psychotherapy/counseling: none Previous psychiatric medications: none Depressed mood: yes Anxious mood: no Anhedonia: no Significant  weight loss or gain: no Insomnia: none Fatigue: yes Feelings of worthlessness or guilt: no Impaired concentration/indecisiveness: no Suicidal ideations: no Hopelessness: no Crying spells: no Depression screen [facility] 2/9 06/18/2021 03/06/2021 02/05/2021 01/15/2021 11/21/2020  Decreased Interest 1 0 1 0 2  Down, Depressed, Hopeless _0 PHQ - 2 Score _1 Altered sleeping 3 0 0 2 0  Tired, decreased energy - _2 Change in appetite _3 Feeling bad or failure about yourself  0 0 2 0 0  Trouble concentrating 0 3 0 3 0  Moving slowly or fidgety/restless 0 0 0 0 0  Suicidal thoughts 0 0 0 0 0  PHQ-9 Score _4 Difficult doing work/chores Somewhat difficult Somewhat difficult - - -  Some recent data might be hidden    The patient does not have a history of falls. I did not complete a risk assessment for falls. A plan of care for falls was not documented.   Past Medical History:  Past Medical History:  Diagnosis Date  . Asthma   . COPD (chronic obstructive pulmonary disease) ([place])   . Depression   . High cholesterol   . Osteoporosis   . Thyroid disease     Surgical History:  Past Surgical History:  Procedure Laterality Date  . TUBAL LIGATION      Medications:  Current Outpatient Medications on File Prior to Visit  Medication Sig  . albuterol (VENTOLIN HFA) 108 (90 Base) MCG/ACT inhaler Inhale 2 puffs into the lungs every 6 (six) hours as needed for wheezing or shortness of breath.  Marland Kitchen azelastine (OPTIVAR) 0.05 % ophthalmic solution Place 1 drop into both eyes 2 (two) times daily.  Marland Kitchen buPROPion (WELLBUTRIN XL) 150 MG 24 hr tablet TAKE ONE TABLET BY MOUTH EVERY DAY  . cetirizine (ZYRTEC) 10 MG tablet TAKE ONE TABLET BY MOUTH EVERY DAY  . Cholecalciferol 1.25 MG (50000 UT) TABS Take 1 tablet by mouth once a week.  . cyclobenzaprine (FLEXERIL) 10 MG tablet Take 1 tablet (10 mg total) by mouth at bedtime.  Marland Kitchen FLUoxetine (PROZAC) 20 MG tablet Take 1 tablet (20 mg total) by mouth daily.  . fluticasone (FLONASE) 50 MCG/ACT nasal spray PLACE 2 SPRAYS INTO BOTH NOSTRILS EVERY DAY  . gabapentin (NEURONTIN) 300 MG capsule TAKE ONE CAPSULE BY MOUTH 3 TIMES A DAY  . ipratropium-albuterol (DUONEB) 0.5-2.5 (3) MG/3ML SOLN Take 3 mLs by nebulization every 6 (six) hours as needed.  Marland Kitchen levothyroxine (SYNTHROID) 125 MCG tablet Take 1 tablet (125 mcg total) by mouth daily.  . montelukast (SINGULAIR) 10 MG tablet Take 1 tablet  (10 mg total) by mouth at bedtime.  Marland Kitchen omeprazole (PRILOSEC) 40 MG capsule TAKE ONE CAPSULE BY MOUTH EVERY EVENING  . rosuvastatin (CRESTOR) 20 MG tablet TAKE ONE TABLET BY MOUTH EVERY DAY  . traZODone (DESYREL) 50 MG tablet TAKE ONE TABLET BY MOUTH AT BEDTIME AS NEEDED  . vitamin B-12 (CYANOCOBALAMIN) 500 MCG tablet Take 1 tablet (500 mcg total) by mouth daily.   No current facility-administered medications on file prior to visit.    Allergies:  Allergies  Allergen Reactions  . Biaxin [Clarithromycin]     Patient reports itching, nausea  . Iodine     Iodine blisters skin    Social History:  Social History   Socioeconomic History  . Marital status: Divorced    Spouse name: Not on  file  . Number of children: 3  . Years of education: Not on file  . Highest education level: Not on file  Occupational History  . Not on file  Tobacco Use  . Smoking status: Former    Pack years: 0.00    Types: Cigarettes    Quit date: 11/15/2005    Years since quitting: 15.6  . Smokeless tobacco: Never  Vaping Use  . Vaping Use: Never used  Substance and Sexual Activity  . Alcohol use: Not Currently    Comment: pt states on occasion  . Drug use: No  . Sexual activity: Yes  Other Topics Concern  . Not on file  Social History Narrative   Social determinants completed. HS   Works at [employer] has 3 children.   Social Determinants of Health   Financial Resource Strain: Medium Risk  . Difficulty of Paying Living Expenses: Somewhat hard  Food Insecurity: Food Insecurity Present  . Worried About Charity fundraiser in the Last Year: Sometimes true  . Ran Out of Food in the Last Year: Never true  Transportation Needs: No Transportation Needs  . Lack of Transportation (Medical): No  . Lack of Transportation (Non-Medical): No  Physical Activity: Inactive  . Days of Exercise per Week: 0 days  . Minutes of Exercise per Session: 0 min  Stress: Stress Concern Present  . Feeling of Stress : To  some extent  Social Connections: Unknown  . Frequency of Communication with Friends and Family: More than three times a week  . Frequency of Social Gatherings with Friends and Family: More than three times a week  . Attends Religious Services: Never  . Active Member of Clubs or Organizations: No  . Attends Archivist Meetings: Never  . Marital Status: Patient refused  Intimate Partner Violence: Not on file   Social History   Tobacco Use  Smoking Status Former  . Pack years: 0.00  . Types: Cigarettes  . Quit date: 11/15/2005  . Years since quitting: 15.6  Smokeless Tobacco Never   Social History   Substance and Sexual Activity  Alcohol Use Not Currently   Comment: pt states on occasion    Family History:  Family History  Problem Relation Age of Onset  . Heart disease Mother   . Heart disease Father   . Lung disease Father   . Diabetes Sister   . Diabetes Maternal Grandmother   . Heart disease Maternal Grandmother   . Diabetes Maternal Grandfather   . Heart disease Maternal Grandfather   . Diabetes Sister     Past medical history, surgical history, medications, allergies, family history and social history reviewed with patient today and changes made to appropriate areas of the chart.   ROS All other ROS negative except what is listed above and in the HPI.      Objective:    BP 100/67   Pulse 88   Temp 98.4 F (36.9 C) (Oral)   Wt 199 lb 3.2 oz (90.4 kg)   LMP  (LMP Unknown)   SpO2 95%   BMI 31.20 kg/m   Wt Readings from Last 3 Encounters:  06/18/21 199 lb 3.2 oz (90.4 kg)  05/14/21 198 lb 12.8 oz (90.2 kg)  01/22/21 194 lb 11.2 oz (88.3 kg)    Physical Exam Vitals and nursing note reviewed. Exam conducted with a chaperone present.  Constitutional:      General: She is awake. She is not in acute distress.  Appearance: She is well-developed. She is not ill-appearing.  HENT:     Head: Normocephalic and atraumatic.     Right Ear: Hearing,  tympanic membrane, ear canal and external ear normal. No drainage.     Left Ear: Hearing, tympanic membrane, ear canal and external ear normal. No drainage.     Nose: Nose normal.     Right Sinus: No maxillary sinus tenderness or frontal sinus tenderness.     Left Sinus: No maxillary sinus tenderness or frontal sinus tenderness.     Mouth/Throat:     Mouth: Mucous membranes are moist.     Pharynx: Oropharynx is clear. Uvula midline. No pharyngeal swelling, oropharyngeal exudate or posterior oropharyngeal erythema.  Eyes:     General: Lids are normal.        Right eye: No discharge.        Left eye: No discharge.     Extraocular Movements: Extraocular movements intact.     Conjunctiva/sclera: Conjunctivae normal.     Pupils: Pupils are equal, round, and reactive to light.     Visual Fields: Right eye visual fields normal and left eye visual fields normal.  Neck:     Thyroid: No thyromegaly.     Vascular: No carotid bruit.     Trachea: Trachea normal.  Cardiovascular:     Rate and Rhythm: Normal rate and regular rhythm.     Heart sounds: Normal heart sounds. No murmur heard.   No gallop.  Pulmonary:     Effort: Pulmonary effort is normal. No accessory muscle usage or respiratory distress.     Breath sounds: Decreased breath sounds and wheezing present.     Comments: Slightly diminished breath sounds throughout with occasional expiratory wheezes noted. Chest:  Breasts:    Right: Normal. No axillary adenopathy or supraclavicular adenopathy.     Left: Normal. No axillary adenopathy or supraclavicular adenopathy.  Abdominal:     General: Bowel sounds are normal.     Palpations: Abdomen is soft. There is no hepatomegaly or splenomegaly.     Tenderness: There is no abdominal tenderness.     Hernia: There is no hernia in the left inguinal area or right inguinal area.  Genitourinary:    Exam position: Lithotomy position.     Labia:        Right: No rash.        Left: No rash.       Vagina: Erythema (mild erythema along walls) present.     Cervix: Normal.     Uterus: Normal.      Adnexa: Right adnexa normal and left adnexa normal.     Comments: Cervix slightly posterior and viewed.  Pap obtained. Musculoskeletal:        General: Normal range of motion.     Cervical back: Normal range of motion and neck supple.     Right lower leg: No edema.     Left lower leg: No edema.  Lymphadenopathy:     Head:     Right side of head: No submental, submandibular, tonsillar, preauricular or posterior auricular adenopathy.     Left side of head: No submental, submandibular, tonsillar, preauricular or posterior auricular adenopathy.     Cervical: No cervical adenopathy.     Upper Body:     Right upper body: No supraclavicular, axillary or pectoral adenopathy.     Left upper body: No supraclavicular, axillary or pectoral adenopathy.  Skin:    General: Skin is warm and dry.  Capillary Refill: Capillary refill takes less than 2 seconds.     Findings: No rash.  Neurological:     Mental Status: She is alert and oriented to person, place, and time.     Cranial Nerves: Cranial nerves are intact.     Gait: Gait is intact.     Deep Tendon Reflexes: Reflexes are normal and symmetric.     Reflex Scores:      Brachioradialis reflexes are 2+ on the right side and 2+ on the left side.      Patellar reflexes are 2+ on the right side and 2+ on the left side. Psychiatric:        Attention and Perception: Attention normal.        Mood and Affect: Mood normal.        Speech: Speech normal.        Behavior: Behavior normal. Behavior is cooperative.        Thought Content: Thought content normal.        Judgment: Judgment normal.    Results for orders placed or performed in visit on 05/14/21  C-reactive protein  Result Value Ref Range   CRP <1 0 - 10 mg/L  Sed Rate (ESR)  Result Value Ref Range   Sed Rate 8 0 - 40 mm/hr  ANA w/Reflex if Positive  Result Value Ref Range   Anti  Nuclear Antibody (ANA) Negative Negative  Lipid Panel w/o Chol/HDL Ratio  Result Value Ref Range   Cholesterol, Total 167 100 - 199 mg/dL   Triglycerides 120 0 - 149 mg/dL   HDL 68 >39 mg/dL   VLDL Cholesterol Cal 21 5 - 40 mg/dL   LDL Chol Calc (NIH) 78 0 - 99 mg/dL  TSH  Result Value Ref Range   TSH 0.364 (L) 0.450 - 4.500 uIU/mL  T4, free  Result Value Ref Range   Free T4 1.66 0.82 - 1.77 ng/dL  VITAMIN D 25 Hydroxy (Vit-D Deficiency, Fractures)  Result Value Ref Range   Vit D, 25-Hydroxy 15.5 (L) 30.0 - 100.0 ng/mL  Vitamin B12  Result Value Ref Range   Vitamin B-12 419 232 - 1,245 pg/mL  CBC with Differential/Platelet  Result Value Ref Range   WBC 5.2 3.4 - 10.8 x10E3/uL   RBC 4.23 3.77 - 5.28 x10E6/uL   Hemoglobin 13.3 11.1 - 15.9 g/dL   Hematocrit 39.9 34.0 - 46.6 %   MCV 94 79 - 97 fL   MCH 31.4 26.6 - 33.0 pg   MCHC 33.3 31.5 - 35.7 g/dL   RDW 12.3 11.7 - 15.4 %   Platelets 230 150 - 450 x10E3/uL   Neutrophils 74 Not Estab. %   Lymphs 17 Not Estab. %   Monocytes 6 Not Estab. %   Eos 2 Not Estab. %   Basos 0 Not Estab. %   Neutrophils Absolute 3.8 1.4 - 7.0 x10E3/uL   Lymphocytes Absolute 0.9 0.7 - 3.1 x10E3/uL   Monocytes Absolute 0.3 0.1 - 0.9 x10E3/uL   EOS (ABSOLUTE) 0.1 0.0 - 0.4 x10E3/uL   Basophils Absolute 0.0 0.0 - 0.2 x10E3/uL   Immature Granulocytes 1 Not Estab. %   Immature Grans (Abs) 0.0 0.0 - 0.1 x10E3/uL  Comprehensive metabolic panel  Result Value Ref Range   Glucose 100 (H) 65 - 99 mg/dL   BUN 16 6 - 24 mg/dL   Creatinine, Ser 0.97 0.57 - 1.00 mg/dL   eGFR 67 >59 mL/min/1.73   BUN/Creatinine Ratio 16 9 -  23   Sodium 142 134 - 144 mmol/L   Potassium 4.1 3.5 - 5.2 mmol/L   Chloride 105 96 - 106 mmol/L   CO2 22 20 - 29 mmol/L   Calcium 9.1 8.7 - 10.2 mg/dL   Total Protein 6.3 6.0 - 8.5 g/dL   Albumin 4.4 3.8 - 4.9 g/dL   Globulin, Total 1.9 1.5 - 4.5 g/dL   Albumin/Globulin Ratio 2.3 (H) 1.2 - 2.2   Bilirubin Total 0.5 0.0 - 1.2 mg/dL    Alkaline Phosphatase 50 44 - 121 IU/L   AST 17 0 - 40 IU/L   ALT 22 0 - 32 IU/L  Alpha-1-antitrypsin  Result Value Ref Range   A-1 Antitrypsin 123 101 - 187 mg/dL  Hemoglobin A1c  Result Value Ref Range   Hgb A1c MFr Bld 5.6 4.8 - 5.6 %   Est. average glucose Bld gHb Est-mCnc 114 mg/dL      Assessment & Plan:   Problem List Items Addressed This Visit       Respiratory   Stage 2 moderate COPD by GOLD classification ([place]) - Primary    Chronic, ongoing, on review CXR in past bronchitic changes noted -- reviewed this with patient and educated her on COPD, do suspect some underlying emphysema due to past smoking.  May benefit from lung CT in future if poor symptoms control, may qualify for lung CA CT screening, will discuss further with her next visit.  Currently FEV1 43% and FEV1/FVC 91%, some decline since past check -- although suspect mild exacerbation present at this time. Will send in Augmentin, Prednisone burst, and Tessalon.  Stop Symbicort and start Judithann Sauger, educated her on this and provided her sample to start in office.  Continue Ventolin as needed.  CCM referral placed last visit to discuss costs, but does not qualify.  Return in 6 weeks.       Relevant Medications   predniSONE (DELTASONE) 20 MG tablet   Budeson-Glycopyrrol-Formoterol (BREZTRI AEROSPHERE) 160-9-4.8 MCG/ACT AERO   ipratropium-albuterol (DUONEB) 0.5-2.5 (3) MG/3ML nebulizer solution 3 mL   benzonatate (TESSALON PERLES) 100 MG capsule   Other Relevant Orders   Spirometry with graph (Completed)     Endocrine   Hypothyroidism    Chronic, ongoing.  Continue current medication regimen and adjust as needed based on labs.  Recent adjustment 4 weeks ago, plan for recheck labs next visit in 6 weeks.          Other   Depression    Chronic, ongoing.  Denies SI/HI.  At this time continue Wellbutrin and Prozac + Trazodone as needed for sleep.  Could consider in future discontinuation of Prozac and change to  Duloxetine which may also benefit her chronic pain, along with mood.  Return in 6 months for mood.       Mixed hyperlipidemia    Chronic, ongoing.  Continue current medication regimen and adjust as needed.  Lipid panel up to date.       Health care maintenance    Refuses tetanus. Shingrix, and PNA vaccines. Mammogram ordered.  Cologuard ordered last visit and she sent in last week.  Pap obtained today.       Chronic back pain    Chronic, ongoing.  She reports benefit from Flexeril and Gabapentin, will maintain and renal dose as needed.  Could consider repeat imaging if worsening.  Discussed with her possibly changing her Prozac to Duloxetine in future which may offer more benefit to both mood and pain level with  her Gabapentin.           Relevant Medications   predniSONE (DELTASONE) 20 MG tablet   Other Visit Diagnoses     Encounter for screening mammogram for malignant neoplasm of breast       Mammogram ordered today   Relevant Orders   MM 3D SCREEN BREAST BILATERAL   Cervical cancer screening       Pap obtained and sent to lab.   Relevant Orders   Cytology - PAP   Annual physical exam       Annual labs up to date.  Health maintenance reviewed.        Follow up plan: Return in about 6 weeks (around 07/30/2021) for COPD with Breztri added and Symbicort stopped + thyroid lab check.   LABORATORY TESTING:  - Pap smear: pap done  IMMUNIZATIONS:   - Tdap: Tetanus vaccination status reviewed: refused. - Influenza: Up to date - Pneumovax: Refused - Prevnar: Refused - HPV: Not applicable - Zostavax vaccine: Refused  SCREENING: -Mammogram: Ordered today  - Colonoscopy: Cologuard ordered recent visit 05/16/21 - Bone Density: Not applicable  -Hearing Test: Not applicable  -Spirometry: Ordered today   PATIENT COUNSELING:   Advised to take 1 mg of folate supplement per day if capable of pregnancy.   Sexuality: Discussed sexually transmitted diseases, partner selection,  use of condoms, avoidance of unintended pregnancy  and contraceptive alternatives.   Advised to avoid cigarette smoking.  I discussed with the patient that most people either abstain from alcohol or drink within safe limits (<=14/week and <=4 drinks/occasion for males, <=7/weeks and <= 3 drinks/occasion for females) and that the risk for alcohol disorders and other health effects rises proportionally with the number of drinks per week and how often a drinker exceeds daily limits.  Discussed cessation/primary prevention of drug use and availability of treatment for abuse.   Diet: Encouraged to adjust caloric intake to maintain  or achieve ideal body weight, to reduce intake of dietary saturated fat and total fat, to limit sodium intake by avoiding high sodium foods and not adding table salt, and to maintain adequate dietary potassium and calcium preferably from fresh fruits, vegetables, and low-fat dairy products.    Stressed the importance of regular exercise  Injury prevention: Discussed safety belts, safety helmets, smoke detector, smoking near bedding or upholstery.   Dental health: Discussed importance of regular tooth brushing, flossing, and dental visits.    NEXT PREVENTATIVE PHYSICAL DUE IN 1 YEAR. Return in about 6 weeks (around 07/30/2021) for COPD with Breztri added and Symbicort stopped + thyroid lab check.

## 2021-06-18 NOTE — Assessment & Plan Note (Signed)
Chronic, ongoing.  Continue current medication regimen and adjust as needed.  Lipid panel up to date. 

## 2021-06-18 NOTE — Assessment & Plan Note (Signed)
Chronic, ongoing.  She reports benefit from Flexeril and Gabapentin, will maintain and renal dose as needed.  Could consider repeat imaging if worsening.  Discussed with her possibly changing her Prozac to Duloxetine in future which may offer more benefit to both mood and pain level with her Gabapentin.

## 2021-06-25 ENCOUNTER — Telehealth: Payer: Self-pay | Admitting: General Practice

## 2021-06-25 ENCOUNTER — Telehealth: Payer: BC Managed Care – PPO

## 2021-06-25 LAB — CYTOLOGY - PAP
Comment: NEGATIVE
Diagnosis: NEGATIVE
High risk HPV: NEGATIVE

## 2021-06-25 NOTE — Telephone Encounter (Signed)
  Care Management   Follow Up Note   06/25/2021 Name: Audrey Richard MRN: [id number] DOB: December 29, 1960   Referred by: Marjie Skiff, NP Reason for referral : Care Coordination (RNCM: Initial Outreach for Chronic Disease Management and Care Coordination Needs)   An unsuccessful telephone outreach was attempted today. The patient was referred to the case management team for assistance with care management and care coordination.   Follow Up Plan: A HIPPA compliant phone message was left for the patient providing contact information and requesting a return call.   Alto Denver RN, MSN, CCM Community Care Coordinator [place]  [facility] Mobile: [phone]

## 2021-06-25 NOTE — Progress Notes (Signed)
Contacted via MyChart   Good morning Audrey Richard, your pap returned normal -- no cancerous findings.  Woohoo!!  We can repeat in 5 years and if negative that check it can be your last pap unless you wish to continue these.  Have a great day!!

## 2021-07-02 ENCOUNTER — Telehealth: Payer: Self-pay

## 2021-07-02 NOTE — Telephone Encounter (Signed)
Copied from CRM [id number]. Topic: General - Inquiry >> Jul 01, 2021  2:58 PM Aretta Nip wrote: Francisco Capuchin called in Budeson-Glycopyrrol-Formoterol (BREZTRI AEROSPHERE) 160-9-4.8 MCG/ACT AERO 10.7 g 11 06/18/2021   Sig - Route: Inhale 2 puffs into the lungs 2 (two) times daily. - Inhalation   Pt states that she is not any better. She is requesting a stronger antibiotic. Pt has not been seen by dr.  Rock Nephew was offered an appt but refused stating this is something she has always had b/c she has lung issues and she does not need to see the dr. Rock Nephew wants a fu call from [facility] or nurse regarding stronger scripts, fu at [phone]  Would pt need apt?

## 2021-07-03 ENCOUNTER — Emergency Department: Payer: BC Managed Care – PPO

## 2021-07-03 ENCOUNTER — Telehealth: Payer: Self-pay

## 2021-07-03 ENCOUNTER — Inpatient Hospital Stay
Admission: EM | Admit: 2021-07-03 | Discharge: 2021-07-12 | DRG: 190 | Disposition: A | Discharge status: Home / Self Care | Payer: BC Managed Care – PPO | Attending: Internal Medicine | Admitting: Internal Medicine

## 2021-07-03 ENCOUNTER — Other Ambulatory Visit: Payer: Self-pay

## 2021-07-03 DIAGNOSIS — F32A Depression, unspecified: Secondary | ICD-10-CM | POA: Diagnosis present

## 2021-07-03 DIAGNOSIS — J441 Chronic obstructive pulmonary disease with (acute) exacerbation: Principal | ICD-10-CM | POA: Diagnosis present

## 2021-07-03 DIAGNOSIS — E063 Autoimmune thyroiditis: Secondary | ICD-10-CM | POA: Diagnosis present

## 2021-07-03 DIAGNOSIS — D729 Disorder of white blood cells, unspecified: Secondary | ICD-10-CM

## 2021-07-03 DIAGNOSIS — R059 Cough, unspecified: Secondary | ICD-10-CM | POA: Diagnosis not present

## 2021-07-03 DIAGNOSIS — Z833 Family history of diabetes mellitus: Secondary | ICD-10-CM

## 2021-07-03 DIAGNOSIS — Z79899 Other long term (current) drug therapy: Secondary | ICD-10-CM

## 2021-07-03 DIAGNOSIS — F419 Anxiety disorder, unspecified: Secondary | ICD-10-CM | POA: Diagnosis present

## 2021-07-03 DIAGNOSIS — R609 Edema, unspecified: Secondary | ICD-10-CM | POA: Diagnosis present

## 2021-07-03 DIAGNOSIS — Z87891 Personal history of nicotine dependence: Secondary | ICD-10-CM

## 2021-07-03 DIAGNOSIS — N179 Acute kidney failure, unspecified: Secondary | ICD-10-CM | POA: Diagnosis present

## 2021-07-03 DIAGNOSIS — Z20822 Contact with and (suspected) exposure to covid-19: Secondary | ICD-10-CM | POA: Diagnosis present

## 2021-07-03 DIAGNOSIS — E039 Hypothyroidism, unspecified: Secondary | ICD-10-CM | POA: Diagnosis present

## 2021-07-03 DIAGNOSIS — G629 Polyneuropathy, unspecified: Secondary | ICD-10-CM | POA: Diagnosis present

## 2021-07-03 DIAGNOSIS — J449 Chronic obstructive pulmonary disease, unspecified: Secondary | ICD-10-CM | POA: Diagnosis present

## 2021-07-03 DIAGNOSIS — Z79890 Hormone replacement therapy (postmenopausal): Secondary | ICD-10-CM

## 2021-07-03 DIAGNOSIS — J9601 Acute respiratory failure with hypoxia: Secondary | ICD-10-CM | POA: Diagnosis present

## 2021-07-03 DIAGNOSIS — I5032 Chronic diastolic (congestive) heart failure: Secondary | ICD-10-CM | POA: Diagnosis present

## 2021-07-03 DIAGNOSIS — N390 Urinary tract infection, site not specified: Secondary | ICD-10-CM | POA: Diagnosis present

## 2021-07-03 DIAGNOSIS — E782 Mixed hyperlipidemia: Secondary | ICD-10-CM | POA: Diagnosis present

## 2021-07-03 DIAGNOSIS — Z28310 Unvaccinated for covid-19: Secondary | ICD-10-CM

## 2021-07-03 DIAGNOSIS — K219 Gastro-esophageal reflux disease without esophagitis: Secondary | ICD-10-CM | POA: Diagnosis present

## 2021-07-03 DIAGNOSIS — G4700 Insomnia, unspecified: Secondary | ICD-10-CM | POA: Diagnosis present

## 2021-07-03 DIAGNOSIS — M810 Age-related osteoporosis without current pathological fracture: Secondary | ICD-10-CM | POA: Diagnosis present

## 2021-07-03 DIAGNOSIS — D72828 Other elevated white blood cell count: Secondary | ICD-10-CM

## 2021-07-03 DIAGNOSIS — Z8249 Family history of ischemic heart disease and other diseases of the circulatory system: Secondary | ICD-10-CM

## 2021-07-03 LAB — CBC WITH DIFFERENTIAL/PLATELET
Abs Immature Granulocytes: 0.09 10*3/uL — ABNORMAL HIGH (ref 0.00–0.07)
Basophils Absolute: 0 10*3/uL (ref 0.0–0.1)
Basophils Relative: 0 %
Eosinophils Absolute: 0.2 10*3/uL (ref 0.0–0.5)
Eosinophils Relative: 1 %
HCT: 38.6 % (ref 36.0–46.0)
Hemoglobin: 13.2 g/dL (ref 12.0–15.0)
Immature Granulocytes: 1 %
Lymphocytes Relative: 7 %
Lymphs Abs: 1 10*3/uL (ref 0.7–4.0)
MCH: 31.7 pg (ref 26.0–34.0)
MCHC: 34.2 g/dL (ref 30.0–36.0)
MCV: 92.8 fL (ref 80.0–100.0)
Monocytes Absolute: 1 10*3/uL (ref 0.1–1.0)
Monocytes Relative: 7 %
Neutro Abs: 13 10*3/uL — ABNORMAL HIGH (ref 1.7–7.7)
Neutrophils Relative %: 84 %
Platelets: 248 10*3/uL (ref 150–400)
RBC: 4.16 MIL/uL (ref 3.87–5.11)
RDW: 12.9 % (ref 11.5–15.5)
WBC: 15.3 10*3/uL — ABNORMAL HIGH (ref 4.0–10.5)
nRBC: 0 % (ref 0.0–0.2)

## 2021-07-03 LAB — COMPREHENSIVE METABOLIC PANEL
ALT: 31 U/L (ref 0–44)
AST: 25 U/L (ref 15–41)
Albumin: 3.4 g/dL — ABNORMAL LOW (ref 3.5–5.0)
Alkaline Phosphatase: 49 U/L (ref 38–126)
Anion gap: 12 (ref 5–15)
BUN: 10 mg/dL (ref 6–20)
CO2: 21 mmol/L — ABNORMAL LOW (ref 22–32)
Calcium: 8.6 mg/dL — ABNORMAL LOW (ref 8.9–10.3)
Chloride: 104 mmol/L (ref 98–111)
Creatinine, Ser: 0.88 mg/dL (ref 0.44–1.00)
GFR, Estimated: >60 mL/min (ref >60)
Glucose, Bld: 97 mg/dL (ref 70–99)
Potassium: 3.7 mmol/L (ref 3.5–5.1)
Sodium: 137 mmol/L (ref 135–145)
Total Bilirubin: 0.9 mg/dL (ref 0.3–1.2)
Total Protein: 7.1 g/dL (ref 6.5–8.1)

## 2021-07-03 LAB — URINALYSIS, COMPLETE (UACMP) WITH MICROSCOPIC
Bilirubin Urine: NEGATIVE
Glucose, UA: NEGATIVE mg/dL
Ketones, ur: 20 mg/dL — AB
Nitrite: NEGATIVE
Protein, ur: NEGATIVE mg/dL
Specific Gravity, Urine: 1.015 (ref 1.005–1.030)
pH: 5 (ref 5.0–8.0)

## 2021-07-03 LAB — TROPONIN I (HIGH SENSITIVITY)
Troponin I (High Sensitivity): 3 ng/L (ref <18)
Troponin I (High Sensitivity): 3 ng/L (ref <18)

## 2021-07-03 LAB — RESP PANEL BY RT-PCR (FLU A&B, COVID) ARPGX2
Influenza A by PCR: NEGATIVE
Influenza B by PCR: NEGATIVE
SARS Coronavirus 2 by RT PCR: NEGATIVE

## 2021-07-03 LAB — EXPECTORATED SPUTUM ASSESSMENT W GRAM STAIN, RFLX TO RESP C

## 2021-07-03 LAB — PROCALCITONIN: Procalcitonin: <0.1 ng/mL

## 2021-07-03 LAB — BRAIN NATRIURETIC PEPTIDE: B Natriuretic Peptide: 88 pg/mL (ref 0.0–100.0)

## 2021-07-03 MED ORDER — ONDANSETRON HCL 4 MG/2ML IJ SOLN: 4.0000 mg | Freq: Four times a day (QID) | INTRAMUSCULAR | Status: DC | PRN

## 2021-07-03 MED ORDER — IPRATROPIUM-ALBUTEROL 0.5-2.5 (3) MG/3ML IN SOLN
3.0000 mL | Freq: Four times a day (QID) | RESPIRATORY_TRACT | Status: DC
  Administered 2021-07-03 – 2021-07-04 (×4): 3 mL via RESPIRATORY_TRACT
  Filled 2021-07-03 (×4): qty 3

## 2021-07-03 MED ORDER — IPRATROPIUM-ALBUTEROL 0.5-2.5 (3) MG/3ML IN SOLN
3.0000 mL | Freq: Once | RESPIRATORY_TRACT | Status: AC
  Administered 2021-07-03: 3 mL via RESPIRATORY_TRACT
  Filled 2021-07-03: qty 3

## 2021-07-03 MED ORDER — SODIUM CHLORIDE 0.9 % IV SOLN
1.0000 g | INTRAVENOUS | Status: AC
  Administered 2021-07-04 – 2021-07-05 (×2): 1 g via INTRAVENOUS
  Filled 2021-07-03 (×2): qty 1

## 2021-07-03 MED ORDER — METHYLPREDNISOLONE SODIUM SUCC 125 MG IJ SOLR
125.0000 mg | Freq: Once | INTRAMUSCULAR | Status: AC
  Administered 2021-07-03: 125 mg via INTRAVENOUS
  Filled 2021-07-03: qty 2

## 2021-07-03 MED ORDER — ACETAMINOPHEN 650 MG RE SUPP: 650.0000 mg | Freq: Four times a day (QID) | RECTAL | Status: AC | PRN

## 2021-07-03 MED ORDER — ACETAMINOPHEN 325 MG PO TABS: 650.0000 mg | ORAL_TABLET | Freq: Four times a day (QID) | ORAL | Status: AC | PRN

## 2021-07-03 MED ORDER — LEVOTHYROXINE SODIUM 25 MCG PO TABS
125.0000 ug | ORAL_TABLET | Freq: Every day | ORAL | Status: DC
  Administered 2021-07-04 – 2021-07-12 (×9): 125 ug via ORAL
  Filled 2021-07-03 (×9): qty 1

## 2021-07-03 MED ORDER — SODIUM CHLORIDE 0.9 % IV SOLN
500.0000 mg | INTRAVENOUS | Status: DC
  Administered 2021-07-04 – 2021-07-05 (×2): 500 mg via INTRAVENOUS
  Filled 2021-07-03 (×2): qty 500

## 2021-07-03 MED ORDER — TRAZODONE HCL 50 MG PO TABS
25.0000 mg | ORAL_TABLET | Freq: Every evening | ORAL | Status: DC | PRN
  Administered 2021-07-03 – 2021-07-06 (×3): 25 mg via ORAL
  Filled 2021-07-03 (×3): qty 1

## 2021-07-03 MED ORDER — LIDOCAINE 5 % EX PTCH
1.0000 | MEDICATED_PATCH | CUTANEOUS | Status: DC
  Administered 2021-07-03 – 2021-07-05 (×3): 1 via TRANSDERMAL
  Filled 2021-07-03 (×8): qty 1

## 2021-07-03 MED ORDER — SODIUM CHLORIDE 0.9 % IV SOLN
1.0000 g | Freq: Once | INTRAVENOUS | Status: AC
  Administered 2021-07-03: 1 g via INTRAVENOUS
  Filled 2021-07-03: qty 10

## 2021-07-03 MED ORDER — BUPROPION HCL ER (XL) 150 MG PO TB24
150.0000 mg | ORAL_TABLET | Freq: Every day | ORAL | Status: DC
  Administered 2021-07-03 – 2021-07-12 (×10): 150 mg via ORAL
  Filled 2021-07-03 (×10): qty 1

## 2021-07-03 MED ORDER — ONDANSETRON HCL 4 MG PO TABS
4.0000 mg | ORAL_TABLET | Freq: Four times a day (QID) | ORAL | Status: DC | PRN
  Administered 2021-07-04: 11:00:00 4 mg via ORAL
  Filled 2021-07-03: qty 1

## 2021-07-03 MED ORDER — SODIUM CHLORIDE 0.9 % IV SOLN
100.0000 mg | Freq: Once | INTRAVENOUS | Status: AC
  Administered 2021-07-03: 100 mg via INTRAVENOUS
  Filled 2021-07-03: qty 100

## 2021-07-03 MED ORDER — DM-GUAIFENESIN ER 30-600 MG PO TB12
1.0000 | ORAL_TABLET | Freq: Three times a day (TID) | ORAL | Status: AC | PRN
  Administered 2021-07-04 – 2021-07-06 (×6): 1 via ORAL
  Filled 2021-07-03 (×7): qty 1

## 2021-07-03 MED ORDER — HYDROCOD POLST-CPM POLST ER 10-8 MG/5ML PO SUER
5.0000 mL | Freq: Every evening | ORAL | Status: DC | PRN
  Administered 2021-07-03 – 2021-07-04 (×2): 5 mL via ORAL
  Filled 2021-07-03 (×2): qty 5

## 2021-07-03 MED ORDER — MONTELUKAST SODIUM 10 MG PO TABS
10.0000 mg | ORAL_TABLET | Freq: Every day | ORAL | Status: DC
  Administered 2021-07-03 – 2021-07-11 (×9): 10 mg via ORAL
  Filled 2021-07-03 (×10): qty 1

## 2021-07-03 MED ORDER — ALBUTEROL SULFATE (2.5 MG/3ML) 0.083% IN NEBU
2.5000 mg | INHALATION_SOLUTION | RESPIRATORY_TRACT | Status: AC | PRN
  Administered 2021-07-05: 2.5 mg via RESPIRATORY_TRACT
  Filled 2021-07-03: qty 3

## 2021-07-03 MED ORDER — METHYLPREDNISOLONE SODIUM SUCC 40 MG IJ SOLR
40.0000 mg | Freq: Two times a day (BID) | INTRAMUSCULAR | Status: AC
  Administered 2021-07-04 – 2021-07-05 (×4): 40 mg via INTRAVENOUS
  Filled 2021-07-03 (×4): qty 1

## 2021-07-03 MED ORDER — GABAPENTIN 300 MG PO CAPS
300.0000 mg | ORAL_CAPSULE | Freq: Three times a day (TID) | ORAL | Status: DC
  Administered 2021-07-03 – 2021-07-12 (×27): 300 mg via ORAL
  Filled 2021-07-03 (×27): qty 1

## 2021-07-03 MED ORDER — PANTOPRAZOLE SODIUM 40 MG PO TBEC
80.0000 mg | DELAYED_RELEASE_TABLET | Freq: Every day | ORAL | Status: DC
  Administered 2021-07-03 – 2021-07-12 (×10): 80 mg via ORAL
  Filled 2021-07-03 (×10): qty 2

## 2021-07-03 MED ORDER — ENOXAPARIN SODIUM 40 MG/0.4ML IJ SOSY
40.0000 mg | PREFILLED_SYRINGE | INTRAMUSCULAR | Status: DC
  Administered 2021-07-03 – 2021-07-11 (×9): 40 mg via SUBCUTANEOUS
  Filled 2021-07-03 (×9): qty 0.4

## 2021-07-03 MED ORDER — ROSUVASTATIN CALCIUM 20 MG PO TABS
20.0000 mg | ORAL_TABLET | Freq: Every day | ORAL | Status: DC
  Administered 2021-07-03 – 2021-07-11 (×9): 20 mg via ORAL
  Filled 2021-07-03 (×10): qty 1

## 2021-07-03 MED ORDER — FLUOXETINE HCL 20 MG PO CAPS
20.0000 mg | ORAL_CAPSULE | Freq: Every day | ORAL | Status: DC
  Administered 2021-07-03 – 2021-07-12 (×10): 20 mg via ORAL
  Filled 2021-07-03 (×10): qty 1

## 2021-07-03 NOTE — Chronic Care Management (AMB) (Signed)
  Care Management   Note  07/03/2021 Name: MARITSA HUNSUCKER MRN: [id number] DOB: Aug 07, 1961  Audrey Richard is a 60 y.o. year old female who is a primary care patient of Marjie Skiff, NP and is actively engaged with the care management team. I reached out to Audrey Richard by phone today to assist with re-scheduling an initial visit with the RN Case Manager  Follow up plan: Unsuccessful telephone outreach attempt made. A HIPAA compliant phone message was left for the patient providing contact information and requesting a return call.  The care management team will reach out to the patient again over the next 7 days.  If patient returns call to provider office, please advise to call Embedded Care Management Care Guide Penne Lash  at [phone]  Penne Lash, RMA Care Guide, Embedded Care Coordination [facility]  [place], [place] [address] Direct Dial: [phone] [email or web address]: [email or web address]

## 2021-07-03 NOTE — Progress Notes (Signed)
Patient arrived to unit in stable condition. Ambulated from stretcher to bed. Shortness of breath with exertion noted.

## 2021-07-03 NOTE — ED Provider Notes (Signed)
Patient seen in brief consult.  Patient is coughing up yellow-green phlegm which is very thick.  She is breathing hard working hard but her sats are good on 2 L they drop if she is off oxygen her heart rate is 89-90.  Blood pressure stable.  Chest x-ray does not show an obvious infiltrate although on the lateral there is a suggestion that possibly there is a posterior increasing density which may represent a pneumonia.  Since she is hypoxic and a COPD year we will get her in the hospital and treat her as such.  EKG additionally showed some ST downsloping in several leads which was new from previous EKG from 2019.  Troponin however is negative.  Probably try to keep her on the monitor.  When I saw her she is not having any chest pain pleuritic or otherwise.   Arnaldo Natal, MD 07/03/21 1336

## 2021-07-03 NOTE — ED Notes (Signed)
Pt sitting in bed with oxygen sat at 88% on RA. Pt placed on 2L BNC with sats at 97%.

## 2021-07-03 NOTE — ED Provider Notes (Signed)
[facility] Emergency Department Provider Note  ____________________________________________   Event Date/Time   First MD Initiated Contact with Patient 07/03/21 0900     (approximate)  I have reviewed the triage vital signs and the nursing notes.   HISTORY  Chief Complaint Cough  HPI Audrey Richard is a 60 y.o. female who presents to the emergency department for evaluation of cough with yellow/green sputum production for the last 1 month.  Patient has known history of COPD and reports failure of home inhalers.  She did see her PCP 2 weeks ago and was treated with course of steroid and amoxicillin.  She reports no improvement at all in her symptoms after treatment with this.  She reports progression of her shortness of breath.  She reports chest pain that she feels is associated with her coughing.  She reports feeling generalized weakness secondary to persistent coughing.  She denies any abdominal pain, nausea vomiting or diarrhea, denies dysuria or recent fevers.         Past Medical History:  Diagnosis Date   Asthma    COPD (chronic obstructive pulmonary disease) (HCC)    Depression    High cholesterol    Osteoporosis    Thyroid disease     Patient Active Problem List   Diagnosis Date Noted   COPD exacerbation (HCC) 07/03/2021   B12 deficiency 06/18/2021   Vitamin D deficiency 06/15/2021   Elevated hemoglobin A1c 05/14/2021   CKD (chronic kidney disease) stage 3, GFR 30-59 ml/min (HCC) 04/12/2021   Chronic back pain 01/10/2020   Acid reflux 09/29/2019   Primary generalized (osteo)arthritis 09/29/2019   Health care maintenance 09/29/2019   Allergic rhinitis 12/08/2017   Osteopenia 06/11/2015   Depression 06/11/2015   Insomnia 06/11/2015   Stage 2 moderate COPD by GOLD classification (HCC) 06/11/2015   Hypothyroidism 06/11/2015   Mixed hyperlipidemia 06/11/2015    Past Surgical History:  Procedure Laterality Date   TUBAL LIGATION       Prior to Admission medications   Medication Sig Start Date End Date Taking? Authorizing Provider  albuterol (VENTOLIN HFA) 108 (90 Base) MCG/ACT inhaler Inhale 2 puffs into the lungs every 6 (six) hours as needed for wheezing or shortness of breath. 08/30/19  Yes Cannady, Jolene T, NP  Budeson-Glycopyrrol-Formoterol (BREZTRI AEROSPHERE) 160-9-4.8 MCG/ACT AERO Inhale 2 puffs into the lungs 2 (two) times daily. 06/18/21  Yes Cannady, Jolene T, NP  buPROPion (WELLBUTRIN XL) 150 MG 24 hr tablet TAKE ONE TABLET BY MOUTH EVERY DAY 05/14/21  Yes Cannady, Jolene T, NP  cetirizine (ZYRTEC) 10 MG tablet TAKE ONE TABLET BY MOUTH EVERY DAY 05/14/21  Yes Cannady, Jolene T, NP  Cholecalciferol 1.25 MG (50000 UT) TABS Take 1 tablet by mouth once a week. Patient taking differently: Take 1 tablet by mouth every Monday. 05/15/21  Yes Cannady, Jolene T, NP  cyclobenzaprine (FLEXERIL) 10 MG tablet Take 1 tablet (10 mg total) by mouth at bedtime. 05/14/21  Yes Cannady, Jolene T, NP  FLUoxetine (PROZAC) 20 MG tablet Take 1 tablet (20 mg total) by mouth daily. 05/14/21  Yes Cannady, Jolene T, NP  gabapentin (NEURONTIN) 300 MG capsule TAKE ONE CAPSULE BY MOUTH 3 TIMES A DAY 05/14/21  Yes Cannady, Jolene T, NP  levothyroxine (SYNTHROID) 125 MCG tablet Take 1 tablet (125 mcg total) by mouth daily. 05/15/21  Yes Cannady, Jolene T, NP  montelukast (SINGULAIR) 10 MG tablet Take 1 tablet (10 mg total) by mouth at bedtime. 05/14/21  Yes Cannady,  Jolene T, NP  omeprazole (PRILOSEC) 40 MG capsule TAKE ONE CAPSULE BY MOUTH EVERY EVENING 05/14/21  Yes Cannady, Jolene T, NP  rosuvastatin (CRESTOR) 20 MG tablet TAKE ONE TABLET BY MOUTH EVERY DAY 05/14/21  Yes Cannady, Jolene T, NP  traZODone (DESYREL) 50 MG tablet TAKE ONE TABLET BY MOUTH AT BEDTIME AS NEEDED Patient taking differently: Take 25 mg by mouth at bedtime as needed. 05/14/21  Yes Cannady, Jolene T, NP  vitamin B-12 (CYANOCOBALAMIN) 500 MCG tablet Take 1 tablet (500 mcg total) by  mouth daily. 05/15/21  Yes Cannady, Jolene T, NP  azelastine (OPTIVAR) 0.05 % ophthalmic solution Place 1 drop into both eyes 2 (two) times daily. Patient not taking: Reported on 07/03/2021 08/30/19   Aura Dials T, NP  benzonatate (TESSALON PERLES) 100 MG capsule Take 1 capsule (100 mg total) by mouth 3 (three) times daily as needed for cough. Patient not taking: Reported on 07/03/2021 06/18/21   Aura Dials T, NP  fluticasone (FLONASE) 50 MCG/ACT nasal spray PLACE 2 SPRAYS INTO BOTH NOSTRILS EVERY DAY Patient not taking: Reported on 07/03/2021 02/12/21 02/12/22  Iloabachie, Chioma E, NP  ipratropium-albuterol (DUONEB) 0.5-2.5 (3) MG/3ML SOLN Take 3 mLs by nebulization every 6 (six) hours as needed. Patient not taking: Reported on 07/03/2021 08/30/19   Marjie Skiff, NP    Allergies Biaxin [clarithromycin] and Iodine  Family History  Problem Relation Age of Onset   Heart disease Mother    Heart disease Father    Lung disease Father    Diabetes Sister    Diabetes Maternal Grandmother    Heart disease Maternal Grandmother    Diabetes Maternal Grandfather    Heart disease Maternal Grandfather    Diabetes Sister     Social History Social History   Tobacco Use   Smoking status: Former    Pack years: 0.00    Types: Cigarettes    Quit date: 11/15/2005    Years since quitting: 15.6   Smokeless tobacco: Never  Vaping Use   Vaping Use: Never used  Substance Use Topics   Alcohol use: Not Currently    Comment: pt states on occasion   Drug use: No    Review of Systems Constitutional: +generalized weakness, No fever/chills Eyes: No visual changes. ENT: No sore throat. Cardiovascular: + chest pain. Respiratory: + cough, + shortness of breath. Gastrointestinal: No abdominal pain.  No nausea, no vomiting.  No diarrhea.  No constipation. Genitourinary: Negative for dysuria. Musculoskeletal: Negative for back pain. Skin: Negative for rash. Neurological: Negative for headaches,  focal weakness or numbness. ____________________________________________   PHYSICAL EXAM:  VITAL SIGNS: ED Triage Vitals  Enc Vitals Group     BP 07/03/21 0833 132/74     Pulse Rate 07/03/21 0833 (!) 101     Resp 07/03/21 0833 18     Temp 07/03/21 0833 98.9 F (37.2 C)     Temp Source 07/03/21 0833 Oral     SpO2 07/03/21 0833 94 %     Weight --      Height --      Head Circumference --      Peak Flow --      Pain Score 07/03/21 0834 3     Pain Loc --      Pain Edu? --      Excl. in GC? --     Constitutional: Alert and oriented. Well appearing and in no acute distress. Eyes: Conjunctivae are normal. PERRL. EOMI. Head: Atraumatic. Nose: No congestion/rhinnorhea.  Mouth/Throat: Mucous membranes are moist.  Oropharynx non-erythematous. Neck: No stridor.   Cardiovascular: Normal rate, regular rhythm. Grossly normal heart sounds.  Good peripheral circulation. Respiratory: Persistent coughing in the room with yellow/green sputum production present.  Increased work of breathing present with intermittent tripoding, pursed lip breathing with retractions. Lungs with diffuse coarse breath sounds with no wheezing, rales or rhonchi appreciated. Gastrointestinal: Soft and nontender. No distention. No abdominal bruits. No CVA tenderness. Musculoskeletal: No lower extremity tenderness nor edema.  No joint effusions. Neurologic:  Normal speech and language. No gross focal neurologic deficits are appreciated. No gait instability. Skin:  Skin is warm, dry and intact. No rash noted. Psychiatric: Mood and affect are normal. Speech and behavior are normal.  ____________________________________________   LABS (all labs ordered are listed, but only abnormal results are displayed)  Labs Reviewed  COMPREHENSIVE METABOLIC PANEL - Abnormal; Notable for the following components:      Result Value   CO2 21 (*)    Calcium 8.6 (*)    Albumin 3.4 (*)    All other components within normal limits  CBC  WITH DIFFERENTIAL/PLATELET - Abnormal; Notable for the following components:   WBC 15.3 (*)    Neutro Abs 13.0 (*)    Abs Immature Granulocytes 0.09 (*)    All other components within normal limits  EXPECTORATED SPUTUM ASSESSMENT W GRAM STAIN, RFLX TO RESP C  RESP PANEL BY RT-PCR (FLU A&B, COVID) ARPGX2  CULTURE, RESPIRATORY W GRAM STAIN  PROCALCITONIN  URINALYSIS, COMPLETE (UACMP) WITH MICROSCOPIC  TROPONIN I (HIGH SENSITIVITY)  TROPONIN I (HIGH SENSITIVITY)   ____________________________________________  EKG  Normal sinus rhythm with a rate of 95 bpm.  There is some downsloping present in 2 beats of V4/V5/V6 of uncertain acuity but new when compared to prior EKG.  Borderline prolonged QTC of 467.  EKG reviewed personally by attending Dorothea Glassman, MD. ____________________________________________  RADIOLOGY I, Lucy Chris, personally viewed and evaluated these images (plain radiographs) as part of my medical decision making, as well as reviewing the written report by the radiologist.  ED provider interpretation: There is some increased haziness appreciated in the bilateral lower lobes with no discrete focal pneumonia.  Official radiology report(s): DG Chest 2 View  Result Date: 07/03/2021 CLINICAL DATA:  Cough EXAM: CHEST - 2 VIEW COMPARISON:  01/22/2021 FINDINGS: Bilateral chronic interstitial thickening. No focal consolidation. No pleural effusion or pneumothorax. Heart and mediastinal contours are unremarkable. No acute osseous abnormality. Old healed bilateral rib fractures. IMPRESSION: No active cardiopulmonary disease. Electronically Signed   By: Elige Ko   On: 07/03/2021 09:36     ____________________________________________   INITIAL IMPRESSION / ASSESSMENT AND PLAN / ED COURSE  As part of my medical decision making, I reviewed the following data within the electronic MEDICAL RECORD NUMBER Nursing notes reviewed and incorporated, Labs reviewed, EKG interpreted, Old  EKG reviewed, Old chart reviewed, Radiograph reviewed, Discussed with admitting physician Dr. Sedalia Muta, Evaluated by EM attending Dr. Juliette Alcide, and Notes from prior ED visits        Patient is a 60 year old female who presents to the emergency department with complaints of cough and worsening shortness of breath over the last 1 month.  She failed outpatient therapy with steroid and amoxicillin that was prescribed by her PCP.  See HPI for further details.  In triage, patient is afebrile, mildly tachycardic with a rate of 101, normotensive, respirations of 18 with SPO2 of 94%.  Intermittently throughout the time she was being evaluated,  she had transient decreases in oxygenation down to 88% at rest.  She was placed on 2 L by nasal cannula at that time.  On physical exam patient has diffuse coarse breath sounds.  Without any specific rales, rhonchi or wheezing.  She does have notable increased work of breathing that is intermittent with some tripoding, retractions and pursed lip breathing at times.  Patient also noted to intermittently become tachypneic up to a rate of 24.  Differentials considered include ACS, pneumonia, COPD exacerbation  Evaluation was begun with CBC, CMP, respiratory panel, troponin, chest x-ray, EKG.  EKG does demonstrate some new downsloping in V4/V5/V6 when compared to prior EKG though this is age-indeterminate.  CBC demonstrates white count of 15.3 with a left shift.  CMP with some mild hypocalcemia mild hypoalbuminemia but otherwise unremarkable.  Respiratory panel negative.  Initial troponin and repeat troponins are negative.  Chest x-ray without any frank pneumonia, however there is some possible haziness in the bilateral lower lobes.  Patient was administered 3 duo nebs as well as Solu-Medrol and still noted to desat to 88% intermittently at rest.  She states she feels much more comfortable with the oxygen on, and is noted to have decreased work of breathing with this in place.   Low suspicion at this time for ACS given negative troponins.  Patient is noted to have a yellow/green significant amount of sputum production, more consistent with COPD exacerbation/possible early pneumonia.  Given her desaturations, will speak to hospitalist Dr. Sedalia Muta for admission, and she agrees to admit the patient.  Patient also seen and evaluated with Dr. Juliette Alcide who agrees with the patient's work-up at this time and agrees with hospital admission.      ____________________________________________   FINAL CLINICAL IMPRESSION(S) / ED DIAGNOSES  Final diagnoses:  COPD with acute exacerbation [facility])     ED Discharge Orders     None        Note:  This document was prepared using Dragon voice recognition software and may include unintentional dictation errors.    Lucy Chris, PA 07/03/21 1531    Arnaldo Natal, MD 07/03/21 [phone]

## 2021-07-03 NOTE — ED Triage Notes (Signed)
Pt comes with c/o cough. Pt states this has been  going on for about a month. Pt states she keeps coughing up stuff. Pt states she went to PCP and was given antibiotics. Pt states no relief.

## 2021-07-03 NOTE — ED Notes (Signed)
Pt able to ambulate down the hall while coughing and being short of breath. Lowest oxygen sat was 91% on RA but pt was noticeably working to breathe.

## 2021-07-03 NOTE — Telephone Encounter (Signed)
Pt verbalized understanding.

## 2021-07-03 NOTE — H&P (Signed)
History and Physical   [name] MGQ:[id number] DOB: 09-Jun-1961 DOA: 07/03/2021  PCP: Marjie Skiff, NP  Patient coming from: home  I have personally briefly reviewed patient's old medical records in [facility] EMR.  Chief Concern: Shortness of breath  HPI: Audrey Richard is a 60 y.o. female with medical history significant for COPD, mixed hyperlipidemia, depression, anxiety, hypothyroid, GERD, neuropathy, presents to the emergency department for chief concerns of shortness of breath.  She states that the shortness of breath has been ongoing for the last month.  She states that the shortness of breath is associated with cough that is productive of dark yellow thick and/or green thick mucus production.    Patient reported that she presented to her PCP who prescribed prednisone 20 mg tablet, Breztri inhaler, duo nebs, Tessalon Perles with minimal improvement.  She reports that she works as a Holiday representative at [employer] so she is exposed to hundreds of people per day, cough productive with yellow/green mucus. She endorses dysuria for 2-3 weeks. She endorse musculoskeletal anterior chest pain that is present with coughing and/or palpation.    Social history: She lives with her daughter at home. Smoked 1ppd and quit 30 years ago. She was exposed as a young child to second hand smoking and her husband smoked indoors when he was a live. She denies current tobacco, etoh, or recreational drug use.   Vaccination history: She is not vaccinated for covid 19  ROS: Constitutional: no weight change, no fever ENT/Mouth: no sore throat, no rhinorrhea Eyes: no eye pain, no vision changes Cardiovascular: no chest pain, + dyspnea,  no edema, no palpitations Respiratory: + cough, + sputum, no wheezing Gastrointestinal: no nausea, no vomiting, no diarrhea, no constipation Genitourinary: no urinary incontinence, no dysuria, no hematuria Musculoskeletal: no arthralgias, no myalgias Skin: no skin  lesions, no pruritus, Neuro: + weakness, no loss of consciousness, no syncope Psych: no anxiety, no depression, + decrease appetite Heme/Lymph: no bruising, no bleeding  ED Course: Discussed with emergency medicine provider, patient requiring hospitalization for COPD exacerbation.  Vitals in the emergency department was remarkable for temperature of 98.9, respiration rate of 18, heart rate 101, blood pressure 132/74, SPO2 of 94% on room air.  Labs in the emergency department was remarkable for sodium 137, potassium 3.7, chloride 104, bicarb 21, BUN of 10, serum creatinine of 0.88, nonfasting blood glucose 97, WBC 15.3, hemoglobin 13.2, platelets 248.  Troponin was 3.  COVID was negative.  EDP ordered duo nebs x3, Solu-Medrol 125 mg IV once, ceftriaxone 1 g IV once, doxycycline 100 mg IV once.  Assessment/Plan  Active Problems:   Depression   Insomnia   Stage 2 moderate COPD by GOLD classification (HCC)   Hypothyroidism   Mixed hyperlipidemia   Acid reflux   COPD exacerbation (HCC)   Shortness of breath-I suspect this is due to COPD exacerbation however given that patient also endorses shortness of breath and swelling worse with exertion - I ordered a BNP, if elevated would recommend primary hospitalist to consider echo prior to discharge - I will resume DuoNebs 4 times daily - Azithromycin IV daily for atypical bacterial and decreasing inflammation coverage - Check Pro-Cal, and if negative, this is unlikely to be pneumonia - Solu-Medrol 40 mg IV twice daily ordered  Dysuria-check UA - UA showed moderate leukocytes, appropriate squamous epithelial levels - Patient is status post ceftriaxone 1 g IV per EDP - Ceftriaxone 1 g IV continued for 07/04/2021  Hyperlipidemia-rosuvastatin 20 mg nightly resumed  Depression/anxiety-bupropion 150 mg daily, fluoxetine 20 mg daily  Insomnia-trazodone 25 mg as needed nightly for sleep ordered  Hypothyroid-levothyroxine 125 mcg daily before  breakfast ordered  GERD-PPI  Chart reviewed.   DVT prophylaxis: Enoxaparin 40 mg subcutaneous every 24 hours, TED hose Code Status: Full code Diet: Heart healthy Family Communication: No Disposition Plan: Pending clinical course Consults called: None at this time Admission status: MedSurg, observation, no telemetry  Past Medical History:  Diagnosis Date   Asthma    COPD (chronic obstructive pulmonary disease) (HCC)    Depression    High cholesterol    Osteoporosis    Thyroid disease    Past Surgical History:  Procedure Laterality Date   TUBAL LIGATION     Social History:  reports that she quit smoking about 15 years ago. Her smoking use included cigarettes. She has never used smokeless tobacco. She reports previous alcohol use. She reports that she does not use drugs.  Allergies  Allergen Reactions   Biaxin [Clarithromycin]     Patient reports itching, nausea   Iodine     Iodine blisters skin   Family History  Problem Relation Age of Onset   Heart disease Mother    Heart disease Father    Lung disease Father    Diabetes Sister    Diabetes Maternal Grandmother    Heart disease Maternal Grandmother    Diabetes Maternal Grandfather    Heart disease Maternal Grandfather    Diabetes Sister    Family history: Family history reviewed and not pertinent  Prior to Admission medications   Medication Sig Start Date End Date Taking? Authorizing Provider  albuterol (VENTOLIN HFA) 108 (90 Base) MCG/ACT inhaler Inhale 2 puffs into the lungs every 6 (six) hours as needed for wheezing or shortness of breath. 08/30/19   Cannady, Corrie Dandy T, NP  azelastine (OPTIVAR) 0.05 % ophthalmic solution Place 1 drop into both eyes 2 (two) times daily. 08/30/19   Cannady, Corrie Dandy T, NP  benzonatate (TESSALON PERLES) 100 MG capsule Take 1 capsule (100 mg total) by mouth 3 (three) times daily as needed for cough. 06/18/21   Cannady, Corrie Dandy T, NP  Budeson-Glycopyrrol-Formoterol (BREZTRI AEROSPHERE)  160-9-4.8 MCG/ACT AERO Inhale 2 puffs into the lungs 2 (two) times daily. 06/18/21   Cannady, Corrie Dandy T, NP  buPROPion (WELLBUTRIN XL) 150 MG 24 hr tablet TAKE ONE TABLET BY MOUTH EVERY DAY 05/14/21   Cannady, Jolene T, NP  cetirizine (ZYRTEC) 10 MG tablet TAKE ONE TABLET BY MOUTH EVERY DAY 05/14/21   Cannady, Corrie Dandy T, NP  Cholecalciferol 1.25 MG (50000 UT) TABS Take 1 tablet by mouth once a week. 05/15/21   Cannady, Corrie Dandy T, NP  cyclobenzaprine (FLEXERIL) 10 MG tablet Take 1 tablet (10 mg total) by mouth at bedtime. 05/14/21   Cannady, Corrie Dandy T, NP  FLUoxetine (PROZAC) 20 MG tablet Take 1 tablet (20 mg total) by mouth daily. 05/14/21   Cannady, Corrie Dandy T, NP  fluticasone (FLONASE) 50 MCG/ACT nasal spray PLACE 2 SPRAYS INTO BOTH NOSTRILS EVERY DAY 02/12/21 02/12/22  Iloabachie, Chioma E, NP  gabapentin (NEURONTIN) 300 MG capsule TAKE ONE CAPSULE BY MOUTH 3 TIMES A DAY 05/14/21   Cannady, Jolene T, NP  ipratropium-albuterol (DUONEB) 0.5-2.5 (3) MG/3ML SOLN Take 3 mLs by nebulization every 6 (six) hours as needed. 08/30/19   Cannady, Corrie Dandy T, NP  levothyroxine (SYNTHROID) 125 MCG tablet Take 1 tablet (125 mcg total) by mouth daily. 05/15/21   Cannady, Corrie Dandy T, NP  montelukast (SINGULAIR) 10 MG tablet  Take 1 tablet (10 mg total) by mouth at bedtime. 05/14/21   Cannady, Corrie Dandy T, NP  omeprazole (PRILOSEC) 40 MG capsule TAKE ONE CAPSULE BY MOUTH EVERY EVENING 05/14/21   Cannady, Jolene T, NP  rosuvastatin (CRESTOR) 20 MG tablet TAKE ONE TABLET BY MOUTH EVERY DAY 05/14/21   Cannady, Corrie Dandy T, NP  traZODone (DESYREL) 50 MG tablet TAKE ONE TABLET BY MOUTH AT BEDTIME AS NEEDED 05/14/21   Cannady, Corrie Dandy T, NP  vitamin B-12 (CYANOCOBALAMIN) 500 MCG tablet Take 1 tablet (500 mcg total) by mouth daily. 05/15/21   Aura Dials T, NP   Physical Exam: Vitals:   07/03/21 1109 07/03/21 1200 07/03/21 1230 07/03/21 1300  BP: (!) 138/114 124/71 121/71 120/74  Pulse: 85 91 91 89  Resp: (!) 24 17 (!) 22 (!) 24  Temp:       TempSrc:      SpO2: 96% 96% 95% 93%   Constitutional: appears age-appropriate, NAD, calm, comfortable Eyes: PERRL, lids and conjunctivae normal ENMT: Mucous membranes are moist. Posterior pharynx clear of any exudate or lesions. Age-appropriate dentition. Hearing appropriate Neck: normal, supple, no masses, no thyromegaly Respiratory: clear to auscultation bilaterally. + wheezing diffusely; no crackles. Normal respiratory effort. No accessory muscle use.  Cardiovascular: Regular rate and rhythm, no murmurs / rubs / gallops. No extremity edema. 2+ pedal pulses. No carotid bruits.  Abdomen: no tenderness, no masses palpated, no hepatosplenomegaly. Bowel sounds positive.  Musculoskeletal: no clubbing / cyanosis. No joint deformity upper and lower extremities. Good ROM, no contractures, no atrophy. Normal muscle tone.  Skin: no rashes, lesions, ulcers. No induration Neurologic: Sensation intact. Strength 5/5 in all 4.  Psychiatric: Normal judgment and insight. Alert and oriented x 3. Normal mood.   EKG: independently reviewed, showing with rate of 95, QTc 467  Chest x-ray on Admission: I personally reviewed and I agree with radiologist reading as below.  DG Chest 2 View  Result Date: 07/03/2021 CLINICAL DATA:  Cough EXAM: CHEST - 2 VIEW COMPARISON:  01/22/2021 FINDINGS: Bilateral chronic interstitial thickening. No focal consolidation. No pleural effusion or pneumothorax. Heart and mediastinal contours are unremarkable. No acute osseous abnormality. Old healed bilateral rib fractures. IMPRESSION: No active cardiopulmonary disease. Electronically Signed   By: Elige Ko   On: 07/03/2021 09:36    Labs on Admission: I have personally reviewed following labs  CBC: Recent Labs  Lab 07/03/21 1120  WBC 15.3*  NEUTROABS 13.0*  HGB 13.2  HCT 38.6  MCV 92.8  PLT 248   Basic Metabolic Panel: Recent Labs  Lab 07/03/21 1120  NA 137  K 3.7  CL 104  CO2 21*  GLUCOSE 97  BUN 10   CREATININE 0.88  CALCIUM 8.6*   Liver Function Tests: Recent Labs  Lab 07/03/21 1120  AST 25  ALT 31  ALKPHOS 49  BILITOT 0.9  PROT 7.1  ALBUMIN 3.4*   Urine analysis:    Component Value Date/Time   COLORURINE YELLOW (A) 12/30/2020 1500   APPEARANCEUR HAZY (A) 12/30/2020 1500   LABSPEC 1.012 12/30/2020 1500   PHURINE 6.0 12/30/2020 1500   GLUCOSEU NEGATIVE 12/30/2020 1500   HGBUR MODERATE (A) 12/30/2020 1500   BILIRUBINUR NEGATIVE 12/30/2020 1500   KETONESUR NEGATIVE 12/30/2020 1500   PROTEINUR NEGATIVE 12/30/2020 1500   NITRITE NEGATIVE 12/30/2020 1500   LEUKOCYTESUR SMALL (A) 12/30/2020 1500   Dr. Sedalia Muta [facility]  If 7PM-7AM, please contact overnight-coverage provider If 7AM-7PM, please contact day coverage provider [email or web address]  07/03/2021, 1:51 PM

## 2021-07-04 ENCOUNTER — Observation Stay
Admit: 2021-07-04 | Discharge: 2021-07-04 | Disposition: A | Discharge status: Home / Self Care | Payer: BC Managed Care – PPO | Attending: Internal Medicine | Admitting: Internal Medicine

## 2021-07-04 DIAGNOSIS — G629 Polyneuropathy, unspecified: Secondary | ICD-10-CM | POA: Diagnosis present

## 2021-07-04 DIAGNOSIS — Z20822 Contact with and (suspected) exposure to covid-19: Secondary | ICD-10-CM | POA: Diagnosis present

## 2021-07-04 DIAGNOSIS — M810 Age-related osteoporosis without current pathological fracture: Secondary | ICD-10-CM | POA: Diagnosis present

## 2021-07-04 DIAGNOSIS — Z79899 Other long term (current) drug therapy: Secondary | ICD-10-CM | POA: Diagnosis not present

## 2021-07-04 DIAGNOSIS — F419 Anxiety disorder, unspecified: Secondary | ICD-10-CM | POA: Diagnosis present

## 2021-07-04 DIAGNOSIS — N390 Urinary tract infection, site not specified: Secondary | ICD-10-CM | POA: Diagnosis present

## 2021-07-04 DIAGNOSIS — R059 Cough, unspecified: Secondary | ICD-10-CM | POA: Diagnosis present

## 2021-07-04 DIAGNOSIS — J441 Chronic obstructive pulmonary disease with (acute) exacerbation: Secondary | ICD-10-CM | POA: Diagnosis present

## 2021-07-04 DIAGNOSIS — E039 Hypothyroidism, unspecified: Secondary | ICD-10-CM | POA: Diagnosis present

## 2021-07-04 DIAGNOSIS — R609 Edema, unspecified: Secondary | ICD-10-CM | POA: Diagnosis present

## 2021-07-04 DIAGNOSIS — Z87891 Personal history of nicotine dependence: Secondary | ICD-10-CM | POA: Diagnosis not present

## 2021-07-04 DIAGNOSIS — Z79890 Hormone replacement therapy (postmenopausal): Secondary | ICD-10-CM | POA: Diagnosis not present

## 2021-07-04 DIAGNOSIS — J9601 Acute respiratory failure with hypoxia: Secondary | ICD-10-CM | POA: Diagnosis present

## 2021-07-04 DIAGNOSIS — E782 Mixed hyperlipidemia: Secondary | ICD-10-CM | POA: Diagnosis present

## 2021-07-04 DIAGNOSIS — Z28310 Unvaccinated for covid-19: Secondary | ICD-10-CM | POA: Diagnosis not present

## 2021-07-04 DIAGNOSIS — Z833 Family history of diabetes mellitus: Secondary | ICD-10-CM | POA: Diagnosis not present

## 2021-07-04 DIAGNOSIS — F32A Depression, unspecified: Secondary | ICD-10-CM | POA: Diagnosis present

## 2021-07-04 DIAGNOSIS — I5032 Chronic diastolic (congestive) heart failure: Secondary | ICD-10-CM | POA: Diagnosis present

## 2021-07-04 DIAGNOSIS — K219 Gastro-esophageal reflux disease without esophagitis: Secondary | ICD-10-CM | POA: Diagnosis present

## 2021-07-04 DIAGNOSIS — Z8249 Family history of ischemic heart disease and other diseases of the circulatory system: Secondary | ICD-10-CM | POA: Diagnosis not present

## 2021-07-04 DIAGNOSIS — N179 Acute kidney failure, unspecified: Secondary | ICD-10-CM | POA: Diagnosis present

## 2021-07-04 DIAGNOSIS — G4700 Insomnia, unspecified: Secondary | ICD-10-CM | POA: Diagnosis present

## 2021-07-04 LAB — CBC
HCT: 38 % (ref 36.0–46.0)
Hemoglobin: 12.7 g/dL (ref 12.0–15.0)
MCH: 31.4 pg (ref 26.0–34.0)
MCHC: 33.4 g/dL (ref 30.0–36.0)
MCV: 94.1 fL (ref 80.0–100.0)
Platelets: 254 10*3/uL (ref 150–400)
RBC: 4.04 MIL/uL (ref 3.87–5.11)
RDW: 12.9 % (ref 11.5–15.5)
WBC: 10.8 10*3/uL — ABNORMAL HIGH (ref 4.0–10.5)
nRBC: 0 % (ref 0.0–0.2)

## 2021-07-04 LAB — BASIC METABOLIC PANEL
Anion gap: 8 (ref 5–15)
BUN: 14 mg/dL (ref 6–20)
CO2: 23 mmol/L (ref 22–32)
Calcium: 9 mg/dL (ref 8.9–10.3)
Chloride: 108 mmol/L (ref 98–111)
Creatinine, Ser: 0.88 mg/dL (ref 0.44–1.00)
GFR, Estimated: >60 mL/min (ref >60)
Glucose, Bld: 142 mg/dL — ABNORMAL HIGH (ref 70–99)
Potassium: 4 mmol/L (ref 3.5–5.1)
Sodium: 139 mmol/L (ref 135–145)

## 2021-07-04 LAB — HIV ANTIBODY (ROUTINE TESTING W REFLEX): HIV Screen 4th Generation wRfx: NONREACTIVE

## 2021-07-04 MED ORDER — SENNOSIDES-DOCUSATE SODIUM 8.6-50 MG PO TABS
1.0000 | ORAL_TABLET | Freq: Every day | ORAL | Status: DC
  Administered 2021-07-04 – 2021-07-11 (×8): 1 via ORAL
  Filled 2021-07-04 (×8): qty 1

## 2021-07-04 MED ORDER — POLYETHYLENE GLYCOL 3350 17 G PO PACK
17.0000 g | PACK | Freq: Every day | ORAL | Status: DC | PRN
  Administered 2021-07-04: 09:00:00 17 g via ORAL
  Filled 2021-07-04: qty 1

## 2021-07-04 MED ORDER — IPRATROPIUM-ALBUTEROL 0.5-2.5 (3) MG/3ML IN SOLN
3.0000 mL | Freq: Three times a day (TID) | RESPIRATORY_TRACT | Status: DC
  Administered 2021-07-04 – 2021-07-12 (×25): 3 mL via RESPIRATORY_TRACT
  Filled 2021-07-04 (×24): qty 3

## 2021-07-04 MED ORDER — BENZONATATE 100 MG PO CAPS
200.0000 mg | ORAL_CAPSULE | Freq: Three times a day (TID) | ORAL | Status: DC
  Administered 2021-07-04 – 2021-07-12 (×25): 200 mg via ORAL
  Filled 2021-07-04 (×25): qty 2

## 2021-07-04 NOTE — Progress Notes (Signed)
PROGRESS NOTE  Audrey Richard  DOB: Dec 10, 1961  PCP: Marjie Skiff, NP ZOX:[id number]  DOA: 07/03/2021  LOS: 0 days  Hospital Day: 2   Chief Complaint  Patient presents with   Cough   Brief narrative: Audrey Richard is a 60 y.o. female with PMH significant for COPD, mixed hyperlipidemia, depression, anxiety, hypothyroid, GERD, neuropathy. Patient presented to the ED on 7/13 with complaint of shortness of breath, ongoing for the last month, associated with cough, dark thick sputum, not responding to outpatient treatment by PCP with prednisone, inhaler, Tessalon Perles Also mentions dysuria for 2 to 3 weeks. Not vaccinated for COVID-19  In the ED, temperature 98.9, respiratory 18, O2 sat 94% on room air, blood pressure stable. COVID PCR negative Labs unremarkable Given IV Rocephin, doxycycline, Solu-Medrol and DuoNeb with some improvement in symptoms. Kept in observation under hospitalist service  Subjective: Patient was seen and examined this morning.  Pleasant middle-aged Caucasian female.  Lying on bed.  Not taking distress. Has very muffled voice because of persistent cough.  Coughs violently on deep breathing.  Assessment/Plan: Acute exacerbation of COPD -Presented with a month of shortness of breath, productive cough   -Chest x-ray normal -Initial WC count elevated to 15.3, down to 10.8 this morning, procalcitonin normal -Currently on azithromycin, Solu-Medrol 40 mg IV twice daily and DuoNeb. -Seems to have significant amount of cough.  Currently on Precedex.  Will add benzonatate Recent Labs  Lab 07/03/21 1120 07/03/21 1352 07/04/21 0450  WBC 15.3*  --  10.8*  PROCALCITON  --  <0.10  --    UTI -Complain of dysuria.  Urinalysis showed moderate leukocytes.  Currently on IV Rocephin.  Pedal edema -Pending echocardiogram   Hyperlipidemia -rosuvastatin 20 mg nightly resumed  Depression/anxiety -bupropion 150 mg daily, fluoxetine 20 mg daily    Insomnia -trazodone 25 mg as needed nightly for sleep ordered  Hypothyroidism -levothyroxine 125 mcg daily before breakfast ordered  GERD -PPI   Chart reviewed. Mobility: Encourage ambulation Code Status:   Code Status: Full Code  Nutritional status: Body mass index is 30.18 kg/m.     Diet:  Diet Order             Diet Heart Room service appropriate? Yes; Fluid consistency: Thin  Diet effective now                  DVT prophylaxis:  enoxaparin (LOVENOX) injection 40 mg Start: 07/03/21 2200 Place TED hose Start: 07/03/21 1610   Antimicrobials: IV Rocephin, azithromycin Fluid: None Consultants: None Family Communication: None at bedside  Status is: Inpatient  Remains inpatient appropriate because: Needs IV antibiotics  Dispo: The patient is from: Home              Anticipated d/c is to: Hopefully home in 2 to 3 days              Patient currently is not medically stable to d/c.   Difficult to place patient No     Infusions:   azithromycin 500 mg (07/04/21 0920)   cefTRIAXone (ROCEPHIN)  IV 1 g (07/04/21 0830)    Scheduled Meds:  benzonatate  200 mg Oral TID   buPROPion  150 mg Oral Daily   enoxaparin (LOVENOX) injection  40 mg Subcutaneous Q24H   FLUoxetine  20 mg Oral Daily   gabapentin  300 mg Oral TID   ipratropium-albuterol  3 mL Nebulization Q6H   levothyroxine  125 mcg Oral Q0600   lidocaine  1 patch Transdermal Q24H   methylPREDNISolone (SOLU-MEDROL) injection  40 mg Intravenous Q12H   montelukast  10 mg Oral QHS   pantoprazole  80 mg Oral Daily   rosuvastatin  20 mg Oral QHS   senna-docusate  1 tablet Oral QHS    Antimicrobials: Anti-infectives (From admission, onward)    Start     Dose/Rate Route Frequency Ordered Stop   07/04/21 1000  azithromycin (ZITHROMAX) 500 mg in sodium chloride 0.9 % 250 mL IVPB        500 mg 250 mL/hr over 60 Minutes Intravenous Every 24 hours 07/03/21 1435     07/04/21 1000  cefTRIAXone (ROCEPHIN) 1 g in  sodium chloride 0.9 % 100 mL IVPB        1 g 200 mL/hr over 30 Minutes Intravenous Every 24 hours 07/03/21 1605 07/06/21 0959   07/03/21 1400  doxycycline (VIBRAMYCIN) 100 mg in sodium chloride 0.9 % 250 mL IVPB        100 mg 125 mL/hr over 120 Minutes Intravenous  Once 07/03/21 1321 07/03/21 1554   07/03/21 1330  cefTRIAXone (ROCEPHIN) 1 g in sodium chloride 0.9 % 100 mL IVPB        1 g 200 mL/hr over 30 Minutes Intravenous  Once 07/03/21 1321 07/03/21 1358       PRN meds: acetaminophen **OR** acetaminophen, albuterol, chlorpheniramine-HYDROcodone, dextromethorphan-guaiFENesin, ondansetron **OR** ondansetron (ZOFRAN) IV, polyethylene glycol, traZODone   Objective: Vitals:   07/04/21 0751 07/04/21 0802  BP:  106/68  Pulse:  77  Resp:  18  Temp:  97.8 F (36.6 C)  SpO2: 97% 97%    Intake/Output Summary (Last 24 hours) at 07/04/2021 1048 Last data filed at 07/04/2021 1023 Gross per 24 hour  Intake 700 ml  Output --  Net 700 ml   Filed Weights   07/03/21 2137  Weight: 87.4 kg   Weight change:  Body mass index is 30.18 kg/m.   Physical Exam: General exam: Pleasant, middle-aged Caucasian female. Skin: No rashes, lesions or ulcers. HEENT: Atraumatic, normocephalic, no obvious bleeding Lungs: Has muffled voice because of persistent cough.  Consequently decreasing. CVS: Regular rate and rhythm, no murmur GI/Abd soft, nontender, nondistended, bowel sound present CNS: Alert, awake, oriented x3 Psychiatry: Mood appropriate Extremities: Trace bilateral pedal edema, no calf tenderness  Data Review: I have personally reviewed the laboratory data and studies available.  Recent Labs  Lab 07/03/21 1120 07/04/21 0450  WBC 15.3* 10.8*  NEUTROABS 13.0*  --   HGB 13.2 12.7  HCT 38.6 38.0  MCV 92.8 94.1  PLT 248 254   Recent Labs  Lab 07/03/21 1120 07/04/21 0450  NA 137 139  K 3.7 4.0  CL 104 108  CO2 21* 23  GLUCOSE 97 142*  BUN 10 14  CREATININE 0.88 0.88   CALCIUM 8.6* 9.0    F/u labs ordered Unresulted Labs (From admission, onward)     Start     Ordered   07/05/21 0500  Basic metabolic panel  Daily,   R      07/04/21 1048   07/05/21 0500  CBC with Differential/Platelet  Daily,   R      07/04/21 1048   07/05/21 0500  Magnesium  Tomorrow morning,   R        07/04/21 1048   07/05/21 0500  Phosphorus  Tomorrow morning,   R        07/04/21 1048   07/03/21 1610  HIV Antibody (routine testing w rflx)  (  HIV Antibody (Routine testing w reflex) panel)  Once,   STAT        07/03/21 1610            Signed, Lorin Glass, MD [facility] 07/04/2021

## 2021-07-04 NOTE — Progress Notes (Signed)
*  PRELIMINARY RESULTS* Echocardiogram 2D Echocardiogram has been performed.  Audrey Richard 07/04/2021, 1:15 PM

## 2021-07-05 LAB — ECHOCARDIOGRAM COMPLETE
AR max vel: 3.04 cm2
AV Area VTI: 3.64 cm2
AV Area mean vel: 3.14 cm2
AV Mean grad: 3 mmHg
AV Peak grad: 6.1 mmHg
Ao pk vel: 1.23 m/s
Area-P 1/2: 4.99 cm2
Height: 67 in
S' Lateral: 2.67 cm
Weight: 3082.91 oz

## 2021-07-05 LAB — CBC WITH DIFFERENTIAL/PLATELET
Abs Immature Granulocytes: 0.05 10*3/uL (ref 0.00–0.07)
Basophils Absolute: 0 10*3/uL (ref 0.0–0.1)
Basophils Relative: 0 %
Eosinophils Absolute: 0 10*3/uL (ref 0.0–0.5)
Eosinophils Relative: 0 %
HCT: 40.7 % (ref 36.0–46.0)
Hemoglobin: 13.2 g/dL (ref 12.0–15.0)
Immature Granulocytes: 0 %
Lymphocytes Relative: 6 %
Lymphs Abs: 0.7 10*3/uL (ref 0.7–4.0)
MCH: 31.2 pg (ref 26.0–34.0)
MCHC: 32.4 g/dL (ref 30.0–36.0)
MCV: 96.2 fL (ref 80.0–100.0)
Monocytes Absolute: 0.2 10*3/uL (ref 0.1–1.0)
Monocytes Relative: 2 %
Neutro Abs: 10.8 10*3/uL — ABNORMAL HIGH (ref 1.7–7.7)
Neutrophils Relative %: 92 %
Platelets: 257 10*3/uL (ref 150–400)
RBC: 4.23 MIL/uL (ref 3.87–5.11)
RDW: 13.1 % (ref 11.5–15.5)
WBC: 11.8 10*3/uL — ABNORMAL HIGH (ref 4.0–10.5)
nRBC: 0 % (ref 0.0–0.2)

## 2021-07-05 LAB — BASIC METABOLIC PANEL
Anion gap: 8 (ref 5–15)
BUN: 20 mg/dL (ref 6–20)
CO2: 25 mmol/L (ref 22–32)
Calcium: 9 mg/dL (ref 8.9–10.3)
Chloride: 106 mmol/L (ref 98–111)
Creatinine, Ser: 0.95 mg/dL (ref 0.44–1.00)
GFR, Estimated: >60 mL/min (ref >60)
Glucose, Bld: 149 mg/dL — ABNORMAL HIGH (ref 70–99)
Potassium: 4.8 mmol/L (ref 3.5–5.1)
Sodium: 139 mmol/L (ref 135–145)

## 2021-07-05 LAB — MAGNESIUM: Magnesium: 2.3 mg/dL (ref 1.7–2.4)

## 2021-07-05 LAB — PHOSPHORUS: Phosphorus: 4.1 mg/dL (ref 2.5–4.6)

## 2021-07-05 MED ORDER — AZITHROMYCIN 500 MG PO TABS
500.0000 mg | ORAL_TABLET | Freq: Every day | ORAL | Status: AC
  Administered 2021-07-06 – 2021-07-07 (×2): 500 mg via ORAL
  Filled 2021-07-05 (×2): qty 1

## 2021-07-05 MED ORDER — HYDROCOD POLST-CPM POLST ER 10-8 MG/5ML PO SUER
5.0000 mL | Freq: Two times a day (BID) | ORAL | Status: DC
  Administered 2021-07-05 – 2021-07-11 (×12): 5 mL via ORAL
  Filled 2021-07-05 (×12): qty 5

## 2021-07-05 NOTE — Progress Notes (Signed)
PROGRESS NOTE  KORALEE WEDEKING  DOB: [birth year]  PCP: Marjie Skiff, NP PYK:[id number]  DOA: 07/03/2021  LOS: 1 day  Hospital Day: 3   Chief Complaint  Patient presents with   Cough   Brief narrative: Audrey Richard is a 60 y.o. female with PMH significant for COPD, mixed hyperlipidemia, depression, anxiety, hypothyroid, GERD, neuropathy. Patient presented to the ED on 7/13 with complaint of shortness of breath, ongoing for the last month, associated with cough, dark thick sputum, not responding to outpatient treatment by PCP with prednisone, inhaler, Tessalon Perles Also mentions dysuria for 2 to 3 weeks. Not vaccinated for COVID-19  In the ED, temperature 98.9, respiratory 18, O2 sat 94% on room air, blood pressure stable. COVID PCR negative Labs unremarkable Given IV Rocephin, doxycycline, Solu-Medrol and DuoNeb with some improvement in symptoms. Kept in observation under hospitalist service  Subjective: Patient was seen and examined this morning.   Propped up in bed.  Not in distress.  Still coughing but seems to be improving.  On 3 L oxygen by nasal cannula.    Assessment/Plan: Acute exacerbation of COPD Acute respiratory failure with hypoxia -Presented with a month of shortness of breath, productive cough   -Chest x-ray normal, initial WBC count normal, procalcitonin level normal. -Suspect atypical bacterial versus viral pneumonia. -Currently on azithromycin, Solu-Medrol 40 mg IV twice daily and DuoNeb. -Cough somewhat improving on Tessalon Perles.  Also add scheduled Tussionex.  -Encourage incentive spirometry. Recent Labs  Lab 07/03/21 1120 07/03/21 1352 07/04/21 0450 07/05/21 0427  WBC 15.3*  --  10.8* 11.8*  PROCALCITON  --  <0.10  --   --     UTI -Complain of dysuria.  Urinalysis showed moderate leukocytes.  Currently on IV Rocephin.  Combined systolic and diastolic CHF CHF -Echocardiogram 7/14 showed EF of 50 to 55% no regional wall motion  abnormality, grade 1 diastolic dysfunction, normal right ventricular size and systolic function. -Currently CHF seems compensated.  Not fluid overloaded, not requiring diuretics.   Hyperlipidemia -rosuvastatin 20 mg nightly resumed  Depression/anxiety -bupropion 150 mg daily, fluoxetine 20 mg daily   Insomnia -trazodone 25 mg as needed nightly for sleep ordered  Hypothyroidism -levothyroxine 125 mcg daily before breakfast ordered  GERD -PPI   Chart reviewed. Mobility: Encourage ambulation Code Status:   Code Status: Full Code  Nutritional status: Body mass index is 30.18 kg/m.     Diet:  Diet Order             Diet Heart Room service appropriate? Yes; Fluid consistency: Thin  Diet effective now                  DVT prophylaxis:  enoxaparin (LOVENOX) injection 40 mg Start: 07/03/21 2200 Place TED hose Start: 07/03/21 1610   Antimicrobials: IV Rocephin, azithromycin Fluid: None Consultants: None Family Communication: None at bedside  Status is: Inpatient  Remains inpatient appropriate because: Needs IV antibiotics  Dispo: The patient is from: Home              Anticipated d/c is to: Hopefully home in 1 to 2 days.              Patient currently is not medically stable to d/c.   Difficult to place patient No     Infusions:   azithromycin 500 mg (07/05/21 0953)    Scheduled Meds:  benzonatate  200 mg Oral TID   buPROPion  150 mg Oral Daily   enoxaparin (LOVENOX) injection  40 mg Subcutaneous Q24H   FLUoxetine  20 mg Oral Daily   gabapentin  300 mg Oral TID   ipratropium-albuterol  3 mL Nebulization TID   levothyroxine  125 mcg Oral Q0600   lidocaine  1 patch Transdermal Q24H   methylPREDNISolone (SOLU-MEDROL) injection  40 mg Intravenous Q12H   montelukast  10 mg Oral QHS   pantoprazole  80 mg Oral Daily   rosuvastatin  20 mg Oral QHS   senna-docusate  1 tablet Oral QHS    Antimicrobials: Anti-infectives (From admission, onward)    Start      Dose/Rate Route Frequency Ordered Stop   07/04/21 1000  azithromycin (ZITHROMAX) 500 mg in sodium chloride 0.9 % 250 mL IVPB        500 mg 250 mL/hr over 60 Minutes Intravenous Every 24 hours 07/03/21 1435     07/04/21 1000  cefTRIAXone (ROCEPHIN) 1 g in sodium chloride 0.9 % 100 mL IVPB        1 g 200 mL/hr over 30 Minutes Intravenous Every 24 hours 07/03/21 1605 07/05/21 0942   07/03/21 1400  doxycycline (VIBRAMYCIN) 100 mg in sodium chloride 0.9 % 250 mL IVPB        100 mg 125 mL/hr over 120 Minutes Intravenous  Once 07/03/21 1321 07/03/21 1554   07/03/21 1330  cefTRIAXone (ROCEPHIN) 1 g in sodium chloride 0.9 % 100 mL IVPB        1 g 200 mL/hr over 30 Minutes Intravenous  Once 07/03/21 1321 07/03/21 1358       PRN meds: acetaminophen **OR** acetaminophen, albuterol, chlorpheniramine-HYDROcodone, dextromethorphan-guaiFENesin, ondansetron **OR** ondansetron (ZOFRAN) IV, polyethylene glycol, traZODone   Objective: Vitals:   07/05/21 0846 07/05/21 0900  BP: 100/87   Pulse: 80   Resp: 18   Temp: 97.8 F (36.6 C)   SpO2: 98% 96%    Intake/Output Summary (Last 24 hours) at 07/05/2021 1014 Last data filed at 07/04/2021 1523 Gross per 24 hour  Intake 950 ml  Output --  Net 950 ml    Filed Weights   07/03/21 2137  Weight: 87.4 kg   Weight change:  Body mass index is 30.18 kg/m.   Physical Exam: General exam: Pleasant, middle-aged Caucasian female. Skin: No rashes, lesions or ulcers. HEENT: Atraumatic, normocephalic, no obvious bleeding Lungs: No crackles or wheezing.  Violent cough on deep breathing. CVS: Regular rate and rhythm, no murmur GI/Abd soft, nontender, nondistended, bowel sound present CNS: Alert, awake, oriented x3 Psychiatry: Mood appropriate Extremities: Trace bilateral pedal edema, no calf tenderness  Data Review: I have personally reviewed the laboratory data and studies available.  Recent Labs  Lab 07/03/21 1120 07/04/21 0450 07/05/21 0427   WBC 15.3* 10.8* 11.8*  NEUTROABS 13.0*  --  10.8*  HGB 13.2 12.7 13.2  HCT 38.6 38.0 40.7  MCV 92.8 94.1 96.2  PLT 248 254 257    Recent Labs  Lab 07/03/21 1120 07/04/21 0450 07/05/21 0427  NA 137 139 139  K 3.7 4.0 4.8  CL 104 108 106  CO2 21* 23 25  GLUCOSE 97 142* 149*  BUN 10 14 20   CREATININE 0.88 0.88 0.95  CALCIUM 8.6* 9.0 9.0  MG  --   --  2.3  PHOS  --   --  4.1     F/u labs ordered Unresulted Labs (From admission, onward)     Start     Ordered   07/05/21 0500  Basic metabolic panel  Daily,   R  07/04/21 1048   07/05/21 0500  CBC with Differential/Platelet  Daily,   R      07/04/21 1048            Signed, Lorin Glass, MD [facility] 07/05/2021

## 2021-07-05 NOTE — Progress Notes (Signed)
PHARMACIST - PHYSICIAN COMMUNICATION DR:   Pola Corn CONCERNING: Antibiotic IV to Oral Route Change Policy  RECOMMENDATION: This patient is receiving azithromycin by the intravenous route.  Based on criteria approved by the Pharmacy and Therapeutics Committee, the antibiotic(s) is/are being converted to the equivalent oral dose form(s).   DESCRIPTION: These criteria include: Patient being treated for a respiratory tract infection, urinary tract infection, cellulitis or clostridium difficile associated diarrhea if on metronidazole The patient is not neutropenic and does not exhibit a GI malabsorption state The patient is eating (either orally or via tube) and/or has been taking other orally administered medications for a least 24 hours The patient is improving clinically and has a Tmax < 100.5  If you have questions about this conversion, please contact the Pharmacy Department  []   [phone] )  ( [phone] [x]   [phone] )  [facility] []   [phone] )  [facility] []   [phone] )  [facility] []   [phone] )  [facility]

## 2021-07-06 LAB — BASIC METABOLIC PANEL
Anion gap: 10 (ref 5–15)
BUN: 24 mg/dL — ABNORMAL HIGH (ref 6–20)
CO2: 27 mmol/L (ref 22–32)
Calcium: 9 mg/dL (ref 8.9–10.3)
Chloride: 103 mmol/L (ref 98–111)
Creatinine, Ser: 1.04 mg/dL — ABNORMAL HIGH (ref 0.44–1.00)
GFR, Estimated: >60 mL/min (ref >60)
Glucose, Bld: 111 mg/dL — ABNORMAL HIGH (ref 70–99)
Potassium: 4.3 mmol/L (ref 3.5–5.1)
Sodium: 140 mmol/L (ref 135–145)

## 2021-07-06 LAB — CBC WITH DIFFERENTIAL/PLATELET
Abs Immature Granulocytes: 0.08 10*3/uL — ABNORMAL HIGH (ref 0.00–0.07)
Basophils Absolute: 0 10*3/uL (ref 0.0–0.1)
Basophils Relative: 0 %
Eosinophils Absolute: 0 10*3/uL (ref 0.0–0.5)
Eosinophils Relative: 0 %
HCT: 38.7 % (ref 36.0–46.0)
Hemoglobin: 12.7 g/dL (ref 12.0–15.0)
Immature Granulocytes: 1 %
Lymphocytes Relative: 7 %
Lymphs Abs: 0.8 10*3/uL (ref 0.7–4.0)
MCH: 31.3 pg (ref 26.0–34.0)
MCHC: 32.8 g/dL (ref 30.0–36.0)
MCV: 95.3 fL (ref 80.0–100.0)
Monocytes Absolute: 0.4 10*3/uL (ref 0.1–1.0)
Monocytes Relative: 3 %
Neutro Abs: 10.1 10*3/uL — ABNORMAL HIGH (ref 1.7–7.7)
Neutrophils Relative %: 89 %
Platelets: 279 10*3/uL (ref 150–400)
RBC: 4.06 MIL/uL (ref 3.87–5.11)
RDW: 13 % (ref 11.5–15.5)
WBC: 11.3 10*3/uL — ABNORMAL HIGH (ref 4.0–10.5)
nRBC: 0 % (ref 0.0–0.2)

## 2021-07-06 LAB — CULTURE, RESPIRATORY W GRAM STAIN: Culture: NORMAL

## 2021-07-06 MED ORDER — LORATADINE 10 MG PO TABS
10.0000 mg | ORAL_TABLET | Freq: Every day | ORAL | Status: DC
  Administered 2021-07-07 – 2021-07-12 (×6): 10 mg via ORAL
  Filled 2021-07-06 (×6): qty 1

## 2021-07-06 MED ORDER — FLUTICASONE PROPIONATE 50 MCG/ACT NA SUSP
2.0000 | Freq: Every day | NASAL | Status: DC
  Administered 2021-07-06 – 2021-07-12 (×7): 2 via NASAL
  Filled 2021-07-06: qty 16

## 2021-07-06 MED ORDER — FLUCONAZOLE 50 MG PO TABS
150.0000 mg | ORAL_TABLET | Freq: Once | ORAL | Status: AC
  Administered 2021-07-06: 150 mg via ORAL
  Filled 2021-07-06: qty 1

## 2021-07-06 NOTE — Plan of Care (Signed)
  Problem: Clinical Measurements: Goal: Respiratory complications will improve Note: Continues cough and deep breathing, incentive spirometer use, prn mucinex administered x 2, along with routine cough medication, small amount of sputum noted with forceful cough

## 2021-07-06 NOTE — Progress Notes (Signed)
PROGRESS NOTE  Audrey Richard  DOB: 1961/01/24  PCP: Marjie Skiff, NP BCW:[id number]  DOA: 07/03/2021  LOS: 2 days  Hospital Day: 4   Chief Complaint  Patient presents with   Cough   Brief narrative: Audrey Richard is a 60 y.o. female with PMH significant for COPD, mixed hyperlipidemia, depression, anxiety, hypothyroid, GERD, neuropathy. Patient presented to the ED on 7/13 with complaint of shortness of breath, ongoing for the last month, associated with cough, dark thick sputum, not responding to outpatient treatment by PCP with prednisone, inhaler, Tessalon Perles Also mentions dysuria for 2 to 3 weeks. Not vaccinated for COVID-19  In the ED, temperature 98.9, respiratory 18, O2 sat 94% on room air, blood pressure stable. COVID PCR negative Labs unremarkable Given IV Rocephin, doxycycline, Solu-Medrol and DuoNeb with some improvement in symptoms. Kept in observation under hospitalist service  Subjective: Patient was seen and examined this morning.   Continues to have persistent cough, could not sleep well last night. Remains on 3 L oxygen.  Assessment/Plan: Acute exacerbation of COPD Acute respiratory failure with hypoxia -Presented with a month of shortness of breath, productive cough   -Chest x-ray normal, initial WBC count normal, procalcitonin level normal. -Suspect atypical bacterial versus viral pneumonia. -Currently on azithromycin, Solu-Medrol 40 mg IV twice daily and DuoNeb. -Continues to have persistent cough despite Tessalon Perles, and Tussionex.  We will add Claritin and Flonase. -Encourage incentive spirometry. Recent Labs  Lab 07/03/21 1120 07/03/21 1352 07/04/21 0450 07/05/21 0427 07/06/21 0539  WBC 15.3*  --  10.8* 11.8* 11.3*  PROCALCITON  --  <0.10  --   --   --     UTI -Complain of dysuria.  Urinalysis showed moderate leukocytes.  Currently on IV Rocephin.  Combined systolic and diastolic CHF CHF -Echocardiogram 7/14 showed EF of  50 to 55% no regional wall motion abnormality, grade 1 diastolic dysfunction, normal right ventricular size and systolic function. -Currently CHF seems compensated.  Not fluid overloaded, not requiring diuretics.   Hyperlipidemia -rosuvastatin 20 mg nightly resumed  Depression/anxiety -bupropion 150 mg daily, fluoxetine 20 mg daily   Insomnia -trazodone 25 mg as needed nightly for sleep ordered  Hypothyroidism -levothyroxine 125 mcg daily before breakfast ordered  GERD -PPI   Chart reviewed. Mobility: Encourage ambulation Code Status:   Code Status: Full Code  Nutritional status: Body mass index is 30.18 kg/m.     Diet:  Diet Order             Diet Heart Room service appropriate? Yes; Fluid consistency: Thin  Diet effective now                  DVT prophylaxis:  enoxaparin (LOVENOX) injection 40 mg Start: 07/03/21 2200 Place TED hose Start: 07/03/21 1610   Antimicrobials: IV Rocephin, azithromycin Fluid: None Consultants: None Family Communication: None at bedside  Status is: Inpatient  Remains inpatient appropriate because: Continues of persistent cough, oxygen dependence.  Dispo: The patient is from: Home              Anticipated d/c is to: Hopefully home in 1 to 2 days.              Patient currently is not medically stable to d/c.   Difficult to place patient No     Infusions:     Scheduled Meds:  azithromycin  500 mg Oral Daily   benzonatate  200 mg Oral TID   buPROPion  150 mg Oral  Daily   chlorpheniramine-HYDROcodone  5 mL Oral Q12H   enoxaparin (LOVENOX) injection  40 mg Subcutaneous Q24H   FLUoxetine  20 mg Oral Daily   gabapentin  300 mg Oral TID   ipratropium-albuterol  3 mL Nebulization TID   levothyroxine  125 mcg Oral Q0600   lidocaine  1 patch Transdermal Q24H   montelukast  10 mg Oral QHS   pantoprazole  80 mg Oral Daily   rosuvastatin  20 mg Oral QHS   senna-docusate  1 tablet Oral QHS    Antimicrobials: Anti-infectives  (From admission, onward)    Start     Dose/Rate Route Frequency Ordered Stop   07/06/21 1000  azithromycin (ZITHROMAX) tablet 500 mg        500 mg Oral Daily 07/05/21 1403 07/08/21 0959   07/06/21 1000  fluconazole (DIFLUCAN) tablet 150 mg        150 mg Oral  Once 07/06/21 0908 07/06/21 1045   07/04/21 1000  azithromycin (ZITHROMAX) 500 mg in sodium chloride 0.9 % 250 mL IVPB  Status:  Discontinued        500 mg 250 mL/hr over 60 Minutes Intravenous Every 24 hours 07/03/21 1435 07/05/21 1403   07/04/21 1000  cefTRIAXone (ROCEPHIN) 1 g in sodium chloride 0.9 % 100 mL IVPB        1 g 200 mL/hr over 30 Minutes Intravenous Every 24 hours 07/03/21 1605 07/05/21 0942   07/03/21 1400  doxycycline (VIBRAMYCIN) 100 mg in sodium chloride 0.9 % 250 mL IVPB        100 mg 125 mL/hr over 120 Minutes Intravenous  Once 07/03/21 1321 07/03/21 1554   07/03/21 1330  cefTRIAXone (ROCEPHIN) 1 g in sodium chloride 0.9 % 100 mL IVPB        1 g 200 mL/hr over 30 Minutes Intravenous  Once 07/03/21 1321 07/03/21 1358       PRN meds: acetaminophen **OR** acetaminophen, dextromethorphan-guaiFENesin, ondansetron **OR** ondansetron (ZOFRAN) IV, polyethylene glycol, traZODone   Objective: Vitals:   07/06/21 0610 07/06/21 0756  BP: 107/84   Pulse: 89   Resp: 18   Temp: 98.2 F (36.8 C)   SpO2: 100% 98%    Intake/Output Summary (Last 24 hours) at 07/06/2021 1158 Last data filed at 07/06/2021 1006 Gross per 24 hour  Intake 730 ml  Output --  Net 730 ml    Filed Weights   07/03/21 2137  Weight: 87.4 kg   Weight change:  Body mass index is 30.18 kg/m.   Physical Exam: General exam: Pleasant, middle-aged Caucasian female.  In distress.  The persistent cough Skin: No rashes, lesions or ulcers. HEENT: Atraumatic, normocephalic, no obvious bleeding Lungs: No crackles or wheezing.  Continues to have violent cough on deep breathing. CVS: Regular rate and rhythm, no murmur GI/Abd soft, nontender,  nondistended, bowel sound present CNS: Alert, awake, oriented x3 Psychiatry: Depressed look Extremities: Trace bilateral pedal edema, no calf tenderness  Data Review: I have personally reviewed the laboratory data and studies available.  Recent Labs  Lab 07/03/21 1120 07/04/21 0450 07/05/21 0427 07/06/21 0539  WBC 15.3* 10.8* 11.8* 11.3*  NEUTROABS 13.0*  --  10.8* 10.1*  HGB 13.2 12.7 13.2 12.7  HCT 38.6 38.0 40.7 38.7  MCV 92.8 94.1 96.2 95.3  PLT 248 254 257 279    Recent Labs  Lab 07/03/21 1120 07/04/21 0450 07/05/21 0427 07/06/21 0539  NA 137 139 139 140  K 3.7 4.0 4.8 4.3  CL 104 108  106 103  CO2 21* 23 25 27   GLUCOSE 97 142* 149* 111*  BUN 10 14 20  24*  CREATININE 0.88 0.88 0.95 1.04*  CALCIUM 8.6* 9.0 9.0 9.0  MG  --   --  2.3  --   PHOS  --   --  4.1  --      F/u labs ordered Unresulted Labs (From admission, onward)     Start     Ordered   07/05/21 0500  Basic metabolic panel  Daily,   R      07/04/21 1048   07/05/21 0500  CBC with Differential/Platelet  Daily,   R      07/04/21 1048            Signed, 07/07/21, MD [facility] 07/06/2021

## 2021-07-07 LAB — CBC WITH DIFFERENTIAL/PLATELET
Abs Immature Granulocytes: 0.1 10*3/uL — ABNORMAL HIGH (ref 0.00–0.07)
Basophils Absolute: 0 10*3/uL (ref 0.0–0.1)
Basophils Relative: 0 %
Eosinophils Absolute: 0.1 10*3/uL (ref 0.0–0.5)
Eosinophils Relative: 1 %
HCT: 39.1 % (ref 36.0–46.0)
Hemoglobin: 12.9 g/dL (ref 12.0–15.0)
Immature Granulocytes: 1 %
Lymphocytes Relative: 24 %
Lymphs Abs: 1.9 10*3/uL (ref 0.7–4.0)
MCH: 31.4 pg (ref 26.0–34.0)
MCHC: 33 g/dL (ref 30.0–36.0)
MCV: 95.1 fL (ref 80.0–100.0)
Monocytes Absolute: 0.6 10*3/uL (ref 0.1–1.0)
Monocytes Relative: 7 %
Neutro Abs: 5.2 10*3/uL (ref 1.7–7.7)
Neutrophils Relative %: 67 %
Platelets: 263 10*3/uL (ref 150–400)
RBC: 4.11 MIL/uL (ref 3.87–5.11)
RDW: 13 % (ref 11.5–15.5)
WBC: 7.9 10*3/uL (ref 4.0–10.5)
nRBC: 0 % (ref 0.0–0.2)

## 2021-07-07 LAB — BASIC METABOLIC PANEL
Anion gap: 6 (ref 5–15)
BUN: 23 mg/dL — ABNORMAL HIGH (ref 6–20)
CO2: 30 mmol/L (ref 22–32)
Calcium: 8.6 mg/dL — ABNORMAL LOW (ref 8.9–10.3)
Chloride: 105 mmol/L (ref 98–111)
Creatinine, Ser: 1.18 mg/dL — ABNORMAL HIGH (ref 0.44–1.00)
GFR, Estimated: 53 mL/min — ABNORMAL LOW (ref >60)
Glucose, Bld: 99 mg/dL (ref 70–99)
Potassium: 4.1 mmol/L (ref 3.5–5.1)
Sodium: 141 mmol/L (ref 135–145)

## 2021-07-07 MED ORDER — PREDNISONE 20 MG PO TABS
40.0000 mg | ORAL_TABLET | Freq: Every day | ORAL | Status: DC
  Administered 2021-07-07 – 2021-07-10 (×4): 40 mg via ORAL
  Filled 2021-07-07 (×4): qty 2

## 2021-07-07 MED ORDER — TRAZODONE HCL 50 MG PO TABS
50.0000 mg | ORAL_TABLET | Freq: Every day | ORAL | Status: DC
  Administered 2021-07-07 – 2021-07-11 (×5): 50 mg via ORAL
  Filled 2021-07-07 (×6): qty 1

## 2021-07-07 MED ORDER — GUAIFENESIN-DM 100-10 MG/5ML PO SYRP
5.0000 mL | ORAL_SOLUTION | ORAL | Status: DC | PRN
  Administered 2021-07-07 – 2021-07-11 (×7): 5 mL via ORAL
  Filled 2021-07-07 (×7): qty 5

## 2021-07-07 MED ORDER — ACETYLCYSTEINE 20 % IN SOLN
4.0000 mL | Freq: Two times a day (BID) | RESPIRATORY_TRACT | Status: DC
  Administered 2021-07-07 – 2021-07-08 (×3): 4 mL via RESPIRATORY_TRACT
  Filled 2021-07-07 (×3): qty 4

## 2021-07-07 NOTE — Progress Notes (Signed)
PROGRESS NOTE  Audrey Richard  DOB: 1961/02/11  PCP: Marjie Skiff, NP DTO:[id number]  DOA: 07/03/2021  LOS: 3 days  Hospital Day: 5   Chief Complaint  Patient presents with   Cough   Brief narrative: Audrey Richard is a 60 y.o. female with PMH significant for COPD, mixed hyperlipidemia, depression, anxiety, hypothyroid, GERD, neuropathy. Patient presented to the ED on 7/13 with complaint of shortness of breath, ongoing for the last month, associated with cough, dark thick sputum, not responding to outpatient treatment by PCP with prednisone, inhaler, Tessalon Perles Also mentions dysuria for 2 to 3 weeks. Not vaccinated for COVID-19  In the ED, temperature 98.9, respiratory 18, O2 sat 94% on room air, blood pressure stable. COVID PCR negative Labs unremarkable Given IV Rocephin, doxycycline, Solu-Medrol and DuoNeb with some improvement in symptoms. Kept in observation under hospitalist service  Subjective: Patient was seen and examined this morning.   Only minimal improvement in cough despite multiple medications. Minimal sleep in the night.  Assessment/Plan: Acute exacerbation of COPD Acute respiratory failure with hypoxia -Presented with a month of shortness of breath, productive cough   -Chest x-ray normal, initial WBC count normal, procalcitonin level normal. -Suspect atypical bacterial versus viral pneumonia. -Currently on azithromycin for 5 days, prednisone 40 mg daily and DuoNeb. -Continues to have persistent cough despite Tessalon Perles, Tussionex, Claritin and Flonase.  Added Mucomyst today. -Encourage incentive spirometry. Recent Labs  Lab 07/03/21 1120 07/03/21 1352 07/04/21 0450 07/05/21 0427 07/06/21 0539 07/07/21 0453  WBC 15.3*  --  10.8* 11.8* 11.3* 7.9  PROCALCITON  --  <0.10  --   --   --   --     UTI -Complain of dysuria.  Urinalysis showed moderate leukocytes.  Completed 3-day course of IV Rocephin.  Combined systolic and diastolic  CHF CHF -Echocardiogram 7/14 showed EF of 50 to 55% no regional wall motion abnormality, grade 1 diastolic dysfunction, normal right ventricular size and systolic function. -Currently CHF seems compensated.  Not fluid overloaded, not requiring diuretics.   Hyperlipidemia -rosuvastatin 20 mg nightly resumed  Depression/anxiety -bupropion 150 mg daily, fluoxetine 20 mg daily   Insomnia - Probably because of persistent cough.  Trazodone 50 mg nightly scheduled.  Hypothyroidism -levothyroxine 125 mcg daily before breakfast ordered  GERD -PPI   Chart reviewed. Mobility: Encourage ambulation Code Status:   Code Status: Full Code  Nutritional status: Body mass index is 30.18 kg/m.     Diet:  Diet Order             Diet Heart Room service appropriate? Yes; Fluid consistency: Thin  Diet effective now                  DVT prophylaxis:  enoxaparin (LOVENOX) injection 40 mg Start: 07/03/21 2200 Place TED hose Start: 07/03/21 1610   Antimicrobials: IV Rocephin, azithromycin Fluid: None Consultants: None Family Communication: None at bedside  Status is: Inpatient  Remains inpatient appropriate because: Continues of persistent cough  Dispo: The patient is from: Home              Anticipated d/c is to: Unable to discharge because of persistent cough hopefully home in 1 to 2 days.              Patient currently is not medically stable to d/c.   Difficult to place patient No     Infusions:     Scheduled Meds:  benzonatate  200 mg Oral TID  buPROPion  150 mg Oral Daily   chlorpheniramine-HYDROcodone  5 mL Oral Q12H   enoxaparin (LOVENOX) injection  40 mg Subcutaneous Q24H   FLUoxetine  20 mg Oral Daily   fluticasone  2 spray Each Nare Daily   gabapentin  300 mg Oral TID   ipratropium-albuterol  3 mL Nebulization TID   levothyroxine  125 mcg Oral Q0600   lidocaine  1 patch Transdermal Q24H   loratadine  10 mg Oral Daily   montelukast  10 mg Oral QHS    pantoprazole  80 mg Oral Daily   rosuvastatin  20 mg Oral QHS   senna-docusate  1 tablet Oral QHS    Antimicrobials: Anti-infectives (From admission, onward)    Start     Dose/Rate Route Frequency Ordered Stop   07/06/21 1000  azithromycin (ZITHROMAX) tablet 500 mg        500 mg Oral Daily 07/05/21 1403 07/07/21 0935   07/06/21 1000  fluconazole (DIFLUCAN) tablet 150 mg        150 mg Oral  Once 07/06/21 0908 07/06/21 1045   07/04/21 1000  azithromycin (ZITHROMAX) 500 mg in sodium chloride 0.9 % 250 mL IVPB  Status:  Discontinued        500 mg 250 mL/hr over 60 Minutes Intravenous Every 24 hours 07/03/21 1435 07/05/21 1403   07/04/21 1000  cefTRIAXone (ROCEPHIN) 1 g in sodium chloride 0.9 % 100 mL IVPB        1 g 200 mL/hr over 30 Minutes Intravenous Every 24 hours 07/03/21 1605 07/05/21 0942   07/03/21 1400  doxycycline (VIBRAMYCIN) 100 mg in sodium chloride 0.9 % 250 mL IVPB        100 mg 125 mL/hr over 120 Minutes Intravenous  Once 07/03/21 1321 07/03/21 1554   07/03/21 1330  cefTRIAXone (ROCEPHIN) 1 g in sodium chloride 0.9 % 100 mL IVPB        1 g 200 mL/hr over 30 Minutes Intravenous  Once 07/03/21 1321 07/03/21 1358       PRN meds: guaiFENesin-dextromethorphan, ondansetron **OR** ondansetron (ZOFRAN) IV, polyethylene glycol, traZODone   Objective: Vitals:   07/07/21 0735 07/07/21 0844  BP:  113/75  Pulse:  76  Resp:  18  Temp:  98 F (36.7 C)  SpO2: 96% 97%    Intake/Output Summary (Last 24 hours) at 07/07/2021 1014 Last data filed at 07/06/2021 2010 Gross per 24 hour  Intake 240 ml  Output --  Net 240 ml    Filed Weights   07/03/21 2137  Weight: 87.4 kg   Weight change:  Body mass index is 30.18 kg/m.   Physical Exam: General exam: Pleasant, middle-aged Caucasian female.  Mild distress from persistent cough. Skin: No rashes, lesions or ulcers. HEENT: Atraumatic, normocephalic, no obvious bleeding Lungs: No crackles or wheezing.  Continues to have  cough on deep breathing. CVS: Regular rate and rhythm, no murmur GI/Abd soft, nontender, nondistended, bowel sound present CNS: Alert, awake, oriented x3 Psychiatry: Depressed look Extremities: Trace bilateral pedal edema, no calf tenderness  Data Review: I have personally reviewed the laboratory data and studies available.  Recent Labs  Lab 07/03/21 1120 07/04/21 0450 07/05/21 0427 07/06/21 0539 07/07/21 0453  WBC 15.3* 10.8* 11.8* 11.3* 7.9  NEUTROABS 13.0*  --  10.8* 10.1* 5.2  HGB 13.2 12.7 13.2 12.7 12.9  HCT 38.6 38.0 40.7 38.7 39.1  MCV 92.8 94.1 96.2 95.3 95.1  PLT 248 254 257 279 263    Recent Labs  Lab 07/03/21 1120 07/04/21 0450 07/05/21 0427 07/06/21 0539 07/07/21 0453  NA 137 139 139 140 141  K 3.7 4.0 4.8 4.3 4.1  CL 104 108 106 103 105  CO2 21* 23 25 27 30   GLUCOSE 97 142* 149* 111* 99  BUN 10 14 20  24* 23*  CREATININE 0.88 0.88 0.95 1.04* 1.18*  CALCIUM 8.6* 9.0 9.0 9.0 8.6*  MG  --   --  2.3  --   --   PHOS  --   --  4.1  --   --      F/u labs ordered Unresulted Labs (From admission, onward)    None       Signed, , MD [facility] 07/07/2021

## 2021-07-08 MED ORDER — ACETYLCYSTEINE 20 % IN SOLN
4.0000 mL | Freq: Two times a day (BID) | RESPIRATORY_TRACT | Status: DC
  Administered 2021-07-08 – 2021-07-12 (×8): 4 mL via RESPIRATORY_TRACT
  Filled 2021-07-08 (×8): qty 4

## 2021-07-08 NOTE — Progress Notes (Signed)
Patient ambulated around the unit on room air with O2 sats remaining 96-98%.

## 2021-07-08 NOTE — Progress Notes (Signed)
PROGRESS NOTE  Audrey Richard  DOB: March 11, 1961  PCP: Marjie Skiff, NP AYT:[id number]  DOA: 07/03/2021  LOS: 4 days  Hospital Day: 6   Chief Complaint  Patient presents with   Cough   Brief narrative: Audrey Richard is a 60 y.o. female with PMH significant for COPD, mixed hyperlipidemia, depression, anxiety, hypothyroid, GERD, neuropathy. Patient presented to the ED on 7/13 with complaint of shortness of breath, ongoing for the last month, associated with cough, dark thick sputum, not responding to outpatient treatment by PCP with prednisone, inhaler, Tessalon Perles Also mentions dysuria for 2 to 3 weeks. Not vaccinated for COVID-19  In the ED, temperature 98.9, respiratory 18, O2 sat 94% on room air, blood pressure stable. COVID PCR negative Labs unremarkable Given IV Rocephin, doxycycline, Solu-Medrol and DuoNeb with some improvement in symptoms. Kept in observation under hospitalist service  Subjective: Patient was seen and examined this morning.   Cough seems to be gradually improving but he still struggles to bring any phlegm out.  Seems very weak.  Assessment/Plan: Acute exacerbation of COPD Acute respiratory failure with hypoxia -Presented with a month of shortness of breath, productive cough   -Chest x-ray normal, initial WBC count normal, procalcitonin level normal. -Suspect atypical bacterial versus viral pneumonia. -Completed 5-day course of azithromycin.  -Continue prednisone 40 mg daily and DuoNeb. -Continues to have persistent cough despite Tessalon Perles, Tussionex, Claritin and Flonase.  Added Mucomyst yesterday.  Cough seems to be gradually improving after Mucomyst was added. -Encourage incentive spirometry. Recent Labs  Lab 07/03/21 1120 07/03/21 1352 07/04/21 0450 07/05/21 0427 07/06/21 0539 07/07/21 0453  WBC 15.3*  --  10.8* 11.8* 11.3* 7.9  PROCALCITON  --  <0.10  --   --   --   --    UTI -Complain of dysuria.  Urinalysis showed  moderate leukocytes.  Completed 3-day course of IV Rocephin.  Combined systolic and diastolic CHF  -Echocardiogram 3/55 showed EF of 50 to 55% no regional wall motion abnormality, grade 1 diastolic dysfunction, normal right ventricular size and systolic function. -Currently CHF seems compensated.  Not fluid overloaded, not requiring diuretics.  AKI -Baseline creatinine normal, creatinine elevated 1.18 on last blood work on 7/17. -Currently not on diuretics.  Repeat BMP tomorrow. Recent Labs    10/24/20 0959 12/30/20 1606 05/14/21 1603 07/03/21 1120 07/04/21 0450 07/05/21 0427 07/06/21 0539 07/07/21 0453  BUN 12 9 16 10 14 20  24* 23*  CREATININE 1.13* 0.87 0.97 0.88 0.88 0.95 1.04* 1.18*   Hyperlipidemia -rosuvastatin 20 mg nightly resumed  Depression/anxiety -bupropion 150 mg daily, fluoxetine 20 mg daily   Insomnia - Probably because of persistent cough.  Trazodone 50 mg nightly scheduled.  Hypothyroidism -levothyroxine 125 mcg daily before breakfast ordered  GERD -PPI  Generalized weakness -Not much mobile since admission.  Encouraged to ambulate in the hallway.  Check oxygen protocol.   Mobility: Encourage ambulation Code Status:   Code Status: Full Code  Nutritional status: Body mass index is 30.18 kg/m.     Diet:  Diet Order             Diet Heart Room service appropriate? Yes; Fluid consistency: Thin  Diet effective now                  DVT prophylaxis:  enoxaparin (LOVENOX) injection 40 mg Start: 07/03/21 2200 Place TED hose Start: 07/03/21 1610   Antimicrobials: Completed 5-day course of azithromycin Fluid: None Consultants: None Family Communication: None at bedside  Status is: Inpatient  Remains inpatient appropriate because: Continues of persistent cough  Dispo: The patient is from: Home              Anticipated d/c is to: Unable to discharge because of persistent cough, weakness.  Hopefully home in 1 to 2 days.              Patient  currently is not medically stable to d/c.   Difficult to place patient No     Infusions:     Scheduled Meds:  acetylcysteine  4 mL Nebulization BID   benzonatate  200 mg Oral TID   buPROPion  150 mg Oral Daily   chlorpheniramine-HYDROcodone  5 mL Oral Q12H   enoxaparin (LOVENOX) injection  40 mg Subcutaneous Q24H   FLUoxetine  20 mg Oral Daily   fluticasone  2 spray Each Nare Daily   gabapentin  300 mg Oral TID   ipratropium-albuterol  3 mL Nebulization TID   levothyroxine  125 mcg Oral Q0600   lidocaine  1 patch Transdermal Q24H   loratadine  10 mg Oral Daily   montelukast  10 mg Oral QHS   pantoprazole  80 mg Oral Daily   predniSONE  40 mg Oral Q breakfast   rosuvastatin  20 mg Oral QHS   senna-docusate  1 tablet Oral QHS   traZODone  50 mg Oral QHS    Antimicrobials: Anti-infectives (From admission, onward)    Start     Dose/Rate Route Frequency Ordered Stop   07/06/21 1000  azithromycin (ZITHROMAX) tablet 500 mg        500 mg Oral Daily 07/05/21 1403 07/07/21 0935   07/06/21 1000  fluconazole (DIFLUCAN) tablet 150 mg        150 mg Oral  Once 07/06/21 0908 07/06/21 1045   07/04/21 1000  azithromycin (ZITHROMAX) 500 mg in sodium chloride 0.9 % 250 mL IVPB  Status:  Discontinued        500 mg 250 mL/hr over 60 Minutes Intravenous Every 24 hours 07/03/21 1435 07/05/21 1403   07/04/21 1000  cefTRIAXone (ROCEPHIN) 1 g in sodium chloride 0.9 % 100 mL IVPB        1 g 200 mL/hr over 30 Minutes Intravenous Every 24 hours 07/03/21 1605 07/05/21 0942   07/03/21 1400  doxycycline (VIBRAMYCIN) 100 mg in sodium chloride 0.9 % 250 mL IVPB        100 mg 125 mL/hr over 120 Minutes Intravenous  Once 07/03/21 1321 07/03/21 1554   07/03/21 1330  cefTRIAXone (ROCEPHIN) 1 g in sodium chloride 0.9 % 100 mL IVPB        1 g 200 mL/hr over 30 Minutes Intravenous  Once 07/03/21 1321 07/03/21 1358       PRN meds: guaiFENesin-dextromethorphan, ondansetron **OR** ondansetron (ZOFRAN) IV,  polyethylene glycol   Objective: Vitals:   07/08/21 0537 07/08/21 0833  BP: 112/84 109/75  Pulse: 87 72  Resp: 16 18  Temp: 97.6 F (36.4 C) 97.8 F (36.6 C)  SpO2: 95% 96%    Intake/Output Summary (Last 24 hours) at 07/08/2021 1115 Last data filed at 07/08/2021 1024 Gross per 24 hour  Intake 720 ml  Output --  Net 720 ml   Filed Weights   07/03/21 2137  Weight: 87.4 kg   Weight change:  Body mass index is 30.18 kg/m.   Physical Exam: General exam: Pleasant, middle-aged Caucasian female.  Feels very weak.   Skin: No rashes, lesions or ulcers. HEENT:  Atraumatic, normocephalic, no obvious bleeding Lungs: No crackles or wheezing.  Continues to have cough on deep breathing. CVS: Regular rate and rhythm, no murmur GI/Abd soft, nontender, nondistended, bowel sound present CNS: Alert, awake, oriented x3 Psychiatry: Depressed look Extremities: Trace bilateral pedal edema, no calf tenderness  Data Review: I have personally reviewed the laboratory data and studies available.  Recent Labs  Lab 07/03/21 1120 07/04/21 0450 07/05/21 0427 07/06/21 0539 07/07/21 0453  WBC 15.3* 10.8* 11.8* 11.3* 7.9  NEUTROABS 13.0*  --  10.8* 10.1* 5.2  HGB 13.2 12.7 13.2 12.7 12.9  HCT 38.6 38.0 40.7 38.7 39.1  MCV 92.8 94.1 96.2 95.3 95.1  PLT 248 254 257 279 263   Recent Labs  Lab 07/03/21 1120 07/04/21 0450 07/05/21 0427 07/06/21 0539 07/07/21 0453  NA 137 139 139 140 141  K 3.7 4.0 4.8 4.3 4.1  CL 104 108 106 103 105  CO2 21* 23 25 27 30   GLUCOSE 97 142* 149* 111* 99  BUN 10 14 20  24* 23*  CREATININE 0.88 0.88 0.95 1.04* 1.18*  CALCIUM 8.6* 9.0 9.0 9.0 8.6*  MG  --   --  2.3  --   --   PHOS  --   --  4.1  --   --     F/u labs ordered Unresulted Labs (From admission, onward)     Start     Ordered   07/09/21 0500  CBC with Differential/Platelet  Tomorrow morning,   R        07/08/21 0814   07/09/21 0500  Basic metabolic panel  Tomorrow morning,   R        07/08/21  0814            Signed, 07/11/21, MD [facility] 07/08/2021

## 2021-07-08 NOTE — Plan of Care (Signed)

## 2021-07-09 ENCOUNTER — Ambulatory Visit: Payer: BC Managed Care – PPO | Admitting: Nurse Practitioner

## 2021-07-09 ENCOUNTER — Inpatient Hospital Stay: Payer: BC Managed Care – PPO

## 2021-07-09 LAB — BRAIN NATRIURETIC PEPTIDE: B Natriuretic Peptide: 43.3 pg/mL (ref 0.0–100.0)

## 2021-07-09 LAB — CBC WITH DIFFERENTIAL/PLATELET
Abs Immature Granulocytes: 0.2 10*3/uL — ABNORMAL HIGH (ref 0.00–0.07)
Basophils Absolute: 0 10*3/uL (ref 0.0–0.1)
Basophils Relative: 0 %
Eosinophils Absolute: 0.1 10*3/uL (ref 0.0–0.5)
Eosinophils Relative: 1 %
HCT: 39.8 % (ref 36.0–46.0)
Hemoglobin: 13.1 g/dL (ref 12.0–15.0)
Immature Granulocytes: 2 %
Lymphocytes Relative: 17 %
Lymphs Abs: 1.8 10*3/uL (ref 0.7–4.0)
MCH: 31.1 pg (ref 26.0–34.0)
MCHC: 32.9 g/dL (ref 30.0–36.0)
MCV: 94.5 fL (ref 80.0–100.0)
Monocytes Absolute: 0.5 10*3/uL (ref 0.1–1.0)
Monocytes Relative: 5 %
Neutro Abs: 7.7 10*3/uL (ref 1.7–7.7)
Neutrophils Relative %: 75 %
Platelets: 274 10*3/uL (ref 150–400)
RBC: 4.21 MIL/uL (ref 3.87–5.11)
RDW: 12.8 % (ref 11.5–15.5)
WBC: 10.4 10*3/uL (ref 4.0–10.5)
nRBC: 0 % (ref 0.0–0.2)

## 2021-07-09 LAB — BASIC METABOLIC PANEL
Anion gap: 9 (ref 5–15)
BUN: 24 mg/dL — ABNORMAL HIGH (ref 6–20)
CO2: 28 mmol/L (ref 22–32)
Calcium: 8.7 mg/dL — ABNORMAL LOW (ref 8.9–10.3)
Chloride: 103 mmol/L (ref 98–111)
Creatinine, Ser: 0.97 mg/dL (ref 0.44–1.00)
GFR, Estimated: >60 mL/min (ref >60)
Glucose, Bld: 92 mg/dL (ref 70–99)
Potassium: 4.1 mmol/L (ref 3.5–5.1)
Sodium: 140 mmol/L (ref 135–145)

## 2021-07-09 NOTE — Progress Notes (Signed)
PROGRESS NOTE  Audrey Richard  DOB: February 22, 1961  PCP: Marjie Skiff, NP ZOX:[id number]  DOA: 07/03/2021  LOS: 5 days  Hospital Day: 7   Chief Complaint  Patient presents with   Cough   Brief narrative: Audrey Richard is a 60 y.o. female with PMH significant for COPD, mixed hyperlipidemia, depression, anxiety, hypothyroid, GERD, neuropathy. Patient presented to the ED on 7/13 with complaint of shortness of breath, ongoing for the last month, associated with cough, dark thick sputum, not responding to outpatient treatment by PCP with prednisone, inhaler, Tessalon Perles Also mentions dysuria for 2 to 3 weeks. Not vaccinated for COVID-19  In the ED, temperature 98.9, respiratory 18, O2 sat 94% on room air, blood pressure stable. COVID PCR negative Labs unremarkable Given IV Rocephin, doxycycline, Solu-Medrol and DuoNeb with some improvement in symptoms. Admitted to hospitalist service. Patient is still has significant degree of cough and does not feel comfortable going home.  Subjective: Patient was seen and examined this morning.   Continues to have constant struggle with cough.  Did not sleep well last night because of persistent cough. Does not feel comfortable going home.  Assessment/Plan: Acute exacerbation of COPD Acute respiratory failure with hypoxia -Presented with a month of shortness of breath, productive cough   -Initial chest x-ray was normal, WBC count normal, procalcitonin level normal. -Suspect atypical bacterial versus viral pneumonia. -Completed 5-day course of azithromycin.  -Currently remains on prednisone 40 mg daily, DuoNeb. -Continues to have persistent cough despite Tessalon Perles, Tussionex, Claritin, Mucomyst and Flonase.  Feels somewhat better after Mucomyst was added 2 days ago but still has significant amount of cough. -Continue to monitor clinically.  Continue incentive spirometry as well. -Initially required supplemental oxygen.  Currently  able to ambulate on room air. Recent Labs  Lab 07/03/21 1352 07/04/21 0450 07/05/21 0427 07/06/21 0539 07/07/21 0453 07/09/21 0545  WBC  --  10.8* 11.8* 11.3* 7.9 10.4  PROCALCITON <0.10  --   --   --   --   --     UTI -Complain of dysuria.  Urinalysis showed moderate leukocytes.  Completed 3-day course of IV Rocephin.  Combined systolic and diastolic CHF  -Echocardiogram 8/11 showed EF of 50 to 55% no regional wall motion abnormality, grade 1 diastolic dysfunction, normal right ventricular size and systolic function. -Currently CHF seems compensated.  Not fluid overloaded, not requiring diuretics.  Hyperlipidemia -rosuvastatin 20 mg nightly resumed  Depression/anxiety -bupropion 150 mg daily, fluoxetine 20 mg daily   Insomnia - Probably because of persistent cough.  Trazodone 50 mg nightly scheduled.  Hypothyroidism -levothyroxine 125 mcg daily before breakfast ordered  GERD -PPI  Generalized weakness -Not much mobile since admission.  Encouraged to ambulate in the hallway every day.  Mobility: Encourage ambulation Code Status:   Code Status: Full Code  Nutritional status: Body mass index is 30.18 kg/m.     Diet:  Diet Order             Diet Heart Room service appropriate? Yes; Fluid consistency: Thin  Diet effective now                  DVT prophylaxis:  enoxaparin (LOVENOX) injection 40 mg Start: 07/03/21 2200 Place TED hose Start: 07/03/21 1610   Antimicrobials: Completed 5-day course of azithromycin Fluid: None Consultants: None Family Communication: None at bedside  Status is: Inpatient  Remains inpatient appropriate because: Continues of persistent cough  Dispo: The patient is from: Home  Anticipated d/c is to: Unable to discharge because of persistent cough, weakness.  Hopefully home in 1 to 2 days.              Patient currently is not medically stable to d/c.   Difficult to place patient No     Infusions:      Scheduled Meds:  acetylcysteine  4 mL Nebulization BID   benzonatate  200 mg Oral TID   buPROPion  150 mg Oral Daily   chlorpheniramine-HYDROcodone  5 mL Oral Q12H   enoxaparin (LOVENOX) injection  40 mg Subcutaneous Q24H   FLUoxetine  20 mg Oral Daily   fluticasone  2 spray Each Nare Daily   gabapentin  300 mg Oral TID   ipratropium-albuterol  3 mL Nebulization TID   levothyroxine  125 mcg Oral Q0600   lidocaine  1 patch Transdermal Q24H   loratadine  10 mg Oral Daily   montelukast  10 mg Oral QHS   pantoprazole  80 mg Oral Daily   predniSONE  40 mg Oral Q breakfast   rosuvastatin  20 mg Oral QHS   senna-docusate  1 tablet Oral QHS   traZODone  50 mg Oral QHS    Antimicrobials: Anti-infectives (From admission, onward)    Start     Dose/Rate Route Frequency Ordered Stop   07/06/21 1000  azithromycin (ZITHROMAX) tablet 500 mg        500 mg Oral Daily 07/05/21 1403 07/07/21 0935   07/06/21 1000  fluconazole (DIFLUCAN) tablet 150 mg        150 mg Oral  Once 07/06/21 0908 07/06/21 1045   07/04/21 1000  azithromycin (ZITHROMAX) 500 mg in sodium chloride 0.9 % 250 mL IVPB  Status:  Discontinued        500 mg 250 mL/hr over 60 Minutes Intravenous Every 24 hours 07/03/21 1435 07/05/21 1403   07/04/21 1000  cefTRIAXone (ROCEPHIN) 1 g in sodium chloride 0.9 % 100 mL IVPB        1 g 200 mL/hr over 30 Minutes Intravenous Every 24 hours 07/03/21 1605 07/05/21 0942   07/03/21 1400  doxycycline (VIBRAMYCIN) 100 mg in sodium chloride 0.9 % 250 mL IVPB        100 mg 125 mL/hr over 120 Minutes Intravenous  Once 07/03/21 1321 07/03/21 1554   07/03/21 1330  cefTRIAXone (ROCEPHIN) 1 g in sodium chloride 0.9 % 100 mL IVPB        1 g 200 mL/hr over 30 Minutes Intravenous  Once 07/03/21 1321 07/03/21 1358       PRN meds: guaiFENesin-dextromethorphan, ondansetron **OR** ondansetron (ZOFRAN) IV, polyethylene glycol   Objective: Vitals:   07/09/21 0427 07/09/21 0809  BP: 112/73 116/86   Pulse: 73 66  Resp: 18 (!) 22  Temp: (!) 97.5 F (36.4 C) (!) 97.5 F (36.4 C)  SpO2: 95% 96%    Intake/Output Summary (Last 24 hours) at 07/09/2021 1157 Last data filed at 07/09/2021 1036 Gross per 24 hour  Intake 960 ml  Output --  Net 960 ml    Filed Weights   07/03/21 2137  Weight: 87.4 kg   Weight change:  Body mass index is 30.18 kg/m.   Physical Exam: General exam: Pleasant, middle-aged Caucasian female.  Continues to feel weak Skin: No rashes, lesions or ulcers. HEENT: Atraumatic, normocephalic, no obvious bleeding Lungs: No crackles or wheezing.  Continues to have cough on deep breathing. CVS: Regular rate and rhythm, no murmur GI/Abd soft, nontender, nondistended,  bowel sound present CNS: Alert, awake, oriented x3 Psychiatry: Depressed look. Extremities: No pedal edema, no calf tenderness  Data Review: I have personally reviewed the laboratory data and studies available.  Recent Labs  Lab 07/03/21 1120 07/04/21 0450 07/05/21 0427 07/06/21 0539 07/07/21 0453 07/09/21 0545  WBC 15.3* 10.8* 11.8* 11.3* 7.9 10.4  NEUTROABS 13.0*  --  10.8* 10.1* 5.2 7.7  HGB 13.2 12.7 13.2 12.7 12.9 13.1  HCT 38.6 38.0 40.7 38.7 39.1 39.8  MCV 92.8 94.1 96.2 95.3 95.1 94.5  PLT 248 254 257 279 263 274    Recent Labs  Lab 07/04/21 0450 07/05/21 0427 07/06/21 0539 07/07/21 0453 07/09/21 0545  NA 139 139 140 141 140  K 4.0 4.8 4.3 4.1 4.1  CL 108 106 103 105 103  CO2 23 25 27 30 28   GLUCOSE 142* 149* 111* 99 92  BUN 14 20 24* 23* 24*  CREATININE 0.88 0.95 1.04* 1.18* 0.97  CALCIUM 9.0 9.0 9.0 8.6* 8.7*  MG  --  2.3  --   --   --   PHOS  --  4.1  --   --   --      F/u labs ordered Unresulted Labs (From admission, onward)    None       Signed, , MD [facility] 07/09/2021

## 2021-07-10 MED ORDER — PREDNISONE 20 MG PO TABS
30.0000 mg | ORAL_TABLET | Freq: Every day | ORAL | Status: DC
  Administered 2021-07-11 – 2021-07-12 (×2): 30 mg via ORAL
  Filled 2021-07-10 (×2): qty 1

## 2021-07-10 NOTE — Progress Notes (Signed)
PROGRESS NOTE  Audrey Richard  DOB: [birth year]  PCP: Marjie Skiff, NP WNU:[id number]  DOA: 07/03/2021  LOS: 6 days  Hospital Day: 8   Chief Complaint  Patient presents with   Cough   Brief narrative: Audrey Richard is a 60 y.o. female with PMH significant for COPD, mixed hyperlipidemia, depression, anxiety, hypothyroid, GERD, neuropathy. Patient presented to the ED on 7/13 with complaint of shortness of breath, ongoing for the last month, associated with cough, dark thick sputum, not responding to outpatient treatment by PCP with prednisone, inhaler, Tessalon Perles -Treated for COPD exacerbation, dyspnea responding to duo nebs antibiotics, steroids etc. -Mostly debilitated by severe hacking cough  Subjective: -Still continues to have hacking cough, starting to improve a little, dyspnea is improving as well  Assessment/Plan:  Acute exacerbation of COPD Acute respiratory failure with hypoxia Severe coughing spells -Presented with a month of shortness of breath, productive cough   -Treated for severe COPD exacerbation, using IV steroids, duo nebs, azithromycin -Clinically improving but extremely debilitated by her severe cough -Finally starting to improve on current regimen of steroids, Tessalon Perles, Tussionex, Claritin, Flonase etc. -Increase activity, incentive spirometer -Discharge planning  UTI -Complain of dysuria.  Urinalysis showed moderate leukocytes.  Completed 3-day course of IV Rocephin.  Combined systolic and diastolic CHF  -Echocardiogram 0/34 showed EF of 50 to 55% no regional wall motion abnormality, grade 1 diastolic dysfunction, normal right ventricular size and systolic function. -Currently CHF seems compensated.  Not fluid overloaded, not requiring diuretics.  Hyperlipidemia -rosuvastatin 20 mg nightly resumed  Depression/anxiety -bupropion 150 mg daily, fluoxetine 20 mg daily   Insomnia - Probably because of persistent cough.  Trazodone  50 mg nightly scheduled.  Hypothyroidism -levothyroxine 125 mcg daily before breakfast ordered  GERD -PPI  Generalized weakness -Encouraged ambulation  Code Status:   Code Status: Full Code  Nutritional status: Body mass index is 30.18 kg/m.     Diet:  Diet Order             Diet Heart Room service appropriate? Yes; Fluid consistency: Thin  Diet effective now                  DVT prophylaxis:  enoxaparin (LOVENOX) injection 40 mg Start: 07/03/21 2200 Place TED hose Start: 07/03/21 1610  Consultants: None Family Communication: None at bedside  Status is: Inpatient  Remains inpatient appropriate because: Continues of persistent cough  Dispo: The patient is from: Home              Anticipated d/c is to: Hopefully home tomorrow              Patient currently is not medically stable to d/c.   Difficult to place patient No     Infusions:     Scheduled Meds:  acetylcysteine  4 mL Nebulization BID   benzonatate  200 mg Oral TID   buPROPion  150 mg Oral Daily   chlorpheniramine-HYDROcodone  5 mL Oral Q12H   enoxaparin (LOVENOX) injection  40 mg Subcutaneous Q24H   FLUoxetine  20 mg Oral Daily   fluticasone  2 spray Each Nare Daily   gabapentin  300 mg Oral TID   ipratropium-albuterol  3 mL Nebulization TID   levothyroxine  125 mcg Oral Q0600   loratadine  10 mg Oral Daily   montelukast  10 mg Oral QHS   pantoprazole  80 mg Oral Daily   predniSONE  40 mg Oral Q breakfast  rosuvastatin  20 mg Oral QHS   senna-docusate  1 tablet Oral QHS   traZODone  50 mg Oral QHS    Antimicrobials: Anti-infectives (From admission, onward)    Start     Dose/Rate Route Frequency Ordered Stop   07/06/21 1000  azithromycin (ZITHROMAX) tablet 500 mg        500 mg Oral Daily 07/05/21 1403 07/07/21 0935   07/06/21 1000  fluconazole (DIFLUCAN) tablet 150 mg        150 mg Oral  Once 07/06/21 0908 07/06/21 1045   07/04/21 1000  azithromycin (ZITHROMAX) 500 mg in sodium  chloride 0.9 % 250 mL IVPB  Status:  Discontinued        500 mg 250 mL/hr over 60 Minutes Intravenous Every 24 hours 07/03/21 1435 07/05/21 1403   07/04/21 1000  cefTRIAXone (ROCEPHIN) 1 g in sodium chloride 0.9 % 100 mL IVPB        1 g 200 mL/hr over 30 Minutes Intravenous Every 24 hours 07/03/21 1605 07/05/21 0942   07/03/21 1400  doxycycline (VIBRAMYCIN) 100 mg in sodium chloride 0.9 % 250 mL IVPB        100 mg 125 mL/hr over 120 Minutes Intravenous  Once 07/03/21 1321 07/03/21 1554   07/03/21 1330  cefTRIAXone (ROCEPHIN) 1 g in sodium chloride 0.9 % 100 mL IVPB        1 g 200 mL/hr over 30 Minutes Intravenous  Once 07/03/21 1321 07/03/21 1358       PRN meds: guaiFENesin-dextromethorphan, ondansetron **OR** ondansetron (ZOFRAN) IV, polyethylene glycol   Objective: Vitals:   07/10/21 0751 07/10/21 1117  BP: 122/78 (!) 127/92  Pulse: 68 78  Resp: 20 18  Temp: 98.1 F (36.7 C) 98 F (36.7 C)  SpO2: 95% 92%    Intake/Output Summary (Last 24 hours) at 07/10/2021 1415 Last data filed at 07/10/2021 1354 Gross per 24 hour  Intake 960 ml  Output --  Net 960 ml   Filed Weights   07/03/21 2137  Weight: 87.4 kg   Weight change:  Body mass index is 30.18 kg/m.   Physical Exam: Gen: Chronically ill pleasant female, sitting up in bed, AAOx3, uncomfortable appearing HEENT: No JVD  CVS: S1-S2, regular rate rhythm Lungs: Poor air movement bilaterally, rhonchi at the left base  Abdomen: Soft, nontender, bowel sounds present Extremities: No edema  Psychiatry: flat affect  Data Review: I have personally reviewed the laboratory data and studies available.  Recent Labs  Lab 07/04/21 0450 07/05/21 0427 07/06/21 0539 07/07/21 0453 07/09/21 0545  WBC 10.8* 11.8* 11.3* 7.9 10.4  NEUTROABS  --  10.8* 10.1* 5.2 7.7  HGB 12.7 13.2 12.7 12.9 13.1  HCT 38.0 40.7 38.7 39.1 39.8  MCV 94.1 96.2 95.3 95.1 94.5  PLT 254 257 279 263 274   Recent Labs  Lab 07/04/21 0450  07/05/21 0427 07/06/21 0539 07/07/21 0453 07/09/21 0545  NA 139 139 140 141 140  K 4.0 4.8 4.3 4.1 4.1  CL 108 106 103 105 103  CO2 23 25 27 30 28   GLUCOSE 142* 149* 111* 99 92  BUN 14 20 24* 23* 24*  CREATININE 0.88 0.95 1.04* 1.18* 0.97  CALCIUM 9.0 9.0 9.0 8.6* 8.7*  MG  --  2.3  --   --   --   PHOS  --  4.1  --   --   --     F/u labs ordered Unresulted Labs (From admission, onward)    None  Signed, Zannie Cove, MD [facility] 07/10/2021

## 2021-07-11 ENCOUNTER — Other Ambulatory Visit: Payer: Self-pay

## 2021-07-11 ENCOUNTER — Telehealth: Payer: Self-pay

## 2021-07-11 MED ORDER — HYDROCOD POLST-CPM POLST ER 10-8 MG/5ML PO SUER
5.0000 mL | Freq: Two times a day (BID) | ORAL | Status: DC | PRN
  Administered 2021-07-11 – 2021-07-12 (×2): 5 mL via ORAL
  Filled 2021-07-11 (×3): qty 5

## 2021-07-11 NOTE — Progress Notes (Signed)
PROGRESS NOTE  Audrey Richard  DOB: [birth year]  PCP: Marjie Skiff, NP SEG:[id number]  DOA: 07/03/2021  LOS: 7 days  Hospital Day: 9   Chief Complaint  Patient presents with   Cough   Brief narrative: Audrey Richard is a 60 y.o. female with PMH significant for COPD, mixed hyperlipidemia, depression, anxiety, hypothyroid, GERD, neuropathy. Patient presented to the ED on 7/13 with complaint of shortness of breath, ongoing for the last month, associated with cough, dark thick sputum, not responding to outpatient treatment by PCP with prednisone, inhaler, Tessalon Perles -Treated for COPD exacerbation, dyspnea responding to duo nebs antibiotics, steroids etc. -Mostly debilitated by severe hacking cough  Subjective: -Slowly improving, dyspnea is improved, cough is slowly improving  Assessment/Plan:  Acute exacerbation of COPD Acute respiratory failure with hypoxia Severe coughing spells -Presented with a month of shortness of breath, productive cough   -Treated for severe COPD exacerbation, using IV steroids, duo nebs, azithromycin -Clinically improving but extremely debilitated by her severe cough -Slowly improving on current regimen of steroids, Tessalon Perles, Tussionex, Claritin, Flonase  -Weaned off O2, increase activity  UTI -Complained of dysuria, urinalysis showed moderate leukocytes.  Completed 3-day course of IV Rocephin.  Combined systolic and diastolic CHF  -Echocardiogram 7/37 showed EF of 50 to 55% no regional wall motion abnormality, grade 1 diastolic dysfunction, normal right ventricular size and systolic function. -Currently CHF seems compensated.  Not fluid overloaded, not requiring diuretics.  Hyperlipidemia -rosuvastatin 20 mg nightly resumed  Depression/anxiety -bupropion 150 mg daily, fluoxetine 20 mg daily   Insomnia - Probably because of persistent cough.  Trazodone 50 mg nightly scheduled.  Hypothyroidism -levothyroxine 125 mcg daily  before breakfast ordered  GERD -PPI  Generalized weakness -Encouraged ambulation  Code Status:   Code Status: Full Code  Nutritional status: Body mass index is 30.18 kg/m.     Diet:  Diet Order             Diet Heart Room service appropriate? Yes; Fluid consistency: Thin  Diet effective now                  DVT prophylaxis:  enoxaparin (LOVENOX) injection 40 mg Start: 07/03/21 2200 Place TED hose Start: 07/03/21 1610  Consultants: None Family Communication: None at bedside  Status is: Inpatient  Remains inpatient appropriate because: Continues of persistent cough  Dispo: The patient is from: Home              Anticipated d/c is to: home tomorrow              Patient currently is not medically stable to d/c.   Difficult to place patient No   Scheduled Meds:  acetylcysteine  4 mL Nebulization BID   benzonatate  200 mg Oral TID   buPROPion  150 mg Oral Daily   enoxaparin (LOVENOX) injection  40 mg Subcutaneous Q24H   FLUoxetine  20 mg Oral Daily   fluticasone  2 spray Each Nare Daily   gabapentin  300 mg Oral TID   ipratropium-albuterol  3 mL Nebulization TID   levothyroxine  125 mcg Oral Q0600   loratadine  10 mg Oral Daily   montelukast  10 mg Oral QHS   pantoprazole  80 mg Oral Daily   predniSONE  30 mg Oral Q breakfast   rosuvastatin  20 mg Oral QHS   senna-docusate  1 tablet Oral QHS   traZODone  50 mg Oral QHS    Antimicrobials: Anti-infectives (  From admission, onward)    Start     Dose/Rate Route Frequency Ordered Stop   07/06/21 1000  azithromycin (ZITHROMAX) tablet 500 mg        500 mg Oral Daily 07/05/21 1403 07/07/21 0935   07/06/21 1000  fluconazole (DIFLUCAN) tablet 150 mg        150 mg Oral  Once 07/06/21 0908 07/06/21 1045   07/04/21 1000  azithromycin (ZITHROMAX) 500 mg in sodium chloride 0.9 % 250 mL IVPB  Status:  Discontinued        500 mg 250 mL/hr over 60 Minutes Intravenous Every 24 hours 07/03/21 1435 07/05/21 1403    07/04/21 1000  cefTRIAXone (ROCEPHIN) 1 g in sodium chloride 0.9 % 100 mL IVPB        1 g 200 mL/hr over 30 Minutes Intravenous Every 24 hours 07/03/21 1605 07/05/21 0942   07/03/21 1400  doxycycline (VIBRAMYCIN) 100 mg in sodium chloride 0.9 % 250 mL IVPB        100 mg 125 mL/hr over 120 Minutes Intravenous  Once 07/03/21 1321 07/03/21 1554   07/03/21 1330  cefTRIAXone (ROCEPHIN) 1 g in sodium chloride 0.9 % 100 mL IVPB        1 g 200 mL/hr over 30 Minutes Intravenous  Once 07/03/21 1321 07/03/21 1358       PRN meds: chlorpheniramine-HYDROcodone, guaiFENesin-dextromethorphan, ondansetron **OR** ondansetron (ZOFRAN) IV, polyethylene glycol   Objective: Vitals:   07/11/21 0740 07/11/21 1113  BP: 120/82 133/84  Pulse: 72 78  Resp: 16 17  Temp: 98.1 F (36.7 C) 98.1 F (36.7 C)  SpO2: 98% (!) 89%    Intake/Output Summary (Last 24 hours) at 07/11/2021 1343 Last data filed at 07/11/2021 1104 Gross per 24 hour  Intake 1080 ml  Output --  Net 1080 ml   Filed Weights   07/03/21 2137  Weight: 87.4 kg   Weight change:  Body mass index is 30.18 kg/m.   Physical Exam: Gen: Chronically ill pleasant female, sitting up in bed, uncomfortable appearing CVS: S1-S2, regular rate rhythm Lungs: Poor air movement bilaterally, no expiratory wheezes today Abdomen: Soft, nontender, bowel sounds present Extremities: No edema Skin: No rash on exposed skin Psychiatry: flat affect  Data Review: I have personally reviewed the laboratory data and studies available.  Recent Labs  Lab 07/05/21 0427 07/06/21 0539 07/07/21 0453 07/09/21 0545  WBC 11.8* 11.3* 7.9 10.4  NEUTROABS 10.8* 10.1* 5.2 7.7  HGB 13.2 12.7 12.9 13.1  HCT 40.7 38.7 39.1 39.8  MCV 96.2 95.3 95.1 94.5  PLT 257 279 263 274   Recent Labs  Lab 07/05/21 0427 07/06/21 0539 07/07/21 0453 07/09/21 0545  NA 139 140 141 140  K 4.8 4.3 4.1 4.1  CL 106 103 105 103  CO2 25 27 30 28   GLUCOSE 149* 111* 99 92  BUN 20 24*  23* 24*  CREATININE 0.95 1.04* 1.18* 0.97  CALCIUM 9.0 9.0 8.6* 8.7*  MG 2.3  --   --   --   PHOS 4.1  --   --   --     F/u labs ordered Unresulted Labs (From admission, onward)    None       Signed, , MD [facility] 07/11/2021

## 2021-07-11 NOTE — Progress Notes (Signed)
Opened in error

## 2021-07-12 IMAGING — CT CT RENAL STONE PROTOCOL
2 of 4 series · 16 of 46 positions shown, 18 images · non-contrast
Comparison: None.

CLINICAL DATA: 59-year-old female with hematuria.

EXAM:
CT ABDOMEN AND PELVIS WITHOUT CONTRAST
TECHNIQUE: Multidetector CT imaging of the abdomen and pelvis was performed
following the standard protocol without IV contrast.

[Series 2: stone full standard · axial · 0.80mm/px · z∈[-496,-71]mm · 13 of 93 slices shown, 15 images]
[im 4/93  soft-tissue]
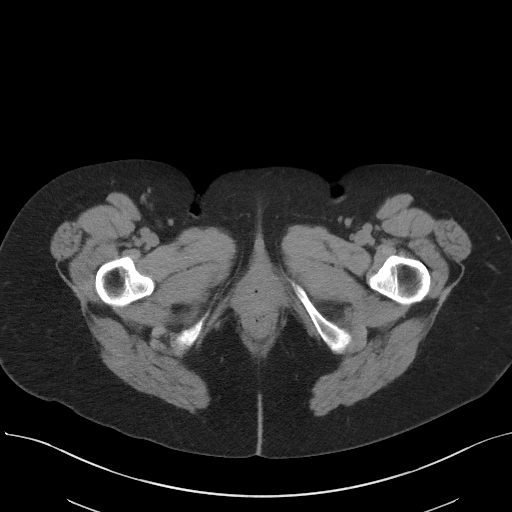
[im 4/93  bone]
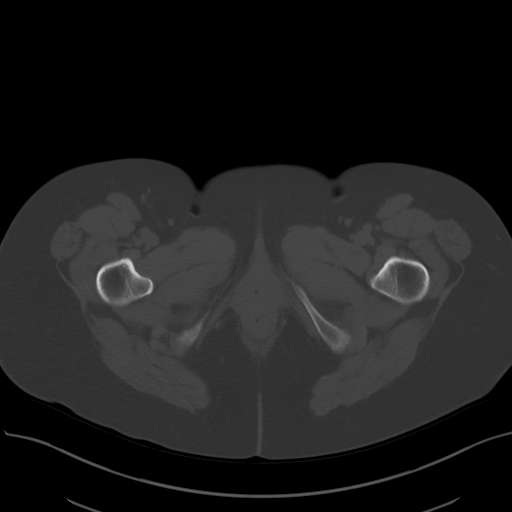
[im 12/93  soft-tissue]
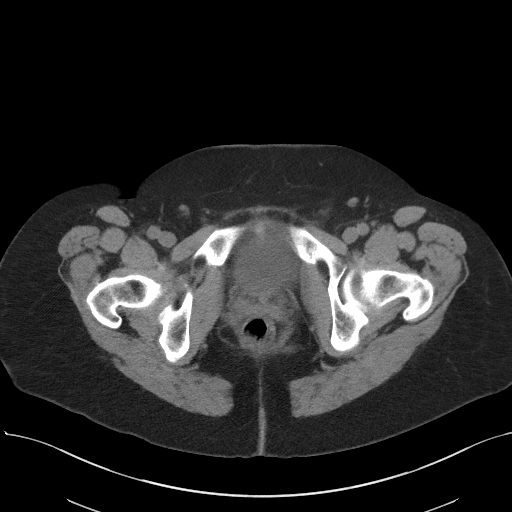
[im 19/93  soft-tissue]
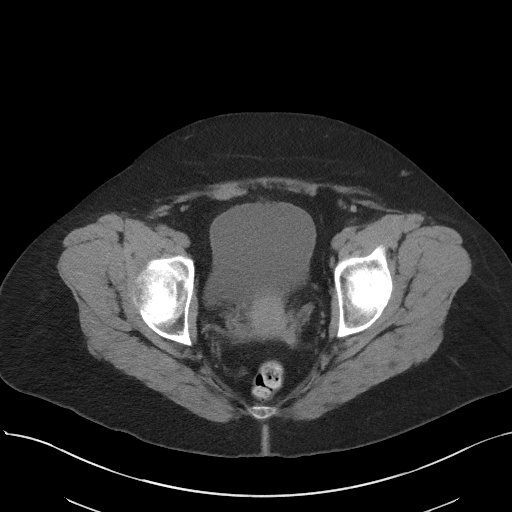
[im 26/93  soft-tissue]
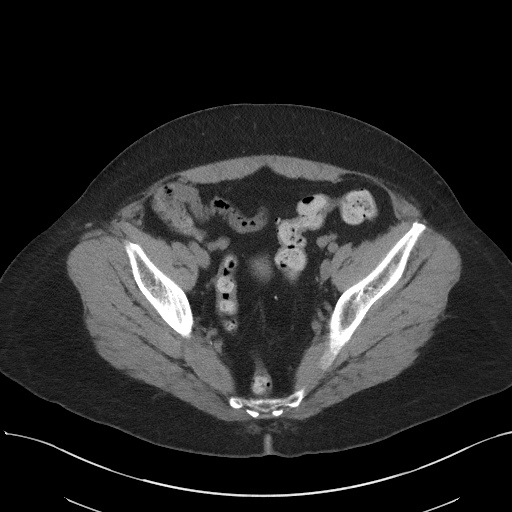
[im 34/93  soft-tissue]
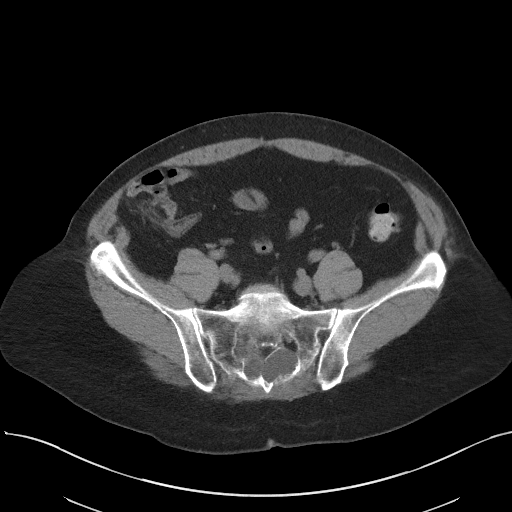
[im 41/93  soft-tissue]
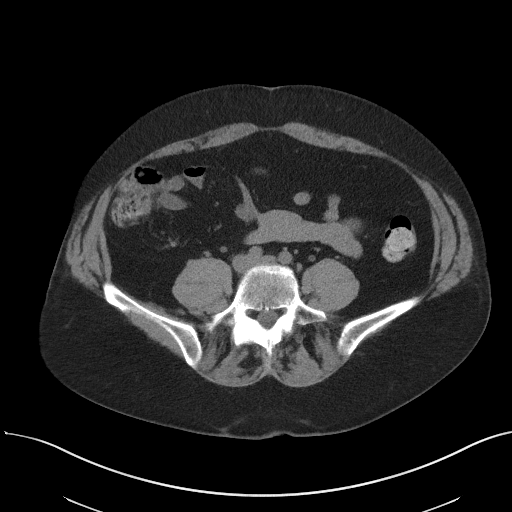
[im 48/93  soft-tissue]
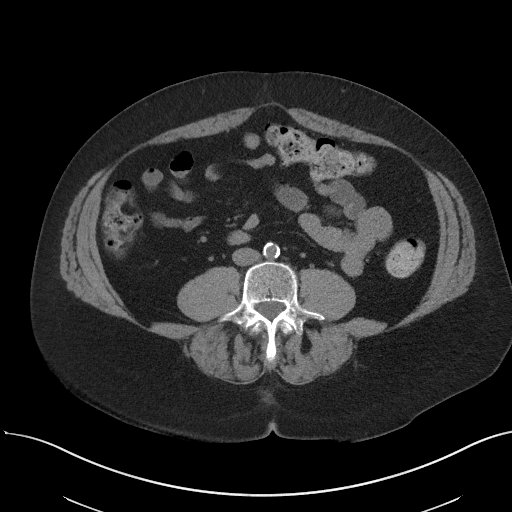
[im 52/93  soft-tissue]
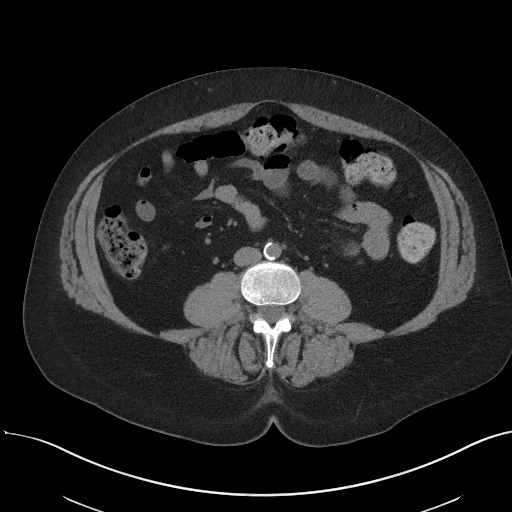
[im 59/93  soft-tissue]
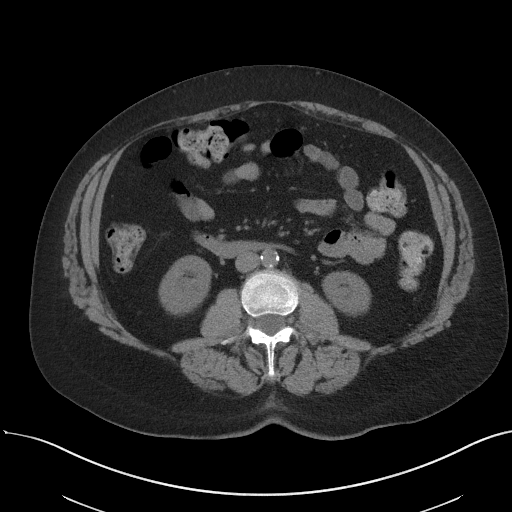
[im 59/93  bone]
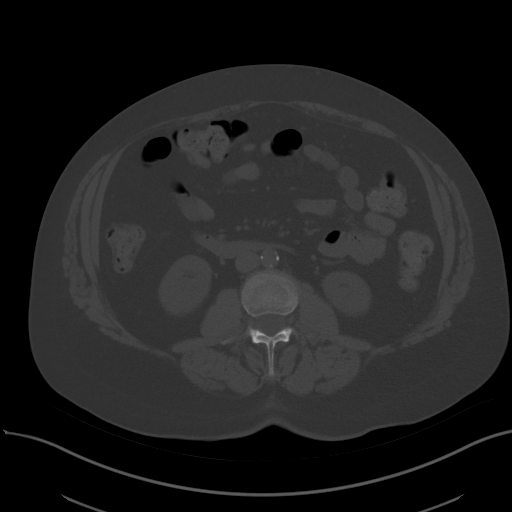
[im 67/93  soft-tissue]
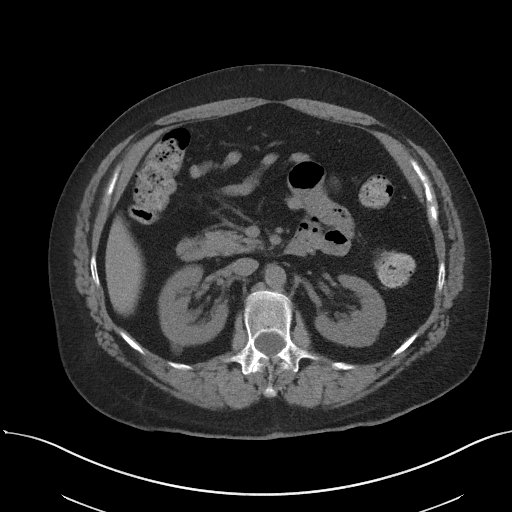
[im 74/93  soft-tissue]
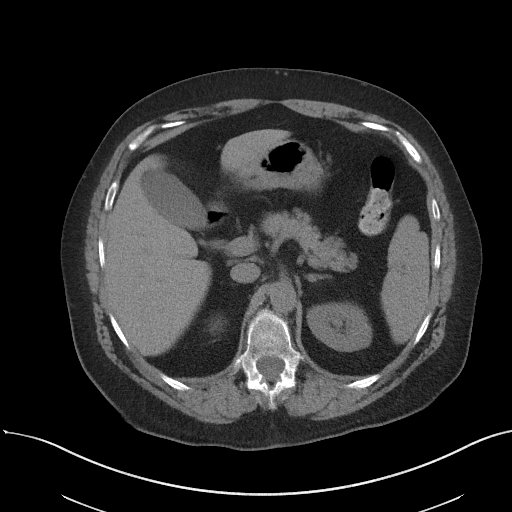
[im 81/93  soft-tissue]
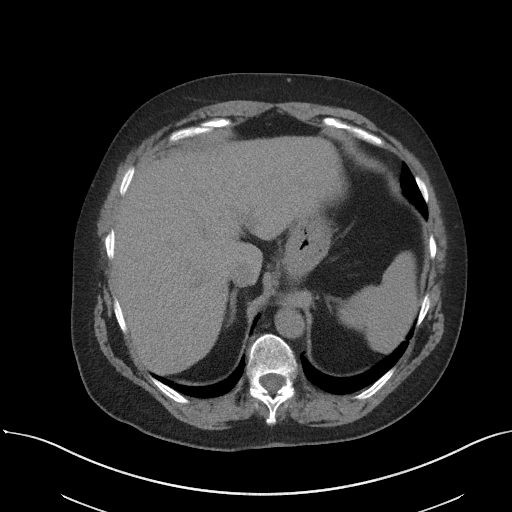
[im 89/93  soft-tissue]
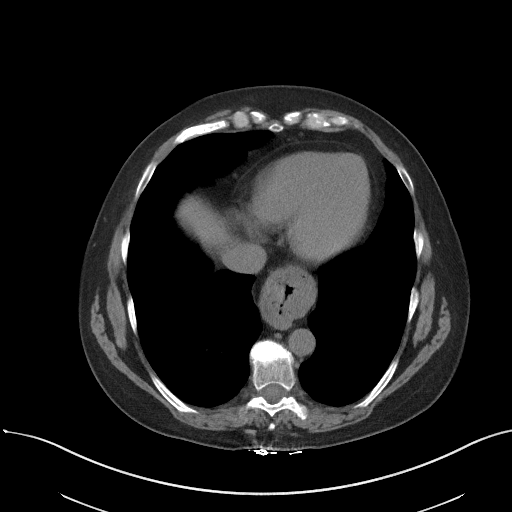

[Series 5: coronal · coronal · 0.75mm/px · 3 of 159 slices shown]
[im 53/159  soft-tissue]
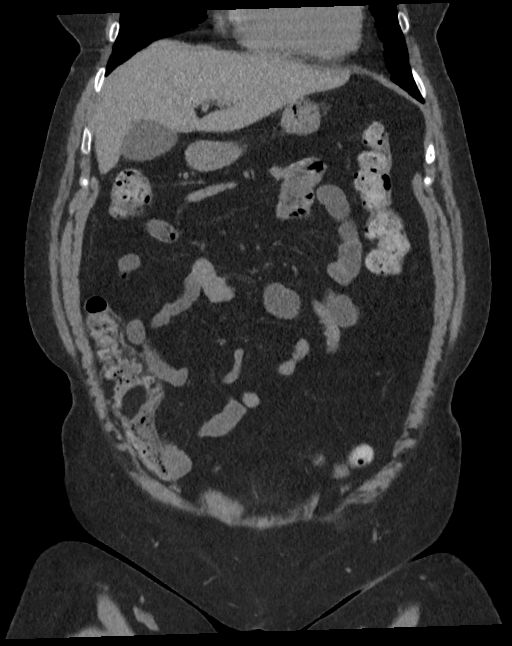
[im 71/159  soft-tissue]
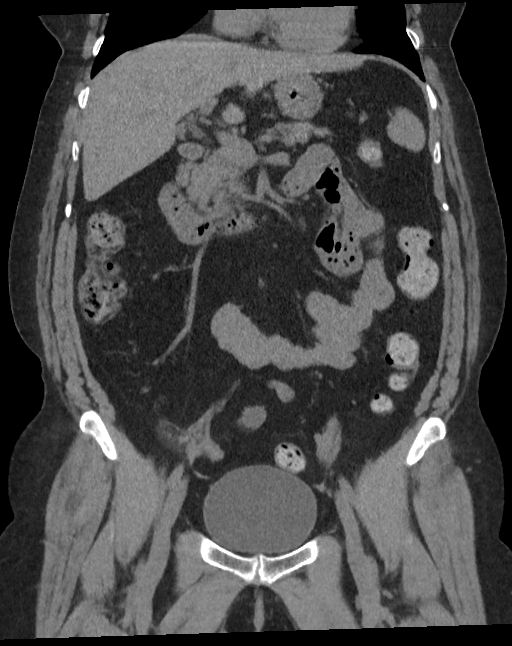
[im 88/159  soft-tissue]
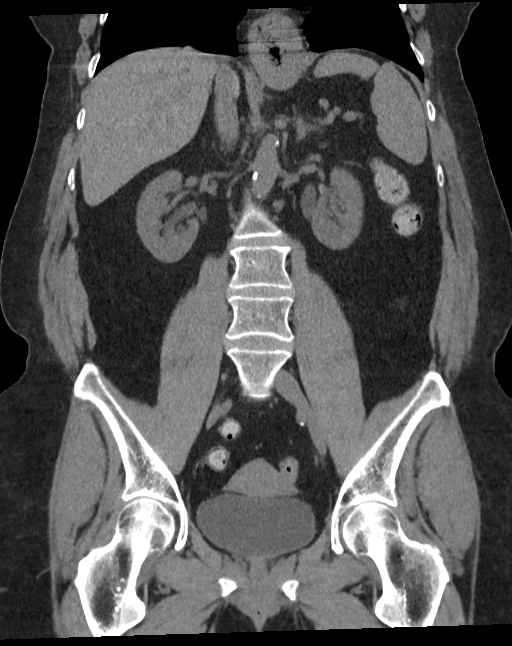

[16 of 46 positions shown; findings below may reference images not displayed]

FINDINGS: Evaluation of this exam is limited in the absence of intravenous
contrast.

Lower chest: The visualized lung bases are clear.

No intra-abdominal free air or free fluid.

Hepatobiliary: No focal liver abnormality is seen. No gallstones,
gallbladder wall thickening, or biliary dilatation.

Pancreas: Unremarkable. No pancreatic ductal dilatation or
surrounding inflammatory changes.

Spleen: Normal in size without focal abnormality.

Adrenals/Urinary Tract: The adrenal glands unremarkable. There is no
hydronephrosis or nephrolithiasis on either side. Subcentimeter
partially exophytic right renal posterior interpolar hypodense
lesion is not characterized, possibly a cyst. The visualized ureters
and urinary bladder appear unremarkable.

Stomach/Bowel: There is a small hiatal hernia. There is moderate
stool throughout the colon. There is sigmoid diverticulosis without
active inflammatory changes. There is no bowel obstruction or active
inflammation. The appendix is normal.

Vascular/Lymphatic: Moderate aortoiliac atherosclerotic disease. The
IVC is unremarkable. No portal gas. There is no adenopathy.

Reproductive: The uterus is anteverted and grossly unremarkable. No
adnexal masses.

Other: Small fat containing umbilical hernia.

Musculoskeletal: Mild degenerative changes of the spine. No acute
osseous pathology.
IMPRESSION: 1. No acute intra-abdominal or pelvic pathology. No hydronephrosis
or nephrolithiasis.
2. Sigmoid diverticulosis. No bowel obstruction. Normal appendix.
3. Aortic Atherosclerosis ([id number].X).

## 2021-07-12 MED ORDER — BENZONATATE 200 MG PO CAPS: 200.0000 mg | ORAL_CAPSULE | Freq: Three times a day (TID) | ORAL | 0 refills | Status: DC | PRN

## 2021-07-12 MED ORDER — GUAIFENESIN ER 600 MG PO TB12: 600.0000 mg | ORAL_TABLET | Freq: Two times a day (BID) | ORAL | 0 refills | Status: DC

## 2021-07-12 MED ORDER — PREDNISONE 10 MG PO TABS: 20.0000 mg | ORAL_TABLET | Freq: Every day | ORAL | 0 refills | Status: DC

## 2021-07-12 MED ORDER — IPRATROPIUM-ALBUTEROL 0.5-2.5 (3) MG/3ML IN SOLN: 3.0000 mL | Freq: Four times a day (QID) | RESPIRATORY_TRACT | 2 refills | Status: DC | PRN

## 2021-07-12 MED ORDER — MONTELUKAST SODIUM 10 MG PO TABS: 10.0000 mg | ORAL_TABLET | Freq: Every day | ORAL | 0 refills | Status: DC

## 2021-07-12 NOTE — Progress Notes (Signed)
Received Md order to discharge patient to home, reviewed discharge instructions, home meds, prescriptions and follow up appointments with patient and patient verbalized understanding.     

## 2021-07-13 ENCOUNTER — Encounter: Payer: Self-pay | Admitting: Nurse Practitioner

## 2021-07-13 DIAGNOSIS — I700 Atherosclerosis of aorta: Secondary | ICD-10-CM | POA: Insufficient documentation

## 2021-07-13 NOTE — Discharge Summary (Signed)
Physician Discharge Summary  Audrey Richard JEH:[id number] DOB: August 02, 1961 DOA: 07/03/2021  PCP: Marjie Skiff, NP  Admit date: 07/03/2021 Discharge date: 07/12/2021  Time spent:  Recommendations for Outpatient Follow-up:  PCP in 1 week FU with Pulmonary in 39month   Discharge Diagnoses:  Principal Problem:   COPD exacerbation (HCC) Active Problems:   Depression   Insomnia   Stage 2 moderate COPD by GOLD classification (HCC)   Hypothyroidism   Mixed hyperlipidemia   Acid reflux  Discharge Condition: stable  Diet recommendation: low sodium  Filed Weights   07/03/21 2137  Weight: 87.4 kg    History of present illness:  Audrey Richard is a 60 y.o. female with PMH significant for COPD, mixed hyperlipidemia, depression, anxiety, hypothyroid, GERD, neuropathy. Patient presented to the ED on 7/13 with complaint of shortness of breath, ongoing for the last month, associated with cough, dark thick sputum, not responding to outpatient treatment by PCP with prednisone, inhaler, [facility] Course:   Acute exacerbation of COPD Acute respiratory failure with hypoxia Coughing spells -Presented with a month of shortness of breath, productive cough   -Treated for severe COPD exacerbation, using IV steroids, duo nebs, azithromycin -Clinically improving but was bothered by cough, improved significantly on regimen of steroids, Tessalon Perles, Tussionex, Claritin, Flonase  -Weaned off O2, still continues to complain of intermittent cough, clinically felt to be stable for discharge home, sent referral to Pulmonary   UTI -Complained of dysuria, urinalysis showed moderate leukocytes.  Completed 3-day course of IV Rocephin.   Chronic  diastolic CHF -Echocardiogram 7/14 showed EF of 50 to 55% no regional wall motion abnormality, grade 1 diastolic dysfunction, normal right ventricular size and systolic function. -Currently CHF seems compensated.  Not fluid  overloaded, not requiring diuretics.   Hyperlipidemia -rosuvastatin 20 mg nightly resumed  Depression/anxiety -bupropion 150 mg daily, fluoxetine 20 mg daily   Insomnia - Probably because of persistent cough.  Trazodone 50 mg nightly scheduled.  Hypothyroidism -levothyroxine 125 mcg daily before breakfast ordered  GERD -PPI   Generalized weakness -Encouraged ambulation  Discharge Exam: Vitals:   07/12/21 0756 07/12/21 1136  BP:  114/84  Pulse:  79  Resp:  18  Temp:  98.1 F (36.7 C)  SpO2: 97% 94%    General:AAOx3 Cardiovascular: S1S2/RRR Respiratory: significantly improved air movement, no wheezing  Discharge Instructions   Discharge Instructions     Ambulatory referral to Pulmonology   Complete by: As directed    Reason for referral: Asthma/COPD   Diet - low sodium heart healthy   Complete by: As directed    Increase activity slowly   Complete by: As directed       Allergies as of 07/12/2021       Reactions   Biaxin [clarithromycin]    Patient reports itching, nausea   Iodine    Iodine blisters skin        Medication List     TAKE these medications    albuterol 108 (90 Base) MCG/ACT inhaler Commonly known as: VENTOLIN HFA Inhale 2 puffs into the lungs every 6 (six) hours as needed for wheezing or shortness of breath.   azelastine 0.05 % ophthalmic solution Commonly known as: OPTIVAR Place 1 drop into both eyes 2 (two) times daily.   benzonatate 200 MG capsule Commonly known as: TESSALON Take 1 capsule (200 mg total) by mouth 3 (three) times daily as needed for cough. What changed:  medication strength how much to take  Breztri Aerosphere 160-9-4.8 MCG/ACT Aero Generic drug: Budeson-Glycopyrrol-Formoterol Inhale 2 puffs into the lungs 2 (two) times daily.   buPROPion 150 MG 24 hr tablet Commonly known as: WELLBUTRIN XL TAKE ONE TABLET BY MOUTH EVERY DAY   cetirizine 10 MG tablet Commonly known as: ZYRTEC TAKE ONE TABLET BY  MOUTH EVERY DAY   cyclobenzaprine 10 MG tablet Commonly known as: FLEXERIL Take 1 tablet (10 mg total) by mouth at bedtime.   FLUoxetine 20 MG tablet Commonly known as: PROZAC Take 1 tablet (20 mg total) by mouth daily.   gabapentin 300 MG capsule Commonly known as: NEURONTIN TAKE ONE CAPSULE BY MOUTH 3 TIMES A DAY   guaiFENesin 600 MG 12 hr tablet Commonly known as: Mucinex Take 1 tablet (600 mg total) by mouth 2 (two) times daily.   ipratropium-albuterol 0.5-2.5 (3) MG/3ML Soln Commonly known as: DUONEB Take 3 mLs by nebulization every 6 (six) hours as needed.   levothyroxine 125 MCG tablet Commonly known as: SYNTHROID Take 1 tablet (125 mcg total) by mouth daily.   montelukast 10 MG tablet Commonly known as: SINGULAIR Take 1 tablet (10 mg total) by mouth at bedtime.   omeprazole 40 MG capsule Commonly known as: PRILOSEC TAKE ONE CAPSULE BY MOUTH EVERY EVENING   predniSONE 10 MG tablet Commonly known as: DELTASONE Take 2 tablets (20 mg total) by mouth daily with breakfast for 4 days.   rosuvastatin 20 MG tablet Commonly known as: CRESTOR TAKE ONE TABLET BY MOUTH EVERY DAY   vitamin B-12 500 MCG tablet Commonly known as: CYANOCOBALAMIN Take 1 tablet (500 mcg total) by mouth daily.       ASK your doctor about these medications    Cholecalciferol 1.25 MG (50000 UT) Tabs Take 1 tablet by mouth once a week.   fluticasone 50 MCG/ACT nasal spray Commonly known as: FLONASE PLACE 2 SPRAYS INTO BOTH NOSTRILS EVERY DAY   traZODone 50 MG tablet Commonly known as: DESYREL TAKE ONE TABLET BY MOUTH AT BEDTIME AS NEEDED       Allergies  Allergen Reactions   Biaxin [Clarithromycin]     Patient reports itching, nausea   Iodine     Iodine blisters skin    Follow-up Information     Marjie Skiff, NP. Go on 07/17/2021.   Specialty: Nurse Practitioner Why: @3 :00pm Contact information: [address] [place] [phone]         [facility] Follow up.   Why: Office will call you for FU                 The results of significant diagnostics from this hospitalization (including imaging, microbiology, ancillary and laboratory) are listed below for reference.    Significant Diagnostic Studies: DG Chest 2 View  Result Date: 07/09/2021 CLINICAL DATA:  COPD exacerbation, shortness of breath EXAM: CHEST - 2 VIEW COMPARISON:  07/03/2021 FINDINGS: Normal heart size, mediastinal contours, and pulmonary vascularity. Bronchitic and emphysematous changes consistent with COPD. Minimal chronic interstitial prominence, stable. No acute infiltrate, pleural effusion, or pneumothorax. Old healed fractures of the posterolateral RIGHT seventh and eighth ribs. IMPRESSION: COPD changes. No acute abnormalities. Electronically Signed   By: 07/05/2021 M.D.   On: 07/09/2021 16:58   DG Chest 2 View  Result Date: 07/03/2021 CLINICAL DATA:  Cough EXAM: CHEST - 2 VIEW COMPARISON:  01/22/2021 FINDINGS: Bilateral chronic interstitial thickening. No focal consolidation. No pleural effusion or pneumothorax. Heart and mediastinal contours are unremarkable. No acute osseous abnormality.  Old healed bilateral rib fractures. IMPRESSION: No active cardiopulmonary disease. Electronically Signed   By: Elige Ko   On: 07/03/2021 09:36   ECHOCARDIOGRAM COMPLETE  Result Date: 07/05/2021    ECHOCARDIOGRAM REPORT   Patient Name:   Audrey Richard [facility] Date of Exam: 07/04/2021 Medical Rec #:  [id number]        Height:       67.0 in Accession #:    [id number]       Weight:       192.7 lb Date of Birth:  1961-04-18        BSA:          1.990 m Patient Age:    59 years         BP:           122/64 mmHg Patient Gender: F                HR:           80 bpm. Exam Location:  [facility] Procedure: 2D Echo, Cardiac Doppler and Color Doppler Indications:     Abnormal ECG 794.31 / R94.31  History:         Patient has no prior history of Echocardiogram examinations.                   COPD. High cholesterol.  Sonographer:     Cristela Blue RDCS (AE) Referring Phys:  [id number] [facility] Diagnosing Phys: Adrian Blackwater MD  Sonographer Comments: Suboptimal apical window. IMPRESSIONS  1. Left ventricular ejection fraction, by estimation, is 50 to 55%. The left ventricle has low normal function. The left ventricle has no regional wall motion abnormalities. Left ventricular diastolic parameters are consistent with Grade I diastolic dysfunction (impaired relaxation).  2. Right ventricular systolic function is normal. The right ventricular size is normal.  3. The mitral valve is normal in structure. No evidence of mitral valve regurgitation. No evidence of mitral stenosis.  4. The aortic valve is normal in structure. Aortic valve regurgitation is not visualized. No aortic stenosis is present.  5. The inferior vena cava is normal in size with greater than 50% respiratory variability, suggesting right atrial pressure of 3 mmHg. FINDINGS  Left Ventricle: Left ventricular ejection fraction, by estimation, is 50 to 55%. The left ventricle has low normal function. The left ventricle has no regional wall motion abnormalities. The left ventricular internal cavity size was normal in size. There is no left ventricular hypertrophy. Left ventricular diastolic parameters are consistent with Grade I diastolic dysfunction (impaired relaxation). Right Ventricle: The right ventricular size is normal. No increase in right ventricular wall thickness. Right ventricular systolic function is normal. Left Atrium: Left atrial size was normal in size. Right Atrium: Right atrial size was normal in size. Pericardium: There is no evidence of pericardial effusion. Mitral Valve: The mitral valve is normal in structure. No evidence of mitral valve regurgitation. No evidence of mitral valve stenosis. Tricuspid Valve: The tricuspid valve is normal in structure. Tricuspid valve regurgitation is not demonstrated. No evidence of tricuspid  stenosis. Aortic Valve: The aortic valve is normal in structure. Aortic valve regurgitation is not visualized. No aortic stenosis is present. Aortic valve mean gradient measures 3.0 mmHg. Aortic valve peak gradient measures 6.1 mmHg. Aortic valve area, by VTI measures 3.64 cm. Pulmonic Valve: The pulmonic valve was normal in structure. Pulmonic valve regurgitation is not visualized. No evidence of pulmonic stenosis. Aorta: The aortic root is normal in size and  structure. Venous: The inferior vena cava is normal in size with greater than 50% respiratory variability, suggesting right atrial pressure of 3 mmHg. IAS/Shunts: No atrial level shunt detected by color flow Doppler.  LEFT VENTRICLE PLAX 2D LVIDd:         4.62 cm  Diastology LVIDs:         2.67 cm  LV e' medial:    7.40 cm/s LV PW:         0.81 cm  LV E/e' medial:  9.0 LV IVS:        0.86 cm  LV e' lateral:   11.40 cm/s LVOT diam:     2.30 cm  LV E/e' lateral: 5.9 LV SV:         88 LV SV Index:   44 LVOT Area:     4.15 cm  RIGHT VENTRICLE RV Basal diam:  4.23 cm RV S prime:     14.90 cm/s TAPSE (M-mode): 4.2 cm LEFT ATRIUM             Index       RIGHT ATRIUM           Index LA diam:        3.90 cm 1.96 cm/m  RA Area:     21.10 cm LA Vol (A2C):   41.4 ml 20.80 ml/m RA Volume:   61.80 ml  31.05 ml/m LA Vol (A4C):   41.4 ml 20.80 ml/m LA Biplane Vol: 42.5 ml 21.35 ml/m  AORTIC VALVE                   PULMONIC VALVE AV Area (Vmax):    3.04 cm    PV Vmax:        0.92 m/s AV Area (Vmean):   3.14 cm    PV Peak grad:   3.4 mmHg AV Area (VTI):     3.64 cm    RVOT Peak grad: 5 mmHg AV Vmax:           123.00 cm/s AV Vmean:          80.800 cm/s AV VTI:            0.243 m AV Peak Grad:      6.1 mmHg AV Mean Grad:      3.0 mmHg LVOT Vmax:         90.10 cm/s LVOT Vmean:        61.100 cm/s LVOT VTI:          0.213 m LVOT/AV VTI ratio: 0.88  AORTA Ao Root diam: 3.27 cm MITRAL VALVE               TRICUSPID VALVE MV Area (PHT): 4.99 cm    TR Peak grad:   10.5 mmHg  MV Decel Time: 152 msec    TR Vmax:        162.00 cm/s MV E velocity: 66.80 cm/s MV A velocity: 59.60 cm/s  SHUNTS MV E/A ratio:  1.12        Systemic VTI:  0.21 m                            Systemic Diam: 2.30 cm Adrian BlackwaterShaukat Khan MD Electronically signed by Adrian BlackwaterShaukat Khan MD Signature Date/Time: 07/05/2021/9:01:59 AM    Final     Microbiology: No results found for this or any previous visit (from the past 240 hour(s)).   Labs: Basic Metabolic  Panel: Recent Labs  Lab 07/07/21 0453 07/09/21 0545  NA 141 140  K 4.1 4.1  CL 105 103  CO2 30 28  GLUCOSE 99 92  BUN 23* 24*  CREATININE 1.18* 0.97  CALCIUM 8.6* 8.7*   Liver Function Tests: No results for input(s): AST, ALT, ALKPHOS, BILITOT, PROT, ALBUMIN in the last 168 hours. No results for input(s): LIPASE, AMYLASE in the last 168 hours. No results for input(s): AMMONIA in the last 168 hours. CBC: Recent Labs  Lab 07/07/21 0453 07/09/21 0545  WBC 7.9 10.4  NEUTROABS 5.2 7.7  HGB 12.9 13.1  HCT 39.1 39.8  MCV 95.1 94.5  PLT 263 274   Cardiac Enzymes: No results for input(s): CKTOTAL, CKMB, CKMBINDEX, TROPONINI in the last 168 hours. BNP: BNP (last 3 results) Recent Labs    07/03/21 1120 07/09/21 1455  BNP 88.0 43.3    ProBNP (last 3 results) No results for input(s): PROBNP in the last 8760 hours.  CBG: No results for input(s): GLUCAP in the last 168 hours.     Signed:  Zannie Cove MD.  [facility] 07/13/2021, 4:50 PM

## 2021-07-16 ENCOUNTER — Ambulatory Visit: Payer: BC Managed Care – PPO | Admitting: Nurse Practitioner

## 2021-07-16 NOTE — Telephone Encounter (Signed)
Patient declines rescheduling  

## 2021-07-16 NOTE — Chronic Care Management (AMB) (Signed)
  Care Management   Note  07/16/2021 Name: [name] MRN: [id number] DOB: 03-19-1961  Audrey Richard is a 60 y.o. year old female who is a primary care patient of Marjie Skiff, NP and is actively engaged with the care management team. I reached out to Audrey Richard by phone today to assist with re-scheduling an initial visit with the RN Case Manager  Follow up plan: Patient declines further follow up and engagement by the care management team. Appropriate care team members and provider have been notified via electronic communication.   Penne Lash, RMA Care Guide, Embedded Care Coordination [facility]  [place], [place] [address] Direct Dial: [phone] [email or web address]

## 2021-07-17 ENCOUNTER — Other Ambulatory Visit: Payer: Self-pay

## 2021-07-17 ENCOUNTER — Ambulatory Visit (INDEPENDENT_AMBULATORY_CARE_PROVIDER_SITE_OTHER): Payer: BC Managed Care – PPO | Admitting: Nurse Practitioner

## 2021-07-17 ENCOUNTER — Encounter: Payer: Self-pay | Admitting: Nurse Practitioner

## 2021-07-17 VITALS — BP 116/77 | HR 83 | Temp 99.0°F | Wt 192.6 lb

## 2021-07-17 DIAGNOSIS — D729 Disorder of white blood cells, unspecified: Secondary | ICD-10-CM | POA: Diagnosis not present

## 2021-07-17 DIAGNOSIS — J441 Chronic obstructive pulmonary disease with (acute) exacerbation: Secondary | ICD-10-CM

## 2021-07-17 DIAGNOSIS — J449 Chronic obstructive pulmonary disease, unspecified: Secondary | ICD-10-CM

## 2021-07-17 DIAGNOSIS — Z0000 Encounter for general adult medical examination without abnormal findings: Secondary | ICD-10-CM

## 2021-07-17 DIAGNOSIS — I700 Atherosclerosis of aorta: Secondary | ICD-10-CM | POA: Diagnosis not present

## 2021-07-17 MED ORDER — BENZONATATE 100 MG PO CAPS: 100.0000 mg | ORAL_CAPSULE | Freq: Three times a day (TID) | ORAL | 1 refills | Status: DC | PRN

## 2021-07-17 NOTE — Assessment & Plan Note (Signed)
Recheck CBC today. 

## 2021-07-17 NOTE — Assessment & Plan Note (Signed)
Chronic, ongoing, on review CXR in past bronchitic changes noted -- reviewed this with patient and educated her on COPD, do suspect some underlying emphysema due to past smoking. Currently FEV1 43%, some decline since past check.  Continue Breztri which was started at recent visit with PCP, educated her on this and provided her sample last visit to start in office.  Continue Ventolin as needed + nebulizers.  CCM referral placed initial visit to discuss costs, but does not qualify.  Will collaborate with pulmonary, appreciate their input.  Return in 4 weeks.

## 2021-07-17 NOTE — Patient Instructions (Signed)

## 2021-07-17 NOTE — Assessment & Plan Note (Signed)
Recent with some improvement, but not 100%.  Sees pulmonary for initial visit tomorrow, appreciate their input.  Would benefit further testing -- recent Alpha 1 Antitrypsin normal range.  Continue Breztri and nebulizer at this time.  CBC and CMP today.  Refill on Tessalon sent.  Return in 4 weeks.

## 2021-07-17 NOTE — Progress Notes (Signed)
BP 116/77   Pulse 83   Temp 99 F (37.2 C) (Oral)   Wt 192 lb 9.6 oz (87.4 kg)   LMP  (LMP Unknown)   SpO2 96%   BMI 30.17 kg/m    Subjective:    Patient ID: Audrey Richard, female    DOB: 10-09-1961, 60 y.o.   MRN: [id number]  HPI: Audrey Richard is a 60 y.o. female  Chief Complaint  Patient presents with   Hospitalization Follow-up   COPD    Patient states she was hospitalized as she has fluid (infection) coming up from her lungs she states they called it "lung ejaculation." Patient states she has an appointment with her Pulmonologist tomorrow and has some paperwork and does not know who should complete it for.    Transition of Care Hospital Follow up.   History of present illness:  Follow-up for COPD exacerbation with hospitalization.  Initially treated on 06/18/21 in PCP office with worsening symptoms that led to hospitalization on 07/03/21.  She is scheduled to see lung provider tomorrow for initial visit, Dr. Sherene Sires.  Currently using Breztri daily and nebulizer.  Continues to have cough, fatigue, and hoarseness.  Not coughing up and phlegm at this time.   She quit smoking 30 years ago, does live with a smoker but they have to go outside.  She did work in [employer] setting in past with chemicals -- [employer].  At this time continues to have fatigue and occasional headaches from cough.  "Audrey Richard is a 60 y.o. female with PMH  significant for COPD, mixed hyperlipidemia,  depression, anxiety, hypothyroid, GERD, neuropathy. Patient presented to the ED on 7/13 with complaint of shortness of breath, ongoing for the last month, associated with cough, dark thick sputum, not responding to outpatient treatment by PCP with prednisone, inhaler, [facility] Course:    Acute exacerbation of COPD Acute respiratory failure with hypoxia Coughing spells -Presented with a month of shortness of breath, productive cough   -Treated for severe COPD  exacerbation, using IV steroids, duo nebs, azithromycin -Clinically improving but was bothered by cough, improved significantly on regimen of steroids, Tessalon Perles, Tussionex, Claritin, Flonase  -Weaned off O2, still continues to complain of intermittent cough, clinically felt to be stable for discharge home, sent referral to Pulmonary   UTI -Complained of dysuria, urinalysis showed moderate leukocytes.  Completed 3-day course of IV Rocephin.   Chronic  diastolic CHF -Echocardiogram 7/14 showed EF of 50 to 55% no regional wall motion abnormality, grade 1 diastolic dysfunction, normal right ventricular size and systolic function. -Currently CHF seems compensated.  Not fluid overloaded, not requiring diuretics.   Hyperlipidemia -rosuvastatin 20 mg nightly resumed  Depression/anxiety -bupropion 150 mg daily, fluoxetine 20 mg daily   Insomnia - Probably because of persistent cough.  Trazodone 50 mg nightly scheduled.  Hypothyroidism -levothyroxine 125 mcg daily before breakfast ordered  GERD -PPI"  "Recommendations for Outpatient Follow-up:  PCP in 1 week FU with Pulmonary in 98month     Discharge Diagnoses:  Principal Problem:   COPD exacerbation (HCC) Active Problems:   Depression   Insomnia   Stage 2 moderate COPD by GOLD classification (HCC)   Hypothyroidism   Mixed hyperlipidemia   Acid reflux   Discharge Condition: stable   Diet recommendation: low sodium      Filed Weights    07/03/21 2137  Weight: 87.4 kg    Hospital/Facility: [facility] D/C Physician: Dr. Joya Martyr D/C Date: 07/12/21  Records Requested: 07/17/21 Records Received: 07/17/21 Records Reviewed: 07/17/21  Diagnoses on Discharge:  Acute exacerbation of COPD Acute respiratory failure with hypoxia  Date of interactive Contact within 48 hours of discharge:  Contact was through: phone  Date of 7 day or 14 day face-to-face visit:    within 7 days  Outpatient Encounter Medications as of 07/17/2021   Medication Sig Note   albuterol (VENTOLIN HFA) 108 (90 Base) MCG/ACT inhaler Inhale 2 puffs into the lungs every 6 (six) hours as needed for wheezing or shortness of breath.    benzonatate (TESSALON PERLES) 100 MG capsule Take 1 capsule (100 mg total) by mouth 3 (three) times daily as needed for cough.    Budeson-Glycopyrrol-Formoterol (BREZTRI AEROSPHERE) 160-9-4.8 MCG/ACT AERO Inhale 2 puffs into the lungs 2 (two) times daily.    buPROPion (WELLBUTRIN XL) 150 MG 24 hr tablet TAKE ONE TABLET BY MOUTH EVERY DAY    cetirizine (ZYRTEC) 10 MG tablet TAKE ONE TABLET BY MOUTH EVERY DAY    Cholecalciferol 1.25 MG (50000 UT) TABS Take 1 tablet by mouth once a week. (Patient taking differently: Take 1 tablet by mouth every Monday.)    cyclobenzaprine (FLEXERIL) 10 MG tablet Take 1 tablet (10 mg total) by mouth at bedtime.    FLUoxetine (PROZAC) 20 MG tablet Take 1 tablet (20 mg total) by mouth daily.    fluticasone (FLONASE) 50 MCG/ACT nasal spray PLACE 2 SPRAYS INTO BOTH NOSTRILS EVERY DAY    gabapentin (NEURONTIN) 300 MG capsule TAKE ONE CAPSULE BY MOUTH 3 TIMES A DAY    guaiFENesin (MUCINEX) 600 MG 12 hr tablet Take 1 tablet (600 mg total) by mouth 2 (two) times daily.    ipratropium-albuterol (DUONEB) 0.5-2.5 (3) MG/3ML SOLN Take 3 mLs by nebulization every 6 (six) hours as needed.    levothyroxine (SYNTHROID) 125 MCG tablet Take 1 tablet (125 mcg total) by mouth daily.    montelukast (SINGULAIR) 10 MG tablet Take 1 tablet (10 mg total) by mouth at bedtime.    omeprazole (PRILOSEC) 40 MG capsule TAKE ONE CAPSULE BY MOUTH EVERY EVENING    rosuvastatin (CRESTOR) 20 MG tablet TAKE ONE TABLET BY MOUTH EVERY DAY    traZODone (DESYREL) 50 MG tablet TAKE ONE TABLET BY MOUTH AT BEDTIME AS NEEDED (Patient taking differently: Take 25 mg by mouth at bedtime as needed.)    vitamin B-12 (CYANOCOBALAMIN) 500 MCG tablet Take 1 tablet (500 mcg total) by mouth daily.    [DISCONTINUED] azelastine (OPTIVAR) 0.05 %  ophthalmic solution Place 1 drop into both eyes 2 (two) times daily. (Patient not taking: Reported on 07/17/2021) 01/16/2021: Pt out of medication   [DISCONTINUED] benzonatate (TESSALON) 200 MG capsule Take 1 capsule (200 mg total) by mouth 3 (three) times daily as needed for cough. (Patient not taking: Reported on 07/17/2021)    [DISCONTINUED] predniSONE (DELTASONE) 10 MG tablet Take 2 tablets (20 mg total) by mouth daily with breakfast for 4 days. (Patient not taking: Reported on 07/17/2021)    No facility-administered encounter medications on file as of 07/17/2021.    Diagnostic Tests Reviewed/Disposition: Recent labs and imaging reviewed.  CLINICAL DATA:  COPD exacerbation, shortness of breath   EXAM: CHEST - 2 VIEW   COMPARISON:  07/03/2021   FINDINGS: Normal heart size, mediastinal contours, and pulmonary vascularity.   Bronchitic and emphysematous changes consistent with COPD.   Minimal chronic interstitial prominence, stable.   No acute infiltrate, pleural effusion, or pneumothorax.   Old healed fractures of the posterolateral  RIGHT seventh and eighth ribs.   IMPRESSION: COPD changes.  Consults: Pulmonary  Discharge Instructions: Follow-up with PCP and Pulmonary  Disease/illness Education: Reviewed with patient progressive nature of COPD  Home Health/Community Services Discussions/Referrals: None  Establishment or re-establishment of referral orders for community resources: None  Discussion with other health care providers: Reviewed recent notes  Assessment and Support of treatment regimen adherence: Discussed at length with patients  Appointments Coordinated with: Discussed at length with patients  Education for self-management, independent living, and ADLs:  Discussed at length with patients  Relevant past medical, surgical, family and social history reviewed and updated as indicated. Interim medical history since our last visit reviewed. Allergies and  medications reviewed and updated.  Review of Systems  Constitutional:  Positive for fatigue. Negative for activity change, appetite change, diaphoresis and fever.  Respiratory:  Positive for cough. Negative for chest tightness and shortness of breath.   Cardiovascular:  Negative for chest pain, palpitations and leg swelling.  Gastrointestinal: Negative.   Endocrine: Negative.   Neurological:  Positive for headaches. Negative for dizziness, syncope, weakness, light-headedness and numbness.  Psychiatric/Behavioral: Negative.     Per HPI unless specifically indicated above     Objective:    BP 116/77   Pulse 83   Temp 99 F (37.2 C) (Oral)   Wt 192 lb 9.6 oz (87.4 kg)   LMP  (LMP Unknown)   SpO2 96%   BMI 30.17 kg/m   Wt Readings from Last 3 Encounters:  07/17/21 192 lb 9.6 oz (87.4 kg)  07/03/21 192 lb 10.9 oz (87.4 kg)  06/18/21 199 lb 3.2 oz (90.4 kg)    Physical Exam Vitals and nursing note reviewed.  Constitutional:      General: She is awake. She is not in acute distress.    Appearance: She is well-developed and well-groomed. She is ill-appearing. She is not toxic-appearing.  HENT:     Head: Normocephalic.     Right Ear: Hearing, tympanic membrane, ear canal and external ear normal.     Left Ear: Hearing, tympanic membrane, ear canal and external ear normal.     Nose: Nose normal.     Mouth/Throat:     Mouth: Mucous membranes are moist.  Eyes:     General: Lids are normal.        Right eye: No discharge.        Left eye: No discharge.     Conjunctiva/sclera: Conjunctivae normal.     Pupils: Pupils are equal, round, and reactive to light.  Neck:     Thyroid: No thyromegaly.     Vascular: No carotid bruit.  Cardiovascular:     Rate and Rhythm: Normal rate and regular rhythm.     Heart sounds: Normal heart sounds. No murmur heard.   No gallop.  Pulmonary:     Effort: Pulmonary effort is normal. No accessory muscle usage or respiratory distress.     Breath  sounds: Decreased breath sounds and wheezing present.     Comments: Hoarseness noted.  Occasional dry cough.  Lung sounds diminished throughout with occasional expiratory wheezing noted.  No tripod posture. Abdominal:     General: Bowel sounds are normal.     Palpations: Abdomen is soft.  Musculoskeletal:     Cervical back: Normal range of motion and neck supple.     Right lower leg: No edema.     Left lower leg: No edema.  Lymphadenopathy:     Head:  Right side of head: No submental, submandibular, tonsillar, preauricular or posterior auricular adenopathy.     Left side of head: No submental, submandibular, tonsillar, preauricular or posterior auricular adenopathy.     Cervical: No cervical adenopathy.  Skin:    General: Skin is warm and dry.  Neurological:     Mental Status: She is alert and oriented to person, place, and time.  Psychiatric:        Attention and Perception: Attention normal.        Mood and Affect: Mood normal.        Speech: Speech normal.        Behavior: Behavior normal. Behavior is cooperative.        Thought Content: Thought content normal.    Results for orders placed or performed during the hospital encounter of 07/03/21  Expectorated Sputum Assessment w Gram Stain, Rflx to Resp Cult   Specimen: Sputum  Result Value Ref Range   Specimen Description SPUTUM    Special Requests NONE    Sputum evaluation      THIS SPECIMEN IS ACCEPTABLE FOR SPUTUM CULTURE Performed at [facility], [address], [place], [place] [address]    Report Status 07/03/2021 FINAL   Resp Panel by RT-PCR (Flu A&B, Covid) Nasopharyngeal Swab   Specimen: Nasopharyngeal Swab; Nasopharyngeal(NP) swabs in vial transport medium  Result Value Ref Range   SARS Coronavirus 2 by RT PCR NEGATIVE NEGATIVE   Influenza A by PCR NEGATIVE NEGATIVE   Influenza B by PCR NEGATIVE NEGATIVE  Culture, Respiratory w Gram Stain   Specimen: SPU  Result Value Ref Range   Specimen  Description      SPUTUM Performed at [facility], [address], [place], [place] [address]    Special Requests      NONE Reflexed from [id number] Performed at [facility], [address] [place], [place] [address]    Gram Stain      RARE WBC PRESENT, PREDOMINANTLY PMN FEW GRAM POSITIVE COCCI FEW GRAM NEGATIVE RODS    Culture      FEW Normal respiratory flora-no Staph aureus or Pseudomonas seen Performed at [facility], [address] [place], [place] [address]    Report Status 07/06/2021 FINAL   Comprehensive metabolic panel  Result Value Ref Range   Sodium 137 135 - 145 mmol/L   Potassium 3.7 3.5 - 5.1 mmol/L   Chloride 104 98 - 111 mmol/L   CO2 21 (L) 22 - 32 mmol/L   Glucose, Bld 97 70 - 99 mg/dL   BUN 10 6 - 20 mg/dL   Creatinine, Ser 1.93 0.44 - 1.00 mg/dL   Calcium 8.6 (L) 8.9 - 10.3 mg/dL   Total Protein 7.1 6.5 - 8.1 g/dL   Albumin 3.4 (L) 3.5 - 5.0 g/dL   AST 25 15 - 41 U/L   ALT 31 0 - 44 U/L   Alkaline Phosphatase 49 38 - 126 U/L   Total Bilirubin 0.9 0.3 - 1.2 mg/dL   GFR, Estimated >79 >02 mL/min   Anion gap 12 5 - 15  CBC with Differential  Result Value Ref Range   WBC 15.3 (H) 4.0 - 10.5 K/uL   RBC 4.16 3.87 - 5.11 MIL/uL   Hemoglobin 13.2 12.0 - 15.0 g/dL   HCT 40.9 73.5 - 32.9 %   MCV 92.8 80.0 - 100.0 fL   MCH 31.7 26.0 - 34.0 pg   MCHC 34.2 30.0 - 36.0 g/dL  RDW 12.9 11.5 - 15.5 %   Platelets 248 150 - 400 K/uL   nRBC 0.0 0.0 - 0.2 %   Neutrophils Relative % 84 %   Neutro Abs 13.0 (H) 1.7 - 7.7 K/uL   Lymphocytes Relative 7 %   Lymphs Abs 1.0 0.7 - 4.0 K/uL   Monocytes Relative 7 %   Monocytes Absolute 1.0 0.1 - 1.0 K/uL   Eosinophils Relative 1 %   Eosinophils Absolute 0.2 0.0 - 0.5 K/uL   Basophils Relative 0 %   Basophils Absolute 0.0 0.0 - 0.1 K/uL   Immature Granulocytes 1 %   Abs Immature Granulocytes 0.09 (H) 0.00 - 0.07 K/uL  Procalcitonin - Baseline  Result Value Ref Range   Procalcitonin <0.10  ng/mL  Urinalysis, Complete w Microscopic  Result Value Ref Range   Color, Urine YELLOW (A) YELLOW   APPearance HAZY (A) CLEAR   Specific Gravity, Urine 1.015 1.005 - 1.030   pH 5.0 5.0 - 8.0   Glucose, UA NEGATIVE NEGATIVE mg/dL   Hgb urine dipstick MODERATE (A) NEGATIVE   Bilirubin Urine NEGATIVE NEGATIVE   Ketones, ur 20 (A) NEGATIVE mg/dL   Protein, ur NEGATIVE NEGATIVE mg/dL   Nitrite NEGATIVE NEGATIVE   Leukocytes,Ua MODERATE (A) NEGATIVE   RBC / HPF 11-20 0 - 5 RBC/hpf   WBC, UA 6-10 0 - 5 WBC/hpf   Bacteria, UA RARE (A) NONE SEEN   Squamous Epithelial / LPF 0-5 0 - 5   Mucus PRESENT    Hyaline Casts, UA PRESENT   Brain natriuretic peptide  Result Value Ref Range   B Natriuretic Peptide 88.0 0.0 - 100.0 pg/mL  HIV Antibody (routine testing w rflx)  Result Value Ref Range   HIV Screen 4th Generation wRfx Non Reactive Non Reactive  Basic metabolic panel  Result Value Ref Range   Sodium 139 135 - 145 mmol/L   Potassium 4.0 3.5 - 5.1 mmol/L   Chloride 108 98 - 111 mmol/L   CO2 23 22 - 32 mmol/L   Glucose, Bld 142 (H) 70 - 99 mg/dL   BUN 14 6 - 20 mg/dL   Creatinine, Ser 1.610.88 0.44 - 1.00 mg/dL   Calcium 9.0 8.9 - 09.610.3 mg/dL   GFR, Estimated >04>60 >54>60 mL/min   Anion gap 8 5 - 15  CBC  Result Value Ref Range   WBC 10.8 (H) 4.0 - 10.5 K/uL   RBC 4.04 3.87 - 5.11 MIL/uL   Hemoglobin 12.7 12.0 - 15.0 g/dL   HCT 09.838.0 11.936.0 - 14.746.0 %   MCV 94.1 80.0 - 100.0 fL   MCH 31.4 26.0 - 34.0 pg   MCHC 33.4 30.0 - 36.0 g/dL   RDW 82.912.9 56.211.5 - 13.015.5 %   Platelets 254 150 - 400 K/uL   nRBC 0.0 0.0 - 0.2 %  Basic metabolic panel  Result Value Ref Range   Sodium 139 135 - 145 mmol/L   Potassium 4.8 3.5 - 5.1 mmol/L   Chloride 106 98 - 111 mmol/L   CO2 25 22 - 32 mmol/L   Glucose, Bld 149 (H) 70 - 99 mg/dL   BUN 20 6 - 20 mg/dL   Creatinine, Ser 8.650.95 0.44 - 1.00 mg/dL   Calcium 9.0 8.9 - 78.410.3 mg/dL   GFR, Estimated >69>60 >62>60 mL/min   Anion gap 8 5 - 15  CBC with  Differential/Platelet  Result Value Ref Range   WBC 11.8 (H) 4.0 - 10.5 K/uL   RBC  4.23 3.87 - 5.11 MIL/uL   Hemoglobin 13.2 12.0 - 15.0 g/dL   HCT 40.9 81.1 - 91.4 %   MCV 96.2 80.0 - 100.0 fL   MCH 31.2 26.0 - 34.0 pg   MCHC 32.4 30.0 - 36.0 g/dL   RDW 78.2 95.6 - 21.3 %   Platelets 257 150 - 400 K/uL   nRBC 0.0 0.0 - 0.2 %   Neutrophils Relative % 92 %   Neutro Abs 10.8 (H) 1.7 - 7.7 K/uL   Lymphocytes Relative 6 %   Lymphs Abs 0.7 0.7 - 4.0 K/uL   Monocytes Relative 2 %   Monocytes Absolute 0.2 0.1 - 1.0 K/uL   Eosinophils Relative 0 %   Eosinophils Absolute 0.0 0.0 - 0.5 K/uL   Basophils Relative 0 %   Basophils Absolute 0.0 0.0 - 0.1 K/uL   Immature Granulocytes 0 %   Abs Immature Granulocytes 0.05 0.00 - 0.07 K/uL  Magnesium  Result Value Ref Range   Magnesium 2.3 1.7 - 2.4 mg/dL  Phosphorus  Result Value Ref Range   Phosphorus 4.1 2.5 - 4.6 mg/dL  Basic metabolic panel  Result Value Ref Range   Sodium 140 135 - 145 mmol/L   Potassium 4.3 3.5 - 5.1 mmol/L   Chloride 103 98 - 111 mmol/L   CO2 27 22 - 32 mmol/L   Glucose, Bld 111 (H) 70 - 99 mg/dL   BUN 24 (H) 6 - 20 mg/dL   Creatinine, Ser 0.86 (H) 0.44 - 1.00 mg/dL   Calcium 9.0 8.9 - 57.8 mg/dL   GFR, Estimated >46 >96 mL/min   Anion gap 10 5 - 15  CBC with Differential/Platelet  Result Value Ref Range   WBC 11.3 (H) 4.0 - 10.5 K/uL   RBC 4.06 3.87 - 5.11 MIL/uL   Hemoglobin 12.7 12.0 - 15.0 g/dL   HCT 29.5 28.4 - 13.2 %   MCV 95.3 80.0 - 100.0 fL   MCH 31.3 26.0 - 34.0 pg   MCHC 32.8 30.0 - 36.0 g/dL   RDW 44.0 10.2 - 72.5 %   Platelets 279 150 - 400 K/uL   nRBC 0.0 0.0 - 0.2 %   Neutrophils Relative % 89 %   Neutro Abs 10.1 (H) 1.7 - 7.7 K/uL   Lymphocytes Relative 7 %   Lymphs Abs 0.8 0.7 - 4.0 K/uL   Monocytes Relative 3 %   Monocytes Absolute 0.4 0.1 - 1.0 K/uL   Eosinophils Relative 0 %   Eosinophils Absolute 0.0 0.0 - 0.5 K/uL   Basophils Relative 0 %   Basophils Absolute 0.0 0.0 - 0.1  K/uL   Immature Granulocytes 1 %   Abs Immature Granulocytes 0.08 (H) 0.00 - 0.07 K/uL  Basic metabolic panel  Result Value Ref Range   Sodium 141 135 - 145 mmol/L   Potassium 4.1 3.5 - 5.1 mmol/L   Chloride 105 98 - 111 mmol/L   CO2 30 22 - 32 mmol/L   Glucose, Bld 99 70 - 99 mg/dL   BUN 23 (H) 6 - 20 mg/dL   Creatinine, Ser 3.66 (H) 0.44 - 1.00 mg/dL   Calcium 8.6 (L) 8.9 - 10.3 mg/dL   GFR, Estimated 53 (L) >60 mL/min   Anion gap 6 5 - 15  CBC with Differential/Platelet  Result Value Ref Range   WBC 7.9 4.0 - 10.5 K/uL   RBC 4.11 3.87 - 5.11 MIL/uL   Hemoglobin 12.9 12.0 - 15.0 g/dL   HCT  39.1 36.0 - 46.0 %   MCV 95.1 80.0 - 100.0 fL   MCH 31.4 26.0 - 34.0 pg   MCHC 33.0 30.0 - 36.0 g/dL   RDW 14.9 70.2 - 63.7 %   Platelets 263 150 - 400 K/uL   nRBC 0.0 0.0 - 0.2 %   Neutrophils Relative % 67 %   Neutro Abs 5.2 1.7 - 7.7 K/uL   Lymphocytes Relative 24 %   Lymphs Abs 1.9 0.7 - 4.0 K/uL   Monocytes Relative 7 %   Monocytes Absolute 0.6 0.1 - 1.0 K/uL   Eosinophils Relative 1 %   Eosinophils Absolute 0.1 0.0 - 0.5 K/uL   Basophils Relative 0 %   Basophils Absolute 0.0 0.0 - 0.1 K/uL   Immature Granulocytes 1 %   Abs Immature Granulocytes 0.10 (H) 0.00 - 0.07 K/uL  CBC with Differential/Platelet  Result Value Ref Range   WBC 10.4 4.0 - 10.5 K/uL   RBC 4.21 3.87 - 5.11 MIL/uL   Hemoglobin 13.1 12.0 - 15.0 g/dL   HCT 85.8 85.0 - 27.7 %   MCV 94.5 80.0 - 100.0 fL   MCH 31.1 26.0 - 34.0 pg   MCHC 32.9 30.0 - 36.0 g/dL   RDW 41.2 87.8 - 67.6 %   Platelets 274 150 - 400 K/uL   nRBC 0.0 0.0 - 0.2 %   Neutrophils Relative % 75 %   Neutro Abs 7.7 1.7 - 7.7 K/uL   Lymphocytes Relative 17 %   Lymphs Abs 1.8 0.7 - 4.0 K/uL   Monocytes Relative 5 %   Monocytes Absolute 0.5 0.1 - 1.0 K/uL   Eosinophils Relative 1 %   Eosinophils Absolute 0.1 0.0 - 0.5 K/uL   Basophils Relative 0 %   Basophils Absolute 0.0 0.0 - 0.1 K/uL   Immature Granulocytes 2 %   Abs Immature  Granulocytes 0.20 (H) 0.00 - 0.07 K/uL  Basic metabolic panel  Result Value Ref Range   Sodium 140 135 - 145 mmol/L   Potassium 4.1 3.5 - 5.1 mmol/L   Chloride 103 98 - 111 mmol/L   CO2 28 22 - 32 mmol/L   Glucose, Bld 92 70 - 99 mg/dL   BUN 24 (H) 6 - 20 mg/dL   Creatinine, Ser 7.20 0.44 - 1.00 mg/dL   Calcium 8.7 (L) 8.9 - 10.3 mg/dL   GFR, Estimated >94 >70 mL/min   Anion gap 9 5 - 15  Brain natriuretic peptide  Result Value Ref Range   B Natriuretic Peptide 43.3 0.0 - 100.0 pg/mL  ECHOCARDIOGRAM COMPLETE  Result Value Ref Range   Weight 3,082.91 oz   Height 67 in   BP 122/64 mmHg   Ao pk vel 1.23 m/s   AV Area VTI 3.64 cm2   AR max vel 3.04 cm2   AV Mean grad 3.0 mmHg   AV Peak grad 6.1 mmHg   S' Lateral 2.67 cm   AV Area mean vel 3.14 cm2   Area-P 1/2 4.99 cm2  Troponin I (High Sensitivity)  Result Value Ref Range   Troponin I (High Sensitivity) 3 <18 ng/L  Troponin I (High Sensitivity)  Result Value Ref Range   Troponin I (High Sensitivity) 3 <18 ng/L      Assessment & Plan:   Problem List Items Addressed This Visit       Cardiovascular and Mediastinum   Aortic atherosclerosis (HCC)    Noted on imaging 12/30/2020 -- will recommend statin therapy and further  discuss next visit for prevention.         Respiratory   Stage 2 moderate COPD by GOLD classification (HCC)    Chronic, ongoing, on review CXR in past bronchitic changes noted -- reviewed this with patient and educated her on COPD, do suspect some underlying emphysema due to past smoking. Currently FEV1 43%, some decline since past check.  Continue Breztri which was started at recent visit with PCP, educated her on this and provided her sample last visit to start in office.  Continue Ventolin as needed + nebulizers.  CCM referral placed initial visit to discuss costs, but does not qualify.  Will collaborate with pulmonary, appreciate their input.  Return in 4 weeks.       Relevant Medications    benzonatate (TESSALON PERLES) 100 MG capsule   Other Relevant Orders   CBC with Differential/Platelet   Comprehensive metabolic panel   COPD exacerbation (HCC) - Primary    Recent with some improvement, but not 100%.  Sees pulmonary for initial visit tomorrow, appreciate their input.  Would benefit further testing -- recent Alpha 1 Antitrypsin normal range.  Continue Breztri and nebulizer at this time.  CBC and CMP today.  Refill on Tessalon sent.  Return in 4 weeks.       Relevant Medications   benzonatate (TESSALON PERLES) 100 MG capsule     Other   Health care maintenance    Refuses tetanus and Shingrix.  Has had x 1 pneumonia vaccine, PPSV23, second due at age 14.  Mammogram ordered.  Refuses Covid vaccinations, would greatly benefit from these.       Neutrophilic leukocytosis    Recheck CBC today.         Follow up plan: Return in about 4 weeks (around 08/14/2021) for COPD.

## 2021-07-17 NOTE — Assessment & Plan Note (Signed)
Noted on imaging 12/30/2020 -- will recommend statin therapy and further discuss next visit for prevention.

## 2021-07-17 NOTE — Assessment & Plan Note (Signed)
Refuses tetanus and Shingrix.  Has had x 1 pneumonia vaccine, PPSV23, second due at age 60.  Mammogram ordered.  Refuses Covid vaccinations, would greatly benefit from these.

## 2021-07-18 ENCOUNTER — Other Ambulatory Visit: Payer: Self-pay

## 2021-07-18 ENCOUNTER — Telehealth: Payer: Self-pay | Admitting: *Deleted

## 2021-07-18 ENCOUNTER — Ambulatory Visit (INDEPENDENT_AMBULATORY_CARE_PROVIDER_SITE_OTHER): Payer: BC Managed Care – PPO | Admitting: Internal Medicine

## 2021-07-18 ENCOUNTER — Encounter: Payer: Self-pay | Admitting: Internal Medicine

## 2021-07-18 DIAGNOSIS — J449 Chronic obstructive pulmonary disease, unspecified: Secondary | ICD-10-CM | POA: Diagnosis not present

## 2021-07-18 DIAGNOSIS — K219 Gastro-esophageal reflux disease without esophagitis: Secondary | ICD-10-CM

## 2021-07-18 LAB — CBC WITH DIFFERENTIAL/PLATELET
Basophils Absolute: 0 10*3/uL (ref 0.0–0.2)
Basos: 0 %
EOS (ABSOLUTE): 0.1 10*3/uL (ref 0.0–0.4)
Eos: 1 %
Hematocrit: 43.8 % (ref 34.0–46.6)
Hemoglobin: 14.7 g/dL (ref 11.1–15.9)
Immature Grans (Abs): 0.1 10*3/uL (ref 0.0–0.1)
Immature Granulocytes: 1 %
Lymphocytes Absolute: 0.7 10*3/uL (ref 0.7–3.1)
Lymphs: 5 %
MCH: 31 pg (ref 26.6–33.0)
MCHC: 33.6 g/dL (ref 31.5–35.7)
MCV: 92 fL (ref 79–97)
Monocytes Absolute: 0.5 10*3/uL (ref 0.1–0.9)
Monocytes: 4 %
Neutrophils Absolute: 12.2 10*3/uL — ABNORMAL HIGH (ref 1.4–7.0)
Neutrophils: 89 %
Platelets: 293 10*3/uL (ref 150–450)
RBC: 4.74 x10E6/uL (ref 3.77–5.28)
RDW: 12.7 % (ref 11.7–15.4)
WBC: 13.6 10*3/uL — ABNORMAL HIGH (ref 3.4–10.8)

## 2021-07-18 LAB — COMPREHENSIVE METABOLIC PANEL
ALT: 46 IU/L — ABNORMAL HIGH (ref 0–32)
AST: 20 IU/L (ref 0–40)
Albumin/Globulin Ratio: 2 (ref 1.2–2.2)
Albumin: 4.3 g/dL (ref 3.8–4.9)
Alkaline Phosphatase: 44 IU/L (ref 44–121)
BUN/Creatinine Ratio: 15 (ref 12–28)
BUN: 16 mg/dL (ref 8–27)
Bilirubin Total: 0.3 mg/dL (ref 0.0–1.2)
CO2: 24 mmol/L (ref 20–29)
Calcium: 9.1 mg/dL (ref 8.7–10.3)
Chloride: 102 mmol/L (ref 96–106)
Creatinine, Ser: 1.1 mg/dL — ABNORMAL HIGH (ref 0.57–1.00)
Globulin, Total: 2.1 g/dL (ref 1.5–4.5)
Glucose: 98 mg/dL (ref 65–99)
Potassium: 5 mmol/L (ref 3.5–5.2)
Sodium: 140 mmol/L (ref 134–144)
Total Protein: 6.4 g/dL (ref 6.0–8.5)
eGFR: 58 mL/min/{1.73_m2} — ABNORMAL LOW (ref >59)

## 2021-07-18 MED ORDER — ACETAMINOPHEN-CODEINE #3 300-30 MG PO TABS: 1.0000 | ORAL_TABLET | ORAL | 0 refills | Status: DC | PRN

## 2021-07-18 MED ORDER — ACETAMINOPHEN-CODEINE #3 300-30 MG PO TABS: 1.0000 | ORAL_TABLET | ORAL | 0 refills | Status: AC | PRN

## 2021-07-18 MED ORDER — PREDNISONE 10 MG PO TABS: ORAL_TABLET | ORAL | 0 refills | Status: DC

## 2021-07-18 MED ORDER — OMEPRAZOLE 40 MG PO CPDR: DELAYED_RELEASE_CAPSULE | Freq: Every evening | ORAL | Status: AC

## 2021-07-18 MED ORDER — FAMOTIDINE 20 MG PO TABS: ORAL_TABLET | ORAL | 11 refills | Status: DC

## 2021-07-18 NOTE — Assessment & Plan Note (Signed)
Spirometry 06/18/2021  FEV1 1.2 (43%)  Ratio 0.71 with atypical f/v with minimal  concavity in effort indep portion (so this is not copd)  - cyclical cough protocol 07/18/2021 >>>   Her exam is nl x for hoarseness and severe upper airway patter dry coughing fits c/w Upper airway cough syndrome (previously labeled PNDS),  is so named because it's frequently impossible to sort out how much is  CR/sinusitis with freq throat clearing (which can be related to primary GERD)   vs  causing  secondary (" extra esophageal")  GERD from wide swings in gastric pressure that occur with throat clearing, often  promoting self use of mint and menthol lozenges that reduce the lower esophageal sphincter tone and exacerbate the problem further in a cyclical fashion.   These are the same pts (now being labeled as having "irritable larynx syndrome" by some cough centers) who not infrequently have a history of having failed to tolerate ace inhibitors,  dry powder inhalers or biphosphonates or report having atypical/extraesophageal reflux symptoms that don't respond to standard doses of PPI  and are easily confused as having aecopd or asthma flares by even experienced allergists/ pulmonologists (myself included).    Of the three most common causes of  Sub-acute / recurrent or chronic cough, only one (GERD)  can actually contribute to/ trigger  the other two (asthma and post nasal drip syndrome)  and perpetuate the cylce of cough.  While not intuitively obvious, many patients with chronic low grade reflux do not cough until there is a primary insult that disturbs the protective epithelial barrier and exposes sensitive nerve endings.   This is typically viral but can due to PNDS and  either may apply here.   The point is that once this occurs, it is difficult to eliminate the cycle  using anything but a maximally effective acid suppression regimen at least in the short run, accompanied by an appropriate diet to address non acid GERD  and control / eliminate the cough itself for at least 3 days with tylenol #3 and flutter valve and >>> also so added 6 day taper off  Prednisone starting at 40 mg per day in case of component of Th-2 driven upper or lower airways inflammation (if cough responds short term only to relapse before return while will on full rx for uacs (as above), then  that would point to allergic rhinitis/ asthma or eos bronchitis as alternative dx)    >>> f/u in 2 weeks  with all meds in hand using a trust but verify approach to confirm accurate Medication  Reconciliation The principal here is that until we are certain that the  patients are doing what we've asked, it makes no sense to ask them to do more.          Each maintenance medication was reviewed in detail including emphasizing most importantly the difference between maintenance and prns and under what circumstances the prns are to be triggered using an action plan format where appropriate.  Total time for H and P, chart review, counseling, reviewing neb/flutter device(s) and generating customized AVS unique to this office visit / same day charting  > 60 min

## 2021-07-18 NOTE — Progress Notes (Signed)
Gerarda FractionBonnie D Oriordan, female    DOB: 08-May-1961,    MRN: [id number]   Brief patient profile:  60 yowf quit smoking 2006 with no real chronic or recurrent resp problems until around 2008 and 2016 (hurt ribs from coughing) with dx of asthma s/p admit 11/14/18  referred to pulmonary clinic 07/18/2021 for COPD eval s/p admit:   Admit date: 07/03/2021 Discharge date: 07/12/2021  Discharge Diagnoses:    COPD exacerbation (HCC)    Depression   Insomnia   Stage 2 moderate COPD by GOLD classification (HCC)   Hypothyroidism   Mixed hyperlipidemia   Acid reflux     Filed Weights    07/03/21 2137  Weight: 87.4 kg      History of present illness:  Gerarda FractionBonnie D Rakestraw is a 60 y.o. female with PMH significant for COPD, mixed hyperlipidemia, depression, anxiety, hypothyroid, GERD, neuropathy. Patient presented to the ED on 7/13 with complaint of shortness of breath, ongoing for the last month, associated with cough, dark thick sputum, not responding to outpatient treatment by PCP with prednisone, inhal[facility]   Hospital Course:    Acute exacerbation of COPD Acute respiratory failure with hypoxia Coughing spells -Presented with a month of shortness of breath, productive cough   -Treated for severe COPD exacerbation, using IV steroids, duo nebs, azithromycin -Clinically improving but was bothered by cough, improved significantly on regimen of steroids, Tessalon Perles, Tussionex, Claritin, Flonase  -Weaned off O2, still continues to complain of intermittent cough, clinically felt to be stable for discharge home, sent referral to Pulmonary   UTI -Complained of dysuria, urinalysis showed moderate leukocytes.  Completed 3-day course of IV Rocephin.   Chronic  diastolic CHF -Echocardiogram 7/14 showed EF of 50 to 55% no regional wall motion abnormality, grade 1 diastolic dysfunction, normal right ventricular size and systolic function and nl LA, RA    GERD -PPI    History of Present  Illness  07/18/2021  [facility]  Chief Complaint  Patient presents with   Consult    COPD- [facility] F/U couhging, wheezing, sob. Lost voice at her last admission 07/03/2021   Dyspnea:  best days can barely make it to back of [employer] where she works  Cough: all the time minimal production mucoid/ not aware of nasal drainage / no better with tessalon Sleep: wakes up / flat bed  SABA use: not helping  No obvious patterns in day to day or daytime variability or assoc excess/ purulent sputum or mucus plugs or hemoptysis or cp or chest tightness, subjective wheeze or overt sinus or hb symptoms.    Also denies any obvious fluctuation of symptoms with weather or environmental changes or other aggravating or alleviating factors except as outlined above   No unusual exposure hx or h/o childhood pna/ asthma or knowledge of premature birth.  Current Allergies, Complete Past Medical History, Past Surgical History, Family History, and Social History were reviewed in [facility] electronic medical record.  ROS  The following are not active complaints unless bolded Hoarseness, sore throat, dysphagia= globus dental problems, itching, sneezing,  nasal congestion or discharge of excess mucus or purulent secretions, ear ache,   fever, chills, sweats, unintended wt loss or wt gain, classically pleuritic or exertional cp,  orthopnea pnd or arm/hand swelling  or leg swelling, presyncope, palpitations, abdominal pain, anorexia, nausea, vomiting, diarrhea  or change in bowel habits or change in bladder habits, change in stools or change in urine,  dysuria, hematuria,  rash, arthralgias, visual complaints, headache, numbness, weakness or ataxia or problems with walking or coordination,  change in mood or  memory.           Past Medical History:  Diagnosis Date   Asthma    COPD (chronic obstructive pulmonary disease) (HCC)    Depression    High cholesterol    Osteoporosis     Thyroid disease     Outpatient Medications Prior to Visit  Medication Sig Dispense Refill   benzonatate (TESSALON PERLES) 100 MG capsule Take 1 capsule (100 mg total) by mouth 3 (three) times daily as needed for cough. 60 capsule 1   Budeson-Glycopyrrol-Formoterol (BREZTRI AEROSPHERE) 160-9-4.8 MCG/ACT AERO Inhale 2 puffs into the lungs 2 (two) times daily. 10.7 g 11   buPROPion (WELLBUTRIN XL) 150 MG 24 hr tablet TAKE ONE TABLET BY MOUTH EVERY DAY 90 tablet 4   cetirizine (ZYRTEC) 10 MG tablet TAKE ONE TABLET BY MOUTH EVERY DAY 90 tablet 4   Cholecalciferol 1.25 MG (50000 UT) TABS Take 1 tablet by mouth once a week. 12 tablet 2   cyclobenzaprine (FLEXERIL) 10 MG tablet Take 1 tablet (10 mg total) by mouth at bedtime. 90 tablet 3   FLUoxetine (PROZAC) 20 MG tablet Take 1 tablet (20 mg total) by mouth daily. 90 tablet 4   fluticasone (FLONASE) 50 MCG/ACT nasal spray PLACE 2 SPRAYS INTO BOTH NOSTRILS EVERY DAY 16 g 1   gabapentin (NEURONTIN) 300 MG capsule  Not using   4   guaiFENesin (MUCINEX) 600 MG 12 hr tablet Take 1 tablet (600 mg total) by mouth 2 (two) times daily. 20 tablet 0   ipratropium-albuterol (DUONEB) 0.5-2.5 (3) MG/3ML SOLN Take 3 mLs by nebulization every 6 (six) hours as needed. 360 mL 2   levothyroxine (SYNTHROID) 125 MCG tablet Take 1 tablet (125 mcg total) by mouth daily. 90 tablet 4   montelukast (SINGULAIR) 10 MG tablet Take 1 tablet (10 mg total) by mouth at bedtime. 30 tablet 0   omeprazole (PRILOSEC) 40 MG capsule TAKE ONE CAPSULE BY MOUTH EVERY EVENING 90 capsule 4   rosuvastatin (CRESTOR) 20 MG tablet TAKE ONE TABLET BY MOUTH EVERY DAY 90 tablet 4   traZODone (DESYREL) 50 MG tablet TAKE ONE TABLET BY MOUTH AT BEDTIME AS NEEDED 30 tablet 2   vitamin B-12 (CYANOCOBALAMIN) 500 MCG tablet Take 1 tablet (500 mcg total) by mouth daily. 90 tablet 4   albuterol (VENTOLIN HFA) 108 (90 Base) MCG/ACT inhaler Inhale 2 puffs into the lungs every 6 (six) hours as needed for wheezing  or shortness of breath. (Patient not taking: Reported on 07/18/2021) 16 g 2   No facility-administered medications prior to visit.     Objective:     BP 128/78 (BP Location: Left Arm, Patient Position: Sitting, Cuff Size: Normal)   Pulse 88   Temp 98 F (36.7 C) (Oral)   Ht 5\' 7"  (1.702 m)   Wt 192 lb 3.2 oz (87.2 kg)   LMP  (LMP Unknown)   SpO2 97%   BMI 30.10 kg/m   SpO2: 97 %  Extremely hoarse amb wf nad   HEENT : pt wearing mask not removed for exam due to covid -19 concerns.    NECK :  without JVD/Nodes/TM/ nl carotid upstrokes bilaterally   LUNGS: no acc muscle use,  Nl contour chest which is clear to A and P bilaterally without cough on insp or exp maneuvers   CV:  RRR  no s3 or murmur or increase in P2, and no edema   ABD:  soft and nontender with nl inspiratory excursion in the supine position. No bruits or organomegaly appreciated, bowel sounds nl  MS:  Nl gait/ ext warm without deformities, calf tenderness, cyanosis or clubbing No obvious joint restrictions   SKIN: warm and dry without lesions    NEURO:  alert, approp, nl sensorium with  no motor or cerebellar deficits apparent.     I personally reviewed images and agree with radiology impression as follows:  CXR:   7/19 22  COPD changes.  No acute abnormalities.    Assessment   Asthmatic bronchitis , chronic (HCC) Spirometry 06/18/2021  FEV1 1.2 (43%)  Ratio 0.71 with atypical f/v with minimal  concavity in effort indep portion (so this is not copd)  - cyclical cough protocol 07/18/2021 >>>   Her exam is nl x for hoarseness and severe upper airway patter dry coughing fits c/w Upper airway cough syndrome (previously labeled PNDS),  is so named because it's frequently impossible to sort out how much is  CR/sinusitis with freq throat clearing (which can be related to primary GERD)   vs  causing  secondary (" extra esophageal")  GERD from wide swings in gastric pressure that occur with throat clearing,  often  promoting self use of mint and menthol lozenges that reduce the lower esophageal sphincter tone and exacerbate the problem further in a cyclical fashion.   These are the same pts (now being labeled as having "irritable larynx syndrome" by some cough centers) who not infrequently have a history of having failed to tolerate ace inhibitors,  dry powder inhalers or biphosphonates or report having atypical/extraesophageal reflux symptoms that don't respond to standard doses of PPI  and are easily confused as having aecopd or asthma flares by even experienced allergists/ pulmonologists (myself included).    Of the three most common causes of  Sub-acute / recurrent or chronic cough, only one (GERD)  can actually contribute to/ trigger  the other two (asthma and post nasal drip syndrome)  and perpetuate the cylce of cough.  While not intuitively obvious, many patients with chronic low grade reflux do not cough until there is a primary insult that disturbs the protective epithelial barrier and exposes sensitive nerve endings.   This is typically viral but can due to PNDS and  either may apply here.   The point is that once this occurs, it is difficult to eliminate the cycle  using anything but a maximally effective acid suppression regimen at least in the short run, accompanied by an appropriate diet to address non acid GERD and control / eliminate the cough itself for at least 3 days with tylenol #3 and flutter valve and >>> also so added 6 day taper off  Prednisone starting at 40 mg per day in case of component of Th-2 driven upper or lower airways inflammation (if cough responds short term only to relapse before return while will on full rx for uacs (as above), then  that would point to allergic rhinitis/ asthma or eos bronchitis as alternative dx)    >>> f/u in 2 weeks  with all meds in hand using a trust but verify approach to confirm accurate Medication  Reconciliation The principal here is that until  we are certain that the  patients are doing what we've asked, it makes no sense to ask them to do more.          Each  maintenance medication was reviewed in detail including emphasizing most importantly the difference between maintenance and prns and under what circumstances the prns are to be triggered using an action plan format where appropriate.  Total time for H and P, chart review, counseling, reviewing neb/flutter device(s) and generating customized AVS unique to this office visit / same day charting  > 60 min          Sandrea Hughs, MD 07/18/2021

## 2021-07-18 NOTE — Patient Instructions (Addendum)
Gabapentin 300 mg  build up to 4 x daily until return   Prednisone 10 mg take  4 each am x 2 days,   2 each am x 2 days,  1 each am x 2 days and stop   Stop all inhalers and just use duoneb up to every 4 hours if needed for breathing   For  cough >>  mucinex dm 1200 mg  twice daily and use the flutter valve as much as you can and if you can't stop coughing ok to take tylenol #3 every 4 hours    Try prilosec (omeprazole)  40mg   Take 30-60 min before first meal of the day and Pepcid ac (famotidine) 20 mg one after supper until return      GERD (REFLUX)  is an extremely common cause of respiratory symptoms just like yours , many times with no obvious heartburn at all.    It can be treated with medication, but also with lifestyle changes including elevation of the head of your bed (ideally with 6 -8inch blocks under the headboard of your bed),  Smoking cessation, avoidance of late meals, excessive alcohol, and avoid fatty foods, chocolate, peppermint, colas, red wine, and acidic juices such as orange juice.  NO MINT OR MENTHOL PRODUCTS SO NO COUGH DROPS or chochate USE SUGARLESS CANDY INSTEAD (Jolley ranchers or Stover's or Life Savers) or even ice chips will also do - the key is to swallow to prevent all throat clearing. NO OIL BASED VITAMINS - use powdered substitutes.  Avoid fish oil when coughing.   Please schedule a follow up office visit in 2 weeks, sooner if needed  with all medications /inhalers/ solutions in hand so we can verify exactly what you are taking. This includes all medications from all doctors and over the counters

## 2021-07-18 NOTE — Telephone Encounter (Signed)
Called Pt. To let her know she need to come in and sign work certification form. Pt confirmed she will come and sign today or tomorrow.

## 2021-07-18 NOTE — Progress Notes (Signed)
Contacted via [place] afternoon Clella, your labs have returned.   - CBC does show mild elevation in white blood cell count and neutrophils = possibly related to recent infection or recent Prednisone.  Will recheck next visit. - Kidney function, creatinine and eGFR, shows some mild kidney disease which we will continue to monitor closely.  Liver function, AST and ALT, show very mild elevation in ALT -- try to avoid any Tylenol or alcohol use.  Any questions?  How did pulmonary go? Keep being awesome!!  Thank you for allowing me to participate in your care.  I appreciate you. Kindest regards, Annarose Ouellet

## 2021-07-24 ENCOUNTER — Telehealth: Payer: Self-pay

## 2021-07-24 NOTE — Telephone Encounter (Signed)
Social worker left message for patient following up with her on her health insurance and making sure all is well. Also ready to provide her with a phone number to find providers matching her insurance.

## 2021-07-30 ENCOUNTER — Telehealth: Payer: Self-pay

## 2021-07-30 NOTE — Telephone Encounter (Signed)
Spoke with Loletta Parish on behalf of patient and notified them that patient will be on restrictions for duration of 8 weeks per Jolene. [employer] representative verbalized understanding and has no further questions.

## 2021-07-30 NOTE — Telephone Encounter (Signed)
Copied from CRM [id number]. Topic: General - Other >> Jul 30, 2021 11:52 AM Glean Salen wrote: Reason for [id number] from [employer] called in about disability restrictions for patient. He needs to know that those restrictions are. Please call back

## 2021-07-31 NOTE — Telephone Encounter (Signed)
Social worker called patient to check in on her and see if she was in need of a mental health provider and to check on her insurance status. Patient shared that she has a medical doctor and is not interested in a mental health provider at this time. Social worker informed her that if she becomes interested, she can call her insurance and get referrals for one.

## 2021-08-06 ENCOUNTER — Encounter: Payer: Self-pay | Admitting: Internal Medicine

## 2021-08-06 ENCOUNTER — Other Ambulatory Visit: Payer: Self-pay

## 2021-08-06 ENCOUNTER — Ambulatory Visit: Payer: BC Managed Care – PPO | Admitting: Nurse Practitioner

## 2021-08-06 ENCOUNTER — Ambulatory Visit (INDEPENDENT_AMBULATORY_CARE_PROVIDER_SITE_OTHER): Payer: BC Managed Care – PPO | Admitting: Internal Medicine

## 2021-08-06 ENCOUNTER — Telehealth: Payer: Self-pay

## 2021-08-06 DIAGNOSIS — R058 Other specified cough: Secondary | ICD-10-CM

## 2021-08-06 DIAGNOSIS — J449 Chronic obstructive pulmonary disease, unspecified: Secondary | ICD-10-CM

## 2021-08-06 NOTE — Assessment & Plan Note (Signed)
Quit smoking 2006 Spirometry 06/18/2021  FEV1 1.2 (43%)  Ratio 0.71 with atypical f/v with minimal  concavity in effort indep portion  - cyclical cough protocol 07/18/2021 >>> some better 08/06/2021 but still not able to use voice    Main problem at this point is upper airway in nature and likely to be aggravated by any medication x for duoneb so rec duoneb up to qid prn and f/u ENT asap (see uacs)

## 2021-08-06 NOTE — Assessment & Plan Note (Signed)
Onset was 10/2018  - referred to ENT 08/06/2021 >>>   Upper airway cough syndrome (previously labeled PNDS),  is so named because it's frequently impossible to sort out how much is  CR/sinusitis with freq throat clearing (which can be related to primary GERD)   vs  causing  secondary (" extra esophageal")  GERD from wide swings in gastric pressure that occur with throat clearing, often  promoting self use of mint and menthol lozenges that reduce the lower esophageal sphincter tone and exacerbate the problem further in a cyclical fashion.   These are the same pts (now being labeled as having "irritable larynx syndrome" by some cough centers) who not infrequently have a history of having failed to tolerate ace inhibitors,  dry powder inhalers or biphosphonates or report having atypical/extraesophageal reflux symptoms that don't respond to standard doses of PPI  and are easily confused as having aecopd or asthma flares by even experienced allergists/ pulmonologists (myself included).    She still has incessant throat clearing/ grinding and not on max doses of gabapentin so rec increase to 300 mg qid and continue max rx for gerd pending eval at voice center at [facility].  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          Each maintenance medication was reviewed in detail including emphasizing most importantly the difference between maintenance and prns and under what circumstances the prns are to be triggered using an action plan format where appropriate.  Total time for H and P, chart review, counseling, reviewing neb  device(s) and generating customized AVS unique to this office visit / same day charting  >  30 min

## 2021-08-06 NOTE — Progress Notes (Signed)
Audrey Richard, female    DOB: [birth year],    MRN: [id number]   Brief patient profile:  37 yowf quit smoking 2006 with no real chronic or recurrent resp problems until around 2008 and 2016 (hurt ribs from coughing) with dx of asthma s/p admit 11/14/18  referred to pulmonary clinic 07/18/2021 for COPD eval s/p admit:   Admit date: 07/03/2021 Discharge date: 07/12/2021  Discharge Diagnoses:    COPD exacerbation ([place])    Depression   Insomnia   Stage 2 moderate COPD by GOLD classification ([place])   Hypothyroidism   Mixed hyperlipidemia   Acid reflux     Filed Weights    07/03/21 2137  Weight: 87.4 kg      History of present illness:  Audrey Richard is a 60 y.o. female with PMH significant for COPD, mixed hyperlipidemia, depression, anxiety, hypothyroid, GERD, neuropathy. Patient presented to the ED on 7/13 with complaint of shortness of breath, ongoing for the last month, associated with cough, dark thick sputum, not responding to outpatient treatment by PCP with prednisone, inhaler, [facility] Course:    Acute exacerbation of COPD Acute respiratory failure with hypoxia Coughing spells -Presented with a month of shortness of breath, productive cough   -Treated for severe COPD exacerbation, using IV steroids, duo nebs, azithromycin -Clinically improving but was bothered by cough, improved significantly on regimen of steroids, Tessalon Perles, Tussionex, Claritin, Flonase  -Weaned off O2, still continues to complain of intermittent cough, clinically felt to be stable for discharge home, sent referral to Pulmonary   UTI -Complained of dysuria, urinalysis showed moderate leukocytes.  Completed 3-day course of IV Rocephin.   Chronic  diastolic CHF -Echocardiogram 7/14 showed EF of 50 to 55% no regional wall motion abnormality, grade 1 diastolic dysfunction, normal right ventricular size and systolic function and nl LA, RA    GERD -PPI    History of Present  Illness  07/18/2021  Pulmonary/ 1st office eval/ [name] / [facility]  Chief Complaint  Patient presents with   Consult    COPD- [facility] F/U couhging, wheezing, sob. Lost voice at her last admission 07/03/2021  Dyspnea:  best days can barely make it to back of [employer] where she works since 2019  Cough: all the time minimal production mucoid/ not aware of nasal drainage / no better with tessalon Sleep: wakes up / flat bed  SABA use: not helping Rec Gabapentin 300 mg  build up to 4 x daily until return  Prednisone 10 mg take  4 each am x 2 days,   2 each am x 2 days,  1 each am x 2 days and stop  Stop all inhalers and just use duoneb up to every 4 hours if needed for breathing  For  cough >>  mucinex dm 1200 mg  twice daily and use the flutter valve as much as you can and if you can't stop coughing ok to take tylenol #3 every 4 hours  Try prilosec (omeprazole)  35m  Take 30-60 min before first meal of the day and Pepcid ac (famotidine) 20 mg one after supper until return  GERD diet reviewed, bed blocks rec  Please schedule a follow up office visit in 2 weeks, sooner if needed  with all medications /inhalers/ solutions in hand so we can verify exactly what you are taking. This includes all medications from all doctors and over the counters     08[facility]06/2021  [name]  [facility] did not bring meds / confused with details of care, names of meds Chief Complaint  Patient presents with   Follow-up  On prednisone reduced the duoneb twice daily and no more since d/c pred Dyspnea:  MMRC2 = can't walk a nl pace on a flat grade s sob but does fine slow and flat  Cough: minimal yellowish in am Sleeping: p waking to pee notes cough SABA use: just duoneb  02: none  Covid status:   none / had omicron    No obvious day to day or daytime variability or assoc excess/ purulent sputum or mucus plugs or hemoptysis or cp or chest tightness, subjective wheeze or overt sinus or hb symptoms.      Also denies any obvious fluctuation of symptoms with weather or environmental changes or other aggravating or alleviating factors except as outlined above   No unusual exposure hx or h/o childhood pna/ asthma or knowledge of premature birth.  Current Allergies, Complete Past Medical History, Past Surgical History, Family History, and Social History were reviewed in [employer] record.  ROS  The following are not active complaints unless bolded Hoarseness, sore throat, dysphagia, dental problems, itching, sneezing,  nasal congestion or discharge of excess mucus or purulent secretions, ear ache,   fever, chills, sweats, unintended wt loss or wt gain, classically pleuritic or exertional cp,  orthopnea pnd or arm/hand swelling  or leg swelling, presyncope, palpitations, abdominal pain, anorexia, nausea, vomiting, diarrhea  or change in bowel habits or change in bladder habits, change in stools or change in urine, dysuria, hematuria,  rash, arthralgias, visual complaints, headache, numbness, weakness or ataxia or problems with walking or coordination,  change in mood or  memory.        Current Meds  Medication Sig   buPROPion (WELLBUTRIN XL) 150 MG 24 hr tablet TAKE ONE TABLET BY MOUTH EVERY DAY   cetirizine (ZYRTEC) 10 MG tablet TAKE ONE TABLET BY MOUTH EVERY DAY   Cholecalciferol 1.25 MG (50000 UT) TABS Take 1 tablet by mouth once a week.   cyclobenzaprine (FLEXERIL) 10 MG tablet Take 1 tablet (10 mg total) by mouth at bedtime.   famotidine (PEPCID) 20 MG tablet One after supper   FLUoxetine (PROZAC) 20 MG tablet Take 1 tablet (20 mg total) by mouth daily.   fluticasone (FLONASE) 50 MCG/ACT nasal spray PLACE 2 SPRAYS INTO BOTH NOSTRILS EVERY DAY   gabapentin (NEURONTIN) 300 MG capsule TAKE ONE CAPSULE BY MOUTH 3 TIMES A DAY   ipratropium-albuterol (DUONEB) 0.5-2.5 (3) MG/3ML SOLN Take 3 mLs by nebulization every 6 (six) hours as needed.   levothyroxine (SYNTHROID) 125 MCG  tablet Take 1 tablet (125 mcg total) by mouth daily.   montelukast (SINGULAIR) 10 MG tablet Take 1 tablet (10 mg total) by mouth at bedtime.   omeprazole (PRILOSEC) 40 MG capsule TAKE ONE CAPSULE BY MOUTH EVERY EVENING   predniSONE (DELTASONE) 10 MG tablet Take  4 each am x 2 days,   2 each am x 2 days,  1 each am x 2 days and stop   rosuvastatin (CRESTOR) 20 MG tablet TAKE ONE TABLET BY MOUTH EVERY DAY   traZODone (DESYREL) 50 MG tablet TAKE ONE TABLET BY MOUTH AT BEDTIME AS NEEDED   vitamin B-12 (CYANOCOBALAMIN) 500 MCG tablet Take 1 tablet (500 mcg total) by mouth daily.             Past Medical History:  Diagnosis Date   Asthma  COPD (chronic obstructive pulmonary disease) (HCC)    Depression    High cholesterol    Osteoporosis    Thyroid disease         Objective:     Wt Readings from Last 3 Encounters:  08/06/21 197 lb (89.4 kg)  07/18/21 192 lb 3.2 oz (87.2 kg)  07/17/21 192 lb 9.6 oz (87.4 kg)      Vital signs reviewed  08/06/2021  - Note at rest 02 sats  97% on RA   General appearance:    very hoarse amb mod obese wf with mild pseudowheeze/ freq throat clearing   HEENT : pt wearing mask not removed for exam due to covid -19 concerns.    NECK :  without JVD/Nodes/TM/ nl carotid upstrokes bilaterally   LUNGS: no acc muscle use,  Nl contour chest which is clear to A and P bilaterally without cough on insp or exp maneuvers - p no bronchodilators today    CV:  RRR  no s3 or murmur or increase in P2, and no edema   ABD: mod obese soft and nontender with nl inspiratory excursion in the supine position. No bruits or organomegaly appreciated, bowel sounds nl  MS:  Nl gait/ ext warm without deformities, calf tenderness, cyanosis or clubbing No obvious joint restrictions   SKIN: warm and dry without lesions    NEURO:  alert, approp, nl sensorium with  no motor or cerebellar deficits apparent.         Assessment

## 2021-08-06 NOTE — Telephone Encounter (Signed)
Form for work, filled out by Dr Sherene Sires. Placed in to be mailed file.

## 2021-08-06 NOTE — Patient Instructions (Signed)
See your prior instructions  - they are the same though we won't be refilling your prednisone and tylenol #3  Increase gabapentin 300 mg to four times daily    We will arrange for you to see a throat/voice specialist asap at [facility]    Pulmonary f/u is here or [facility] but  must be with all medications /inhalers/ solutions in hand so we can verify exactly what you are taking. This includes all medications from all doctors and over the counters

## 2021-08-14 ENCOUNTER — Ambulatory Visit: Payer: BC Managed Care – PPO | Admitting: Nurse Practitioner

## 2021-08-23 ENCOUNTER — Other Ambulatory Visit: Payer: Self-pay

## 2021-08-23 ENCOUNTER — Ambulatory Visit (INDEPENDENT_AMBULATORY_CARE_PROVIDER_SITE_OTHER): Payer: BC Managed Care – PPO | Admitting: Nurse Practitioner

## 2021-08-23 ENCOUNTER — Encounter: Payer: Self-pay | Admitting: Nurse Practitioner

## 2021-08-23 VITALS — BP 104/72 | HR 84 | Temp 98.4°F | Wt 195.6 lb

## 2021-08-23 DIAGNOSIS — J449 Chronic obstructive pulmonary disease, unspecified: Secondary | ICD-10-CM

## 2021-08-23 DIAGNOSIS — K219 Gastro-esophageal reflux disease without esophagitis: Secondary | ICD-10-CM

## 2021-08-23 DIAGNOSIS — N1831 Chronic kidney disease, stage 3a: Secondary | ICD-10-CM

## 2021-08-23 DIAGNOSIS — R058 Other specified cough: Secondary | ICD-10-CM

## 2021-08-23 DIAGNOSIS — E039 Hypothyroidism, unspecified: Secondary | ICD-10-CM

## 2021-08-23 NOTE — Assessment & Plan Note (Addendum)
Ongoing, will continue collaboration with ENT and pulmonary.  Appreciate their input, recent labs and notes reviewed.  Recheck CBC today, suspect elevations in WBC related to Prednisone.

## 2021-08-23 NOTE — Assessment & Plan Note (Signed)
Chronic, ongoing, using Pepcid.  Continue current medication regimen and adjust as needed.  Mag level next visit.

## 2021-08-23 NOTE — Patient Instructions (Signed)
Asthma, Adult  Asthma is a long-term (chronic) condition in which the airways get tight and narrow. The airways are the breathing passages that lead from the nose and mouth down into the lungs. A person with asthma will have times when symptoms get worse. These are called asthma attacks. They can cause coughing, whistling sounds when you breathe (wheezing), shortness of breath, and chest pain. They can make it hard to breathe. Thereis no cure for asthma, but medicines and lifestyle changes can help control it. There are many things that can bring on an asthma attack or make asthma symptoms worse (triggers). Common triggers include: Mold. Dust. Cigarette smoke. Cockroaches. Things that can cause allergy symptoms (allergens). These include animal skin flakes (dander) and pollen from trees or grass. Things that pollute the air. These may include household cleaners, wood smoke, smog, or chemical odors. Cold air, weather changes, and wind. Crying or laughing hard. Stress. Certain medicines or drugs. Certain foods such as dried fruit, potato chips, and grape juice. Infections, such as a cold or the flu. Certain medical conditions or diseases. Exercise or tiring activities. Asthma may be treated with medicines and by staying away from the things that cause asthma attacks. Types of medicines may include: Controller medicines. These help prevent asthma symptoms. They are usually taken every day. Fast-acting reliever or rescue medicines. These quickly relieve asthma symptoms. They are used as needed and provide short-term relief. Allergy medicines if your attacks are brought on by allergens. Medicines to help control the body's defense (immune) system. Follow these instructions at home: Avoiding triggers in your home Change your heating and air conditioning filter often. Limit your use of fireplaces and wood stoves. Get rid of pests (such as roaches and mice) and their droppings. Throw away plants  if you see mold on them. Clean your floors. Dust regularly. Use cleaning products that do not smell. Have someone vacuum when you are not home. Use a vacuum cleaner with a HEPA filter if possible. Replace carpet with wood, tile, or vinyl flooring. Carpet can trap animal skin flakes and dust. Use allergy-proof pillows, mattress covers, and box spring covers. Wash bed sheets and blankets every week in hot water. Dry them in a dryer. Keep your bedroom free of any triggers. Avoid pets and keep windows closed when things that cause allergy symptoms are in the air. Use blankets that are made of polyester or cotton. Clean bathrooms and kitchens with bleach. If possible, have someone repaint the walls in these rooms with mold-resistant paint. Keep out of the rooms that are being cleaned and painted. Wash your hands often with soap and water. If soap and water are not available, use hand sanitizer. Do not allow anyone to smoke in your home. General instructions Take over-the-counter and prescription medicines only as told by your doctor. Talk with your doctor if you have questions about how or when to take your medicines. Make note if you need to use your medicines more often than usual. Do not use any products that contain nicotine or tobacco, such as cigarettes and e-cigarettes. If you need help quitting, ask your doctor. Stay away from secondhand smoke. Avoid doing things outdoors when allergen counts are high and when air quality is low. Wear a ski mask when doing outdoor activities in the winter. The mask should cover your nose and mouth. Exercise indoors on cold days if you can. Warm up before you exercise. Take time to cool down after exercise. Use a peak flow meter as   told by your doctor. A peak flow meter is a tool that measures how well the lungs are working. Keep track of the peak flow meter's readings. Write them down. Follow your asthma action plan. This is a written plan for taking care  of your asthma and treating your attacks. Make sure you get all the shots (vaccines) that your doctor recommends. Ask your doctor about a flu shot and a pneumonia shot. Keep all follow-up visits as told by your doctor. This is important. Contact a doctor if: You have wheezing, shortness of breath, or a cough even while taking medicine to prevent attacks. The mucus you cough up (sputum) is thicker than usual. The mucus you cough up changes from clear or white to yellow, green, gray, or bloody. You have problems from the medicine you are taking, such as: A rash. Itching. Swelling. Trouble breathing. You need reliever medicines more than 2-3 times a week. Your peak flow reading is still at 50-79% of your personal best after following the action plan for 1 hour. You have a fever. Get help right away if: You seem to be worse and are not responding to medicine during an asthma attack. You are short of breath even at rest. You get short of breath when doing very little activity. You have trouble eating, drinking, or talking. You have chest pain or tightness. You have a fast heartbeat. Your lips or fingernails start to turn blue. You are light-headed or dizzy, or you faint. Your peak flow is less than 50% of your personal best. You feel too tired to breathe normally. Summary Asthma is a long-term (chronic) condition in which the airways get tight and narrow. An asthma attack can make it hard to breathe. Asthma cannot be cured, but medicines and lifestyle changes can help control it. Make sure you understand how to avoid triggers and how and when to use your medicines. This information is not intended to replace advice given to you by your health care provider. Make sure you discuss any questions you have with your healthcare provider. Document Revised: 04/11/2020 Document Reviewed: 04/11/2020 Elsevier Patient Education  2022 Elsevier Inc.  

## 2021-08-23 NOTE — Assessment & Plan Note (Signed)
Noted on recent labs initially November 2021, recheck BMP today.  Urine ALB next visit, could consider addition of low dose ACE or ARB in future.  If worsening consider referral to nephrology.

## 2021-08-23 NOTE — Progress Notes (Signed)
BP 104/72   Pulse 84   Temp 98.4 F (36.9 C) (Oral)   Wt 195 lb 9.6 oz (88.7 kg)   LMP  (LMP Unknown)   SpO2 96%   BMI 30.18 kg/m    Subjective:    Patient ID: Audrey Richard, female    DOB: 1961/05/19, 60 y.o.   MRN: [id number]  HPI: Audrey Richard is a 60 y.o. female  Chief Complaint  Patient presents with   COPD    Patient states she is feeling a little better. Patient states she has seen a lung specialist and had an endoscopy. Patient states she was diagnosed with strep throat and ulcers in her throat. Patient states she still has the coughing. Patient she was prescribed Bactrim.    Hypothyroidism    Patient is here for Thyroid labs.    ASTHMA Currently is following with Dr. Melvyn Novas with pulmonary -- noted reviewed -- this is suspected to be more asthmatic bronchitis and chronic airway cough syndrome.  Recent visit 08/06/21 and she was referred to ENT who she had initial visit with, Dr. Joya Gaskins, on 08/19/21 -- prescribed Bactrim and Prednisone.  She is currently taking Gabapentin 300 MG QID and Pepcid for treatment.  Has Duonebs as needed, inhalers were stopped.  She is scheduled to return to ENT 09/10/21 and will be starting speech therapy as well that day.    Recent labs continued to note mild elevation in WBC, but had been taking Prednisone prior to check.  Also noted ongoing mild kidney disease. Asthma status: stable Satisfied with current treatment?: yes Albuterol/rescue inhaler frequency: none Dyspnea frequency: only in heat Wheezing frequency: from throat area Cough frequency: occasional Nocturnal symptom frequency: occasional cough Limitation of activity:  at times Current upper respiratory symptoms: no Triggers: heat and seasonal Last Spirometry: in August with pulmonary Failed/intolerant to following asthma meds: none Asthma meds in past: Breztri, Albuterol Aerochamber/spacer use: no Visits to ER or Urgent Care in past year: no Pneumovax: Up to Date Influenza:  Up to Date   HYPOTHYROIDISM Recently changed her Levothyroxine to 125 MCG and stopped 137 MCG due to low TSH on labs.  Thyroid control status:stable Satisfied with current treatment? yes Medication side effects: no Medication compliance: good compliance Etiology of hypothyroidism:  Recent dose adjustment: yes Fatigue: no Cold intolerance: no Heat intolerance: no Weight gain: no Weight loss: no Constipation: yes Diarrhea/loose stools: no Palpitations: no Lower extremity edema: no Anxiety/depressed mood: no   Relevant past medical, surgical, family and social history reviewed and updated as indicated. Interim medical history since our last visit reviewed. Allergies and medications reviewed and updated.  Review of Systems  Constitutional:  Negative for activity change, appetite change, diaphoresis, fatigue and fever.  Respiratory:  Positive for cough. Negative for chest tightness, shortness of breath and wheezing.   Cardiovascular:  Negative for chest pain, palpitations and leg swelling.  Gastrointestinal: Negative.   Endocrine: Negative for cold intolerance and heat intolerance.  Neurological: Negative.   Psychiatric/Behavioral: Negative.     Per HPI unless specifically indicated above     Objective:    BP 104/72   Pulse 84   Temp 98.4 F (36.9 C) (Oral)   Wt 195 lb 9.6 oz (88.7 kg)   LMP  (LMP Unknown)   SpO2 96%   BMI 30.18 kg/m   Wt Readings from Last 3 Encounters:  08/23/21 195 lb 9.6 oz (88.7 kg)  08/06/21 197 lb (89.4 kg)  07/18/21 192 lb 3.2 oz (  87.2 kg)    Physical Exam Vitals and nursing note reviewed.  Constitutional:      General: She is awake. She is not in acute distress.    Appearance: She is well-developed and well-groomed. She is obese. She is not ill-appearing or toxic-appearing.  HENT:     Head: Normocephalic.     Comments: Mild hoarseness, improved from past visit.    Right Ear: Hearing normal.     Left Ear: Hearing normal.  Eyes:      General: Lids are normal.        Right eye: No discharge.        Left eye: No discharge.     Conjunctiva/sclera: Conjunctivae normal.     Pupils: Pupils are equal, round, and reactive to light.  Neck:     Thyroid: No thyromegaly.     Vascular: No carotid bruit.  Cardiovascular:     Rate and Rhythm: Normal rate and regular rhythm.     Heart sounds: Normal heart sounds. No murmur heard.   No gallop.  Pulmonary:     Effort: Pulmonary effort is normal. No accessory muscle usage or respiratory distress.     Breath sounds: Normal breath sounds. No decreased breath sounds, wheezing or rhonchi.  Abdominal:     General: Bowel sounds are normal.     Palpations: Abdomen is soft.  Musculoskeletal:     Cervical back: Normal range of motion and neck supple.     Right lower leg: No edema.     Left lower leg: No edema.  Lymphadenopathy:     Cervical: No cervical adenopathy.  Skin:    General: Skin is warm and dry.  Neurological:     Mental Status: She is alert and oriented to person, place, and time.     Deep Tendon Reflexes: Reflexes are normal and symmetric.     Reflex Scores:      Brachioradialis reflexes are 2+ on the right side and 2+ on the left side.      Patellar reflexes are 2+ on the right side and 2+ on the left side. Psychiatric:        Attention and Perception: Attention normal.        Mood and Affect: Mood normal.        Speech: Speech normal.        Behavior: Behavior normal. Behavior is cooperative.        Thought Content: Thought content normal.    Results for orders placed or performed in visit on 07/17/21  CBC with Differential/Platelet  Result Value Ref Range   WBC 13.6 (H) 3.4 - 10.8 x10E3/uL   RBC 4.74 3.77 - 5.28 x10E6/uL   Hemoglobin 14.7 11.1 - 15.9 g/dL   Hematocrit 43.8 34.0 - 46.6 %   MCV 92 79 - 97 fL   MCH 31.0 26.6 - 33.0 pg   MCHC 33.6 31.5 - 35.7 g/dL   RDW 12.7 11.7 - 15.4 %   Platelets 293 150 - 450 x10E3/uL   Neutrophils 89 Not Estab. %    Lymphs 5 Not Estab. %   Monocytes 4 Not Estab. %   Eos 1 Not Estab. %   Basos 0 Not Estab. %   Neutrophils Absolute 12.2 (H) 1.4 - 7.0 x10E3/uL   Lymphocytes Absolute 0.7 0.7 - 3.1 x10E3/uL   Monocytes Absolute 0.5 0.1 - 0.9 x10E3/uL   EOS (ABSOLUTE) 0.1 0.0 - 0.4 x10E3/uL   Basophils Absolute 0.0 0.0 - 0.2 x10E3/uL  Immature Granulocytes 1 Not Estab. %   Immature Grans (Abs) 0.1 0.0 - 0.1 x10E3/uL  Comprehensive metabolic panel  Result Value Ref Range   Glucose 98 65 - 99 mg/dL   BUN 16 8 - 27 mg/dL   Creatinine, Ser 1.10 (H) 0.57 - 1.00 mg/dL   eGFR 58 (L) >59 mL/min/1.73   BUN/Creatinine Ratio 15 12 - 28   Sodium 140 134 - 144 mmol/L   Potassium 5.0 3.5 - 5.2 mmol/L   Chloride 102 96 - 106 mmol/L   CO2 24 20 - 29 mmol/L   Calcium 9.1 8.7 - 10.3 mg/dL   Total Protein 6.4 6.0 - 8.5 g/dL   Albumin 4.3 3.8 - 4.9 g/dL   Globulin, Total 2.1 1.5 - 4.5 g/dL   Albumin/Globulin Ratio 2.0 1.2 - 2.2   Bilirubin Total 0.3 0.0 - 1.2 mg/dL   Alkaline Phosphatase 44 44 - 121 IU/L   AST 20 0 - 40 IU/L   ALT 46 (H) 0 - 32 IU/L      Assessment & Plan:   Problem List Items Addressed This Visit       Respiratory   Asthmatic bronchitis , chronic (HCC) - Primary    Chronic, followed by pulmonary (Dr. Melvyn Novas), have greatly appreciated his input.  Will continue current medication regimen and collaboration with both pulmonary and ENT, some improvement in patient symptoms noted today.      Relevant Orders   CBC with Differential/Platelet     Digestive   Acid reflux    Chronic, ongoing, using Pepcid.  Continue current medication regimen and adjust as needed.  Mag level next visit.        Endocrine   Hypothyroidism    Chronic, ongoing.  Continue current medication regimen and adjust as needed based on labs.  Recent adjustment, recheck TSH, Free T4, and thyroid antibody today.       Relevant Orders   T4, free   Thyroid peroxidase antibody   TSH     Genitourinary   CKD (chronic  kidney disease) stage 3, GFR 30-59 ml/min ([place])    Noted on recent labs initially November 2021, recheck BMP today.  Urine ALB next visit, could consider addition of low dose ACE or ARB in future.  If worsening consider referral to nephrology.      Relevant Orders   Basic metabolic panel     Other   Upper airway cough syndrome    Ongoing, will continue collaboration with ENT and pulmonary.  Appreciate their input, recent labs and notes reviewed.  Recheck CBC today, suspect elevations in WBC related to Prednisone.        Follow up plan: Return in about 6 months (around 02/20/2022) for ASTHMA, MOOD, A1c check, CKD, THYROID.

## 2021-08-23 NOTE — Assessment & Plan Note (Signed)
Chronic, ongoing.  Continue current medication regimen and adjust as needed based on labs.  Recent adjustment, recheck TSH, Free T4, and thyroid antibody today.

## 2021-08-23 NOTE — Assessment & Plan Note (Signed)
Chronic, followed by pulmonary (Dr. Sherene Sires), have greatly appreciated his input.  Will continue current medication regimen and collaboration with both pulmonary and ENT, some improvement in patient symptoms noted today.

## 2021-08-24 ENCOUNTER — Other Ambulatory Visit: Payer: Self-pay | Admitting: Nurse Practitioner

## 2021-08-24 ENCOUNTER — Encounter: Payer: Self-pay | Admitting: Nurse Practitioner

## 2021-08-24 DIAGNOSIS — E063 Autoimmune thyroiditis: Secondary | ICD-10-CM

## 2021-08-24 LAB — BASIC METABOLIC PANEL
BUN/Creatinine Ratio: 11 — ABNORMAL LOW (ref 12–28)
BUN: 15 mg/dL (ref 8–27)
CO2: 25 mmol/L (ref 20–29)
Calcium: 9.3 mg/dL (ref 8.7–10.3)
Chloride: 100 mmol/L (ref 96–106)
Creatinine, Ser: 1.32 mg/dL — ABNORMAL HIGH (ref 0.57–1.00)
Glucose: 92 mg/dL (ref 65–99)
Potassium: 4.7 mmol/L (ref 3.5–5.2)
Sodium: 139 mmol/L (ref 134–144)
eGFR: 46 mL/min/{1.73_m2} — ABNORMAL LOW (ref >59)

## 2021-08-24 LAB — CBC WITH DIFFERENTIAL/PLATELET
Basophils Absolute: 0 10*3/uL (ref 0.0–0.2)
Basos: 1 %
EOS (ABSOLUTE): 0.2 10*3/uL (ref 0.0–0.4)
Eos: 3 %
Hematocrit: 42.4 % (ref 34.0–46.6)
Hemoglobin: 14.2 g/dL (ref 11.1–15.9)
Immature Grans (Abs): 0 10*3/uL (ref 0.0–0.1)
Immature Granulocytes: 0 %
Lymphocytes Absolute: 0.7 10*3/uL (ref 0.7–3.1)
Lymphs: 16 %
MCH: 31.3 pg (ref 26.6–33.0)
MCHC: 33.5 g/dL (ref 31.5–35.7)
MCV: 93 fL (ref 79–97)
Monocytes Absolute: 0.4 10*3/uL (ref 0.1–0.9)
Monocytes: 10 %
Neutrophils Absolute: 3 10*3/uL (ref 1.4–7.0)
Neutrophils: 70 %
Platelets: 248 10*3/uL (ref 150–450)
RBC: 4.54 x10E6/uL (ref 3.77–5.28)
RDW: 13.5 % (ref 11.7–15.4)
WBC: 4.4 10*3/uL (ref 3.4–10.8)

## 2021-08-24 LAB — THYROID PEROXIDASE ANTIBODY: Thyroperoxidase Ab SerPl-aCnc: 35 IU/mL — ABNORMAL HIGH (ref 0–34)

## 2021-08-24 LAB — TSH: TSH: 0.296 u[IU]/mL — ABNORMAL LOW (ref 0.450–4.500)

## 2021-08-24 LAB — T4, FREE: Free T4: 1.67 ng/dL (ref 0.82–1.77)

## 2021-08-24 MED ORDER — LEVOTHYROXINE SODIUM 100 MCG PO TABS: 100.0000 ug | ORAL_TABLET | Freq: Every day | ORAL | 3 refills | Status: DC

## 2021-08-24 NOTE — Progress Notes (Signed)
Levothyroxine dose decrease to 100 MCG from 125 MCG

## 2021-08-24 NOTE — Progress Notes (Signed)
Contacted via MyChart == please scheduled a lab only visit for 6 weeks with her, thanks. Good evening Audrey Richard, your labs have returned: - Thyroid level continues to be more on low, hyperthyroid, side.  It is a little lower this check on the TSH.  I have decreased your Levothyroxine to 100 MCG, I sent this script in.  Start taking this and stop the 125 MCG dosing.  I would like you to schedule a lab only visit to recheck this in 6 weeks please. - Kidney function continue to show some mild kidney disease, your creatinine remains mildly elevated and eGFR mildly low, we will continue to monitor this closely.  Any questions for me? Keep being amazing!!  Thank you for allowing me to participate in your care.  I appreciate you. Kindest regards, Sherran Margolis

## 2021-08-24 NOTE — Addendum Note (Signed)
Addended by: Aura Dials T on: 08/24/2021 06:01 PM   Modules accepted: Orders

## 2021-08-29 ENCOUNTER — Encounter: Payer: Self-pay | Admitting: Nurse Practitioner

## 2021-09-03 ENCOUNTER — Ambulatory Visit: Payer: BC Managed Care – PPO | Admitting: Nurse Practitioner

## 2021-09-06 ENCOUNTER — Telehealth: Payer: Self-pay | Admitting: Nurse Practitioner

## 2021-09-06 NOTE — Telephone Encounter (Addendum)
Called to schedule lab visit, no answer, left voicemail. //CC   ----- Message from Venita Lick, NP sent at 08/24/2021  6:00 PM EDT ----- Contacted via MyChart == please scheduled a lab only visit for 6 weeks with her, thanks. Good evening Audrey Richard, your labs have returned: - Thyroid level continues to be more on low, hyperthyroid, side.  It is a little lower this check on the TSH.  I have decreased your Levothyroxine to 100 MCG, I sent this script in.  Start taking this and stop the 125 MCG dosing.  I would like you to schedule a lab only visit to recheck this in 6 weeks please. - Kidney function continue to show some mild kidney disease, your creatinine remains mildly elevated and eGFR mildly low, we will continue to monitor this closely.  Any questions for me? Keep being amazing!!  Thank you for allowing me to participate in your care.  I appreciate you. Kindest regards, Jolene

## 2021-09-27 ENCOUNTER — Telehealth: Payer: Self-pay | Admitting: Pharmacy Technician

## 2021-09-27 NOTE — Telephone Encounter (Signed)
Patient has prescription drug coverage with [facility] Advantage through [employer].  No longer meets [facility]'s eligibility criteria.  Audrey Richard Care Manager [facility]   Audrey Richard [address] [place], [place]  [address]   September 26, 2021    Audrey Richard [address] [place], [place]  [address]  Dear Audrey Richard:  This is to inform you that you are no longer eligible to receive medication assistance at [facility].  The reason(s) are:    _____Your total gross monthly household income exceeds 250% of the Federal Poverty Level.   _____Tangible assets (savings, checking, stocks/bonds, pension, retirement, etc.) exceeds our limit  _____You are eligible to receive benefits from [facility], [facility] or HIV Medication            Assistance program _____You are eligible to receive benefits from a Medicare Part "D" plan __X__You have prescription insurance with [facility] Advantage _____You are not an [facility] resident _____Failure to provide all requested documentation (proof of income information for 2022., and/or Patient Intake Application, DOH Attestation, Contract, etc).    We regret that we are unable to help you at this time.  If your prescription coverage is terminated, please contact [facility], so that we may reassess your eligibility for our program.  If you have questions, we may be contacted at [phone].  Thank you,  [facility]

## 2021-10-08 DIAGNOSIS — J385 Laryngeal spasm: Secondary | ICD-10-CM | POA: Diagnosis not present

## 2021-10-08 DIAGNOSIS — J383 Other diseases of vocal cords: Secondary | ICD-10-CM | POA: Diagnosis not present

## 2021-10-08 DIAGNOSIS — J384 Edema of larynx: Secondary | ICD-10-CM | POA: Diagnosis not present

## 2021-10-08 DIAGNOSIS — J382 Nodules of vocal cords: Secondary | ICD-10-CM | POA: Diagnosis not present

## 2021-10-08 DIAGNOSIS — K219 Gastro-esophageal reflux disease without esophagitis: Secondary | ICD-10-CM | POA: Diagnosis not present

## 2021-10-08 DIAGNOSIS — J040 Acute laryngitis: Secondary | ICD-10-CM | POA: Diagnosis not present

## 2021-10-18 ENCOUNTER — Ambulatory Visit: Payer: Self-pay

## 2021-10-18 NOTE — Telephone Encounter (Signed)
Patient called, left VM to return the call to the office to discuss symptoms with a nurse.    Summary: Clinical Advice   Patient experiencing laryngitis, coughing, possible upper respiratory infection for several months. Patient sees a throat specialist but symptoms are not improving. Patient states she is negative for COVID. Patient seeking clinical advice prior to her 10/22/2021 appointment. Patient does have to work today at 2pm and may not be able to answer her phone      

## 2021-10-18 NOTE — Telephone Encounter (Signed)
Patient called, left VM to return the call to the office to discuss symptoms with a nurse.    Summary: Clinical Advice   Patient experiencing laryngitis, coughing, possible upper respiratory infection for several months. Patient sees a throat specialist but symptoms are not improving. Patient states she is negative for COVID. Patient seeking clinical advice prior to her 10/22/2021 appointment. Patient does have to work today at [employer] and may not be able to answer her phone

## 2021-10-18 NOTE — Telephone Encounter (Signed)
Called patient to review symptoms. C/o constant coughing and coughing spells interfering with job. Congestion, sneezing , blowing nose yellow mucus noted. Difficulty sleeping. Using rescue inhaler and tessalon medication for cough with minimal relief. Denies difficulty breathing no shortness of breath reported. No fever. C/o sinus infection symptoms feel like in upper head area. Throat is better but can not stop coughing. Appt 10/22/21. Care advise given. Drinking warm liquids . Patient verbalized understanding of care advise and to call back or go to [facility] or ED if symptoms worsen.     Reason for Disposition  Cough with cold symptoms (e.g., runny nose, postnasal drip, throat clearing)  Answer Assessment - Initial Assessment Questions 1. ONSET: "When did the cough begin?"      na 2. SEVERITY: "How bad is the cough today?"      Constant coughing becoming productive  3. SPUTUM: "Describe the color of your sputum" (none, dry cough; clear, white, yellow, green)     Yellow  4. HEMOPTYSIS: "Are you coughing up any blood?" If so ask: "How much?" (flecks, streaks, tablespoons, etc.)     na 5. DIFFICULTY BREATHING: "Are you having difficulty breathing?" If Yes, ask: "How bad is it?" (e.g., mild, moderate, severe)    - MILD: No SOB at rest, mild SOB with walking, speaks normally in sentences, can lie down, no retractions, pulse < 100.    - MODERATE: SOB at rest, SOB with minimal exertion and prefers to sit, cannot lie down flat, speaks in phrases, mild retractions, audible wheezing, pulse 100-120.    - SEVERE: Very SOB at rest, speaks in single words, struggling to breathe, sitting hunched forward, retractions, pulse > 120      Denies  6. FEVER: "Do you have a fever?" If Yes, ask: "What is your temperature, how was it measured, and when did it start?"     na 7. CARDIAC HISTORY: "Do you have any history of heart disease?" (e.g., heart attack, congestive heart failure)      na 8. LUNG HISTORY: "Do you have  any history of lung disease?"  (e.g., pulmonary embolus, asthma, emphysema)     Using emergency inhaler prn  9. PE RISK FACTORS: "Do you have a history of blood clots?" (or: recent major surgery, recent prolonged travel, bedridden)     na 10. OTHER SYMPTOMS: "Do you have any other symptoms?" (e.g., runny nose, wheezing, chest pain)       Runny nose , sneezing , upper respiratory issues. Cough  blow nose for yellow mucus 11. PREGNANCY: "Is there any chance you are pregnant?" "When was your last menstrual period?"       na 12. TRAVEL: "Have you traveled out of the country in the last month?" (e.g., travel history, exposures)       na  Protocols used: Cough - Acute Productive-A-AH

## 2021-10-21 NOTE — Progress Notes (Deleted)
Established Patient Office Visit  Subjective:  Patient ID: Audrey Richard, female    DOB: 12/21/1961  Age: 60 y.o. MRN: [id number]  CC: No chief complaint on file.   HPI Audrey Richard presents for ongoing cough  Past Medical History:  Diagnosis Date   Asthma    COPD (chronic obstructive pulmonary disease) ([place])    Depression    High cholesterol    Osteoporosis    Throat ulcer    Thyroid disease     Past Surgical History:  Procedure Laterality Date   TUBAL LIGATION      Family History  Problem Relation Age of Onset   Heart disease Mother    Heart disease Father    Lung disease Father    Diabetes Sister    Diabetes Maternal Grandmother    Heart disease Maternal Grandmother    Diabetes Maternal Grandfather    Heart disease Maternal Grandfather    Diabetes Sister     Social History   Socioeconomic History   Marital status: Divorced    Spouse name: Not on file   Number of children: 3   Years of education: Not on file   Highest education level: Not on file  Occupational History   Not on file  Tobacco Use   Smoking status: Former    Types: Cigarettes    Quit date: 11/15/2005    Years since quitting: 15.9   Smokeless tobacco: Never  Vaping Use   Vaping Use: Never used  Substance and Sexual Activity   Alcohol use: Not Currently    Comment: pt states on occasion   Drug use: No   Sexual activity: Yes  Other Topics Concern   Not on file  Social History Narrative   Social determinants completed. HS   Works at [employer] has 3 children.   Social Determinants of Health   Financial Resource Strain: Medium Risk   Difficulty of Paying Living Expenses: Somewhat hard  Food Insecurity: Not on file  Transportation Needs: Not on file  Physical Activity: Not on file  Stress: Stress Concern Present   Feeling of Stress : To some extent  Social Connections: Unknown   Frequency of Communication with Friends and Family: More than three times a week   Frequency  of Social Gatherings with Friends and Family: More than three times a week   Attends Religious Services: Never   Marine scientist or Organizations: No   Attends Music therapist: Never   Marital Status: Patient refused  Human resources officer Violence: Not on file    Outpatient Medications Prior to Visit  Medication Sig Dispense Refill   buPROPion (WELLBUTRIN XL) 150 MG 24 hr tablet TAKE ONE TABLET BY MOUTH EVERY DAY 90 tablet 4   cetirizine (ZYRTEC) 10 MG tablet TAKE ONE TABLET BY MOUTH EVERY DAY 90 tablet 4   Cholecalciferol 1.25 MG (50000 UT) TABS Take 1 tablet by mouth once a week. 12 tablet 2   cyclobenzaprine (FLEXERIL) 10 MG tablet Take 1 tablet (10 mg total) by mouth at bedtime. 90 tablet 3   famotidine (PEPCID) 20 MG tablet One after supper 30 tablet 11   FLUoxetine (PROZAC) 20 MG tablet Take 1 tablet (20 mg total) by mouth daily. 90 tablet 4   fluticasone (FLONASE) 50 MCG/ACT nasal spray PLACE 2 SPRAYS INTO BOTH NOSTRILS EVERY DAY 16 g 1   gabapentin (NEURONTIN) 300 MG capsule TAKE ONE CAPSULE BY MOUTH 3 TIMES A DAY 270 capsule 4  ipratropium-albuterol (DUONEB) 0.5-2.5 (3) MG/3ML SOLN Take 3 mLs by nebulization every 6 (six) hours as needed. 360 mL 2   levothyroxine (SYNTHROID) 100 MCG tablet Take 1 tablet (100 mcg total) by mouth daily. 90 tablet 3   montelukast (SINGULAIR) 10 MG tablet Take 1 tablet (10 mg total) by mouth at bedtime. 30 tablet 0   omeprazole (PRILOSEC) 40 MG capsule TAKE ONE CAPSULE BY MOUTH EVERY EVENING     predniSONE (DELTASONE) 10 MG tablet Take  4 each am x 2 days,   2 each am x 2 days,  1 each am x 2 days and stop 14 tablet 0   rosuvastatin (CRESTOR) 20 MG tablet TAKE ONE TABLET BY MOUTH EVERY DAY 90 tablet 4   traZODone (DESYREL) 50 MG tablet TAKE ONE TABLET BY MOUTH AT BEDTIME AS NEEDED 30 tablet 2   vitamin B-12 (CYANOCOBALAMIN) 500 MCG tablet Take 1 tablet (500 mcg total) by mouth daily. 90 tablet 4   No facility-administered medications  prior to visit.    Allergies  Allergen Reactions   Biaxin [Clarithromycin]     Patient reports itching, nausea   Iodine     Iodine blisters skin    ROS Review of Systems    Objective:    Physical Exam  LMP  (LMP Unknown)  Wt Readings from Last 3 Encounters:  08/23/21 195 lb 9.6 oz (88.7 kg)  08/06/21 197 lb (89.4 kg)  07/18/21 192 lb 3.2 oz (87.2 kg)     Health Maintenance Due  Topic Date Due   COVID-19 Vaccine (1) Never done   Zoster Vaccines- Shingrix (1 of 2) Never done   MAMMOGRAM  03/19/2015   INFLUENZA VACCINE  07/22/2021    There are no preventive care reminders to display for this patient.  Lab Results  Component Value Date   TSH 0.296 (L) 08/23/2021   Lab Results  Component Value Date   WBC 4.4 08/23/2021   HGB 14.2 08/23/2021   HCT 42.4 08/23/2021   MCV 93 08/23/2021   PLT 248 08/23/2021   Lab Results  Component Value Date   NA 139 08/23/2021   K 4.7 08/23/2021   CO2 25 08/23/2021   GLUCOSE 92 08/23/2021   BUN 15 08/23/2021   CREATININE 1.32 (H) 08/23/2021   BILITOT 0.3 07/17/2021   ALKPHOS 44 07/17/2021   AST 20 07/17/2021   ALT 46 (H) 07/17/2021   PROT 6.4 07/17/2021   ALBUMIN 4.3 07/17/2021   CALCIUM 9.3 08/23/2021   ANIONGAP 9 07/09/2021   EGFR 46 (L) 08/23/2021   Lab Results  Component Value Date   CHOL 167 05/14/2021   Lab Results  Component Value Date   HDL 68 05/14/2021   Lab Results  Component Value Date   LDLCALC 78 05/14/2021   Lab Results  Component Value Date   TRIG 120 05/14/2021   Lab Results  Component Value Date   CHOLHDL 3.8 10/24/2020   Lab Results  Component Value Date   HGBA1C 5.6 05/14/2021      Assessment & Plan:   Problem List Items Addressed This Visit   None   No orders of the defined types were placed in this encounter.   Follow-up: No follow-ups on file.    Charyl Dancer, NP

## 2021-10-22 ENCOUNTER — Ambulatory Visit: Payer: BC Managed Care – PPO | Admitting: Nurse Practitioner

## 2021-10-22 NOTE — Telephone Encounter (Signed)
Pt is scheduled for today at 1 pm with Lauren.

## 2021-10-23 ENCOUNTER — Encounter: Payer: Self-pay | Admitting: Nurse Practitioner

## 2021-10-23 ENCOUNTER — Other Ambulatory Visit: Payer: Self-pay

## 2021-10-23 ENCOUNTER — Ambulatory Visit (INDEPENDENT_AMBULATORY_CARE_PROVIDER_SITE_OTHER): Payer: BC Managed Care – PPO | Admitting: Nurse Practitioner

## 2021-10-23 VITALS — BP 119/78 | HR 76 | Temp 98.7°F | Ht 65.7 in | Wt 192.2 lb

## 2021-10-23 DIAGNOSIS — R051 Acute cough: Secondary | ICD-10-CM

## 2021-10-23 DIAGNOSIS — J449 Chronic obstructive pulmonary disease, unspecified: Secondary | ICD-10-CM | POA: Diagnosis not present

## 2021-10-23 DIAGNOSIS — E063 Autoimmune thyroiditis: Secondary | ICD-10-CM

## 2021-10-23 DIAGNOSIS — J320 Chronic maxillary sinusitis: Secondary | ICD-10-CM | POA: Insufficient documentation

## 2021-10-23 DIAGNOSIS — J0100 Acute maxillary sinusitis, unspecified: Secondary | ICD-10-CM | POA: Diagnosis not present

## 2021-10-23 MED ORDER — AMOXICILLIN-POT CLAVULANATE 875-125 MG PO TABS: 1.0000 | ORAL_TABLET | Freq: Two times a day (BID) | ORAL | 0 refills | Status: AC

## 2021-10-23 MED ORDER — METHYLPREDNISOLONE SODIUM SUCC 40 MG IJ SOLR
40.0000 mg | Freq: Once | INTRAMUSCULAR | Status: AC
  Administered 2021-10-23: 40 mg via INTRAMUSCULAR

## 2021-10-23 MED ORDER — BENZONATATE 200 MG PO CAPS: 200.0000 mg | ORAL_CAPSULE | Freq: Three times a day (TID) | ORAL | 0 refills | Status: DC | PRN

## 2021-10-23 MED ORDER — IPRATROPIUM-ALBUTEROL 0.5-2.5 (3) MG/3ML IN SOLN
3.0000 mL | Freq: Once | RESPIRATORY_TRACT | Status: AC
  Administered 2021-10-23: 3 mL via RESPIRATORY_TRACT

## 2021-10-23 MED ORDER — PREDNISONE 20 MG PO TABS: 40.0000 mg | ORAL_TABLET | Freq: Every day | ORAL | 0 refills | Status: AC

## 2021-10-23 NOTE — Assessment & Plan Note (Signed)
Chronic, followed by pulmonary (Dr. Sherene Sires), have greatly appreciated his input.  Will continue current medication regimen and collaboration with both pulmonary and ENT, have recommended to her if ongoing cough issues to be seen by pulmonary sooner.

## 2021-10-23 NOTE — Addendum Note (Signed)
Addended by: Pablo Ledger on: 10/23/2021 03:10 PM   Modules accepted: Orders

## 2021-10-23 NOTE — Addendum Note (Signed)
Addended byMortimer Fries on: 10/23/2021 01:54 PM   Modules accepted: Orders

## 2021-10-23 NOTE — Patient Instructions (Signed)
[facility]  Address: [address] [place], [place], [place] [address]  Phone: [phone]  Sinusitis, Adult Sinusitis is soreness and swelling (inflammation) of your sinuses. Sinuses are hollow spaces in the bones around your face. They are located: Around your eyes. In the middle of your forehead. Behind your nose. In your cheekbones. Your sinuses and nasal passages are lined with a fluid called mucus. Mucus drains out of your sinuses. Swelling can trap mucus in your sinuses. This lets germs (bacteria, virus, or fungus) grow, which leads to infection. Most of the time, this condition is caused by a virus. What are the causes? This condition is caused by: Allergies. Asthma. Germs. Things that block your nose or sinuses. Growths in the nose (nasal polyps). Chemicals or irritants in the air. Fungus (rare). What increases the risk? You are more likely to develop this condition if: You have a weak body defense system (immune system). You do a lot of swimming or diving. You use nasal sprays too much. You smoke. What are the signs or symptoms? The main symptoms of this condition are pain and a feeling of pressure around the sinuses. Other symptoms include: Stuffy nose (congestion). Runny nose (drainage). Swelling and warmth in the sinuses. Headache. Toothache. A cough that may get worse at night. Mucus that collects in the throat or the back of the nose (postnasal drip). Being unable to smell and taste. Being very tired (fatigue). A fever. Sore throat. Bad breath. How is this diagnosed? This condition is diagnosed based on: Your symptoms. Your medical history. A physical exam. Tests to find out if your condition is short-term (acute) or long-term (chronic). Your doctor may: Check your nose for growths (polyps). Check your sinuses using a tool that has a light (endoscope). Check for allergies or germs. Do imaging tests, such as an MRI or CT  scan. How is this treated? Treatment for this condition depends on the cause and whether it is short-term or long-term. If caused by a virus, your symptoms should go away on their own within 10 days. You may be given medicines to relieve symptoms. They include: Medicines that shrink swollen tissue in the nose. Medicines that treat allergies (antihistamines). A spray that treats swelling of the nostrils.  Rinses that help get rid of thick mucus in your nose (nasal saline washes). If caused by bacteria, your doctor may wait to see if you will get better without treatment. You may be given antibiotic medicine if you have: A very bad infection. A weak body defense system. If caused by growths in the nose, you may need to have surgery. Follow these instructions at home: Medicines Take, use, or apply over-the-counter and prescription medicines only as told by your doctor. These may include nasal sprays. If you were prescribed an antibiotic medicine, take it as told by your doctor. Do not stop taking the antibiotic even if you start to feel better. Hydrate and humidify  Drink enough water to keep your pee (urine) pale yellow. Use a cool mist humidifier to keep the humidity level in your home above 50%. Breathe in steam for 10-15 minutes, 3-4 times a day, or as told by your doctor. You can do this in the bathroom while a hot shower is running. Try not to spend time in cool or dry air. Rest Rest as much as you can. Sleep with your head raised (elevated). Make sure you get enough sleep each night. General instructions  Put a warm, moist  washcloth on your face 3-4 times a day, or as often as told by your doctor. This will help with discomfort. Wash your hands often with soap and water. If there is no soap and water, use hand sanitizer. Do not smoke. Avoid being around people who are smoking (secondhand smoke). Keep all follow-up visits as told by your doctor. This is important. Contact a doctor  if: You have a fever. Your symptoms get worse. Your symptoms do not get better within 10 days. Get help right away if: You have a very bad headache. You cannot stop throwing up (vomiting). You have very bad pain or swelling around your face or eyes. You have trouble seeing. You feel confused. Your neck is stiff. You have trouble breathing. Summary Sinusitis is swelling of your sinuses. Sinuses are hollow spaces in the bones around your face. This condition is caused by tissues in your nose that become inflamed or swollen. This traps germs. These can lead to infection. If you were prescribed an antibiotic medicine, take it as told by your doctor. Do not stop taking it even if you start to feel better. Keep all follow-up visits as told by your doctor. This is important. This information is not intended to replace advice given to you by your health care provider. Make sure you discuss any questions you have with your health care provider. Document Revised: 05/10/2018 Document Reviewed: 05/10/2018 Elsevier Patient Education  2022 [employer].

## 2021-10-23 NOTE — Progress Notes (Signed)
BP 119/78   Pulse 76   Temp 98.7 F (37.1 C) (Oral)   Ht 5' 5.7" (1.669 m)   Wt 192 lb 3.2 oz (87.2 kg)   LMP  (LMP Unknown)   SpO2 96%   BMI 31.31 kg/m    Subjective:    Patient ID: Audrey Richard, female    DOB: 1961/01/11, 60 y.o.   MRN: [id number]  HPI: Audrey Richard is a 60 y.o. female  Chief Complaint  Patient presents with   Cough    Pt states she has had a cough for the last 3 weeks. States she recently had laryngitis and strep and is scared she is going to loose her voice.    UPPER RESPIRATORY TRACT INFECTION Started feeling sinus symptoms 3 weeks ago which have gotten worse.  She is taking Tessalon, but feels she needs higher dose.  Constantly coughing & blowing nose with lots of yellow mucus.  Her daughter has been sick at home, not Covid Fever: none Cough: yes Shortness of breath: yes Wheezing: yes Chest pain: no Chest tightness: yes Chest congestion: yes Nasal congestion: no Runny nose: yes Post nasal drip: yes Sneezing: no Sore throat: no Swollen glands: no Sinus pressure: yes Headache: yes Face pain: yes Toothache: no Ear pain: none Ear pressure: yes bilateral Eyes red/itching:no Eye drainage/crusting: yes  Vomiting: no Rash: no Fatigue: yes Sick contacts: yes Strep contacts: no  Context: fluctuating Recurrent sinusitis: no Relief with OTC cold/cough medications: no  Treatments attempted: cold/sinus and cough syrup    ASTHMA Followed by Dr. Melvyn Novas with pulmonary -- recent note reviewed -- is suspected to have asthmatic bronchitis and chronic airway cough syndrome.    Visit 08/06/21 with pulmonary and she was referred to ENT who she had initial visit with, Dr. Joya Gaskins, on 08/19/21 -- prescribed Bactrim and Prednisone.  She is currently taking Gabapentin 300 MG QID and Pepcid for treatment.  Has Duonebs as needed, inhalers were stopped.  She returned to ENT on 10/08/21 and no changes made -- to return in 6 months.  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   Asthma status: stable Satisfied with current treatment?: yes Albuterol/rescue inhaler frequency: daily Dyspnea frequency: occasional Wheezing frequency: from throat area Cough frequency: yes Nocturnal symptom frequency: occasional cough Limitation of activity:  at times Current upper respiratory symptoms: no Triggers: heat and seasonal Last Spirometry: in August with pulmonary Failed/intolerant to following asthma meds: none Asthma meds in past: Breztri, Albuterol Aerochamber/spacer use: no Visits to ER or Urgent Care in past year: no Pneumovax: Up to Date Influenza: Up to Date    HYPOTHYROIDISM Recently changed her Levothyroxine to 100 MCG and stopped 125 MCG due to low TSH on labs on 08/23/21. Thyroid control status:stable Satisfied with current treatment? yes Medication side effects: no Medication compliance: good compliance Etiology of hypothyroidism:  Recent dose adjustment: yes Fatigue: no Cold intolerance: no Heat intolerance: no Weight gain: no Weight loss: no Constipation: yes Diarrhea/loose stools: no Palpitations: no Lower extremity edema: no Anxiety/depressed mood: no   Relevant past medical, surgical, family and social history reviewed and updated as indicated. Interim medical history since our last visit reviewed. Allergies and medications reviewed and updated.  Review of Systems  Constitutional:  Negative for activity change, appetite change, diaphoresis, fatigue and fever.  HENT:  Positive for postnasal drip, rhinorrhea, sinus pressure and sinus pain. Negative for congestion, ear discharge, ear pain, sneezing, sore throat and voice change.  Respiratory:  Positive for cough. Negative for chest tightness, shortness of breath and wheezing.   Cardiovascular:  Negative for chest pain, palpitations and leg swelling.  Gastrointestinal: Negative.   Endocrine: Negative for cold intolerance and heat intolerance.   Neurological: Negative.   Psychiatric/Behavioral: Negative.     Per HPI unless specifically indicated above     Objective:    BP 119/78   Pulse 76   Temp 98.7 F (37.1 C) (Oral)   Ht 5' 5.7" (1.669 m)   Wt 192 lb 3.2 oz (87.2 kg)   LMP  (LMP Unknown)   SpO2 96%   BMI 31.31 kg/m   Wt Readings from Last 3 Encounters:  10/23/21 192 lb 3.2 oz (87.2 kg)  08/23/21 195 lb 9.6 oz (88.7 kg)  08/06/21 197 lb (89.4 kg)    Physical Exam Vitals and nursing note reviewed.  Constitutional:      General: She is awake. She is not in acute distress.    Appearance: She is well-developed and well-groomed. She is obese. She is not ill-appearing or toxic-appearing.  HENT:     Head: Normocephalic.     Comments: Hoarseness per baseline noted.    Right Ear: Hearing, ear canal and external ear normal. A middle ear effusion is present.     Left Ear: Hearing, ear canal and external ear normal. A middle ear effusion is present.     Nose: Rhinorrhea present. Rhinorrhea is clear.     Right Sinus: Maxillary sinus tenderness present. No frontal sinus tenderness.     Left Sinus: Maxillary sinus tenderness present. No frontal sinus tenderness.     Mouth/Throat:     Lips: Pink.     Mouth: Mucous membranes are moist.     Pharynx: Posterior oropharyngeal erythema (with cobblestoning) present. No pharyngeal swelling or oropharyngeal exudate.  Eyes:     General: Lids are normal.        Right eye: No discharge.        Left eye: No discharge.     Conjunctiva/sclera: Conjunctivae normal.     Pupils: Pupils are equal, round, and reactive to light.  Neck:     Thyroid: No thyromegaly.     Vascular: No carotid bruit.  Cardiovascular:     Rate and Rhythm: Normal rate and regular rhythm.     Heart sounds: Normal heart sounds. No murmur heard.   No gallop.  Pulmonary:     Effort: Pulmonary effort is normal. No accessory muscle usage or respiratory distress.     Breath sounds: Wheezing present. No decreased  breath sounds or rhonchi.     Comments: Expiratory wheezes noted throughout intermittent. Abdominal:     General: Bowel sounds are normal.     Palpations: Abdomen is soft.  Musculoskeletal:     Cervical back: Normal range of motion and neck supple.     Right lower leg: No edema.     Left lower leg: No edema.  Lymphadenopathy:     Cervical: No cervical adenopathy.  Skin:    General: Skin is warm and dry.  Neurological:     Mental Status: She is alert and oriented to person, place, and time.     Deep Tendon Reflexes: Reflexes are normal and symmetric.     Reflex Scores:      Brachioradialis reflexes are 2+ on the right side and 2+ on the left side.      Patellar reflexes are 2+ on the right side and 2+ on the  left side. Psychiatric:        Attention and Perception: Attention normal.        Mood and Affect: Mood normal.        Speech: Speech normal.        Behavior: Behavior normal. Behavior is cooperative.        Thought Content: Thought content normal.   Results for orders placed or performed in visit on 53/64/68  Basic metabolic panel  Result Value Ref Range   Glucose 92 65 - 99 mg/dL   BUN 15 8 - 27 mg/dL   Creatinine, Ser 1.32 (H) 0.57 - 1.00 mg/dL   eGFR 46 (L) >59 mL/min/1.73   BUN/Creatinine Ratio 11 (L) 12 - 28   Sodium 139 134 - 144 mmol/L   Potassium 4.7 3.5 - 5.2 mmol/L   Chloride 100 96 - 106 mmol/L   CO2 25 20 - 29 mmol/L   Calcium 9.3 8.7 - 10.3 mg/dL  T4, free  Result Value Ref Range   Free T4 1.67 0.82 - 1.77 ng/dL  Thyroid peroxidase antibody  Result Value Ref Range   Thyroperoxidase Ab SerPl-aCnc 35 (H) 0 - 34 IU/mL  TSH  Result Value Ref Range   TSH 0.296 (L) 0.450 - 4.500 uIU/mL  CBC with Differential/Platelet  Result Value Ref Range   WBC 4.4 3.4 - 10.8 x10E3/uL   RBC 4.54 3.77 - 5.28 x10E6/uL   Hemoglobin 14.2 11.1 - 15.9 g/dL   Hematocrit 42.4 34.0 - 46.6 %   MCV 93 79 - 97 fL   MCH 31.3 26.6 - 33.0 pg   MCHC 33.5 31.5 - 35.7 g/dL   RDW  13.5 11.7 - 15.4 %   Platelets 248 150 - 450 x10E3/uL   Neutrophils 70 Not Estab. %   Lymphs 16 Not Estab. %   Monocytes 10 Not Estab. %   Eos 3 Not Estab. %   Basos 1 Not Estab. %   Neutrophils Absolute 3.0 1.4 - 7.0 x10E3/uL   Lymphocytes Absolute 0.7 0.7 - 3.1 x10E3/uL   Monocytes Absolute 0.4 0.1 - 0.9 x10E3/uL   EOS (ABSOLUTE) 0.2 0.0 - 0.4 x10E3/uL   Basophils Absolute 0.0 0.0 - 0.2 x10E3/uL   Immature Granulocytes 0 Not Estab. %   Immature Grans (Abs) 0.0 0.0 - 0.1 x10E3/uL      Assessment & Plan:   Problem List Items Addressed This Visit       Respiratory   Asthmatic bronchitis , chronic (HCC) - Primary    Chronic, followed by pulmonary (Dr. Melvyn Novas), have greatly appreciated his input.  Will continue current medication regimen and collaboration with both pulmonary and ENT, have recommended to her if ongoing cough issues to be seen by pulmonary sooner.      Relevant Medications   albuterol (VENTOLIN HFA) 108 (90 Base) MCG/ACT inhaler   ipratropium-albuterol (DUONEB) 0.5-2.5 (3) MG/3ML nebulizer solution 3 mL   methylPREDNISolone sodium succinate (SOLU-MEDROL) 40 mg/mL injection 40 mg (Start on 10/23/2021  1:45 PM)   predniSONE (DELTASONE) 20 MG tablet   Maxillary sinusitis    Acute,ongoing for 3 weeks.  At this time will provide Duoneb in office and 40 MG Solu Medrol.  Will send in 7 days of Augmentin + Prednisone 40 MG daily x 5 days and Tessalon refill.  Covid and Flu testing in office today. Recommend: - Increased rest - Increasing Fluids - Acetaminophen as needed for fever/pain.  - Salt water gargling, chloraseptic spray and throat lozenges - Mucinex.  Return to office in one week for follow-up.      Relevant Medications   methylPREDNISolone sodium succinate (SOLU-MEDROL) 40 mg/mL injection 40 mg (Start on 10/23/2021  1:45 PM)   amoxicillin-clavulanate (AUGMENTIN) 875-125 MG tablet   predniSONE (DELTASONE) 20 MG tablet     Endocrine   Hashimoto's thyroiditis     Chronic, ongoing.  Continue current medication regimen and adjust as needed based on labs. Recent adjustment, recheck TSH, Free T4 today.         Follow up plan: Return in about 1 week (around 10/30/2021) for Sinusitis with cough.

## 2021-10-23 NOTE — Assessment & Plan Note (Signed)
Chronic, ongoing.  Continue current medication regimen and adjust as needed based on labs. Recent adjustment, recheck TSH, Free T4 today.

## 2021-10-23 NOTE — Assessment & Plan Note (Signed)
Acute,ongoing for 3 weeks.  At this time will provide Duoneb in office and 40 MG Solu Medrol.  Will send in 7 days of Augmentin + Prednisone 40 MG daily x 5 days and Tessalon refill.  Covid and Flu testing in office today. Recommend: - Increased rest - Increasing Fluids - Acetaminophen as needed for fever/pain.  - Salt water gargling, chloraseptic spray and throat lozenges - Mucinex.  Return to office in one week for follow-up.

## 2021-10-24 LAB — T4, FREE: Free T4: 1.28 ng/dL (ref 0.82–1.77)

## 2021-10-24 LAB — VERITOR FLU A/B WAIVED
Influenza A: NEGATIVE
Influenza B: NEGATIVE

## 2021-10-24 LAB — TSH: TSH: 0.697 u[IU]/mL (ref 0.450–4.500)

## 2021-10-24 LAB — SARS-COV-2, NAA 2 DAY TAT

## 2021-10-24 LAB — NOVEL CORONAVIRUS, NAA: SARS-CoV-2, NAA: NOT DETECTED

## 2021-10-24 NOTE — Progress Notes (Signed)
Contacted via MyChart   Good morning Audrey Richard, your labs have returned with exception of Covid.  That is still pending.  Your flu is negative.  Thyroid labs are now in normal range, continue current 100 MCG Levothyroxine dosing.  Any questions? Keep being awesome!!  Thank you for allowing me to participate in your care.  I appreciate you. Kindest regards, Gemini Beaumier

## 2021-10-24 NOTE — Progress Notes (Signed)
Contacted via MyChart   Good evening Audrey Richard, your Covid testing is negative:)

## 2021-11-06 ENCOUNTER — Ambulatory Visit: Payer: BC Managed Care – PPO | Admitting: Nurse Practitioner

## 2021-12-11 ENCOUNTER — Ambulatory Visit (INDEPENDENT_AMBULATORY_CARE_PROVIDER_SITE_OTHER): Payer: BC Managed Care – PPO | Admitting: Nurse Practitioner

## 2021-12-11 ENCOUNTER — Other Ambulatory Visit: Payer: Self-pay

## 2021-12-11 ENCOUNTER — Encounter: Payer: Self-pay | Admitting: Nurse Practitioner

## 2021-12-11 VITALS — BP 113/74 | HR 84 | Temp 98.4°F | Wt 193.2 lb

## 2021-12-11 DIAGNOSIS — J449 Chronic obstructive pulmonary disease, unspecified: Secondary | ICD-10-CM | POA: Diagnosis not present

## 2021-12-11 DIAGNOSIS — J309 Allergic rhinitis, unspecified: Secondary | ICD-10-CM

## 2021-12-11 DIAGNOSIS — Z23 Encounter for immunization: Secondary | ICD-10-CM | POA: Diagnosis not present

## 2021-12-11 DIAGNOSIS — Z6831 Body mass index (BMI) 31.0-31.9, adult: Secondary | ICD-10-CM

## 2021-12-11 DIAGNOSIS — E6609 Other obesity due to excess calories: Secondary | ICD-10-CM

## 2021-12-11 DIAGNOSIS — E669 Obesity, unspecified: Secondary | ICD-10-CM | POA: Insufficient documentation

## 2021-12-11 MED ORDER — MONTELUKAST SODIUM 10 MG PO TABS: 10.0000 mg | ORAL_TABLET | Freq: Every day | ORAL | 4 refills | Status: DC

## 2021-12-11 MED ORDER — FLUTICASONE PROPIONATE 50 MCG/ACT NA SUSP: NASAL | 1 refills | Status: DC

## 2021-12-11 MED ORDER — ALBUTEROL SULFATE HFA 108 (90 BASE) MCG/ACT IN AERS: 2.0000 | INHALATION_SPRAY | Freq: Four times a day (QID) | RESPIRATORY_TRACT | 4 refills | Status: DC | PRN

## 2021-12-11 MED ORDER — FLUTICASONE-SALMETEROL 250-50 MCG/ACT IN AEPB: 1.0000 | INHALATION_SPRAY | Freq: Two times a day (BID) | RESPIRATORY_TRACT | 4 refills | Status: DC

## 2021-12-11 MED ORDER — BENZONATATE 200 MG PO CAPS: 200.0000 mg | ORAL_CAPSULE | Freq: Three times a day (TID) | ORAL | 4 refills | Status: DC | PRN

## 2021-12-11 NOTE — Progress Notes (Signed)
BP 113/74    Pulse 84    Temp 98.4 F (36.9 C)    Wt 193 lb 3.2 oz (87.6 kg)    LMP  (LMP Unknown)    SpO2 96%    BMI 31.47 kg/m    Subjective:    Patient ID: Audrey Richard, female    DOB: September 18, 1961, 60 y.o.   MRN: [id number]  HPI: Audrey Richard is a 60 y.o. female  Chief Complaint  Patient presents with   Sinusitis    Patient states she has completed antibiotics and states when she takes Amoxicillin all she does is gets a yeast infection. Patient states the cough is lingering. Patient states she does better with Tessalon Perles. Patient states she works in [employer] are starting to notice it. Patient states she has about 5-6 asthma attacks per day and she is eating up her emergency inhaler.    ASTHMA AND COUGH Treated on 10/23/21 for maxillary sinusitis -- treated with Augmentin and Prednisone.  She reports the Tessalon helps a lot with her cough.  States current cough has improved some, but she has been eating up Albuterol due to asthma attacks.  No maintenance inhaler at this time, was on Breztri and Advair in past.  She reports feeling worse since stopping maintenance.  History: Followed by Dr. Sherene Sires with pulmonary -- is suspected to have asthmatic bronchitis and chronic airway cough syndrome.     Visit 08/06/21 with pulmonary and she was referred to ENT who she had initial visit with, Dr. Delford Field, on 08/19/21 -- prescribed Bactrim and Prednisone.  She is currently taking Gabapentin 300 MG QID and Pepcid for treatment.  Has Duonebs as needed, inhalers were stopped.  She returned to ENT on 10/08/21 and no changes made -- to return in 6 months.  Have ulcerative laryngitis and is doing speech therapy, seen 10/08/21 -- insurance would not cover further sessions. Duration: weeks Cough severity: moderate -- worse in morning Cough description: non-productive Aggravating factors:  worse in the AM Alleviating factors: cough syrup Status:  fluctuating Treatments attempted: as  above Wheezing: yes Shortness of breath: no Chest pain: no Chest tightness:yes Nasal congestion: no Runny nose: no Postnasal drip: yes Frequent throat clearing or swallowing: yes Hemoptysis: no Fevers: no Night sweats: no Weight loss: no Heartburn: no Recent foreign travel: no Tuberculosis contacts: no  Relevant past medical, surgical, family and social history reviewed and updated as indicated. Interim medical history since our last visit reviewed. Allergies and medications reviewed and updated.  Review of Systems  Constitutional:  Negative for activity change, appetite change, diaphoresis, fatigue and fever.  HENT:  Positive for postnasal drip and rhinorrhea. Negative for congestion, ear discharge, ear pain, sinus pressure, sinus pain, sneezing, sore throat and voice change.   Respiratory:  Positive for cough. Negative for chest tightness, shortness of breath and wheezing.   Cardiovascular:  Negative for chest pain, palpitations and leg swelling.  Gastrointestinal: Negative.   Endocrine: Negative for cold intolerance and heat intolerance.  Neurological: Negative.   Psychiatric/Behavioral: Negative.     Per HPI unless specifically indicated above     Objective:    BP 113/74    Pulse 84    Temp 98.4 F (36.9 C)    Wt 193 lb 3.2 oz (87.6 kg)    LMP  (LMP Unknown)    SpO2 96%    BMI 31.47 kg/m   Wt Readings from Last 3 Encounters:  12/11/21 193 lb 3.2 oz (  87.6 kg)  10/23/21 192 lb 3.2 oz (87.2 kg)  08/23/21 195 lb 9.6 oz (88.7 kg)    Physical Exam Vitals and nursing note reviewed.  Constitutional:      General: She is awake. She is not in acute distress.    Appearance: She is well-developed and well-groomed. She is obese. She is not ill-appearing or toxic-appearing.  HENT:     Head: Normocephalic.     Comments: Hoarseness per baseline noted.    Right Ear: Hearing, tympanic membrane, ear canal and external ear normal. No middle ear effusion.     Left Ear: Hearing,  tympanic membrane, ear canal and external ear normal.  No middle ear effusion.     Nose: No rhinorrhea.     Right Sinus: No maxillary sinus tenderness or frontal sinus tenderness.     Left Sinus: No maxillary sinus tenderness or frontal sinus tenderness.     Mouth/Throat:     Lips: Pink.     Mouth: Mucous membranes are moist.     Pharynx: Posterior oropharyngeal erythema (with cobblestoning) present. No pharyngeal swelling or oropharyngeal exudate.  Eyes:     General: Lids are normal.        Right eye: No discharge.        Left eye: No discharge.     Conjunctiva/sclera: Conjunctivae normal.     Pupils: Pupils are equal, round, and reactive to light.  Neck:     Thyroid: No thyromegaly.     Vascular: No carotid bruit.  Cardiovascular:     Rate and Rhythm: Normal rate and regular rhythm.     Heart sounds: Normal heart sounds. No murmur heard.   No gallop.  Pulmonary:     Effort: Pulmonary effort is normal. No accessory muscle usage or respiratory distress.     Breath sounds: Wheezing present. No decreased breath sounds or rhonchi.     Comments: Expiratory wheezes noted throughout intermittent. Abdominal:     General: Bowel sounds are normal.     Palpations: Abdomen is soft.  Musculoskeletal:     Cervical back: Normal range of motion and neck supple.     Right lower leg: No edema.     Left lower leg: No edema.  Lymphadenopathy:     Cervical: No cervical adenopathy.  Skin:    General: Skin is warm and dry.  Neurological:     Mental Status: She is alert and oriented to person, place, and time.     Deep Tendon Reflexes: Reflexes are normal and symmetric.     Reflex Scores:      Brachioradialis reflexes are 2+ on the right side and 2+ on the left side.      Patellar reflexes are 2+ on the right side and 2+ on the left side. Psychiatric:        Attention and Perception: Attention normal.        Mood and Affect: Mood normal.        Speech: Speech normal.        Behavior: Behavior  normal. Behavior is cooperative.        Thought Content: Thought content normal.    Results for orders placed or performed in visit on 10/23/21  Novel Coronavirus, NAA ([facility])   Specimen: Nasopharyngeal(NP) swabs in vial transport medium  Result Value Ref Range   SARS-CoV-2, NAA Not Detected Not Detected  SARS-COV-2, NAA 2 DAY TAT  Result Value Ref Range   SARS-CoV-2, NAA 2 DAY TAT Performed  T4, free  Result Value Ref Range   Free T4 1.28 0.82 - 1.77 ng/dL  TSH  Result Value Ref Range   TSH 0.697 0.450 - 4.500 uIU/mL  Veritor Flu A/B Waived  Result Value Ref Range   Influenza A Negative Negative   Influenza B Negative Negative      Assessment & Plan:   Problem List Items Addressed This Visit       Respiratory   Allergic rhinitis    Chronic, ongoing issue with underlying COPD.  At this time taking Zytrec and uses Flonase with minimal benefit.  Out of Singulair refills, send these in.  Return in 4 weeks.      Relevant Medications   fluticasone (FLONASE) 50 MCG/ACT nasal spray   Asthmatic bronchitis , chronic (HCC) - Primary    Chronic, followed by pulmonary (Dr. Sherene Sires), have greatly appreciated his input.  Will continue current medication regimen, but trial back Advair at this time until she gets into see pulmonary, and continue collaboration with both pulmonary and ENT, have recommended to her if ongoing cough issues to be seen by pulmonary sooner.  Return in 4 weeks.      Relevant Medications   albuterol (VENTOLIN HFA) 108 (90 Base) MCG/ACT inhaler   fluticasone-salmeterol (ADVAIR) 250-50 MCG/ACT AEPB   montelukast (SINGULAIR) 10 MG tablet     Other   Obesity    BMI 31.47.  Recommended eating smaller high protein, low fat meals more frequently and exercising 30 mins a day 5 times a week with a goal of 10-15lb weight loss in the next 3 months. Patient voiced their understanding and motivation to adhere to these recommendations.       Other Visit Diagnoses      Flu vaccine need       Flu vaccine today   Relevant Orders   Flu Vaccine QUAD 6+ mos PF IM (Fluarix Quad PF) (Completed)        Follow up plan: Return in about 4 weeks (around 01/08/2022) for Asthma.

## 2021-12-11 NOTE — Assessment & Plan Note (Signed)
Chronic, followed by pulmonary (Dr. Sherene Sires), have greatly appreciated his input.  Will continue current medication regimen, but trial back Advair at this time until she gets into see pulmonary, and continue collaboration with both pulmonary and ENT, have recommended to her if ongoing cough issues to be seen by pulmonary sooner.  Return in 4 weeks.

## 2021-12-11 NOTE — Assessment & Plan Note (Signed)
BMI 31.47.  Recommended eating smaller high protein, low fat meals more frequently and exercising 30 mins a day 5 times a week with a goal of 10-15lb weight loss in the next 3 months. Patient voiced their understanding and motivation to adhere to these recommendations.  

## 2021-12-11 NOTE — Patient Instructions (Addendum)
Melatonin 5 to 10 MG at night for sleep  Asthma, Adult Asthma is a long-term (chronic) condition in which the airways get tight and narrow. The airways are the breathing passages that lead from the nose and mouth down into the lungs. A person with asthma will have times when symptoms get worse. These are called asthma attacks. They can cause coughing, whistling sounds when you breathe (wheezing), shortness of breath, and chest pain. They can make it hard to breathe. There is no cure for asthma, but medicines and lifestyle changes can help control it. There are many things that can bring on an asthma attack or make asthma symptoms worse (triggers). Common triggers include: Mold. Dust. Cigarette smoke. Cockroaches. Things that can cause allergy symptoms (allergens). These include animal skin flakes (dander) and pollen from trees or grass. Things that pollute the air. These may include household cleaners, wood smoke, smog, or chemical odors. Cold air, weather changes, and wind. Crying or laughing hard. Stress. Certain medicines or drugs. Certain foods such as dried fruit, potato chips, and grape juice. Infections, such as a cold or the flu. Certain medical conditions or diseases. Exercise or tiring activities. Asthma may be treated with medicines and by staying away from the things that cause asthma attacks. Types of medicines may include: Controller medicines. These help prevent asthma symptoms. They are usually taken every day. Fast-acting reliever or rescue medicines. These quickly relieve asthma symptoms. They are used as needed and provide short-term relief. Allergy medicines if your attacks are brought on by allergens. Medicines to help control the body's defense (immune) system. Follow these instructions at home: Avoiding triggers in your home Change your heating and air conditioning filter often. Limit your use of fireplaces and wood stoves. Get rid of pests (such as roaches and  mice) and their droppings. Throw away plants if you see mold on them. Clean your floors. Dust regularly. Use cleaning products that do not smell. Have someone vacuum when you are not home. Use a vacuum cleaner with a HEPA filter if possible. Replace carpet with wood, tile, or vinyl flooring. Carpet can trap animal skin flakes and dust. Use allergy-proof pillows, mattress covers, and box spring covers. Wash bed sheets and blankets every week in hot water. Dry them in a dryer. Keep your bedroom free of any triggers. Avoid pets and keep windows closed when things that cause allergy symptoms are in the air. Use blankets that are made of polyester or cotton. Clean bathrooms and kitchens with bleach. If possible, have someone repaint the walls in these rooms with mold-resistant paint. Keep out of the rooms that are being cleaned and painted. Wash your hands often with soap and water. If soap and water are not available, use hand sanitizer. Do not allow anyone to smoke in your home. General instructions Take over-the-counter and prescription medicines only as told by your doctor. Talk with your doctor if you have questions about how or when to take your medicines. Make note if you need to use your medicines more often than usual. Do not use any products that contain nicotine or tobacco, such as cigarettes and e-cigarettes. If you need help quitting, ask your doctor. Stay away from secondhand smoke. Avoid doing things outdoors when allergen counts are high and when air quality is low. Wear a ski mask when doing outdoor activities in the winter. The mask should cover your nose and mouth. Exercise indoors on cold days if you can. Warm up before you exercise. Take time to  cool down after exercise. Use a peak flow meter as told by your doctor. A peak flow meter is a tool that measures how well the lungs are working. Keep track of the peak flow meter's readings. Write them down. Follow your asthma action  plan. This is a written plan for taking care of your asthma and treating your attacks. Make sure you get all the shots (vaccines) that your doctor recommends. Ask your doctor about a flu shot and a pneumonia shot. Keep all follow-up visits as told by your doctor. This is important. Contact a doctor if: You have wheezing, shortness of breath, or a cough even while taking medicine to prevent attacks. The mucus you cough up (sputum) is thicker than usual. The mucus you cough up changes from clear or white to yellow, green, gray, or bloody. You have problems from the medicine you are taking, such as: A rash. Itching. Swelling. Trouble breathing. You need reliever medicines more than 2-3 times a week. Your peak flow reading is still at 50-79% of your personal best after following the action plan for 1 hour. You have a fever. Get help right away if: You seem to be worse and are not responding to medicine during an asthma attack. You are short of breath even at rest. You get short of breath when doing very little activity. You have trouble eating, drinking, or talking. You have chest pain or tightness. You have a fast heartbeat. Your lips or fingernails start to turn blue. You are light-headed or dizzy, or you faint. Your peak flow is less than 50% of your personal best. You feel too tired to breathe normally. Summary Asthma is a long-term (chronic) condition in which the airways get tight and narrow. An asthma attack can make it hard to breathe. Asthma cannot be cured, but medicines and lifestyle changes can help control it. Make sure you understand how to avoid triggers and how and when to use your medicines. This information is not intended to replace advice given to you by your health care provider. Make sure you discuss any questions you have with your health care provider. Document Revised: 04/01/2020 Document Reviewed: 04/11/2020 Elsevier Patient Education  2022 [employer].

## 2021-12-11 NOTE — Assessment & Plan Note (Signed)
Chronic, ongoing issue with underlying COPD.  At this time taking Zytrec and uses Flonase with minimal benefit.  Out of Singulair refills, send these in.  Return in 4 weeks.

## 2021-12-17 ENCOUNTER — Ambulatory Visit: Payer: BC Managed Care – PPO | Admitting: Nurse Practitioner

## 2021-12-27 ENCOUNTER — Other Ambulatory Visit: Payer: Self-pay | Admitting: Nurse Practitioner

## 2021-12-27 DIAGNOSIS — F331 Major depressive disorder, recurrent, moderate: Secondary | ICD-10-CM

## 2021-12-27 NOTE — Telephone Encounter (Signed)
Requested Prescriptions  Pending Prescriptions Disp Refills   traZODone (DESYREL) 50 MG tablet [Pharmacy Med Name: traZODone HCl 50 MG Oral Tablet] 30 tablet 0    Sig: TAKE 1 TABLET BY MOUTH AT BEDTIME AS NEEDED     Psychiatry: Antidepressants - Serotonin Modulator Passed - 12/27/2021 12:18 PM      Passed - Completed PHQ-2 or PHQ-9 in the last 360 days      Passed - Valid encounter within last 6 months    Recent Outpatient Visits          2 weeks ago Asthmatic bronchitis , chronic ([place])   [place], Scribner T, NP   2 months ago Asthmatic bronchitis , chronic ([place])   [place], Ranburne T, NP   4 months ago Asthmatic bronchitis , chronic ([place])   [place] Cannady, Jolene T, NP   5 months ago COPD exacerbation ([place])   [place] Cannady, Jolene T, NP   6 months ago Stage 2 moderate COPD by GOLD classification ([place])   [place], Barbaraann Faster, NP      Future Appointments            In 2 weeks Cannady, Barbaraann Faster, NP [facility], [place]   In 2 months Cannady, Barbaraann Faster, NP [facility], [facility]

## 2022-01-13 IMAGING — CR DG CHEST 2V
1 series · 2 of 2 positions shown · non-contrast
Comparison: 01/22/2021

CLINICAL DATA: Cough

EXAM:
CHEST - 2 VIEW

[Series 1: w chest pa · 0.14mm/px · 2 of 2 slices shown]
[im 1/2]
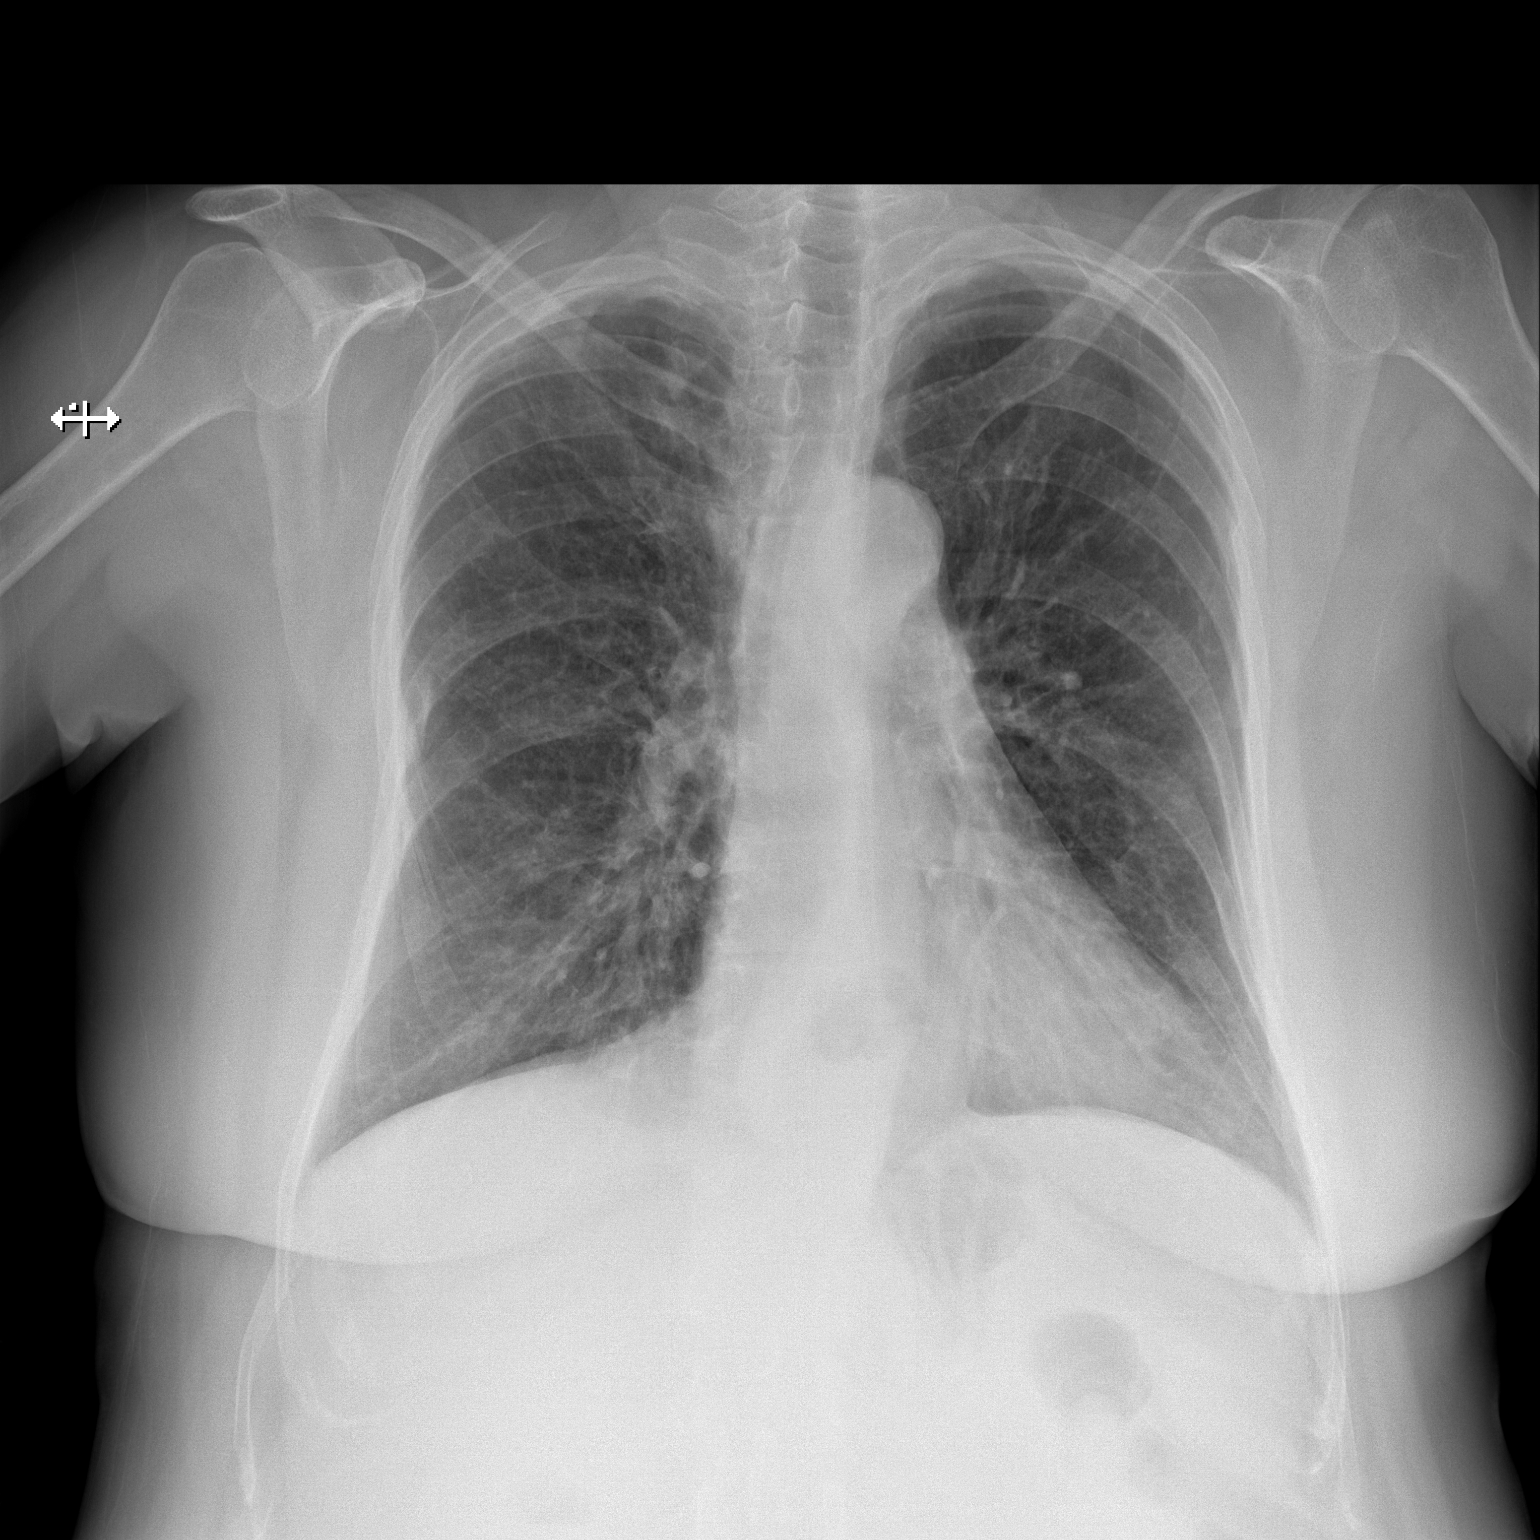
[im 2/2]
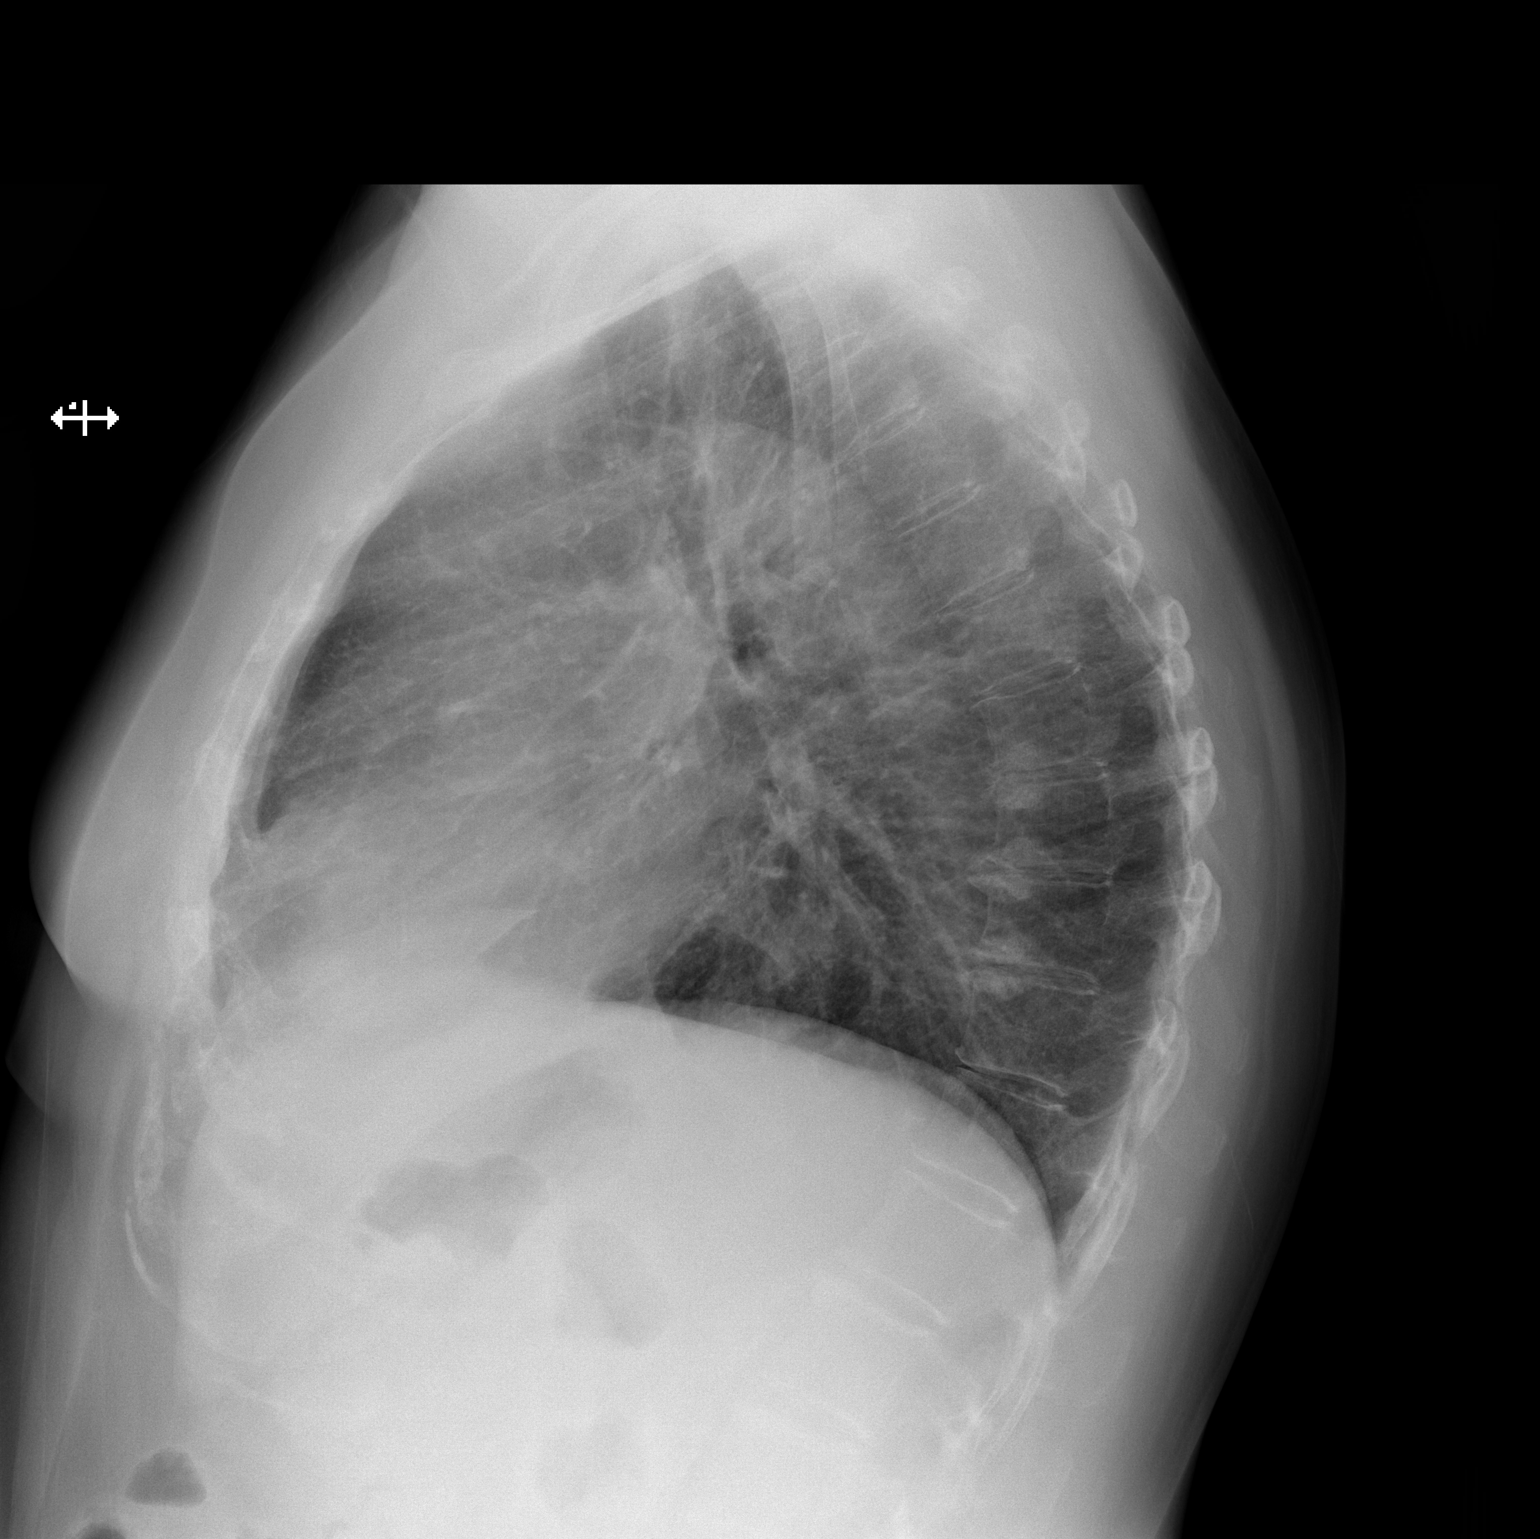

[2 of 2 positions shown; findings below may reference images not displayed]

FINDINGS: Bilateral chronic interstitial thickening. No focal consolidation.
No pleural effusion or pneumothorax. Heart and mediastinal contours
are unremarkable.

No acute osseous abnormality. Old healed bilateral rib fractures.
IMPRESSION: No active cardiopulmonary disease.

## 2022-01-14 ENCOUNTER — Ambulatory Visit: Payer: BC Managed Care – PPO | Admitting: Nurse Practitioner

## 2022-02-25 ENCOUNTER — Ambulatory Visit: Payer: BC Managed Care – PPO | Admitting: Nurse Practitioner

## 2022-02-28 ENCOUNTER — Other Ambulatory Visit: Payer: Self-pay | Admitting: Nurse Practitioner

## 2022-02-28 DIAGNOSIS — G8929 Other chronic pain: Secondary | ICD-10-CM

## 2022-02-28 DIAGNOSIS — F331 Major depressive disorder, recurrent, moderate: Secondary | ICD-10-CM

## 2022-02-28 DIAGNOSIS — J309 Allergic rhinitis, unspecified: Secondary | ICD-10-CM

## 2022-02-28 DIAGNOSIS — M5442 Lumbago with sciatica, left side: Secondary | ICD-10-CM

## 2022-02-28 MED ORDER — TRAZODONE HCL 50 MG PO TABS: 50.0000 mg | ORAL_TABLET | Freq: Every evening | ORAL | 0 refills | Status: DC | PRN

## 2022-02-28 MED ORDER — FAMOTIDINE 20 MG PO TABS: ORAL_TABLET | ORAL | 11 refills | Status: DC

## 2022-02-28 MED ORDER — FLUTICASONE PROPIONATE 50 MCG/ACT NA SUSP: NASAL | 1 refills | Status: DC

## 2022-02-28 MED ORDER — GABAPENTIN 300 MG PO CAPS: ORAL_CAPSULE | Freq: Three times a day (TID) | ORAL | 4 refills | Status: DC

## 2022-02-28 NOTE — Telephone Encounter (Signed)
Requested Prescriptions  ?Pending Prescriptions Disp Refills  ?? famotidine (PEPCID) 20 MG tablet 30 tablet 11  ?  Sig: One after supper  ?  ? Gastroenterology:  H2 Antagonists Passed - 02/28/2022 11:40 AM  ?  ?  Passed - Valid encounter within last 12 months  ?  Recent Outpatient Visits   ?      ? 2 months ago Asthmatic bronchitis , chronic (HCC)  ? [facility] [place], [place] T, NP  ? 4 months ago Asthmatic bronchitis , chronic (HCC)  ? [facility] [place], Stronach T, NP  ? 6 months ago Asthmatic bronchitis , chronic (HCC)  ? [facility] [place], Astor T, NP  ? 7 months ago COPD exacerbation (HCC)  ? [facility] [place], Atkins T, NP  ? 8 months ago Stage 2 moderate COPD by GOLD classification (HCC)  ? [facility] [place], Corrie Dandy T, NP  ?  ?  ?Future Appointments   ?        ? In 2 weeks Cannady, Dorie Rank, NP [facility]  ?  ? ?  ?  ?  ?? fluticasone (FLONASE) 50 MCG/ACT nasal spray 16 g 1  ?  Sig: PLACE 2 SPRAYS INTO BOTH NOSTRILS EVERY DAY  ?  ? Not Delegated - Ear, Nose, and Throat: Nasal Preparations - Corticosteroids Failed - 02/28/2022 11:40 AM  ?  ?  Failed - This refill cannot be delegated  ?  ?  Passed - Valid encounter within last 12 months  ?  Recent Outpatient Visits   ?      ? 2 months ago Asthmatic bronchitis , chronic (HCC)  ? [facility] [place], [place] T, NP  ? 4 months ago Asthmatic bronchitis , chronic (HCC)  ? [facility] [place], [place] T, NP  ? 6 months ago Asthmatic bronchitis , chronic (HCC)  ? [facility] [place], [place]oy[place] T, NP  ? 7 months ago COPD exacerbation (HCC)  ? [facility] [place], Grafton T, NP  ? 8 months ago Stage 2 moderate COPD by GOLD classification (HCC)  ? [facility] [place], Corrie Dandy T, NP  ?  ?  ?Future Appointments   ?        ? In 2 weeks Cannady, Dorie Rank, NP [facility]  ?  ? ?  ?  ?  ?? gabapentin (NEURONTIN)  300 MG capsule 270 capsule 4  ?  Sig: TAKE ONE CAPSULE BY MOUTH 3 TIMES A DAY  ?  ? Neurology: Anticonvulsants - gabapentin Failed - 02/28/2022 11:40 AM  ?  ?  Failed - Cr in normal range and within 360 days  ?  Creatinine  ?Date Value Ref Range Status  ?02/14/2014 1.15 0.60 - 1.30 mg/dL Final  ? ?Creatinine, Ser  ?Date Value Ref Range Status  ?08/23/2021 1.32 (H) 0.57 - 1.00 mg/dL Final  ?   ?  ?  Passed - Completed PHQ-2 or PHQ-9 in the last 360 days  ?  ?  Passed - Valid encounter within last 12 months  ?  Recent Outpatient Visits   ?      ? 2 months ago Asthmatic bronchitis , chronic (HCC)  ? [facility] [place], Virden T, NP  ? 4 months ago Asthmatic bronchitis , chronic (HCC)  ? [facility] [place], [place] T, NP  ? 6 months ago Asthmatic bronchitis , chronic (HCC)  ? [facility] [place],  Jolene T, NP  ? 7 months ago COPD exacerbation ([place])  ? [place], [place] T, NP  ? 8 months ago Stage 2 moderate COPD by GOLD classification ([place])  ? [facility] [place], Henrine Screws T, NP  ?  ?  ?Future Appointments   ?        ? In 2 weeks Cannady, Barbaraann Faster, NP [facility]  ?  ? ?  ?  ?  ?? traZODone (DESYREL) 50 MG tablet 30 tablet 0  ?  Sig: Take 1 tablet (50 mg total) by mouth at bedtime as needed.  ?  ? Psychiatry: Antidepressants - Serotonin Modulator Passed - 02/28/2022 11:40 AM  ?  ?  Passed - Completed PHQ-2 or PHQ-9 in the last 360 days  ?  ?  Passed - Valid encounter within last 6 months  ?  Recent Outpatient Visits   ?      ? 2 months ago Asthmatic bronchitis , chronic ([place])  ? [place], Blandon T, NP  ? 4 months ago Asthmatic bronchitis , chronic ([place])  ? [place], Jansen T, NP  ? 6 months ago Asthmatic bronchitis , chronic ([place])  ? [place], Kaplan T, NP  ? 7 months ago COPD exacerbation ([place])  ? [place], Shelby T, NP  ? 8 months ago  Stage 2 moderate COPD by GOLD classification ([place])  ? [facility] [place], Henrine Screws T, NP  ?  ?  ?Future Appointments   ?        ? In 2 weeks Cannady, Barbaraann Faster, NP [facility]  ?  ? ?  ?  ?  ? ? ?

## 2022-02-28 NOTE — Telephone Encounter (Signed)
Medication Refill - Medication: traZODone (DESYREL) 50 MG tablet ? ?famotidine (PEPCID) 20 MG  ? ?fluticasone (FLONASE) 50 MCG/ACT nasal ? ?gabapentin (NEURONTIN) 300 MG capsule ? ?Has the patient contacted their pharmacy? Yes.   ?(Agent: If no, request that the patient contact the pharmacy for the refill. If patient does not wish to contact the pharmacy document the reason why and proceed with request.) ?(Agent: If yes, when and what did the pharmacy advise?) the pharmacy tells her to call the office  ? ?Preferred Pharmacy (with phone number or street name): [facility] [place] [address]  ?[place], [place] [address]  ?Phone:  [phone]  Fax:  [phone]  ?Has the patient been seen for an appointment in the last year OR does the patient have an upcoming appointment? Yes.   ? ?Agent: Please be advised that RX refills may take up to 3 business days. We ask that you follow-up with your pharmacy. ?

## 2022-02-28 NOTE — Telephone Encounter (Signed)
Requested medication (s) are due for refill today: yes and no ? ?Requested medication (s) are on the active medication list: yes ? ?Last refill:  pepcid 07/18/21 #30/11, flonase 12/11/21 #16g/1, gabapentin 05/14/21 #270/4 ? ?Future visit scheduled: yes ? ?Notes to clinic:  pt should have refills left on Pepcid and Gabapentin, pepcid was signed by another provider and Flonase is not delegated ? ? ?  ?Requested Prescriptions  ?Pending Prescriptions Disp Refills  ? famotidine (PEPCID) 20 MG tablet 30 tablet 11  ?  Sig: One after supper  ?  ? Gastroenterology:  H2 Antagonists Passed - 02/28/2022 11:40 AM  ?  ?  Passed - Valid encounter within last 12 months  ?  Recent Outpatient Visits   ? ?      ? 2 months ago Asthmatic bronchitis , chronic ([place])  ? [place], Stanley T, NP  ? 4 months ago Asthmatic bronchitis , chronic ([place])  ? [place], Wyatt T, NP  ? 6 months ago Asthmatic bronchitis , chronic ([place])  ? [place], Lima T, NP  ? 7 months ago COPD exacerbation ([place])  ? [place], Fredonia T, NP  ? 8 months ago Stage 2 moderate COPD by GOLD classification ([place])  ? [facility] [place], Henrine Screws T, NP  ? ?  ?  ?Future Appointments   ? ?        ? In 2 weeks Cannady, Barbaraann Faster, NP [facility]  ? ?  ? ?  ?  ?  ? fluticasone (FLONASE) 50 MCG/ACT nasal spray 16 g 1  ?  Sig: PLACE 2 SPRAYS INTO BOTH NOSTRILS EVERY DAY  ?  ? Not Delegated - Ear, Nose, and Throat: Nasal Preparations - Corticosteroids Failed - 02/28/2022 11:40 AM  ?  ?  Failed - This refill cannot be delegated  ?  ?  Passed - Valid encounter within last 12 months  ?  Recent Outpatient Visits   ? ?      ? 2 months ago Asthmatic bronchitis , chronic ([place])  ? [place], [place] T, NP  ? 4 months ago Asthmatic bronchitis , chronic ([place])  ? [place], [place] T, NP  ? 6 months ago Asthmatic bronchitis , chronic  ([place])  ? [place], [place]y [place] T, NP  ? 7 months ago COPD exacerbation ([place])  ? [place], [place]ville T, NP  ? 8 months ago Stage 2 moderate COPD by GOLD classification ([place])  ? [facility] [place], Henrine Screws T, NP  ? ?  ?  ?Future Appointments   ? ?        ? In 2 weeks Cannady, Barbaraann Faster, NP [facility]  ? ?  ? ?  ?  ?  ? gabapentin (NEURONTIN) 300 MG capsule 270 capsule 4  ?  Sig: TAKE ONE CAPSULE BY MOUTH 3 TIMES A DAY  ?  ? Neurology: Anticonvulsants - gabapentin Failed - 02/28/2022 11:40 AM  ?  ?  Failed - Cr in normal range and within 360 days  ?  Creatinine  ?Date Value Ref Range Status  ?02/14/2014 1.15 0.60 - 1.30 mg/dL Final  ? ?Creatinine, Ser  ?Date Value Ref Range Status  ?08/23/2021 1.32 (H) 0.57 - 1.00 mg/dL Final  ?  ?  ?  ?  Passed - Completed PHQ-2 or PHQ-9 in the last 360 days  ?  ?  Passed - Valid encounter within last 12 months  ?  Recent Outpatient Visits   ? ?      ? 2 months ago Asthmatic bronchitis , chronic ([place])  ? [place], [place] Hall T, NP  ? 4 months ago Asthmatic bronchitis , chronic ([place])  ? [place], [place] T, NP  ? 6 months ago Asthmatic bronchitis , chronic ([place])  ? [place], Fallon T, NP  ? 7 months ago COPD exacerbation ([place])  ? [place], Piney Point T, NP  ? 8 months ago Stage 2 moderate COPD by GOLD classification ([place])  ? [facility] [place], Henrine Screws T, NP  ? ?  ?  ?Future Appointments   ? ?        ? In 2 weeks Cannady, Barbaraann Faster, NP [facility]  ? ?  ? ?  ?  ?  ?Signed Prescriptions Disp Refills  ? traZODone (DESYREL) 50 MG tablet 30 tablet 0  ?  Sig: Take 1 tablet (50 mg total) by mouth at bedtime as needed.  ?  ? Psychiatry: Antidepressants - Serotonin Modulator Passed - 02/28/2022 11:40 AM  ?  ?  Passed - Completed PHQ-2 or PHQ-9 in the last 360 days  ?  ?  Passed - Valid encounter within last 6  months  ?  Recent Outpatient Visits   ? ?      ? 2 months ago Asthmatic bronchitis , chronic ([place])  ? [place], Covington T, NP  ? 4 months ago Asthmatic bronchitis , chronic ([place])  ? [place], [place] Acres T, NP  ? 6 months ago Asthmatic bronchitis , chronic ([place])  ? [place], Arabi T, NP  ? 7 months ago COPD exacerbation ([place])  ? [place], Shelbina T, NP  ? 8 months ago Stage 2 moderate COPD by GOLD classification ([place])  ? [facility] [place], Henrine Screws T, NP  ? ?  ?  ?Future Appointments   ? ?        ? In 2 weeks Cannady, Barbaraann Faster, NP [facility]  ? ?  ? ?  ?  ?  ? ?

## 2022-03-15 NOTE — Patient Instructions (Addendum)
Pataday eye drops and Xyzal oral medication for allergies. ? ?Hypothyroidism ?Hypothyroidism is when the thyroid gland does not make enough of certain hormones (it is underactive). The thyroid gland is a small gland located in the lower front part of the neck, just in front of the windpipe (trachea). This gland makes hormones that help control how the body uses food for energy (metabolism) as well as how the heart and brain function. These hormones also play a role in keeping your bones strong. When the thyroid is underactive, it produces too little of the hormones thyroxine (T4) and triiodothyronine (T3). ?What are the causes? ?This condition may be caused by: ?Hashimoto's disease. This is a disease in which the body's disease-fighting system (immune system) attacks the thyroid gland. This is the most common cause. ?Viral infections. ?Pregnancy. ?Certain medicines. ?Birth defects. ?Past radiation treatments to the head or neck for cancer. ?Past treatment with radioactive iodine. ?Past exposure to radiation in the environment. ?Past surgical removal of part or all of the thyroid. ?Problems with a gland in the center of the brain (pituitary gland). ?Lack of enough iodine in the diet. ?What increases the risk? ?You are more likely to develop this condition if: ?You are female. ?You have a family history of thyroid conditions. ?You use a medicine called lithium. ?You take medicines that affect the immune system (immunosuppressants). ?What are the signs or symptoms? ?Symptoms of this condition include: ?Feeling as though you have no energy (lethargy). ?Not being able to tolerate cold. ?Weight gain that is not explained by a change in diet or exercise habits. ?Lack of appetite. ?Dry skin. ?Coarse hair. ?Menstrual irregularity. ?Slowing of thought processes. ?Constipation. ?Sadness or depression. ?How is this diagnosed? ?This condition may be diagnosed based on: ?Your symptoms, your medical history, and a physical  exam. ?Blood tests. ?You may also have imaging tests, such as an ultrasound or MRI. ?How is this treated? ?This condition is treated with medicine that replaces the thyroid hormones that your body does not make. After you begin treatment, it may take several weeks for symptoms to go away. ?Follow these instructions at home: ?Take over-the-counter and prescription medicines only as told by your health care provider. ?If you start taking any new medicines, tell your health care provider. ?Keep all follow-up visits as told by your health care provider. This is important. ?As your condition improves, your dosage of thyroid hormone medicine may change. ?You will need to have blood tests regularly so that your health care provider can monitor your condition. ?Contact a health care provider if: ?Your symptoms do not get better with treatment. ?You are taking thyroid hormone replacement medicine and you: ?Sweat a lot. ?Have tremors. ?Feel anxious. ?Lose weight rapidly. ?Cannot tolerate heat. ?Have emotional swings. ?Have diarrhea. ?Feel weak. ?Get help right away if you have: ?Chest pain. ?An irregular heartbeat. ?A rapid heartbeat. ?Difficulty breathing. ?Summary ?Hypothyroidism is when the thyroid gland does not make enough of certain hormones (it is underactive). ?When the thyroid is underactive, it produces too little of the hormones thyroxine (T4) and triiodothyronine (T3). ?The most common cause is Hashimoto's disease, a disease in which the body's disease-fighting system (immune system) attacks the thyroid gland. The condition can also be caused by viral infections, medicine, pregnancy, or past radiation treatment to the head or neck. ?Symptoms may include weight gain, dry skin, constipation, feeling as though you do not have energy, and not being able to tolerate cold. ?This condition is treated with medicine to replace the  thyroid hormones that your body does not make. ?This information is not intended to replace  advice given to you by your health care provider. Make sure you discuss any questions you have with your health care provider. ?Document Revised: 08/15/2021 Document Reviewed: 08/23/2020 ?Elsevier Patient Education ? 2022 [employer] ? ?

## 2022-03-17 ENCOUNTER — Encounter: Payer: Self-pay | Admitting: Nurse Practitioner

## 2022-03-17 ENCOUNTER — Ambulatory Visit: Payer: BC Managed Care – PPO | Admitting: Nurse Practitioner

## 2022-03-17 ENCOUNTER — Other Ambulatory Visit: Payer: Self-pay

## 2022-03-17 VITALS — BP 102/67 | HR 77 | Temp 97.9°F | Ht 65.7 in | Wt 197.4 lb

## 2022-03-17 DIAGNOSIS — F324 Major depressive disorder, single episode, in partial remission: Secondary | ICD-10-CM

## 2022-03-17 DIAGNOSIS — E063 Autoimmune thyroiditis: Secondary | ICD-10-CM | POA: Diagnosis not present

## 2022-03-17 DIAGNOSIS — N1831 Chronic kidney disease, stage 3a: Secondary | ICD-10-CM

## 2022-03-17 DIAGNOSIS — E559 Vitamin D deficiency, unspecified: Secondary | ICD-10-CM

## 2022-03-17 DIAGNOSIS — Z6832 Body mass index (BMI) 32.0-32.9, adult: Secondary | ICD-10-CM

## 2022-03-17 DIAGNOSIS — F5104 Psychophysiologic insomnia: Secondary | ICD-10-CM

## 2022-03-17 DIAGNOSIS — E782 Mixed hyperlipidemia: Secondary | ICD-10-CM | POA: Diagnosis not present

## 2022-03-17 DIAGNOSIS — R058 Other specified cough: Secondary | ICD-10-CM

## 2022-03-17 DIAGNOSIS — E538 Deficiency of other specified B group vitamins: Secondary | ICD-10-CM | POA: Diagnosis not present

## 2022-03-17 DIAGNOSIS — I700 Atherosclerosis of aorta: Secondary | ICD-10-CM

## 2022-03-17 DIAGNOSIS — J301 Allergic rhinitis due to pollen: Secondary | ICD-10-CM

## 2022-03-17 DIAGNOSIS — G8929 Other chronic pain: Secondary | ICD-10-CM

## 2022-03-17 DIAGNOSIS — J449 Chronic obstructive pulmonary disease, unspecified: Secondary | ICD-10-CM

## 2022-03-17 DIAGNOSIS — R7309 Other abnormal glucose: Secondary | ICD-10-CM

## 2022-03-17 DIAGNOSIS — E6609 Other obesity due to excess calories: Secondary | ICD-10-CM

## 2022-03-17 DIAGNOSIS — K219 Gastro-esophageal reflux disease without esophagitis: Secondary | ICD-10-CM

## 2022-03-17 DIAGNOSIS — Z23 Encounter for immunization: Secondary | ICD-10-CM

## 2022-03-17 DIAGNOSIS — M5441 Lumbago with sciatica, right side: Secondary | ICD-10-CM

## 2022-03-17 DIAGNOSIS — M5442 Lumbago with sciatica, left side: Secondary | ICD-10-CM

## 2022-03-17 DIAGNOSIS — J4489 Other specified chronic obstructive pulmonary disease: Secondary | ICD-10-CM

## 2022-03-17 DIAGNOSIS — L03012 Cellulitis of left finger: Secondary | ICD-10-CM

## 2022-03-17 DIAGNOSIS — E66811 Obesity, class 1: Secondary | ICD-10-CM

## 2022-03-17 LAB — MICROALBUMIN, URINE WAIVED
Creatinine, Urine Waived: 200 mg/dL (ref 10–300)
Microalb, Ur Waived: 10 mg/L (ref 0–19)
Microalb/Creat Ratio: <30 mg/g (ref <30)

## 2022-03-17 LAB — BAYER DCA HB A1C WAIVED: HB A1C (BAYER DCA - WAIVED): 5.4 % (ref 4.8–5.6)

## 2022-03-17 MED ORDER — GABAPENTIN 300 MG PO CAPS: ORAL_CAPSULE | ORAL | 4 refills | Status: DC

## 2022-03-17 MED ORDER — MUPIROCIN 2 % EX OINT: 1.0000 "application " | TOPICAL_OINTMENT | Freq: Two times a day (BID) | CUTANEOUS | 3 refills | Status: DC

## 2022-03-17 MED ORDER — OLOPATADINE HCL 0.1 % OP SOLN: 1.0000 [drp] | Freq: Two times a day (BID) | OPHTHALMIC | 12 refills | Status: DC

## 2022-03-17 MED ORDER — TRIAMCINOLONE ACETONIDE 0.5 % EX OINT: 1.0000 "application " | TOPICAL_OINTMENT | Freq: Two times a day (BID) | CUTANEOUS | 0 refills | Status: DC

## 2022-03-17 MED ORDER — SULFAMETHOXAZOLE-TRIMETHOPRIM 800-160 MG PO TABS: 1.0000 | ORAL_TABLET | Freq: Two times a day (BID) | ORAL | 0 refills | Status: AC

## 2022-03-17 MED ORDER — ROSUVASTATIN CALCIUM 20 MG PO TABS: ORAL_TABLET | Freq: Every day | ORAL | 4 refills | Status: DC

## 2022-03-17 MED ORDER — CYCLOBENZAPRINE HCL 10 MG PO TABS: 10.0000 mg | ORAL_TABLET | Freq: Every day | ORAL | 3 refills | Status: DC

## 2022-03-17 MED ORDER — BUPROPION HCL ER (XL) 150 MG PO TB24: ORAL_TABLET | Freq: Every day | ORAL | 4 refills | Status: DC

## 2022-03-17 MED ORDER — FLUOXETINE HCL 20 MG PO TABS: 20.0000 mg | ORAL_TABLET | Freq: Every day | ORAL | 4 refills | Status: DC

## 2022-03-17 MED ORDER — TRAZODONE HCL 50 MG PO TABS: 50.0000 mg | ORAL_TABLET | Freq: Every evening | ORAL | 5 refills | Status: DC | PRN

## 2022-03-17 NOTE — Assessment & Plan Note (Signed)
Chronic, ongoing.  Continue on supplement and adjust as needed. Check level today. 

## 2022-03-17 NOTE — Assessment & Plan Note (Addendum)
Noted on recent labs initially November 2021, recheck CMP today.  Urine ALB today, could consider addition of low dose ACE or ARB in future.  If worsening consider referral to nephrology. ?

## 2022-03-17 NOTE — Assessment & Plan Note (Addendum)
Chronic, ongoing.  Continue current medication regimen and adjust as needed based on labs. Recheck TSH, Free T4 today. ? ?

## 2022-03-17 NOTE — Progress Notes (Signed)
? ?BP 102/67   Pulse 77   Temp 97.9 ?F (36.6 ?C) (Oral)   Ht 5' 5.7" (1.669 m)   Wt 197 lb 6.4 oz (89.5 kg)   LMP  (LMP Unknown)   BMI 32.15 kg/m?   ? ?Subjective:  ? ? Patient ID: Audrey Richard, female    DOB: 12/08/1961, 61 y.o.   MRN: [id number] ? ?HPI: ?Audrey Richard is a 61 y.o. female ? ?Chief Complaint  ?Patient presents with  ? Mood  ? Asthma  ? A1c Check  ? Chronic Kidney Disease  ? Hypothyroidism  ? Eye Drainage  ?  Patient states every other day she is noticing when she gets up and go out, she will have some watery eyes. Patient thinks it may be her allergy and she has tried over the counter medications/eye drops and nothing seems to help her. Patient think it may be the pollen effecting her, but she would like to discuss with provider.   ? Nail Problem  ?  Patient states she noticed a spot on her cuticle of her middle finger on her left. Patient states it starting to drain from the area with a clear thick jelly discharge. Patient states she has noticed her fingernail is turning yellow and it has dented her finger nail. Patient states when the area fills back up with discharge it hurts constantly.   ? ?SKIN INFECTION ?To left middle finger with a clear, thick jelly substance that comes out.  Has left indent on finger nail.  She drains it.  Has been present for 2 months. ?Duration: months ?Location: left middle finger ?History of trauma in area: no ?Pain: yes ?Quality: yes ?Severity: mild ?Redness: yes ?Swelling: yes ?Oozing: yes ?Pus: no ?Fevers: no ?Nausea/vomiting: no ?Status: fluctuating ?Treatments attempted:warm compresses  ?Tetanus: Not UTD  ? ?HYPERLIPIDEMIA ?Continues on Rosuvastatin 20 MG.   ?Hyperlipidemia status: good compliance ?Satisfied with current treatment?  yes ?Side effects:  no ?Medication compliance: good compliance ?Supplements: none ?Aspirin:  no ?The 10-year ASCVD risk score (Arnett DK, et al., 2019) is: 1.6% ?  Values used to calculate the score: ?    Age: 75 years ?     Sex: Female ?    Is Non-Hispanic African American: No ?    Diabetic: No ?    Tobacco smoker: No ?    Systolic Blood Pressure: 559 mmHg ?    Is BP treated: No ?    HDL Cholesterol: 68 mg/dL ?    Total Cholesterol: 167 mg/dL ?Chest pain:  no ?Coronary artery disease:  no ?Family history CAD:  yes ?Family history early CAD:  no  ?  ?HYPOTHYROID ?Taking Levothyroxine 100 MCG daily. ?  ?Continues to take Pepcid PRN and Omeprazole daily for GERD. ?Fatigue: none ?Cold intolerance: yes ?Heat intolerance: no ?Weight gain: none ?Weight loss: no ?Constipation: yes ?Diarrhea/loose stools: no ?Palpitations: no ?Lower extremity edema: sometimes ?Anxiety/depressed mood: yes ?  ?ASTHMA AND ALLERGIES ?Continues on Advair BID and Albuterol PRN. ? ?History: Followed by Dr. Melvyn Novas with pulmonary -- is suspected to have asthmatic bronchitis and chronic airway cough syndrome.   ?  ?Visit 08/06/21 with pulmonary and she was referred to ENT who she had initial visit with, Dr. Joya Gaskins, on 08/19/21 -- treated with Bactrim and Prednisone.  She is currently taking Gabapentin 300 MG TID and Pepcid for treatment.  Has Duonebs as needed, has not needed these lately.  She returned to ENT on 10/08/21 and no changes made --  to return in 6 months.  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 ? ?Continues on Singulair nightly for allergies -- also taking Flonase nasal.  Has tried all the OTC allergy medications without benefit.   ?COPD status: stable ?Satisfied with current treatment?: yes ?Oxygen use: no ?Dyspnea frequency: none ?Cough frequency: none ?Rescue inhaler frequency:  1 - 2 times a week ?Limitation of activity: no ?Productive cough: none ?Last Spirometry: with pulmonary ?Pneumovax: Up to Date ?Influenza: Up to Date  ?  ?CHRONIC PAIN  ?Currently taking Flexeril and Gabapentin for pain.  Recent labs noted negative ANA and normal ESR and CRP.  Last imaging 11/23/2019 did noted mild degenerative changes to lumbar spine.  Stands 5 days a week  on firm concrete -- they will not obtain mat or stool.  Current CrCl 64. ?  ?Has low Vitamin D level and B12 -- taking supplements.   ?Pain control status: stable ?Duration: chronic ?Location: lower back ?Quality: dull and aching ?Current Pain Level: 5/10 ?Previous Pain Level: 7/10 ?What Activities task can be accomplished with current medication? Work and maintaining her house ?Previous pain specialty evaluation: no ?Non-narcotic analgesic meds: yes ?  ?DEPRESSION ?Currently taking Wellbutrin + Prozac, and Trazodone, which she takes as a sleep aide. ?Mood status: stable ?Satisfied with current treatment?: yes ?Symptom severity: moderate  ?Duration of current treatment : chronic ?Side effects: no ?Medication compliance: good compliance ?Psychotherapy/counseling: none ?Previous psychiatric medications: none ?Depressed mood: yes ?Anxious mood: no ?Anhedonia: no ?Significant weight loss or gain: no ?Insomnia: none ?Fatigue: yes ?Feelings of worthlessness or guilt: no ?Impaired concentration/indecisiveness: no ?Suicidal ideations: no ?Hopelessness: no ?Crying spells: no ? ?  03/17/2022  ?  3:17 PM 12/11/2021  ?  3:54 PM 06/18/2021  ?  3:07 PM 03/06/2021  ? 11:16 AM 02/05/2021  ? 10:11 AM  ?Depression screen PHQ 2/9  ?Decreased Interest _0 0 1  ?Down, Depressed, Hopeless _1 ?PHQ - 2 Score _2 ?Altered sleeping _3 0 0  ?Tired, decreased energy _4 ?Change in appetite _5 ?Feeling bad or failure about yourself  1 1 0 0 2  ?Trouble concentrating 0 0 0 3 0  ?Moving slowly or fidgety/restless 1 0 0 0 0  ?Suicidal thoughts 0 0 0 0 0  ?PHQ-9 Score _6 ?Difficult doing work/chores  Somewhat difficult Somewhat difficult Somewhat difficult   ?  ? ?  03/17/2022  ?  3:18 PM 12/11/2021  ?  3:54 PM 03/06/2021  ? 11:17 AM 02/05/2021  ? 10:06 AM  ?GAD 7 : Generalized Anxiety Score  ?Nervous, Anxious, on Edge _7 ?Control/stop worrying _8 ?Worry too much - different things _9 ?Trouble  relaxing _10 ?Restless 2 0 1 1  ?Easily annoyed or irritable 1 1 0 0  ?Afraid - awful might happen 1 0 0 1  ?Total GAD 7 Score _11 ?Anxiety Difficulty Very difficult Somewhat difficult Somewhat difficult   ? ?Relevant past medical, surgical, family and social history reviewed and updated as indicated. Interim medical history since our last visit reviewed. ?Allergies and medications reviewed and updated. ? ?Review of Systems  ?Constitutional:  Negative for activity change, appetite change, diaphoresis, fatigue and fever.  ?Eyes:  Positive for itching.  ?Respiratory:  Negative  for cough, chest tightness, shortness of breath and wheezing.   ?Cardiovascular:  Negative for chest pain, palpitations and leg swelling.  ?Gastrointestinal: Negative.   ?Endocrine: Negative for cold intolerance and heat intolerance.  ?Musculoskeletal:  Positive for arthralgias.  ?Skin:  Positive for wound.  ?Neurological: Negative.   ?Psychiatric/Behavioral: Negative.    ? ?Per HPI unless specifically indicated above ? ?   ?Objective:  ?  ?BP 102/67   Pulse 77   Temp 97.9 ?F (36.6 ?C) (Oral)   Ht 5' 5.7" (1.669 m)   Wt 197 lb 6.4 oz (89.5 kg)   LMP  (LMP Unknown)   BMI 32.15 kg/m?   ?Wt Readings from Last 3 Encounters:  ?03/17/22 197 lb 6.4 oz (89.5 kg)  ?12/11/21 193 lb 3.2 oz (87.6 kg)  ?10/23/21 192 lb 3.2 oz (87.2 kg)  ?  ?Physical Exam ?Vitals and nursing note reviewed.  ?Constitutional:   ?   General: She is awake. She is not in acute distress. ?   Appearance: She is well-developed and well-groomed. She is obese. She is not ill-appearing or toxic-appearing.  ?HENT:  ?   Head: Normocephalic.  ?   Comments: Mild hoarseness. ?   Right Ear: Hearing normal.  ?   Left Ear: Hearing normal.  ?Eyes:  ?   General: Lids are normal. No allergic shiner.    ?   Right eye: No discharge.     ?   Left eye: No discharge.  ?   Extraocular Movements: Extraocular movements intact.  ?   Conjunctiva/sclera: Conjunctivae normal.  ?   Pupils:  Pupils are equal, round, and reactive to light.  ?   Visual Fields: Right eye visual fields normal and left eye visual fields normal.  ?Neck:  ?   Thyroid: No thyromegaly.  ?   Vascular: No carotid bruit.  ?Cardiov

## 2022-03-17 NOTE — Assessment & Plan Note (Addendum)
Chronic, ongoing, using Pepcid & Omeprazole.  Continue current medication regimen and adjust as needed.  Mag level next visit. ?

## 2022-03-17 NOTE — Assessment & Plan Note (Signed)
Chronic, followed by pulmonary (Dr. Melvyn Novas), have greatly appreciated his input.  Will continue current medication regimen, Advair and Albuterol, and continue collaboration with both pulmonary and ENT, have recommended to her if ongoing cough issues to be seen by pulmonary sooner.  Return in 6 months. ?

## 2022-03-17 NOTE — Assessment & Plan Note (Signed)
Chronic, ongoing.  She reports benefit from Flexeril and Gabapentin, but would like some changes to help more at night.  CrCl 64 -- will increase Gabapentin to 300 MG QAM and noon, then 600 MG at HS.  Could consider repeat imaging if worsening or MRI.  Discussed with her possibly changing her Prozac to Duloxetine in future which may offer more benefit to both mood and pain level with her Gabapentin.  Consider ortho or PT in future.  Note written for her work. ?

## 2022-03-17 NOTE — Assessment & Plan Note (Signed)
Chronic, ongoing.  Continue current medication regimen and adjust as needed. Lipid panel today. 

## 2022-03-17 NOTE — Assessment & Plan Note (Signed)
Chronic, ongoing issue with underlying COPD.  Continue Singulair and Flonase + add on Xyzal and Pataday.  May need return to allergist in future + allergy injections. ?

## 2022-03-17 NOTE — Assessment & Plan Note (Addendum)
Noted on imaging 12/30/2020 -- continue statin therapy. ?

## 2022-03-17 NOTE — Assessment & Plan Note (Signed)
Chronic, ongoing.  Denies SI/HI.  At this time continue Wellbutrin and Prozac + Trazodone as needed for sleep.  Could consider in future discontinuation of Prozac and change to Duloxetine which may also benefit her chronic pain, along with mood.  Return in 6 months for mood. 

## 2022-03-17 NOTE — Assessment & Plan Note (Signed)
Chronic, ongoing.  At this time will continue Trazodone as needed for sleep.  Recommend she trial Melatonin vs taking Trazodone -- she reports will try this.  Could consider sleep study in future if ongoing issue.  ?

## 2022-03-17 NOTE — Assessment & Plan Note (Signed)
To left middle finger, recommend she apply warm compresses to area TID.  Will start Bactrim and Mupirocin to assist with remaining infection, appears to have drained on own.  Kenalog ointment to place to area as needed.  Return for worsening or ongoing. ?

## 2022-03-17 NOTE — Assessment & Plan Note (Signed)
BMI 32.15.  Recommended eating smaller high protein, low fat meals more frequently and exercising 30 mins a day 5 times a week with a goal of 10-15lb weight loss in the next 3 months. Patient voiced their understanding and motivation to adhere to these recommendations. ? ?

## 2022-03-17 NOTE — Assessment & Plan Note (Signed)
A1c check today due to past elevations, initiate medication as needed. 

## 2022-03-17 NOTE — Assessment & Plan Note (Signed)
Ongoing, will continue collaboration with ENT and pulmonary.  Appreciate their input, recent labs and notes reviewed.   

## 2022-03-18 LAB — COMPREHENSIVE METABOLIC PANEL
ALT: 15 IU/L (ref 0–32)
AST: 18 IU/L (ref 0–40)
Albumin/Globulin Ratio: 2.5 — ABNORMAL HIGH (ref 1.2–2.2)
Albumin: 4.5 g/dL (ref 3.8–4.9)
Alkaline Phosphatase: 52 IU/L (ref 44–121)
BUN/Creatinine Ratio: 10 — ABNORMAL LOW (ref 12–28)
BUN: 11 mg/dL (ref 8–27)
Bilirubin Total: 0.4 mg/dL (ref 0.0–1.2)
CO2: 25 mmol/L (ref 20–29)
Calcium: 9.2 mg/dL (ref 8.7–10.3)
Chloride: 103 mmol/L (ref 96–106)
Creatinine, Ser: 1.06 mg/dL — ABNORMAL HIGH (ref 0.57–1.00)
Globulin, Total: 1.8 g/dL (ref 1.5–4.5)
Glucose: 77 mg/dL (ref 70–99)
Potassium: 4.3 mmol/L (ref 3.5–5.2)
Sodium: 143 mmol/L (ref 134–144)
Total Protein: 6.3 g/dL (ref 6.0–8.5)
eGFR: 60 mL/min/{1.73_m2} (ref >59)

## 2022-03-18 LAB — VITAMIN B12: Vitamin B-12: 374 pg/mL (ref 232–1245)

## 2022-03-18 LAB — TSH: TSH: 6.47 u[IU]/mL — ABNORMAL HIGH (ref 0.450–4.500)

## 2022-03-18 LAB — LIPID PANEL W/O CHOL/HDL RATIO
Cholesterol, Total: 139 mg/dL (ref 100–199)
HDL: 62 mg/dL (ref >39)
LDL Chol Calc (NIH): 61 mg/dL (ref 0–99)
Triglycerides: 81 mg/dL (ref 0–149)
VLDL Cholesterol Cal: 16 mg/dL (ref 5–40)

## 2022-03-18 LAB — T4, FREE: Free T4: 1.27 ng/dL (ref 0.82–1.77)

## 2022-03-18 LAB — VITAMIN D 25 HYDROXY (VIT D DEFICIENCY, FRACTURES): Vit D, 25-Hydroxy: 68.7 ng/mL (ref 30.0–100.0)

## 2022-03-18 MED ORDER — LEVOTHYROXINE SODIUM 125 MCG PO TABS: 125.0000 ug | ORAL_TABLET | Freq: Every day | ORAL | 3 refills | Status: DC

## 2022-03-18 NOTE — Addendum Note (Signed)
Addended by: Aura Dials T on: 03/18/2022 02:14 PM ? ? Modules accepted: Orders ? ?

## 2022-03-18 NOTE — Progress Notes (Signed)
Contacted via MyChart -- needs 6 weeks lab only visit please ? ? ?Good afternoon Audrey Richard, your labs have returned: ?- CBC remains stable with no anemia or infection. ?- Thyroid labs show elevation in TSH and normal Free T4, I recommend we increase your Levothyroxine to 125 MCG daily and stop 100 MCG dosing.  I will send this in and would like to see you back in 6 weeks for lab only visit to recheck.  Staff will call to schedule this. ?- Cholesterol levels remain stable, continue Rosuvastatin. ?- Vitamin D level is improved, continue supplement. ?- B12 level is still a little low, ensure you are taking Vitamin B12 1000 MCG daily. ?Keep being amazing!!  Thank you for allowing me to participate in your care.  I appreciate you. ?Kindest regards, ?Deon Duer ? ?

## 2022-04-28 ENCOUNTER — Other Ambulatory Visit: Payer: BC Managed Care – PPO

## 2022-05-12 ENCOUNTER — Other Ambulatory Visit: Payer: Self-pay | Admitting: Nurse Practitioner

## 2022-05-12 DIAGNOSIS — J309 Allergic rhinitis, unspecified: Secondary | ICD-10-CM

## 2022-05-12 NOTE — Telephone Encounter (Signed)
Medication Refill - Medication:  fluticasone (FLONASE) 50 MCG/ACT nasal spray   Cholecalciferol (VITAMIN D3) 1.25 MG (50000 UT) CAPS [Pharmacy Med Name: Vitamin D3 1.25 MG (50000 UT) Oral Capsule  Has the patient contacted their pharmacy? Yes.   Contact PCP  Preferred Pharmacy (with phone number or street name):  [facility] [place] - [address] Phone:  [phone]  Fax:  [phone]      Has the patient been seen for an appointment in the last year OR does the patient have an upcoming appointment? Yes.    Agent: Please be advised that RX refills may take up to 3 business days. We ask that you follow-up with your pharmacy.

## 2022-05-13 NOTE — Telephone Encounter (Signed)
Requested medication (s) are due for refill today - provider review   Requested medication (s) are on the active medication list -yes  Future visit scheduled -yes  Last refill: 05/15/21 #12 2RF  Notes to clinic: high dose Rx- provider review   Requested Prescriptions  Pending Prescriptions Disp Refills   Cholecalciferol (VITAMIN D3) 1.25 MG (50000 UT) CAPS [Pharmacy Med Name: Vitamin D3 1.25 MG (50000 UT) Oral Capsule] 12 capsule 0    Sig: Take 1 capsule by mouth once a week     Endocrinology:  Vitamins - Vitamin D Supplementation 2 Failed - 05/12/2022  1:57 PM      Failed - Manual Review: Route requests for 50,000 IU strength to the provider      Passed - Ca in normal range and within 360 days    Calcium  Date Value Ref Range Status  03/17/2022 9.2 8.7 - 10.3 mg/dL Final   Calcium, Total  Date Value Ref Range Status  02/14/2014 8.7 8.5 - 10.1 mg/dL Final         Passed - Vitamin D in normal range and within 360 days    Vit D, 25-Hydroxy  Date Value Ref Range Status  03/17/2022 68.7 30.0 - 100.0 ng/mL Final    Comment:    Vitamin D deficiency has been defined by the Institute of Medicine and an Endocrine Society practice guideline as a level of serum 25-OH vitamin D less than 20 ng/mL (1,2). The Endocrine Society went on to further define vitamin D insufficiency as a level between 21 and 29 ng/mL (2). 1. IOM (Institute of Medicine). 2010. Dietary reference    intakes for calcium and D. [place]: The    [employer]. 2. Holick MF, Binkley [place], Bischoff-Ferrari HA, et al.    Evaluation, treatment, and prevention of vitamin D    deficiency: an Endocrine Society clinical practice    guideline. JCEM. 2011 Jul; 96(7):1911-30.          Passed - Valid encounter within last 12 months    Recent Outpatient Visits           1 month ago Asthmatic bronchitis , chronic (HCC)   [facility] [place], [place] T, NP   5 months ago Asthmatic bronchitis ,  chronic (HCC)   [facility] [place], [place] T, NP   6 months ago Asthmatic bronchitis , chronic (HCC)   [facility] [place], Jolene T, NP   8 months ago Asthmatic bronchitis , chronic (HCC)   [facility] Cannady, Jolene T, NP   10 months ago COPD exacerbation (HCC)   [facility] [place], Dorie Rank[place]olene T, NP       Future Appointments             In 4 months [facility]ady[facility] Rank[facility]olene T[facility]             Signed Prescriptions Disp Refills   fluticasone-salmeterol (ADVAIR) 250-50 MCG/ACT AEPB 60 each 0    Sig: INHALE 1 DOSE BY MOUTH IN THE MORNING AND AT BEDTIME     Pulmonology:  Combination Products Passed - 05/12/2022  1:57 PM      Passed - Valid encounter within last 12 months    Recent Outpatient Visits           1 month ago Asthmatic bronchitis , chronic (HCC)   [facility] [place], Jolene T, NP   5 months ago Asthmatic bronchitis , chronic (HCC)  [facility] [place], [place]ing[place] T, NP   6 months ago Asthmatic bronchitis , chronic (HCC)   [facility] [place], Terrell T, NP   8 months ago Asthmatic bronchitis , chronic (HCC)   [facility] Cannady, Jolene T, NP   10 months ago COPD exacerbation (HCC)   [facility] [place], Corrie Dandy T, NP       Future Appointments             In 4 months Cannady, Dorie Rank, NP [facility], [facility]              fluticasone (FLONASE) 50 MCG/ACT nasal spray 16 g 0    Sig: Use 2 spray(s) in each nostril once daily     Ear, Nose, and Throat: Nasal Preparations - Corticosteroids Passed - 05/12/2022  1:57 PM      Passed - Valid encounter within last 12 months    Recent Outpatient Visits           1 month ago Asthmatic bronchitis , chronic (HCC)   [facility] [place], Jolene T, NP   5 months ago Asthmatic bronchitis , chronic (HCC)   [facility] [place], Jolene T, NP   6  months ago Asthmatic bronchitis , chronic (HCC)   [facility] Cannady, Jolene T, NP   8 months ago Asthmatic bronchitis , chronic (HCC)   [facility] Cannady, Jolene T, NP   10 months ago COPD exacerbation (HCC)   [facility] [place], [place] T, NP       Future Appointments             In 4 months Cannady, Dorie Rank, NP [facility], [facility]                Requested Prescriptions  Pending Prescriptions Disp Refills   Cholecalciferol (VITAMIN D3) 1.25 MG (50000 UT) CAPS [Pharmacy Med Name: Vitamin D3 1.25 MG (50000 UT) Oral Capsule] 12 capsule 0    Sig: Take 1 capsule by mouth once a week     Endocrinology:  Vitamins - Vitamin D Supplementation 2 Failed - 05/12/2022  1:57 PM      Failed - Manual Review: Route requests for 50,000 IU strength to the provider      Passed - Ca in normal range and within 360 days    Calcium  Date Value Ref Range Status  03/17/2022 9.2 8.7 - 10.3 mg/dL Final   Calcium, Total  Date Value Ref Range Status  02/14/2014 8.7 8.5 - 10.1 mg/dL Final         Passed - Vitamin D in normal range and within 360 days    Vit D, 25-Hydroxy  Date Value Ref Range Status  03/17/2022 68.7 30.0 - 100.0 ng/mL Final    Comment:    Vitamin D deficiency has been defined by the Institute of Medicine and an Endocrine Society practice guideline as a level of serum 25-OH vitamin D less than 20 ng/mL (1,2). The Endocrine Society went on to further define vitamin D insufficiency as a level between 21 and 29 ng/mL (2). 1. IOM (Institute of Medicine). 2010. Dietary reference    intakes for calcium and D. [place]: The    [employer]. 2. Holick MF, Binkley [place], Bischoff-Ferrari HA, et al.    Evaluation, treatment, and prevention of vitamin D    deficiency: an Endocrine Society clinical practice    guideline. JCEM. 2011 Jul; 96(7):1911-30.  Passed - Valid encounter within last 12 months     Recent Outpatient Visits           1 month ago Asthmatic bronchitis , chronic (HCC)   [facility] [place], [place] T, NP   5 months ago Asthmatic bronchitis , chronic (HCC)   [facility] [place], Bernard T, NP   6 months ago Asthmatic bronchitis , chronic (HCC)   [facility] Cannady, Jolene T, NP   8 months ago Asthmatic bronchitis , chronic (HCC)   [facility] Cannady, Jolene T, NP   10 months ago COPD exacerbation (HCC)   [facility] [place], Dorie Rank, NP       Future Appointments             In 4 months Cannady, Bernard T, NP [facility], [facility]             Signed Prescriptions Disp Refills   fluticasone-salmeterol (ADVAIR) 250-50 MCG/ACT AEPB 60 each 0    Sig: INHALE 1 DOSE BY MOUTH IN THE MORNING AND AT BEDTIME     Pulmonology:  Combination Products Passed - 05/12/2022  1:57 PM      Passed - Valid encounter within last 12 months    Recent Outpatient Visits           1 month ago Asthmatic bronchitis , chronic (HCC)   [facility] [place], Jolene T, NP   5 months ago Asthmatic bronchitis , chronic (HCC)   [facility] [place], [place] T, NP   6 months ago Asthmatic bronchitis , chronic (HCC)   [facility] [place], Jolene T, NP   8 months ago Asthmatic bronchitis , chronic (HCC)   [facility] Cannady, Jolene T, NP   10 months ago COPD exacerbation (HCC)   [facility] [place], [place] T, NP       Future Appointments             In 4 months Cannady, [place] T, NP [facility], [facility]              fluticasone (FLONASE) 50 MCG/ACT nasal spray 16 g 0    Sig: Use 2 spray(s) in each nostril once daily     Ear, Nose, and Throat: Nasal Preparations - Corticosteroids Passed - 05/12/2022  1:57 PM      Passed - Valid encounter within last 12 months    Recent Outpatient Visits           1 month ago Asthmatic bronchitis ,  chronic (HCC)   [facility] Cannady, Jolene T, NP   5 months ago Asthmatic bronchitis , chronic (HCC)   [facility] [place], [place] T, NP   6 months ago Asthmatic bronchitis , chronic (HCC)   [facility] Cannady, Jolene T, NP   8 months ago Asthmatic bronchitis , chronic (HCC)   [facility] Cannady, Jolene T, NP   10 months ago COPD exacerbation (HCC)   [facility] [place], Dorie Rank, NP       Future Appointments             In 4 months Cannady, Dorie Rank, NP [facility]

## 2022-05-13 NOTE — Telephone Encounter (Signed)
Requested Prescriptions  Pending Prescriptions Disp Refills  . fluticasone-salmeterol (ADVAIR) 250-50 MCG/ACT AEPB [Pharmacy Med Name: Fluticasone-Salmeterol 250-50 MCG/DOSE Inhalation Aerosol Powder Breath Activated] 60 each 0    Sig: INHALE 1 DOSE BY MOUTH IN THE MORNING AND AT BEDTIME     Pulmonology:  Combination Products Passed - 05/12/2022  1:57 PM      Passed - Valid encounter within last 12 months    Recent Outpatient Visits          1 month ago Asthmatic bronchitis , chronic (HCC)   [facility] [place], Jolene T, NP   5 months ago Asthmatic bronchitis , chronic (HCC)   [facility] [place], [place] T, NP   6 months ago Asthmatic bronchitis , chronic (HCC)   [facility] Cannady, Jolene T, NP   8 months ago Asthmatic bronchitis , chronic (HCC)   [facility] Cannady, Jolene T, NP   10 months ago COPD exacerbation (HCC)   [facility] [place], Dorie Rank, NP      Future Appointments            In 4 months Cannady, Dorie Rank, NP [facility]           . Cholecalciferol (VITAMIN D3) 1.25 MG (50000 UT) CAPS [Pharmacy Med Name: Vitamin D3 1.25 MG (50000 UT) Oral Capsule] 12 capsule 0    Sig: Take 1 capsule by mouth once a week     Endocrinology:  Vitamins - Vitamin D Supplementation 2 Failed - 05/12/2022  1:57 PM      Failed - Manual Review: Route requests for 50,000 IU strength to the provider      Passed - Ca in normal range and within 360 days    Calcium  Date Value Ref Range Status  03/17/2022 9.2 8.7 - 10.3 mg/dL Final   Calcium, Total  Date Value Ref Range Status  02/14/2014 8.7 8.5 - 10.1 mg/dL Final         Passed - Vitamin D in normal range and within 360 days    Vit D, 25-Hydroxy  Date Value Ref Range Status  03/17/2022 68.7 30.0 - 100.0 ng/mL Final    Comment:    Vitamin D deficiency has been defined by the Institute of Medicine and an Endocrine Society practice guideline as  a level of serum 25-OH vitamin D less than 20 ng/mL (1,2). The Endocrine Society went on to further define vitamin D insufficiency as a level between 21 and 29 ng/mL (2). 1. IOM (Institute of Medicine). 2010. Dietary reference    intakes for calcium and D. [place]: The    [employer]. 2. Holick MF, [place], Bischoff-Ferrari HA, et al.    Evaluation, treatment, and prevention of vitamin D    deficiency: an Endocrine Society clinical practice    guideline. JCEM. 2011 Jul; 96(7):1911-30.          Passed - Valid encounter within last 12 months    Recent Outpatient Visits          1 month ago Asthmatic bronchitis , chronic (HCC)   [facility] [place], [place] T, NP   5 months ago Asthmatic bronchitis , chronic (HCC)   [facility] [place], [place] T, NP   6 months ago Asthmatic bronchitis , chronic (HCC)   [facility] [place], Jolene T, NP   8 months ago Asthmatic bronchitis , chronic (HCC)   [facility] [place], [place] T,  NP   10 months ago COPD exacerbation (HCC)   [facility] [place], Dorie Rank, NP      Future Appointments            In 4 months Cannady, Dorie Rank, NP [facility]           . fluticasone (FLONASE) 50 MCG/ACT nasal spray [Pharmacy Med Name: Fluticasone Propionate 50 MCG/ACT Nasal Suspension] 16 g 0    Sig: Use 2 spray(s) in each nostril once daily     Ear, Nose, and Throat: Nasal Preparations - Corticosteroids Passed - 05/12/2022  1:57 PM      Passed - Valid encounter within last 12 months    Recent Outpatient Visits          1 month ago Asthmatic bronchitis , chronic (HCC)   [facility] [place], Jolene T, NP   5 months ago Asthmatic bronchitis , chronic (HCC)   [facility] [place], [place] T, NP   6 months ago Asthmatic bronchitis , chronic (HCC)   [facility] Cannady, Jolene T, NP   8 months ago Asthmatic bronchitis  , chronic (HCC)   [facility] Cannady, Jolene T, NP   10 months ago COPD exacerbation (HCC)   [facility] [place], Dorie Rank, NP      Future Appointments            In 4 months Cannady, Dorie Rank, NP [facility]

## 2022-05-13 NOTE — Telephone Encounter (Signed)
Duplicate request- already forwarded to provider or filled Requested Prescriptions  Pending Prescriptions Disp Refills  . fluticasone (FLONASE) 50 MCG/ACT nasal spray 16 g 1    Sig: PLACE 2 SPRAYS INTO BOTH NOSTRILS EVERY DAY     Ear, Nose, and Throat: Nasal Preparations - Corticosteroids Passed - 05/13/2022  8:43 AM      Passed - Valid encounter within last 12 months    Recent Outpatient Visits          1 month ago Asthmatic bronchitis , chronic (HCC)   [facility] [place], Jolene T, NP   5 months ago Asthmatic bronchitis , chronic (HCC)   [facility] [place], [place] T, NP   6 months ago Asthmatic bronchitis , chronic (HCC)   [facility] Cannady, Jolene T, NP   8 months ago Asthmatic bronchitis , chronic (HCC)   [facility] Cannady, Jolene T, NP   10 months ago COPD exacerbation (HCC)   [facility] [place], Dorie Rank, NP      Future Appointments            In 4 months Cannady, Dorie Rank, NP [facility]           . Cholecalciferol 1.25 MG (50000 UT) TABS 12 tablet 2    Sig: Take 1 tablet by mouth once a week.     Endocrinology:  Vitamins - Vitamin D Supplementation 2 Failed - 05/13/2022  8:43 AM      Failed - Manual Review: Route requests for 50,000 IU strength to the provider      Passed - Ca in normal range and within 360 days    Calcium  Date Value Ref Range Status  03/17/2022 9.2 8.7 - 10.3 mg/dL Final   Calcium, Total  Date Value Ref Range Status  02/14/2014 8.7 8.5 - 10.1 mg/dL Final         Passed - Vitamin D in normal range and within 360 days    Vit D, 25-Hydroxy  Date Value Ref Range Status  03/17/2022 68.7 30.0 - 100.0 ng/mL Final    Comment:    Vitamin D deficiency has been defined by the Institute of Medicine and an Endocrine Society practice guideline as a level of serum 25-OH vitamin D less than 20 ng/mL (1,2). The Endocrine Society went on to further define vitamin  D insufficiency as a level between 21 and 29 ng/mL (2). 1. IOM (Institute of Medicine). 2010. Dietary reference    intakes for calcium and D. [place]: The    [employer]. 2. Holick MF, [place]ley [place], Bischoff-Ferrari HA, et al.    Evaluation, treatment, and prevention of vitamin D    deficiency: an Endocrine Society clinical practice    guideline. JCEM. 2011 Jul; 96(7):1911-30.          Passed - Valid encounter within last 12 months    Recent Outpatient Visits          1 month ago Asthmatic bronchitis , chronic (HCC)   [facility] [place], [place] T, NP   5 months ago Asthmatic bronchitis , chronic (HCC)   [facility] [place], [place] T, NP   6 months ago Asthmatic bronchitis , chronic (HCC)   [facility] [place], Jolene T, NP   8 months ago Asthmatic bronchitis , chronic (HCC)   [facility] [place], Jolene T, NP   10 months ago COPD exacerbation (HCC)   [facility]  Marjie Skiff, NP      Future Appointments            In 4 months Cannady, Dorie Rank, NP [facility]

## 2022-05-23 ENCOUNTER — Other Ambulatory Visit: Payer: Self-pay | Admitting: Nurse Practitioner

## 2022-05-26 NOTE — Telephone Encounter (Signed)
Requested medication (s) are due for refill today:   Not sure  Provider to review  Requested medication (s) are on the active medication list:   Yes  Future visit scheduled:   Yes   Last ordered: 05/12/2021 #12, 2 refills  Returned because this is a non delegated refill.   Duplicate request submitted by pharmacy   Requested Prescriptions  Pending Prescriptions Disp Refills   Cholecalciferol (VITAMIN D3) 1.25 MG (50000 UT) CAPS [Pharmacy Med Name: Vitamin D3 1.25 MG (50000 UT) Oral Capsule] 12 capsule 0    Sig: Take 1 capsule by mouth once a week     Endocrinology:  Vitamins - Vitamin D Supplementation 2 Failed - 05/23/2022  5:08 PM      Failed - Manual Review: Route requests for 50,000 IU strength to the provider      Passed - Ca in normal range and within 360 days    Calcium  Date Value Ref Range Status  03/17/2022 9.2 8.7 - 10.3 mg/dL Final   Calcium, Total  Date Value Ref Range Status  02/14/2014 8.7 8.5 - 10.1 mg/dL Final         Passed - Vitamin D in normal range and within 360 days    Vit D, 25-Hydroxy  Date Value Ref Range Status  03/17/2022 68.7 30.0 - 100.0 ng/mL Final    Comment:    Vitamin D deficiency has been defined by the Institute of Medicine and an Endocrine Society practice guideline as a level of serum 25-OH vitamin D less than 20 ng/mL (1,2). The Endocrine Society went on to further define vitamin D insufficiency as a level between 21 and 29 ng/mL (2). 1. IOM (Institute of Medicine). 2010. Dietary reference    intakes for calcium and D. [place]: The    [employer]. 2. Holick MF, [place], Bischoff-Ferrari HA, et al.    Evaluation, treatment, and prevention of vitamin D    deficiency: an Endocrine Society clinical practice    guideline. JCEM. 2011 Jul; 96(7):1911-30.          Passed - Valid encounter within last 12 months    Recent Outpatient Visits           2 months ago Asthmatic bronchitis , chronic (HCC)   [facility] [place], Alianza T, NP   5 months ago Asthmatic bronchitis , chronic (HCC)   [facility] [place], Jolene T, NP   7 months ago Asthmatic bronchitis , chronic (HCC)   [facility] Cannady, Jolene T, NP   9 months ago Asthmatic bronchitis , chronic (HCC)   [facility] Cannady, Jolene T, NP   10 months ago COPD exacerbation (HCC)   [facility] [place], Dorie Rank, NP       Future Appointments             In 3 months Cannady, Dorie Rank, NP [facility]

## 2022-06-04 ENCOUNTER — Telehealth: Payer: BC Managed Care – PPO | Admitting: Physician Assistant

## 2022-06-04 DIAGNOSIS — J019 Acute sinusitis, unspecified: Secondary | ICD-10-CM | POA: Diagnosis not present

## 2022-06-04 DIAGNOSIS — H66002 Acute suppurative otitis media without spontaneous rupture of ear drum, left ear: Secondary | ICD-10-CM | POA: Diagnosis not present

## 2022-06-04 DIAGNOSIS — B9689 Other specified bacterial agents as the cause of diseases classified elsewhere: Secondary | ICD-10-CM

## 2022-06-04 MED ORDER — PROMETHAZINE-DM 6.25-15 MG/5ML PO SYRP: 5.0000 mL | ORAL_SOLUTION | Freq: Four times a day (QID) | ORAL | 0 refills | Status: DC | PRN

## 2022-06-04 MED ORDER — AMOXICILLIN-POT CLAVULANATE 875-125 MG PO TABS: 1.0000 | ORAL_TABLET | Freq: Two times a day (BID) | ORAL | 0 refills | Status: DC

## 2022-06-04 MED ORDER — BENZONATATE 100 MG PO CAPS: 100.0000 mg | ORAL_CAPSULE | Freq: Three times a day (TID) | ORAL | 0 refills | Status: DC | PRN

## 2022-06-04 NOTE — Patient Instructions (Signed)
Audrey Richard, thank you for joining [name], PA-C for today's virtual visit.  While this provider is not your primary care provider (PCP), if your PCP is located in our provider database this encounter information will be shared with them immediately following your visit.  Consent: (Patient) Audrey Richard provided verbal consent for this virtual visit at the beginning of the encounter.  Current Medications:  Current Outpatient Medications:    amoxicillin-clavulanate (AUGMENTIN) 875-125 MG tablet, Take 1 tablet by mouth 2 (two) times daily., Disp: 20 tablet, Rfl: 0   benzonatate (TESSALON) 100 MG capsule, Take 1 capsule (100 mg total) by mouth 3 (three) times daily as needed., Disp: 30 capsule, Rfl: 0   Cholecalciferol (VITAMIN D3) 1.25 MG (50000 UT) CAPS, Take 1 capsule by mouth once a week, Disp: 12 capsule, Rfl: 4   promethazine-dextromethorphan (PROMETHAZINE-DM) 6.25-15 MG/5ML syrup, Take 5 mLs by mouth 4 (four) times daily as needed., Disp: 118 mL, Rfl: 0   albuterol (VENTOLIN HFA) 108 (90 Base) MCG/ACT inhaler, Inhale 2 puffs into the lungs every 6 (six) hours as needed for wheezing or shortness of breath., Disp: 18 g, Rfl: 4   buPROPion (WELLBUTRIN XL) 150 MG 24 hr tablet, TAKE ONE TABLET BY MOUTH EVERY DAY, Disp: 90 tablet, Rfl: 4   cetirizine (ZYRTEC) 10 MG tablet, TAKE ONE TABLET BY MOUTH EVERY DAY, Disp: 90 tablet, Rfl: 4   cyclobenzaprine (FLEXERIL) 10 MG tablet, Take 1 tablet (10 mg total) by mouth at bedtime., Disp: 90 tablet, Rfl: 3   famotidine (PEPCID) 20 MG tablet, One after supper, Disp: 30 tablet, Rfl: 11   FLUoxetine (PROZAC) 20 MG tablet, Take 1 tablet (20 mg total) by mouth daily., Disp: 90 tablet, Rfl: 4   fluticasone (FLONASE) 50 MCG/ACT nasal spray, Use 2 spray(s) in each nostril once daily, Disp: 16 g, Rfl: 0   fluticasone-salmeterol (ADVAIR) 250-50 MCG/ACT AEPB, INHALE 1 DOSE BY MOUTH IN THE MORNING AND AT BEDTIME, Disp: 60 each, Rfl: 0   gabapentin  (NEURONTIN) 300 MG capsule, Take one capsule (300 MG) by mouth in the morning and at noon, then before bedtime take 2 capsules (600 MG) by mouth., Disp: 360 capsule, Rfl: 4   ipratropium-albuterol (DUONEB) 0.5-2.5 (3) MG/3ML SOLN, Take 3 mLs by nebulization every 6 (six) hours as needed., Disp: 360 mL, Rfl: 2   levothyroxine (SYNTHROID) 125 MCG tablet, Take 1 tablet (125 mcg total) by mouth daily., Disp: 90 tablet, Rfl: 3   montelukast (SINGULAIR) 10 MG tablet, Take 1 tablet (10 mg total) by mouth at bedtime., Disp: 90 tablet, Rfl: 4   mupirocin ointment (BACTROBAN) 2 %, Apply 1 application. topically 2 (two) times daily., Disp: 22 g, Rfl: 3   olopatadine (PATADAY) 0.1 % ophthalmic solution, Place 1 drop into both eyes 2 (two) times daily., Disp: 5 mL, Rfl: 12   omeprazole (PRILOSEC) 40 MG capsule, TAKE ONE CAPSULE BY MOUTH EVERY EVENING, Disp: , Rfl:    rosuvastatin (CRESTOR) 20 MG tablet, TAKE ONE TABLET BY MOUTH EVERY DAY, Disp: 90 tablet, Rfl: 4   traZODone (DESYREL) 50 MG tablet, Take 1 tablet (50 mg total) by mouth at bedtime as needed., Disp: 30 tablet, Rfl: 5   triamcinolone ointment (KENALOG) 0.5 %, Apply 1 application. topically 2 (two) times daily., Disp: 30 g, Rfl: 0   vitamin B-12 (CYANOCOBALAMIN) 500 MCG tablet, Take 1 tablet (500 mcg total) by mouth daily., Disp: 90 tablet, Rfl: 4   Medications ordered in this encounter:  Meds ordered this encounter  Medications   amoxicillin-clavulanate (AUGMENTIN) 875-125 MG tablet    Sig: Take 1 tablet by mouth 2 (two) times daily.    Dispense:  20 tablet    Refill:  0    Order Specific Question:   Supervising Provider    Answer:   MILLER, BRIAN [[id number]]   benzonatate (TESSALON) 100 MG capsule    Sig: Take 1 capsule (100 mg total) by mouth 3 (three) times daily as needed.    Dispense:  30 capsule    Refill:  0    Order Specific Question:   Supervising Provider    Answer:   Eber Hong [[id number]]   promethazine-dextromethorphan  (PROMETHAZINE-DM) 6.25-15 MG/5ML syrup    Sig: Take 5 mLs by mouth 4 (four) times daily as needed.    Dispense:  118 mL    Refill:  0    Order Specific Question:   Supervising Provider    Answer:   Hyacinth Meeker, BRIAN [[id number]]     *If you need refills on other medications prior to your next appointment, please contact your pharmacy*  Follow-Up: Call back or seek an in-person evaluation if the symptoms worsen or if the condition fails to improve as anticipated.  Other Instructions Otitis Media, Adult  Otitis media is a condition in which the middle ear is red and swollen (inflamed) and full of fluid. The middle ear is the part of the ear that contains bones for hearing as well as air that helps send sounds to the brain. The condition usually goes away on its own. What are the causes? This condition is caused by a blockage in the eustachian tube. This tube connects the middle ear to the back of the nose. It normally allows air into the middle ear. The blockage is caused by fluid or swelling. Problems that can cause blockage include: A cold or infection that affects the nose, mouth, or throat. Allergies. An irritant, such as tobacco smoke. Adenoids that have become large. The adenoids are soft tissue located in the back of the throat, behind the nose and the roof of the mouth. Growth or swelling in the upper part of the throat, just behind the nose (nasopharynx). Damage to the ear caused by a change in pressure. This is called barotrauma. What increases the risk? You are more likely to develop this condition if you: Smoke or are exposed to tobacco smoke. Have an opening in the roof of your mouth (cleft palate). Have acid reflux. Have problems in your body's defense system (immune system). What are the signs or symptoms? Symptoms of this condition include: Ear pain. Fever. Problems with hearing. Being tired. Fluid leaking from the ear. Ringing in the ear. How is this treated? This  condition can go away on its own within 3-5 days. But if the condition is caused by germs (bacteria) and does not go away on its own, or if it keeps coming back, your doctor may: Give you antibiotic medicines. Give you medicines for pain. Follow these instructions at home: Take over-the-counter and prescription medicines only as told by your doctor. If you were prescribed an antibiotic medicine, take it as told by your doctor. Do not stop taking it even if you start to feel better. Keep all follow-up visits. Contact a doctor if: You have bleeding from your nose. There is a lump on your neck. You are not feeling better in 5 days. You feel worse instead of better. Get help right away if: You have  pain that is not helped with medicine. You have swelling, redness, or pain around your ear. You get a stiff neck. You cannot move part of your face (paralysis). You notice that the bone behind your ear hurts when you touch it. You get a very bad headache. Summary Otitis media means that the middle ear is red, swollen, and full of fluid. This condition usually goes away on its own. If the problem does not go away, treatment may be needed. You may be given medicines to treat the infection or to treat your pain. If you were prescribed an antibiotic medicine, take it as told by your doctor. Do not stop taking it even if you start to feel better. Keep all follow-up visits. This information is not intended to replace advice given to you by your health care provider. Make sure you discuss any questions you have with your health care provider. Document Revised: 03/18/2021 Document Reviewed: 03/18/2021 Elsevier Patient Education  2023 Elsevier Inc.   Sinus Infection, Adult A sinus infection is soreness and swelling (inflammation) of your sinuses. Sinuses are hollow spaces in the bones around your face. They are located: Around your eyes. In the middle of your forehead. Behind your nose. In your  cheekbones. Your sinuses and nasal passages are lined with a fluid called mucus. Mucus drains out of your sinuses. Swelling can trap mucus in your sinuses. This lets germs (bacteria, virus, or fungus) grow, which leads to infection. Most of the time, this condition is caused by a virus. What are the causes? Allergies. Asthma. Germs. Things that block your nose or sinuses. Growths in the nose (nasal polyps). Chemicals or irritants in the air. A fungus. This is rare. What increases the risk? Having a weak body defense system (immune system). Doing a lot of swimming or diving. Using nasal sprays too much. Smoking. What are the signs or symptoms? The main symptoms of this condition are pain and a feeling of pressure around the sinuses. Other symptoms include: Stuffy nose (congestion). This may make it hard to breathe through your nose. Runny nose (drainage). Soreness, swelling, and warmth in the sinuses. A cough that may get worse at night. Being unable to smell and taste. Mucus that collects in the throat or the back of the nose (postnasal drip). This may cause a sore throat or bad breath. Being very tired (fatigued). A fever. How is this diagnosed? Your symptoms. Your medical history. A physical exam. Tests to find out if your condition is short-term (acute) or long-term (chronic). Your doctor may: Check your nose for growths (polyps). Check your sinuses using a tool that has a light on one end (endoscope). Check for allergies or germs. Do imaging tests, such as an MRI or CT scan. How is this treated? Treatment for this condition depends on the cause and whether it is short-term or long-term. If caused by a virus, your symptoms should go away on their own within 10 days. You may be given medicines to relieve symptoms. They include: Medicines that shrink swollen tissue in the nose. A spray that treats swelling of the nostrils. Rinses that help get rid of thick mucus in your nose  (nasal saline washes). Medicines that treat allergies (antihistamines). Over-the-counter pain relievers. If caused by bacteria, your doctor may wait to see if you will get better without treatment. You may be given antibiotic medicine if you have: A very bad infection. A weak body defense system. If caused by growths in the nose, surgery may be  needed. Follow these instructions at home: Medicines Take, use, or apply over-the-counter and prescription medicines only as told by your doctor. These may include nasal sprays. If you were prescribed an antibiotic medicine, take it as told by your doctor. Do not stop taking it even if you start to feel better. Hydrate and humidify  Drink enough water to keep your pee (urine) pale yellow. Use a cool mist humidifier to keep the humidity level in your home above 50%. Breathe in steam for 10-15 minutes, 3-4 times a day, or as told by your doctor. You can do this in the bathroom while a hot shower is running. Try not to spend time in cool or dry air. Rest Rest as much as you can. Sleep with your head raised (elevated). Make sure you get enough sleep each night. General instructions  Put a warm, moist washcloth on your face 3-4 times a day, or as often as told by your doctor. Use nasal saline washes as often as told by your doctor. Wash your hands often with soap and water. If you cannot use soap and water, use hand sanitizer. Do not smoke. Avoid being around people who are smoking (secondhand smoke). Keep all follow-up visits. Contact a doctor if: You have a fever. Your symptoms get worse. Your symptoms do not get better within 10 days. Get help right away if: You have a very bad headache. You cannot stop vomiting. You have very bad pain or swelling around your face or eyes. You have trouble seeing. You feel confused. Your neck is stiff. You have trouble breathing. These symptoms may be an emergency. Get help right away. Call 911. Do not  wait to see if the symptoms will go away. Do not drive yourself to the hospital. Summary A sinus infection is swelling of your sinuses. Sinuses are hollow spaces in the bones around your face. This condition is caused by tissues in your nose that become inflamed or swollen. This traps germs. These can lead to infection. If you were prescribed an antibiotic medicine, take it as told by your doctor. Do not stop taking it even if you start to feel better. Keep all follow-up visits. This information is not intended to replace advice given to you by your health care provider. Make sure you discuss any questions you have with your health care provider. Document Revised: 11/12/2021 Document Reviewed: 11/12/2021 Elsevier Patient Education  2023 Elsevier Inc.    If you have been instructed to have an in-person evaluation today at a local Urgent Care facility, please use the link below. It will take you to a list of all of our available [facility], including address, phone number and hours of operation. Please do not delay care.  [facility]  If you or a family member do not have a primary care provider, use the link below to schedule a visit and establish care. When you choose a [facility] primary care physician or advanced practice provider, you gain a long-term partner in health. Find a Primary Care Provider  Learn more about [facility]'s in-office and virtual care options: [facility] - Get Care Now

## 2022-06-04 NOTE — Progress Notes (Signed)
Virtual Visit Consent   Audrey Richard, you are scheduled for a virtual visit with a [facility] provider today. Just as with appointments in the office, your consent must be obtained to participate. Your consent will be active for this visit and any virtual visit you may have with one of our providers in the next 365 days. If you have a MyChart account, a copy of this consent can be sent to you electronically.  As this is a virtual visit, video technology does not allow for your provider to perform a traditional examination. This may limit your provider's ability to fully assess your condition. If your provider identifies any concerns that need to be evaluated in person or the need to arrange testing (such as labs, EKG, etc.), we will make arrangements to do so. Although advances in technology are sophisticated, we cannot ensure that it will always work on either your end or our end. If the connection with a video visit is poor, the visit may have to be switched to a telephone visit. With either a video or telephone visit, we are not always able to ensure that we have a secure connection.  By engaging in this virtual visit, you consent to the provision of healthcare and authorize for your insurance to be billed (if applicable) for the services provided during this visit. Depending on your insurance coverage, you may receive a charge related to this service.  I need to obtain your verbal consent now. Are you willing to proceed with your visit today? HOLLYN FRENZ has provided verbal consent on 06/04/2022 for a virtual visit (video or telephone). Mar Daring, PA-C  Date: 06/04/2022 11:37 AM  Virtual Visit via Video Note   I, Mar Daring, connected with  Audrey Richard  ([id number], 10/26/1960) on 06/04/22 at 11:30 AM EDT by a video-enabled telemedicine application and verified that I am speaking with the correct person using two identifiers.  Location: Patient: Virtual Visit  Location Patient: Home Provider: Virtual Visit Location Provider: Home Office   I discussed the limitations of evaluation and management by telemedicine and the availability of in person appointments. The patient expressed understanding and agreed to proceed.    History of Present Illness: Audrey Richard is a 61 y.o. who identifies as a female who was assigned female at birth, and is being seen today for left ear pain and sinus congestion.  HPI: Sinusitis This is a new problem. The current episode started in the past 7 days (worsened yesterday). The problem has been gradually worsening since onset. There has been no fever. The pain is moderate. Associated symptoms include congestion, coughing, ear pain (left; reports purulent drainage from that ear), headaches, a hoarse voice, sinus pressure and a sore throat. Pertinent negatives include no chills, shortness of breath, sneezing or swollen glands. Past treatments include nothing. The treatment provided no relief.      Problems:  Patient Active Problem List   Diagnosis Date Noted   Paronychia of finger, left 03/17/2022   Obesity 12/11/2021   Upper airway cough syndrome 08/06/2021   Aortic atherosclerosis ([place]) 07/13/2021   B12 deficiency 06/18/2021   Vitamin D deficiency 06/15/2021   Elevated hemoglobin A1c 05/14/2021   CKD (chronic kidney disease) stage 3, GFR 30-59 ml/min (HCC) 04/12/2021   Chronic back pain 01/10/2020   Acid reflux 09/29/2019   Primary generalized (osteo)arthritis 09/29/2019   Health care maintenance 09/29/2019   Asthmatic bronchitis , chronic ([place]) 11/15/2018   Allergic rhinitis 12/08/2017  Osteopenia 06/11/2015   Depression 06/11/2015   Insomnia 06/11/2015   Hashimoto's thyroiditis 06/11/2015   Mixed hyperlipidemia 06/11/2015    Allergies:  Allergies  Allergen Reactions   Biaxin [Clarithromycin]     Patient reports itching, nausea   Iodine     Iodine blisters skin   Medications:  Current Outpatient  Medications:    amoxicillin-clavulanate (AUGMENTIN) 875-125 MG tablet, Take 1 tablet by mouth 2 (two) times daily., Disp: 20 tablet, Rfl: 0   benzonatate (TESSALON) 100 MG capsule, Take 1 capsule (100 mg total) by mouth 3 (three) times daily as needed., Disp: 30 capsule, Rfl: 0   Cholecalciferol (VITAMIN D3) 1.25 MG (50000 UT) CAPS, Take 1 capsule by mouth once a week, Disp: 12 capsule, Rfl: 4   promethazine-dextromethorphan (PROMETHAZINE-DM) 6.25-15 MG/5ML syrup, Take 5 mLs by mouth 4 (four) times daily as needed., Disp: 118 mL, Rfl: 0   albuterol (VENTOLIN HFA) 108 (90 Base) MCG/ACT inhaler, Inhale 2 puffs into the lungs every 6 (six) hours as needed for wheezing or shortness of breath., Disp: 18 g, Rfl: 4   buPROPion (WELLBUTRIN XL) 150 MG 24 hr tablet, TAKE ONE TABLET BY MOUTH EVERY DAY, Disp: 90 tablet, Rfl: 4   cetirizine (ZYRTEC) 10 MG tablet, TAKE ONE TABLET BY MOUTH EVERY DAY, Disp: 90 tablet, Rfl: 4   cyclobenzaprine (FLEXERIL) 10 MG tablet, Take 1 tablet (10 mg total) by mouth at bedtime., Disp: 90 tablet, Rfl: 3   famotidine (PEPCID) 20 MG tablet, One after supper, Disp: 30 tablet, Rfl: 11   FLUoxetine (PROZAC) 20 MG tablet, Take 1 tablet (20 mg total) by mouth daily., Disp: 90 tablet, Rfl: 4   fluticasone (FLONASE) 50 MCG/ACT nasal spray, Use 2 spray(s) in each nostril once daily, Disp: 16 g, Rfl: 0   fluticasone-salmeterol (ADVAIR) 250-50 MCG/ACT AEPB, INHALE 1 DOSE BY MOUTH IN THE MORNING AND AT BEDTIME, Disp: 60 each, Rfl: 0   gabapentin (NEURONTIN) 300 MG capsule, Take one capsule (300 MG) by mouth in the morning and at noon, then before bedtime take 2 capsules (600 MG) by mouth., Disp: 360 capsule, Rfl: 4   ipratropium-albuterol (DUONEB) 0.5-2.5 (3) MG/3ML SOLN, Take 3 mLs by nebulization every 6 (six) hours as needed., Disp: 360 mL, Rfl: 2   levothyroxine (SYNTHROID) 125 MCG tablet, Take 1 tablet (125 mcg total) by mouth daily., Disp: 90 tablet, Rfl: 3   montelukast (SINGULAIR) 10  MG tablet, Take 1 tablet (10 mg total) by mouth at bedtime., Disp: 90 tablet, Rfl: 4   mupirocin ointment (BACTROBAN) 2 %, Apply 1 application. topically 2 (two) times daily., Disp: 22 g, Rfl: 3   olopatadine (PATADAY) 0.1 % ophthalmic solution, Place 1 drop into both eyes 2 (two) times daily., Disp: 5 mL, Rfl: 12   omeprazole (PRILOSEC) 40 MG capsule, TAKE ONE CAPSULE BY MOUTH EVERY EVENING, Disp: , Rfl:    rosuvastatin (CRESTOR) 20 MG tablet, TAKE ONE TABLET BY MOUTH EVERY DAY, Disp: 90 tablet, Rfl: 4   traZODone (DESYREL) 50 MG tablet, Take 1 tablet (50 mg total) by mouth at bedtime as needed., Disp: 30 tablet, Rfl: 5   triamcinolone ointment (KENALOG) 0.5 %, Apply 1 application. topically 2 (two) times daily., Disp: 30 g, Rfl: 0   vitamin B-12 (CYANOCOBALAMIN) 500 MCG tablet, Take 1 tablet (500 mcg total) by mouth daily., Disp: 90 tablet, Rfl: 4  Observations/Objective: Patient is well-developed, well-nourished in no acute distress.  Resting comfortably at home.  Head is normocephalic, atraumatic.  No  labored breathing.  Speech is clear and coherent with logical content.  Patient is alert and oriented at baseline.  Frequent, hacking cough, not affecting speech  Assessment and Plan: 1. Non-recurrent acute suppurative otitis media of left ear without spontaneous rupture of tympanic membrane - amoxicillin-clavulanate (AUGMENTIN) 875-125 MG tablet; Take 1 tablet by mouth 2 (two) times daily.  Dispense: 20 tablet; Refill: 0 - benzonatate (TESSALON) 100 MG capsule; Take 1 capsule (100 mg total) by mouth 3 (three) times daily as needed.  Dispense: 30 capsule; Refill: 0 - promethazine-dextromethorphan (PROMETHAZINE-DM) 6.25-15 MG/5ML syrup; Take 5 mLs by mouth 4 (four) times daily as needed.  Dispense: 118 mL; Refill: 0  2. Acute bacterial sinusitis - amoxicillin-clavulanate (AUGMENTIN) 875-125 MG tablet; Take 1 tablet by mouth 2 (two) times daily.  Dispense: 20 tablet; Refill: 0 - benzonatate  (TESSALON) 100 MG capsule; Take 1 capsule (100 mg total) by mouth 3 (three) times daily as needed.  Dispense: 30 capsule; Refill: 0 - promethazine-dextromethorphan (PROMETHAZINE-DM) 6.25-15 MG/5ML syrup; Take 5 mLs by mouth 4 (four) times daily as needed.  Dispense: 118 mL; Refill: 0  - Worsening symptoms that have not responded to OTC medications.  - Will give Augmentin, Promethazine DM and tessalon perles - Continue allergy medications.  - Steam and humidifier can help - Stay well hydrated and get plenty of rest.  - Seek in person evaluation if no symptom improvement or if symptoms worsen   Follow Up Instructions: I discussed the assessment and treatment plan with the patient. The patient was provided an opportunity to ask questions and all were answered. The patient agreed with the plan and demonstrated an understanding of the instructions.  A copy of instructions were sent to the patient via MyChart unless otherwise noted below.    The patient was advised to call back or seek an in-person evaluation if the symptoms worsen or if the condition fails to improve as anticipated.  Time:  I spent 12 minutes with the patient via telehealth technology discussing the above problems/concerns.    Mar Daring, PA-C

## 2022-06-09 ENCOUNTER — Other Ambulatory Visit: Payer: BC Managed Care – PPO

## 2022-06-09 ENCOUNTER — Other Ambulatory Visit: Payer: Self-pay

## 2022-06-09 DIAGNOSIS — E063 Autoimmune thyroiditis: Secondary | ICD-10-CM

## 2022-06-10 ENCOUNTER — Other Ambulatory Visit: Payer: Self-pay | Admitting: Nurse Practitioner

## 2022-06-10 DIAGNOSIS — J309 Allergic rhinitis, unspecified: Secondary | ICD-10-CM

## 2022-06-10 LAB — TSH: TSH: 3.13 u[IU]/mL (ref 0.450–4.500)

## 2022-06-10 LAB — T4, FREE: Free T4: 1.56 ng/dL (ref 0.82–1.77)

## 2022-06-10 NOTE — Telephone Encounter (Signed)
Requested Prescriptions  Pending Prescriptions Disp Refills  . fluticasone (FLONASE) 50 MCG/ACT nasal spray [Pharmacy Med Name: Fluticasone Propionate 50 MCG/ACT Nasal Suspension] 16 g 0    Sig: Use 2 spray(s) in each nostril once daily     Ear, Nose, and Throat: Nasal Preparations - Corticosteroids Passed - 06/10/2022  9:26 AM      Passed - Valid encounter within last 12 months    Recent Outpatient Visits          2 months ago Asthmatic bronchitis , chronic (HCC)   [facility] [place], Jolene T, NP   6 months ago Asthmatic bronchitis , chronic (HCC)   [facility] [place], Jolene T, NP   7 months ago Asthmatic bronchitis , chronic (HCC)   [facility] [place], Jolene T, NP   9 months ago Asthmatic bronchitis , chronic (HCC)   [facility] [place], Jolene T, NP   10 months ago COPD exacerbation (HCC)   [facility] [place], Dorie Rank, NP      Future Appointments            In 3 months Cannady, Dorie Rank, NP [facility], [facility]

## 2022-06-10 NOTE — Progress Notes (Signed)
Contacted via MyChart   Good morning Audrey Richard, your thyroid testing has returned in normal ranges this check.  Please continue current dosing of Levothyroxine.  Any questions? Keep being amazing!!  Thank you for allowing me to participate in your care.  I appreciate you. Kindest regards, Wiletta [place]

## 2022-06-12 ENCOUNTER — Other Ambulatory Visit: Payer: Self-pay | Admitting: Nurse Practitioner

## 2022-06-12 DIAGNOSIS — J301 Allergic rhinitis due to pollen: Secondary | ICD-10-CM

## 2022-06-12 NOTE — Telephone Encounter (Signed)
Requested Prescriptions  Pending Prescriptions Disp Refills  . cetirizine (ZYRTEC) 10 MG tablet [Pharmacy Med Name: Cetirizine HCl 10 MG Oral Tablet] 30 tablet 0    Sig: Take 1 tablet by mouth once daily     Ear, Nose, and Throat:  Antihistamines 2 Failed - 06/12/2022 10:48 AM      Failed - Cr in normal range and within 360 days    Creatinine  Date Value Ref Range Status  02/14/2014 1.15 0.60 - 1.30 mg/dL Final   Creatinine, Ser  Date Value Ref Range Status  03/17/2022 1.06 (H) 0.57 - 1.00 mg/dL Final         Passed - Valid encounter within last 12 months    Recent Outpatient Visits          2 months ago Asthmatic bronchitis , chronic (HCC)   [facility] [place], Jolene T, NP   6 months ago Asthmatic bronchitis , chronic (HCC)   [facility] Cannady, Jolene T, NP   7 months ago Asthmatic bronchitis , chronic (HCC)   [facility] Cann[place], Jolene T, NP   9 months ago Asthmatic bronchitis , chronic (HCC)   [facility] Cannady, Jolene T, NP   11 months ago COPD exacerbation (HCC)   [facility] [place], Dorie Rank, NP      Future Appointments            In 3 months Cannady, Dorie Rank, NP [facility], [facility]

## 2022-07-15 ENCOUNTER — Other Ambulatory Visit: Payer: Self-pay | Admitting: Nurse Practitioner

## 2022-07-16 NOTE — Telephone Encounter (Signed)
Requested Prescriptions  Pending Prescriptions Disp Refills  . fluticasone-salmeterol (ADVAIR) 250-50 MCG/ACT AEPB [Pharmacy Med Name: Fluticasone-Salmeterol 250-50 MCG/DOSE Inhalation Aerosol Powder Breath Activated] 60 each 0    Sig: INHALE 1 DOSE BY MOUTH IN THE MORNING AND AT BEDTIME     Pulmonology:  Combination Products Passed - 07/15/2022 10:28 AM      Passed - Valid encounter within last 12 months    Recent Outpatient Visits          4 months ago Asthmatic bronchitis , chronic (HCC)   [facility] [place], Jolene T, NP   7 months ago Asthmatic bronchitis , chronic (HCC)   [facility] [place], Jolene T, NP   8 months ago Asthmatic bronchitis , chronic (HCC)   [facility] Cannady, Jolene T, NP   10 months ago Asthmatic bronchitis , chronic (HCC)   [facility] Cannady, Jolene T, NP   12 months ago COPD exacerbation (HCC)   [facility] [place], Dorie Rank, NP      Future Appointments            In 2 months Cannady, Dorie Rank, NP [facility], [facility]

## 2022-07-31 ENCOUNTER — Other Ambulatory Visit: Payer: Self-pay | Admitting: Nurse Practitioner

## 2022-07-31 DIAGNOSIS — J309 Allergic rhinitis, unspecified: Secondary | ICD-10-CM

## 2022-07-31 NOTE — Telephone Encounter (Signed)
Requested medication (s) are due for refill today: yes  Requested medication (s) are on the active medication list: yes  Last refill:  06/10/22 #16g 0 refills  Future visit scheduled: yes in 1 month  Notes to clinic:  do you want to give more refills?     Requested Prescriptions  Pending Prescriptions Disp Refills   fluticasone (FLONASE) 50 MCG/ACT nasal spray [Pharmacy Med Name: Fluticasone Propionate 50 MCG/ACT Nasal Suspension] 16 g 0    Sig: Use 2 spray(s) in each nostril once daily     Ear, Nose, and Throat: Nasal Preparations - Corticosteroids Passed - 07/31/2022 12:18 PM      Passed - Valid encounter within last 12 months    Recent Outpatient Visits           4 months ago Asthmatic bronchitis , chronic (HCC)   [facility] Cannady, Jolene T, NP   7 months ago Asthmatic bronchitis , chronic (HCC)   [facility] Cannady, Jolene T, NP   9 months ago Asthmatic bronchitis , chronic (HCC)   [facility] [place], Jolene T, NP   11 months ago Asthmatic bronchitis , chronic (HCC)   [facility] [place], Corrie Dandy T, NP   1 year ago COPD exacerbation (HCC)   [facility] [place], Dorie Rank, NP       Future Appointments             In 1 month Cannady, Dorie Rank, NP [facility], [facility]

## 2022-08-20 ENCOUNTER — Other Ambulatory Visit: Payer: Self-pay | Admitting: Nurse Practitioner

## 2022-08-20 NOTE — Telephone Encounter (Signed)
Requested Prescriptions  Pending Prescriptions Disp Refills  . fluticasone-salmeterol (ADVAIR) 250-50 MCG/ACT AEPB [Pharmacy Med Name: Fluticasone-Salmeterol 250-50 MCG/DOSE Inhalation Aerosol Powder Breath Activated] 60 each 0    Sig: INHALE 1 PUFF BY MOUTH IN THE MORNING AND 1 AT BEDTIME     Pulmonology:  Combination Products Passed - 08/20/2022  9:25 AM      Passed - Valid encounter within last 12 months    Recent Outpatient Visits          5 months ago Asthmatic bronchitis , chronic (HCC)   [facility] [place], Jolene T, NP   8 months ago Asthmatic bronchitis , chronic (HCC)   [facility] Cannady, Jolene T, NP   10 months ago Asthmatic bronchitis , chronic (HCC)   [facility] Cannady, Jolene T, NP   12 months ago Asthmatic bronchitis , chronic (HCC)   [facility] [place], Corrie Dandy T, NP   1 year ago COPD exacerbation (HCC)   [facility] [place], Dorie Rank, NP      Future Appointments            In 1 month Cannady, Dorie Rank, NP [facility], [facility]

## 2022-08-21 ENCOUNTER — Telehealth: Payer: Self-pay

## 2022-08-21 NOTE — Telephone Encounter (Signed)
Called and LVM asking for patient to please return my call. Patient is overdue for mammogram. Need to find out if patient would like to have one ordered and scheduled for her.   OK for [facility] to speak to the patient if she calls back and find out if she would like to have a mammogram.

## 2022-08-21 NOTE — Telephone Encounter (Signed)
Pt says that she will decline for now but will call to reschedule in the future

## 2022-08-22 NOTE — Telephone Encounter (Signed)
Noted  

## 2022-09-22 ENCOUNTER — Encounter: Payer: BC Managed Care – PPO | Admitting: Nurse Practitioner

## 2022-10-14 ENCOUNTER — Telehealth: Payer: BC Managed Care – PPO | Admitting: Nurse Practitioner

## 2022-10-14 DIAGNOSIS — J42 Unspecified chronic bronchitis: Secondary | ICD-10-CM | POA: Diagnosis not present

## 2022-10-14 MED ORDER — BENZONATATE 100 MG PO CAPS: 100.0000 mg | ORAL_CAPSULE | Freq: Three times a day (TID) | ORAL | 0 refills | Status: DC | PRN

## 2022-10-14 MED ORDER — DOXYCYCLINE HYCLATE 100 MG PO TABS: 100.0000 mg | ORAL_TABLET | Freq: Two times a day (BID) | ORAL | 0 refills | Status: AC

## 2022-10-14 NOTE — Progress Notes (Signed)
Virtual Visit Consent   Audrey Richard, you are scheduled for a virtual visit with a [facility] provider today. Just as with appointments in the office, your consent must be obtained to participate. Your consent will be active for this visit and any virtual visit you may have with one of our providers in the next 365 days. If you have a MyChart account, a copy of this consent can be sent to you electronically.  As this is a virtual visit, video technology does not allow for your provider to perform a traditional examination. This may limit your provider's ability to fully assess your condition. If your provider identifies any concerns that need to be evaluated in person or the need to arrange testing (such as labs, EKG, etc.), we will make arrangements to do so. Although advances in technology are sophisticated, we cannot ensure that it will always work on either your end or our end. If the connection with a video visit is poor, the visit may have to be switched to a telephone visit. With either a video or telephone visit, we are not always able to ensure that we have a secure connection.  By engaging in this virtual visit, you consent to the provision of healthcare and authorize for your insurance to be billed (if applicable) for the services provided during this visit. Depending on your insurance coverage, you may receive a charge related to this service.  I need to obtain your verbal consent now. Are you willing to proceed with your visit today? Audrey Richard has provided verbal consent on 10/14/2022 for a virtual visit (video or telephone). Apolonio Schneiders, FNP  Date: 10/14/2022 10:23 AM  Virtual Visit via Video Note   I, Apolonio Schneiders, connected with  Audrey Richard  ([id number], 05-15-1961) on 10/14/22 at 11:00 AM EDT by a video-enabled telemedicine application and verified that I am speaking with the correct person using two identifiers.  Location: Patient: Virtual Visit Location Patient:  Home Provider: Virtual Visit Location Provider: Home Office   I discussed the limitations of evaluation and management by telemedicine and the availability of in person appointments. The patient expressed understanding and agreed to proceed.    History of Present Illness: Audrey Richard is a 61 y.o. who identifies as a female who was assigned female at birth, and is being seen today for productive cough. She also has a sore throat and an earache.   Symptom onset was 2 days ago   She has had COVID in the past  Has not taken a COVID test  Denies fever   Patient has a history of chronic asthmatic bronchitis  Has Albuterol & Duo Neb and Advair   Most recent illness with cough medication and antibiotics was in June, that was more of a sinusitis   Also has allergies, stays on Flonase Singulair and Pataday eye drops daily  Problems:  Patient Active Problem List   Diagnosis Date Noted   Paronychia of finger, left 03/17/2022   Obesity 12/11/2021   Upper airway cough syndrome 08/06/2021   Aortic atherosclerosis ([place]) 07/13/2021   B12 deficiency 06/18/2021   Vitamin D deficiency 06/15/2021   Elevated hemoglobin A1c 05/14/2021   CKD (chronic kidney disease) stage 3, GFR 30-59 ml/min (HCC) 04/12/2021   Chronic back pain 01/10/2020   Acid reflux 09/29/2019   Primary generalized (osteo)arthritis 09/29/2019   Health care maintenance 09/29/2019   Asthmatic bronchitis , chronic 11/15/2018   Allergic rhinitis 12/08/2017   Osteopenia 06/11/2015  Depression 06/11/2015   Insomnia 06/11/2015   Hashimoto's thyroiditis 06/11/2015   Mixed hyperlipidemia 06/11/2015    Allergies:  Allergies  Allergen Reactions   Biaxin [Clarithromycin]     Patient reports itching, nausea   Iodine     Iodine blisters skin   Medications:   Current Outpatient Medications  Medication Instructions   albuterol (VENTOLIN HFA) 108 (90 Base) MCG/ACT inhaler 2 puffs, Inhalation, Every 6 hours PRN   buPROPion  (WELLBUTRIN XL) 150 MG 24 hr tablet TAKE ONE TABLET BY MOUTH EVERY DAY   cetirizine (ZYRTEC) 10 MG tablet Take 1 tablet by mouth once daily   Cholecalciferol (VITAMIN D3) 1.25 MG (50000 UT) CAPS Take 1 capsule by mouth once a week   cyclobenzaprine (FLEXERIL) 10 mg, Oral, Daily at bedtime   famotidine (PEPCID) 20 MG tablet One after supper   FLUoxetine (PROZAC) 20 mg, Oral, Daily   fluticasone (FLONASE) 50 MCG/ACT nasal spray Use 2 spray(s) in each nostril once daily   fluticasone-salmeterol (ADVAIR) 250-50 MCG/ACT AEPB INHALE 1 PUFF BY MOUTH IN THE MORNING AND 1 AT BEDTIME   gabapentin (NEURONTIN) 300 MG capsule Take one capsule (300 MG) by mouth in the morning and at noon, then before bedtime take 2 capsules (600 MG) by mouth.   ipratropium-albuterol (DUONEB) 0.5-2.5 (3) MG/3ML SOLN 3 mLs, Nebulization, Every 6 hours PRN   levothyroxine (SYNTHROID) 125 mcg, Oral, Daily   montelukast (SINGULAIR) 10 mg, Oral, Daily at bedtime   mupirocin ointment (BACTROBAN) 2 % 1 application , Topical, 2 times daily   olopatadine (PATADAY) 0.1 % ophthalmic solution 1 drop, Both Eyes, 2 times daily   omeprazole (PRILOSEC) 40 MG capsule TAKE ONE CAPSULE BY MOUTH EVERY EVENING   rosuvastatin (CRESTOR) 20 MG tablet TAKE ONE TABLET BY MOUTH EVERY DAY   traZODone (DESYREL) 50 mg, Oral, At bedtime PRN   triamcinolone ointment (KENALOG) 0.5 % 1 application , Topical, 2 times daily   vitamin B-12 (CYANOCOBALAMIN) 500 mcg, Oral, Daily    Observations/Objective: Patient is well-developed, well-nourished in no acute distress.  Resting comfortably  at home.  Head is normocephalic, atraumatic.  No labored breathing.  Speech is clear and coherent with logical content.  Patient is alert and oriented at baseline.    Assessment and Plan: 1. Chronic bronchitis, unspecified chronic bronchitis type ([place]) Continue inhaler use as discussed (Albuterol every 4-6 hours as needed)   - doxycycline (VIBRA-TABS) 100 MG tablet;  Take 1 tablet (100 mg total) by mouth 2 (two) times daily for 10 days.  Dispense: 20 tablet; Refill: 0 - benzonatate (TESSALON) 100 MG capsule; Take 1 capsule (100 mg total) by mouth 3 (three) times daily as needed.  Dispense: 30 capsule; Refill: 0    Continue Flonase use, may use Coricidin HBP for added support  Follow Up Instructions: I discussed the assessment and treatment plan with the patient. The patient was provided an opportunity to ask questions and all were answered. The patient agreed with the plan and demonstrated an understanding of the instructions.  A copy of instructions were sent to the patient via MyChart unless otherwise noted below.    The patient was advised to call back or seek an in-person evaluation if the symptoms worsen or if the condition fails to improve as anticipated.  Time:  I spent 15 minutes with the patient via telehealth technology discussing the above problems/concerns.    Apolonio Schneiders, FNP

## 2022-10-27 ENCOUNTER — Other Ambulatory Visit: Payer: Self-pay | Admitting: Nurse Practitioner

## 2022-10-27 ENCOUNTER — Telehealth: Payer: BC Managed Care – PPO | Admitting: Nurse Practitioner

## 2022-10-27 DIAGNOSIS — J0141 Acute recurrent pansinusitis: Secondary | ICD-10-CM | POA: Diagnosis not present

## 2022-10-27 DIAGNOSIS — J40 Bronchitis, not specified as acute or chronic: Secondary | ICD-10-CM | POA: Diagnosis not present

## 2022-10-27 MED ORDER — AMOXICILLIN-POT CLAVULANATE 875-125 MG PO TABS: 1.0000 | ORAL_TABLET | Freq: Two times a day (BID) | ORAL | 0 refills | Status: AC

## 2022-10-27 MED ORDER — FLUCONAZOLE 150 MG PO TABS: 150.0000 mg | ORAL_TABLET | Freq: Once | ORAL | 0 refills | Status: AC

## 2022-10-27 MED ORDER — PREDNISONE 20 MG PO TABS: 20.0000 mg | ORAL_TABLET | Freq: Two times a day (BID) | ORAL | 0 refills | Status: AC

## 2022-10-27 MED ORDER — ALBUTEROL SULFATE HFA 108 (90 BASE) MCG/ACT IN AERS: 2.0000 | INHALATION_SPRAY | Freq: Four times a day (QID) | RESPIRATORY_TRACT | 0 refills | Status: DC | PRN

## 2022-10-27 MED ORDER — PROMETHAZINE-DM 6.25-15 MG/5ML PO SYRP: 5.0000 mL | ORAL_SOLUTION | Freq: Four times a day (QID) | ORAL | 0 refills | Status: DC | PRN

## 2022-10-27 NOTE — Progress Notes (Signed)
Virtual Visit Consent   Audrey Richard, you are scheduled for a virtual visit with a [facility] provider today. Just as with appointments in the office, your consent must be obtained to participate. Your consent will be active for this visit and any virtual visit you may have with one of our providers in the next 365 days. If you have a MyChart account, a copy of this consent can be sent to you electronically.  As this is a virtual visit, video technology does not allow for your provider to perform a traditional examination. This may limit your provider's ability to fully assess your condition. If your provider identifies any concerns that need to be evaluated in person or the need to arrange testing (such as labs, EKG, etc.), we will make arrangements to do so. Although advances in technology are sophisticated, we cannot ensure that it will always work on either your end or our end. If the connection with a video visit is poor, the visit may have to be switched to a telephone visit. With either a video or telephone visit, we are not always able to ensure that we have a secure connection.  By engaging in this virtual visit, you consent to the provision of healthcare and authorize for your insurance to be billed (if applicable) for the services provided during this visit. Depending on your insurance coverage, you may receive a charge related to this service.  I need to obtain your verbal consent now. Are you willing to proceed with your visit today? Audrey Richard has provided verbal consent on 10/27/2022 for a virtual visit (video or telephone). Apolonio Schneiders, FNP  Date: 10/27/2022 1:29 PM  Virtual Visit via Video Note   I, Apolonio Schneiders, connected with  Audrey Richard  ([id number], July 31, 1961) on 10/27/22 at  1:30 PM EST by a video-enabled telemedicine application and verified that I am speaking with the correct person using two identifiers.  Location: Patient: Virtual Visit Location Patient:  Home Provider: Virtual Visit Location Provider: Home Office   I discussed the limitations of evaluation and management by telemedicine and the availability of in person appointments. The patient expressed understanding and agreed to proceed.    History of Present Illness: Audrey SHINGLEDECKER is a 61 y.o. who identifies as a female who was assigned female at birth, and is being seen today for complaints of cough and sinus congestion. She was most recently seen on 10/14/22 for treatment of chronic bronchitis with underlying asthma.   She was treated with doxycycline and increased Albuterol use at that time. Her cough has persisted, she has continued to use benzonatate for cough relief.   She has nasal congestion and PND that is making her throat sore.  Her cough is also productive.  No fevers, no improvement  Treated with Augmentin in June with good outcomes    Problems:  Patient Active Problem List   Diagnosis Date Noted   Paronychia of finger, left 03/17/2022   Obesity 12/11/2021   Upper airway cough syndrome 08/06/2021   Aortic atherosclerosis ([place]) 07/13/2021   B12 deficiency 06/18/2021   Vitamin D deficiency 06/15/2021   Elevated hemoglobin A1c 05/14/2021   CKD (chronic kidney disease) stage 3, GFR 30-59 ml/min (HCC) 04/12/2021   Chronic back pain 01/10/2020   Acid reflux 09/29/2019   Primary generalized (osteo)arthritis 09/29/2019   Health care maintenance 09/29/2019   Asthmatic bronchitis , chronic 11/15/2018   Allergic rhinitis 12/08/2017   Osteopenia 06/11/2015   Depression 06/11/2015  Insomnia 06/11/2015   Hashimoto's thyroiditis 06/11/2015   Mixed hyperlipidemia 06/11/2015    Allergies:  Allergies  Allergen Reactions   Biaxin [Clarithromycin]     Patient reports itching, nausea   Iodine     Iodine blisters skin   Medications:  Current Outpatient Medications:    Cholecalciferol (VITAMIN D3) 1.25 MG (50000 UT) CAPS, Take 1 capsule by mouth once a week, Disp: 12  capsule, Rfl: 4   fluticasone (FLONASE) 50 MCG/ACT nasal spray, Use 2 spray(s) in each nostril once daily, Disp: 16 g, Rfl: 4   albuterol (VENTOLIN HFA) 108 (90 Base) MCG/ACT inhaler, Inhale 2 puffs into the lungs every 6 (six) hours as needed for wheezing or shortness of breath., Disp: 18 g, Rfl: 4   benzonatate (TESSALON) 100 MG capsule, Take 1 capsule (100 mg total) by mouth 3 (three) times daily as needed., Disp: 30 capsule, Rfl: 0   buPROPion (WELLBUTRIN XL) 150 MG 24 hr tablet, TAKE ONE TABLET BY MOUTH EVERY DAY, Disp: 90 tablet, Rfl: 4   cetirizine (ZYRTEC) 10 MG tablet, Take 1 tablet by mouth once daily, Disp: 90 tablet, Rfl: 1   cyclobenzaprine (FLEXERIL) 10 MG tablet, Take 1 tablet (10 mg total) by mouth at bedtime., Disp: 90 tablet, Rfl: 3   famotidine (PEPCID) 20 MG tablet, One after supper, Disp: 30 tablet, Rfl: 11   FLUoxetine (PROZAC) 20 MG tablet, Take 1 tablet (20 mg total) by mouth daily., Disp: 90 tablet, Rfl: 4   fluticasone-salmeterol (ADVAIR) 250-50 MCG/ACT AEPB, INHALE 1 PUFF BY MOUTH IN THE MORNING AND 1 AT BEDTIME, Disp: 60 each, Rfl: 1   gabapentin (NEURONTIN) 300 MG capsule, Take one capsule (300 MG) by mouth in the morning and at noon, then before bedtime take 2 capsules (600 MG) by mouth., Disp: 360 capsule, Rfl: 4   ipratropium-albuterol (DUONEB) 0.5-2.5 (3) MG/3ML SOLN, Take 3 mLs by nebulization every 6 (six) hours as needed., Disp: 360 mL, Rfl: 2   levothyroxine (SYNTHROID) 125 MCG tablet, Take 1 tablet (125 mcg total) by mouth daily., Disp: 90 tablet, Rfl: 3   montelukast (SINGULAIR) 10 MG tablet, Take 1 tablet (10 mg total) by mouth at bedtime., Disp: 90 tablet, Rfl: 4   mupirocin ointment (BACTROBAN) 2 %, Apply 1 application. topically 2 (two) times daily., Disp: 22 g, Rfl: 3   olopatadine (PATADAY) 0.1 % ophthalmic solution, Place 1 drop into both eyes 2 (two) times daily., Disp: 5 mL, Rfl: 12   omeprazole (PRILOSEC) 40 MG capsule, TAKE ONE CAPSULE BY MOUTH EVERY  EVENING, Disp: , Rfl:    rosuvastatin (CRESTOR) 20 MG tablet, TAKE ONE TABLET BY MOUTH EVERY DAY, Disp: 90 tablet, Rfl: 4   traZODone (DESYREL) 50 MG tablet, Take 1 tablet (50 mg total) by mouth at bedtime as needed., Disp: 30 tablet, Rfl: 5   triamcinolone ointment (KENALOG) 0.5 %, Apply 1 application. topically 2 (two) times daily., Disp: 30 g, Rfl: 0   vitamin B-12 (CYANOCOBALAMIN) 500 MCG tablet, Take 1 tablet (500 mcg total) by mouth daily., Disp: 90 tablet, Rfl: 4  Observations/Objective: Patient is well-developed, well-nourished in no acute distress.  Resting comfortably  at home.  Head is normocephalic, atraumatic.  No labored breathing.  Speech is clear and coherent with logical content.  Patient is alert and oriented at baseline.    Assessment and Plan: 1. Bronchitis Use cough medication only as needed, stop Benzonatate use and do not combine with over the counter cough medication  - predniSONE (DELTASONE)  20 MG tablet; Take 1 tablet (20 mg total) by mouth 2 (two) times daily with a meal for 5 days.  Dispense: 10 tablet; Refill: 0 - albuterol (VENTOLIN HFA) 108 (90 Base) MCG/ACT inhaler; Inhale 2 puffs into the lungs every 6 (six) hours as needed for wheezing or shortness of breath.  Dispense: 8 g; Refill: 0 - fluconazole (DIFLUCAN) 150 MG tablet; Take 1 tablet (150 mg total) by mouth once for 1 dose.  Dispense: 1 tablet; Refill: 0 - promethazine-dextromethorphan (PROMETHAZINE-DM) 6.25-15 MG/5ML syrup; Take 5 mLs by mouth 4 (four) times daily as needed for cough.  Dispense: 118 mL; Refill: 0  2. Acute recurrent pansinusitis Take with food  - amoxicillin-clavulanate (AUGMENTIN) 875-125 MG tablet; Take 1 tablet by mouth 2 (two) times daily for 7 days.  Dispense: 14 tablet; Refill: 0     Follow Up Instructions: I discussed the assessment and treatment plan with the patient. The patient was provided an opportunity to ask questions and all were answered. The patient agreed with the  plan and demonstrated an understanding of the instructions.  A copy of instructions were sent to the patient via MyChart unless otherwise noted below.    The patient was advised to call back or seek an in-person evaluation if the symptoms worsen or if the condition fails to improve as anticipated.  Time:  I spent 10 minutes with the patient via telehealth technology discussing the above problems/concerns.    Viviano Simas, FNP

## 2022-10-28 NOTE — Telephone Encounter (Signed)
Left message for patient to get clarification of if patient is still taking requested inhaler or if patient is taking a new inhaler per Dr.Wert.   OK for [facility] to gather information if patient calls back.

## 2022-10-28 NOTE — Telephone Encounter (Signed)
Requested medication (s) are due for refill today - no  Requested medication (s) are on the active medication list -no  Future visit scheduled -no  Last refill: 06/18/21  Notes to clinic: Medication not assigned protocol- provider review   Requested Prescriptions  Pending Prescriptions Disp Refills   [place] 160-9-4.8 MCG/ACT AERO [Pharmacy Med Name: Judithann Sauger Aerosphere 160-9-4.8 MCG/ACT Inhalation Aerosol] 11 g 0    Sig: INHALE 2 PUFFS TWICE DAILY     Off-Protocol Failed - 10/27/2022 12:56 PM      Failed - Medication not assigned to a protocol, review manually.      Passed - Valid encounter within last 12 months    Recent Outpatient Visits           7 months ago Asthmatic bronchitis , chronic ([place])   [place], [place] T, NP   10 months ago Asthmatic bronchitis , chronic ([place])   [place], Henrine Screws T, NP   1 year ago Asthmatic bronchitis , chronic ([place])   [place], Henrine Screws T, NP   1 year ago Asthmatic bronchitis , chronic ([place])   [place], Henrine Screws T, NP   1 year ago COPD exacerbation ([place])   [place], Barbaraann Faster, NP                 Requested Prescriptions  Pending Prescriptions Disp Refills   BREZTRI AEROSPHERE 160-9-4.8 MCG/ACT AERO [Pharmacy Med Name: Judithann Sauger Aerosphere 160-9-4.8 MCG/ACT Inhalation Aerosol] 11 g 0    Sig: INHALE 2 PUFFS TWICE DAILY     Off-Protocol Failed - 10/27/2022 12:56 PM      Failed - Medication not assigned to a protocol, review manually.      Passed - Valid encounter within last 12 months    Recent Outpatient Visits           7 months ago Asthmatic bronchitis , chronic ([place])   [place], [place] T, NP   10 months ago Asthmatic bronchitis , chronic ([place])   [place], Henrine Screws T, NP   1 year ago Asthmatic bronchitis , chronic ([place])   [place],  Henrine Screws T, NP   1 year ago Asthmatic bronchitis , chronic ([place])   [place], Henrine Screws T, NP   1 year ago COPD exacerbation [facility])   [place], Barbaraann Faster, NP

## 2022-10-29 ENCOUNTER — Ambulatory Visit: Payer: Self-pay | Admitting: *Deleted

## 2022-10-29 ENCOUNTER — Other Ambulatory Visit: Payer: Self-pay | Admitting: Nurse Practitioner

## 2022-10-29 MED ORDER — FLUTICASONE-SALMETEROL 250-50 MCG/ACT IN AEPB: INHALATION_SPRAY | RESPIRATORY_TRACT | 4 refills | Status: DC

## 2022-10-29 NOTE — Telephone Encounter (Addendum)
She did a video visit and was given antibiotics.   It did not help.   I called back and they changed me to another antibiotic.   I'm hoping this will help.  Got cough medicine too.     I need my purple inhaler.   Fluticasone (Advair) discus inhaler powder  250 mg/ 50 mcg  I use it every day.     Reason for Disposition  Cough has been present for > 3 weeks  Answer Assessment - Initial Assessment Questions 1. ONSET: "When did the cough begin?"      I've been sick for several days.   I'm on my second round of antibiotics.   I did a video visit day before yesterday.   10/27/2022 because I wasn't doing any better. 2. SEVERITY: "How bad is the cough today?"      The first medications did not help the cough and congestion.   The next time they gave me another antibiotic that I just started yesterday.  They gave me a liquid cough medicine too.   It seems to be helping a little. The [employer] did not help me.     3. SPUTUM: "Describe the color of your sputum" (none, dry cough; clear, white, yellow, green)     Yellow with a little green 4. HEMOPTYSIS: "Are you coughing up any blood?" If so ask: "How much?" (flecks, streaks, tablespoons, etc.)     Not asked  5. DIFFICULTY BREATHING: "Are you having difficulty breathing?" If Yes, ask: "How bad is it?" (e.g., mild, moderate, severe)    - MILD: No SOB at rest, mild SOB with walking, speaks normally in sentences, can lie down, no retractions, pulse < 100.    - MODERATE: SOB at rest, SOB with minimal exertion and prefers to sit, cannot lie down flat, speaks in phrases, mild retractions, audible wheezing, pulse 100-120.    - SEVERE: Very SOB at rest, speaks in single words, struggling to breathe, sitting hunched forward, retractions, pulse > 120      I have shortness of breath due to my asthma.   It's worse when I'm sick.    I need the Advair inhaler.   I'm using my albuterol inhaler too.  Jolene also gave me a inhaler called Breztri.   Am I supposed to  take it and the Advair?   I called these in to my pharmacy for a refill but they didn't refill them.   They didn't tell me why.  6. FEVER: "Do you have a fever?" If Yes, ask: "What is your temperature, how was it measured, and when did it start?"     Not asked since on antibiotics 7. CARDIAC HISTORY: "Do you have any history of heart disease?" (e.g., heart attack, congestive heart failure)      Not asked since been evaluated  and being treated 8. LUNG HISTORY: "Do you have any history of lung disease?"  (e.g., pulmonary embolus, asthma, emphysema)     asthma 9. PE RISK FACTORS: "Do you have a history of blood clots?" (or: recent major surgery, recent prolonged travel, bedridden)     Not asked 10. OTHER SYMPTOMS: "Do you have any other symptoms?" (e.g., runny nose, wheezing, chest pain)       Coughing a lot, shortness of breath.   I'm out of my Advair inhaler and really need it. 11. PREGNANCY: "Is there any chance you are pregnant?" "When was your last menstrual period?"  N/A 12. TRAVEL: "Have you traveled out of the country in the last month?" (e.g., travel history, exposures)       N/A  Protocols used: Cough - Acute Productive-A-AH

## 2022-10-29 NOTE — Telephone Encounter (Signed)
  Chief Complaint: Needing refills on her inhalers, Advair and Breztri.   She wants to know if she should be taking both of these inhalers or just one or the other.  Symptoms: Did a video visit 10/27/2022 given 2nd round of antibiotics.   Still coughing a lot and having shortness of breath. Frequency: daily for several days Pertinent Negatives: Patient denies fever Disposition: [] ED /[] Urgent Care (no appt availability in office) / [] Appointment(In office/virtual)/ []  [facility]/ [] Home Care/ [] Refused Recommended Disposition /[] [facility]/ [x]  Follow-up with PCP Additional Notes: I have sent the request and questions regarding her inhalers to [facility] for , NP.   She doesn't know why the pharmacy is not filling these.   She also needs directions on which to take or does she take both.   Call refills into the [facility] on [address]   Pt. Agreeable to someone calling her back.   Number in chart is correct.

## 2022-10-29 NOTE — Telephone Encounter (Signed)
Spoke with patient and notified her of Jolene's recommendations. Patient verbalized understanding and has no further questions at this time.

## 2022-11-24 ENCOUNTER — Telehealth: Payer: BC Managed Care – PPO

## 2022-11-24 ENCOUNTER — Ambulatory Visit: Payer: Self-pay

## 2022-11-24 ENCOUNTER — Telehealth: Payer: BC Managed Care – PPO | Admitting: Physician Assistant

## 2022-11-24 DIAGNOSIS — B9689 Other specified bacterial agents as the cause of diseases classified elsewhere: Secondary | ICD-10-CM | POA: Diagnosis not present

## 2022-11-24 DIAGNOSIS — R051 Acute cough: Secondary | ICD-10-CM

## 2022-11-24 DIAGNOSIS — J209 Acute bronchitis, unspecified: Secondary | ICD-10-CM | POA: Diagnosis not present

## 2022-11-24 DIAGNOSIS — J019 Acute sinusitis, unspecified: Secondary | ICD-10-CM | POA: Diagnosis not present

## 2022-11-24 DIAGNOSIS — R062 Wheezing: Secondary | ICD-10-CM

## 2022-11-24 DIAGNOSIS — J441 Chronic obstructive pulmonary disease with (acute) exacerbation: Secondary | ICD-10-CM | POA: Diagnosis not present

## 2022-11-24 DIAGNOSIS — R0602 Shortness of breath: Secondary | ICD-10-CM

## 2022-11-24 NOTE — Progress Notes (Signed)
Virtual Visit Consent   Audrey Richard, you are scheduled for a virtual visit with a [facility] provider today. Just as with appointments in the office, your consent must be obtained to participate. Your consent will be active for this visit and any virtual visit you may have with one of our providers in the next 365 days. If you have a MyChart account, a copy of this consent can be sent to you electronically.  As this is a virtual visit, video technology does not allow for your provider to perform a traditional examination. This may limit your provider's ability to fully assess your condition. If your provider identifies any concerns that need to be evaluated in person or the need to arrange testing (such as labs, EKG, etc.), we will make arrangements to do so. Although advances in technology are sophisticated, we cannot ensure that it will always work on either your end or our end. If the connection with a video visit is poor, the visit may have to be switched to a telephone visit. With either a video or telephone visit, we are not always able to ensure that we have a secure connection.  By engaging in this virtual visit, you consent to the provision of healthcare and authorize for your insurance to be billed (if applicable) for the services provided during this visit. Depending on your insurance coverage, you may receive a charge related to this service.  I need to obtain your verbal consent now. Are you willing to proceed with your visit today? [name] has provided verbal consent on 11/24/2022 for a virtual visit (video or telephone). [name], PA-C  Date: 11/24/2022 4:59 PM  Virtual Visit via Video Note   I, [name], connected with  Audrey Richard  ([id number], 1961-07-13) on 11/24/22 at  5:00 PM EST by a video-enabled telemedicine application and verified that I am speaking with the correct person using two identifiers.  Location: Patient: Virtual Visit  Location Patient: Home Provider: Virtual Visit Location Provider: Home Office   I discussed the limitations of evaluation and management by telemedicine and the availability of in person appointments. The patient expressed understanding and agreed to proceed.    History of Present Illness: Audrey Richard is a 61 y.o. who identifies as a female who was assigned female at birth, and is being seen today for cough.  HPI: Cough This is a recurrent problem. Episode onset: previously treated a month ago (10/27/22) for bronchitis with augmentin; symptoms worsened over last couple days. The problem has been gradually worsening. The problem occurs constantly. The cough is Productive of sputum and productive of purulent sputum (so thick it is choking her). Associated symptoms include chest pain, chills, a fever (intermittent feels they come and go), shortness of breath and wheezing. The symptoms are aggravated by cold air and lying down. She has tried a beta-agonist inhaler, leukotriene antagonists, ipratropium inhaler, steroid inhaler and oral steroids (antibiotics (augmentin), steroids, inhalers) for the symptoms. The treatment provided no relief. Her past medical history is significant for asthma, bronchitis and pneumonia.     Problems:  Patient Active Problem List   Diagnosis Date Noted   Paronychia of finger, left 03/17/2022   Obesity 12/11/2021   Upper airway cough syndrome 08/06/2021   Aortic atherosclerosis (HCC) 07/13/2021   B12 deficiency 06/18/2021   Vitamin D deficiency 06/15/2021   Elevated hemoglobin A1c 05/14/2021   CKD (chronic kidney disease) stage 3, GFR 30-59 ml/min (HCC) 04/12/2021  Chronic back pain 01/10/2020   Acid reflux 09/29/2019   Primary generalized (osteo)arthritis 09/29/2019   Health care maintenance 09/29/2019   Asthmatic bronchitis , chronic 11/15/2018   Allergic rhinitis 12/08/2017   Osteopenia 06/11/2015   Depression 06/11/2015   Insomnia 06/11/2015    Hashimoto's thyroiditis 06/11/2015   Mixed hyperlipidemia 06/11/2015    Allergies:  Allergies  Allergen Reactions   Biaxin [Clarithromycin]     Patient reports itching, nausea   Iodine     Iodine blisters skin   Medications:  Current Outpatient Medications:    Cholecalciferol (VITAMIN D3) 1.25 MG (50000 UT) CAPS, Take 1 capsule by mouth once a week, Disp: 12 capsule, Rfl: 4   albuterol (VENTOLIN HFA) 108 (90 Base) MCG/ACT inhaler, Inhale 2 puffs into the lungs every 6 (six) hours as needed for wheezing or shortness of breath., Disp: 18 g, Rfl: 4   albuterol (VENTOLIN HFA) 108 (90 Base) MCG/ACT inhaler, Inhale 2 puffs into the lungs every 6 (six) hours as needed for wheezing or shortness of breath., Disp: 8 g, Rfl: 0   benzonatate (TESSALON) 100 MG capsule, Take 1 capsule (100 mg total) by mouth 3 (three) times daily as needed., Disp: 30 capsule, Rfl: 0   buPROPion (WELLBUTRIN XL) 150 MG 24 hr tablet, TAKE ONE TABLET BY MOUTH EVERY DAY, Disp: 90 tablet, Rfl: 4   cetirizine (ZYRTEC) 10 MG tablet, Take 1 tablet by mouth once daily, Disp: 90 tablet, Rfl: 1   cyclobenzaprine (FLEXERIL) 10 MG tablet, Take 1 tablet (10 mg total) by mouth at bedtime., Disp: 90 tablet, Rfl: 3   famotidine (PEPCID) 20 MG tablet, One after supper, Disp: 30 tablet, Rfl: 11   FLUoxetine (PROZAC) 20 MG tablet, Take 1 tablet (20 mg total) by mouth daily., Disp: 90 tablet, Rfl: 4   fluticasone (FLONASE) 50 MCG/ACT nasal spray, Use 2 spray(s) in each nostril once daily, Disp: 16 g, Rfl: 4   fluticasone-salmeterol (ADVAIR) 250-50 MCG/ACT AEPB, INHALE 1 PUFF BY MOUTH IN THE MORNING AND 1 AT BEDTIME, Disp: 60 each, Rfl: 4   gabapentin (NEURONTIN) 300 MG capsule, Take one capsule (300 MG) by mouth in the morning and at noon, then before bedtime take 2 capsules (600 MG) by mouth., Disp: 360 capsule, Rfl: 4   ipratropium-albuterol (DUONEB) 0.5-2.5 (3) MG/3ML SOLN, Take 3 mLs by nebulization every 6 (six) hours as needed., Disp:  360 mL, Rfl: 2   levothyroxine (SYNTHROID) 125 MCG tablet, Take 1 tablet (125 mcg total) by mouth daily., Disp: 90 tablet, Rfl: 3   montelukast (SINGULAIR) 10 MG tablet, Take 1 tablet (10 mg total) by mouth at bedtime., Disp: 90 tablet, Rfl: 4   mupirocin ointment (BACTROBAN) 2 %, Apply 1 application. topically 2 (two) times daily., Disp: 22 g, Rfl: 3   olopatadine (PATADAY) 0.1 % ophthalmic solution, Place 1 drop into both eyes 2 (two) times daily., Disp: 5 mL, Rfl: 12   omeprazole (PRILOSEC) 40 MG capsule, TAKE ONE CAPSULE BY MOUTH EVERY EVENING, Disp: , Rfl:    promethazine-dextromethorphan (PROMETHAZINE-DM) 6.25-15 MG/5ML syrup, Take 5 mLs by mouth 4 (four) times daily as needed for cough., Disp: 118 mL, Rfl: 0   rosuvastatin (CRESTOR) 20 MG tablet, TAKE ONE TABLET BY MOUTH EVERY DAY, Disp: 90 tablet, Rfl: 4   traZODone (DESYREL) 50 MG tablet, Take 1 tablet (50 mg total) by mouth at bedtime as needed., Disp: 30 tablet, Rfl: 5   triamcinolone ointment (KENALOG) 0.5 %, Apply 1 application. topically 2 (two) times  daily., Disp: 30 g, Rfl: 0   vitamin B-12 (CYANOCOBALAMIN) 500 MCG tablet, Take 1 tablet (500 mcg total) by mouth daily., Disp: 90 tablet, Rfl: 4  Observations/Objective: Patient is well-developed, well-nourished in no acute distress.  Resting  Head is normocephalic, atraumatic.  Labored breathing. Coughing frequently to affect speech some Speech is clear and coherent with logical content.  Patient is alert and oriented at baseline.    Assessment and Plan: 1. Shortness of breath  2. Wheezing  3. Acute cough  - Patient with significant cough causing labored breathing in setting of asthma history not improving with inhalers or nebulizer treatments at home; also has failed previous antibiotic and steroid therapy - Seek immediate in person evaluation at local UC or ER  Follow Up Instructions: I discussed the assessment and treatment plan with the patient. The patient was  provided an opportunity to ask questions and all were answered. The patient agreed with the plan and demonstrated an understanding of the instructions.  A copy of instructions were sent to the patient via MyChart unless otherwise noted below.     The patient was advised to call back or seek an in-person evaluation if the symptoms worsen or if the condition fails to improve as anticipated.  Time:  I spent 10 minutes with the patient via telehealth technology discussing the above problems/concerns.    [name], PA-C

## 2022-11-24 NOTE — Patient Instructions (Signed)
[name], thank you for joining [name], PA-C for today's virtual visit.  While this provider is not your primary care provider (PCP), if your PCP is located in our provider database this encounter information will be shared with them immediately following your visit.   A [facility] MyChart account gives you access to today's visit and all your visits, tests, and labs performed at [facility] " click here if you don't have a [facility] MyChart account or go to [email or web address]  Consent: (Patient) Audrey Richard provided verbal consent for this virtual visit at the beginning of the encounter.  Current Medications:  Current Outpatient Medications:    Cholecalciferol (VITAMIN D3) 1.25 MG (50000 UT) CAPS, Take 1 capsule by mouth once a week, Disp: 12 capsule, Rfl: 4   albuterol (VENTOLIN HFA) 108 (90 Base) MCG/ACT inhaler, Inhale 2 puffs into the lungs every 6 (six) hours as needed for wheezing or shortness of breath., Disp: 18 g, Rfl: 4   albuterol (VENTOLIN HFA) 108 (90 Base) MCG/ACT inhaler, Inhale 2 puffs into the lungs every 6 (six) hours as needed for wheezing or shortness of breath., Disp: 8 g, Rfl: 0   benzonatate (TESSALON) 100 MG capsule, Take 1 capsule (100 mg total) by mouth 3 (three) times daily as needed., Disp: 30 capsule, Rfl: 0   buPROPion (WELLBUTRIN XL) 150 MG 24 hr tablet, TAKE ONE TABLET BY MOUTH EVERY DAY, Disp: 90 tablet, Rfl: 4   cetirizine (ZYRTEC) 10 MG tablet, Take 1 tablet by mouth once daily, Disp: 90 tablet, Rfl: 1   cyclobenzaprine (FLEXERIL) 10 MG tablet, Take 1 tablet (10 mg total) by mouth at bedtime., Disp: 90 tablet, Rfl: 3   famotidine (PEPCID) 20 MG tablet, One after supper, Disp: 30 tablet, Rfl: 11   FLUoxetine (PROZAC) 20 MG tablet, Take 1 tablet (20 mg total) by mouth daily., Disp: 90 tablet, Rfl: 4   fluticasone (FLONASE) 50 MCG/ACT nasal spray, Use 2 spray(s) in each nostril once daily, Disp: 16 g, Rfl: 4    fluticasone-salmeterol (ADVAIR) 250-50 MCG/ACT AEPB, INHALE 1 PUFF BY MOUTH IN THE MORNING AND 1 AT BEDTIME, Disp: 60 each, Rfl: 4   gabapentin (NEURONTIN) 300 MG capsule, Take one capsule (300 MG) by mouth in the morning and at noon, then before bedtime take 2 capsules (600 MG) by mouth., Disp: 360 capsule, Rfl: 4   ipratropium-albuterol (DUONEB) 0.5-2.5 (3) MG/3ML SOLN, Take 3 mLs by nebulization every 6 (six) hours as needed., Disp: 360 mL, Rfl: 2   levothyroxine (SYNTHROID) 125 MCG tablet, Take 1 tablet (125 mcg total) by mouth daily., Disp: 90 tablet, Rfl: 3   montelukast (SINGULAIR) 10 MG tablet, Take 1 tablet (10 mg total) by mouth at bedtime., Disp: 90 tablet, Rfl: 4   mupirocin ointment (BACTROBAN) 2 %, Apply 1 application. topically 2 (two) times daily., Disp: 22 g, Rfl: 3   olopatadine (PATADAY) 0.1 % ophthalmic solution, Place 1 drop into both eyes 2 (two) times daily., Disp: 5 mL, Rfl: 12   omeprazole (PRILOSEC) 40 MG capsule, TAKE ONE CAPSULE BY MOUTH EVERY EVENING, Disp: , Rfl:    promethazine-dextromethorphan (PROMETHAZINE-DM) 6.25-15 MG/5ML syrup, Take 5 mLs by mouth 4 (four) times daily as needed for cough., Disp: 118 mL, Rfl: 0   rosuvastatin (CRESTOR) 20 MG tablet, TAKE ONE TABLET BY MOUTH EVERY DAY, Disp: 90 tablet, Rfl: 4   traZODone (DESYREL) 50 MG tablet, Take 1 tablet (50 mg total) by mouth at bedtime as needed.,  Disp: 30 tablet, Rfl: 5   triamcinolone ointment (KENALOG) 0.5 %, Apply 1 application. topically 2 (two) times daily., Disp: 30 g, Rfl: 0   vitamin B-12 (CYANOCOBALAMIN) 500 MCG tablet, Take 1 tablet (500 mcg total) by mouth daily., Disp: 90 tablet, Rfl: 4   Medications ordered in this encounter:  No orders of the defined types were placed in this encounter.    *If you need refills on other medications prior to your next appointment, please contact your pharmacy*  Follow-Up: Call back or seek an in-person evaluation if the symptoms worsen or if the condition  fails to improve as anticipated.  [place] [phone]   If you have been instructed to have an in-person evaluation today at a local Urgent Care facility, please use the link below. It will take you to a list of all of our available [facility], including address, phone number and hours of operation. Please do not delay care.  [facility]  If you or a family member do not have a primary care provider, use the link below to schedule a visit and establish care. When you choose a [facility] primary care physician or advanced practice provider, you gain a long-term partner in health. Find a Primary Care Provider  Learn more about 's in-office and virtual care options: [facility]

## 2022-11-24 NOTE — Telephone Encounter (Signed)
  Chief Complaint: Cough - productive Symptoms: Cough, may have fever. Losing voice Frequency: 2 days Pertinent Negatives: Patient denies  Disposition: [] ED /[x] Urgent Care (no appt availability in office) / [] Appointment(In office/virtual)/ []  [facility]/ [] Home Care/ [] Refused Recommended Disposition /[] [facility]/ []  Follow-up with PCP Additional Notes: Pt had has a productive cough for 2 days. Pt thinks she may have a fever. Cough is nearly constant. Pt has Hx of asthma.  Reason for Disposition  [1] MILD difficulty breathing (e.g., minimal/no SOB at rest, SOB with walking, pulse <100) AND [2] still present when not coughing  Answer Assessment - Initial Assessment Questions 1. ONSET: "When did the cough begin?"      2 days ago 2. SEVERITY: "How bad is the cough today?"      Severe 3. SPUTUM: "Describe the color of your sputum" (none, dry cough; clear, white, yellow, green)     Green and yellow 4. HEMOPTYSIS: "Are you coughing up any blood?" If so ask: "How much?" (flecks, streaks, tablespoons, etc.)      5. DIFFICULTY BREATHING: "Are you having difficulty breathing?" If Yes, ask: "How bad is it?" (e.g., mild, moderate, severe)    - MILD: No SOB at rest, mild SOB with walking, speaks normally in sentences, can lie down, no retractions, pulse < 100.    - MODERATE: SOB at rest, SOB with minimal exertion and prefers to sit, cannot lie down flat, speaks in phrases, mild retractions, audible wheezing, pulse 100-120.    - SEVERE: Very SOB at rest, speaks in single words, struggling to breathe, sitting hunched forward, retractions, pulse > 120      mild 6. FEVER: "Do you have a fever?" If Yes, ask: "What is your temperature, how was it measured, and when did it start?"     Feels like she has one 7. CARDIAC HISTORY: "Do you have any history of heart disease?" (e.g., heart attack, congestive heart failure)       8. LUNG HISTORY: "Do you have any history of lung  disease?"  (e.g., pulmonary embolus, asthma, emphysema)     asthma 9. PE RISK FACTORS: "Do you have a history of blood clots?" (or: recent major surgery, recent prolonged travel, bedridden)      10. OTHER SYMPTOMS: "Do you have any other symptoms?" (e.g., runny nose, wheezing, chest pain)        11. PREGNANCY: "Is there any chance you are pregnant?" "When was your last menstrual period?"        12. TRAVEL: "Have you traveled out of the country in the last month?" (e.g., travel history, exposures)  Protocols used: Cough - Acute Productive-A-AH

## 2022-12-04 ENCOUNTER — Other Ambulatory Visit: Payer: Self-pay

## 2022-12-04 ENCOUNTER — Emergency Department: Payer: BC Managed Care – PPO

## 2022-12-04 ENCOUNTER — Observation Stay
Admission: EM | Admit: 2022-12-04 | Discharge: 2022-12-05 | Disposition: A | Discharge status: Home / Self Care | Payer: BC Managed Care – PPO | Attending: Osteopathic Medicine | Admitting: Osteopathic Medicine

## 2022-12-04 DIAGNOSIS — E039 Hypothyroidism, unspecified: Secondary | ICD-10-CM | POA: Diagnosis not present

## 2022-12-04 DIAGNOSIS — J309 Allergic rhinitis, unspecified: Secondary | ICD-10-CM

## 2022-12-04 DIAGNOSIS — E669 Obesity, unspecified: Secondary | ICD-10-CM | POA: Diagnosis present

## 2022-12-04 DIAGNOSIS — I5032 Chronic diastolic (congestive) heart failure: Secondary | ICD-10-CM | POA: Diagnosis not present

## 2022-12-04 DIAGNOSIS — F32A Depression, unspecified: Secondary | ICD-10-CM | POA: Diagnosis present

## 2022-12-04 DIAGNOSIS — J45901 Unspecified asthma with (acute) exacerbation: Secondary | ICD-10-CM | POA: Insufficient documentation

## 2022-12-04 DIAGNOSIS — Z79899 Other long term (current) drug therapy: Secondary | ICD-10-CM | POA: Diagnosis not present

## 2022-12-04 DIAGNOSIS — R0602 Shortness of breath: Secondary | ICD-10-CM | POA: Diagnosis not present

## 2022-12-04 DIAGNOSIS — R531 Weakness: Secondary | ICD-10-CM | POA: Diagnosis not present

## 2022-12-04 DIAGNOSIS — Z1152 Encounter for screening for COVID-19: Secondary | ICD-10-CM | POA: Diagnosis not present

## 2022-12-04 DIAGNOSIS — N1831 Chronic kidney disease, stage 3a: Secondary | ICD-10-CM | POA: Diagnosis not present

## 2022-12-04 DIAGNOSIS — Z87891 Personal history of nicotine dependence: Secondary | ICD-10-CM | POA: Diagnosis not present

## 2022-12-04 DIAGNOSIS — J40 Bronchitis, not specified as acute or chronic: Secondary | ICD-10-CM

## 2022-12-04 DIAGNOSIS — J4541 Moderate persistent asthma with (acute) exacerbation: Secondary | ICD-10-CM | POA: Diagnosis not present

## 2022-12-04 DIAGNOSIS — J42 Unspecified chronic bronchitis: Secondary | ICD-10-CM

## 2022-12-04 DIAGNOSIS — E785 Hyperlipidemia, unspecified: Secondary | ICD-10-CM | POA: Diagnosis present

## 2022-12-04 DIAGNOSIS — R059 Cough, unspecified: Secondary | ICD-10-CM | POA: Diagnosis not present

## 2022-12-04 DIAGNOSIS — A419 Sepsis, unspecified organism: Secondary | ICD-10-CM | POA: Diagnosis present

## 2022-12-04 DIAGNOSIS — E876 Hypokalemia: Secondary | ICD-10-CM | POA: Diagnosis not present

## 2022-12-04 DIAGNOSIS — J441 Chronic obstructive pulmonary disease with (acute) exacerbation: Secondary | ICD-10-CM | POA: Diagnosis not present

## 2022-12-04 LAB — CBC WITH DIFFERENTIAL/PLATELET
Abs Immature Granulocytes: 0.29 10*3/uL — ABNORMAL HIGH (ref 0.00–0.07)
Basophils Absolute: 0.1 10*3/uL (ref 0.0–0.1)
Basophils Relative: 0 %
Eosinophils Absolute: 0.2 10*3/uL (ref 0.0–0.5)
Eosinophils Relative: 1 %
HCT: 44.8 % (ref 36.0–46.0)
Hemoglobin: 14.6 g/dL (ref 12.0–15.0)
Immature Granulocytes: 2 %
Lymphocytes Relative: 14 %
Lymphs Abs: 1.9 10*3/uL (ref 0.7–4.0)
MCH: 30.6 pg (ref 26.0–34.0)
MCHC: 32.6 g/dL (ref 30.0–36.0)
MCV: 93.9 fL (ref 80.0–100.0)
Monocytes Absolute: 0.8 10*3/uL (ref 0.1–1.0)
Monocytes Relative: 6 %
Neutro Abs: 10.5 10*3/uL — ABNORMAL HIGH (ref 1.7–7.7)
Neutrophils Relative %: 77 %
Platelets: 323 10*3/uL (ref 150–400)
RBC: 4.77 MIL/uL (ref 3.87–5.11)
RDW: 13.4 % (ref 11.5–15.5)
WBC: 13.7 10*3/uL — ABNORMAL HIGH (ref 4.0–10.5)
nRBC: 0 % (ref 0.0–0.2)

## 2022-12-04 LAB — COMPREHENSIVE METABOLIC PANEL
ALT: 22 U/L (ref 0–44)
AST: 17 U/L (ref 15–41)
Albumin: 3.5 g/dL (ref 3.5–5.0)
Alkaline Phosphatase: 41 U/L (ref 38–126)
Anion gap: 6 (ref 5–15)
BUN: 21 mg/dL (ref 8–23)
CO2: 22 mmol/L (ref 22–32)
Calcium: 8.2 mg/dL — ABNORMAL LOW (ref 8.9–10.3)
Chloride: 109 mmol/L (ref 98–111)
Creatinine, Ser: 1.12 mg/dL — ABNORMAL HIGH (ref 0.44–1.00)
GFR, Estimated: 56 mL/min — ABNORMAL LOW (ref >60)
Glucose, Bld: 96 mg/dL (ref 70–99)
Potassium: 3.4 mmol/L — ABNORMAL LOW (ref 3.5–5.1)
Sodium: 137 mmol/L (ref 135–145)
Total Bilirubin: 0.7 mg/dL (ref 0.3–1.2)
Total Protein: 6.5 g/dL (ref 6.5–8.1)

## 2022-12-04 LAB — RESP PANEL BY RT-PCR (RSV, FLU A&B, COVID)  RVPGX2
Influenza A by PCR: NEGATIVE
Influenza B by PCR: NEGATIVE
Resp Syncytial Virus by PCR: NEGATIVE
SARS Coronavirus 2 by RT PCR: NEGATIVE

## 2022-12-04 LAB — D-DIMER, QUANTITATIVE: D-Dimer, Quant: <0.27 ug/mL-FEU (ref 0.00–0.50)

## 2022-12-04 LAB — LACTIC ACID, PLASMA: Lactic Acid, Venous: 1.9 mmol/L (ref 0.5–1.9)

## 2022-12-04 LAB — PROCALCITONIN: Procalcitonin: <0.1 ng/mL

## 2022-12-04 LAB — BRAIN NATRIURETIC PEPTIDE: B Natriuretic Peptide: 25.9 pg/mL (ref 0.0–100.0)

## 2022-12-04 LAB — MAGNESIUM: Magnesium: 2.3 mg/dL (ref 1.7–2.4)

## 2022-12-04 LAB — TROPONIN I (HIGH SENSITIVITY): Troponin I (High Sensitivity): 2 ng/L (ref <18)

## 2022-12-04 MED ORDER — METHYLPREDNISOLONE SODIUM SUCC 125 MG IJ SOLR
80.0000 mg | INTRAMUSCULAR | Status: DC
  Administered 2022-12-04: 80 mg via INTRAVENOUS
  Filled 2022-12-04: qty 2

## 2022-12-04 MED ORDER — ALBUTEROL SULFATE (2.5 MG/3ML) 0.083% IN NEBU: 2.5000 mg | INHALATION_SOLUTION | RESPIRATORY_TRACT | Status: DC | PRN

## 2022-12-04 MED ORDER — LEVOTHYROXINE SODIUM 25 MCG PO TABS
125.0000 ug | ORAL_TABLET | Freq: Every day | ORAL | Status: DC
  Administered 2022-12-05: 125 ug via ORAL
  Filled 2022-12-04: qty 1

## 2022-12-04 MED ORDER — FLUOXETINE HCL 20 MG PO CAPS
20.0000 mg | ORAL_CAPSULE | Freq: Every day | ORAL | Status: DC
  Administered 2022-12-04 – 2022-12-05 (×2): 20 mg via ORAL
  Filled 2022-12-04 (×2): qty 1

## 2022-12-04 MED ORDER — IPRATROPIUM-ALBUTEROL 0.5-2.5 (3) MG/3ML IN SOLN
3.0000 mL | RESPIRATORY_TRACT | Status: DC
  Administered 2022-12-04 – 2022-12-05 (×6): 3 mL via RESPIRATORY_TRACT
  Filled 2022-12-04 (×6): qty 3

## 2022-12-04 MED ORDER — GABAPENTIN 300 MG PO CAPS
600.0000 mg | ORAL_CAPSULE | Freq: Every day | ORAL | Status: DC
  Administered 2022-12-04: 600 mg via ORAL
  Filled 2022-12-04: qty 2

## 2022-12-04 MED ORDER — POTASSIUM CHLORIDE CRYS ER 20 MEQ PO TBCR
40.0000 meq | EXTENDED_RELEASE_TABLET | Freq: Once | ORAL | Status: AC
  Administered 2022-12-04: 40 meq via ORAL
  Filled 2022-12-04: qty 2

## 2022-12-04 MED ORDER — DOXYCYCLINE HYCLATE 100 MG PO TABS
100.0000 mg | ORAL_TABLET | Freq: Two times a day (BID) | ORAL | Status: DC
  Administered 2022-12-04 – 2022-12-05 (×2): 100 mg via ORAL
  Filled 2022-12-04 (×2): qty 1

## 2022-12-04 MED ORDER — BUPROPION HCL ER (XL) 150 MG PO TB24
150.0000 mg | ORAL_TABLET | Freq: Every day | ORAL | Status: DC
  Administered 2022-12-04 – 2022-12-05 (×2): 150 mg via ORAL
  Filled 2022-12-04 (×2): qty 1

## 2022-12-04 MED ORDER — POLYETHYLENE GLYCOL 3350 17 G PO PACK
17.0000 g | PACK | Freq: Every day | ORAL | Status: DC | PRN
  Administered 2022-12-04: 17 g via ORAL
  Filled 2022-12-04: qty 1

## 2022-12-04 MED ORDER — ACETAMINOPHEN 325 MG PO TABS
650.0000 mg | ORAL_TABLET | Freq: Four times a day (QID) | ORAL | Status: DC | PRN
  Administered 2022-12-05: 650 mg via ORAL
  Filled 2022-12-04: qty 2

## 2022-12-04 MED ORDER — METHYLPREDNISOLONE SODIUM SUCC 125 MG IJ SOLR
125.0000 mg | Freq: Once | INTRAMUSCULAR | Status: AC
  Administered 2022-12-04: 125 mg via INTRAVENOUS
  Filled 2022-12-04: qty 2

## 2022-12-04 MED ORDER — PANTOPRAZOLE SODIUM 40 MG PO TBEC
40.0000 mg | DELAYED_RELEASE_TABLET | Freq: Every day | ORAL | Status: DC
  Administered 2022-12-04 – 2022-12-05 (×2): 40 mg via ORAL
  Filled 2022-12-04 (×2): qty 1

## 2022-12-04 MED ORDER — CYANOCOBALAMIN 500 MCG PO TABS
500.0000 ug | ORAL_TABLET | Freq: Every day | ORAL | Status: DC
  Administered 2022-12-04 – 2022-12-05 (×2): 500 ug via ORAL
  Filled 2022-12-04 (×2): qty 1

## 2022-12-04 MED ORDER — IPRATROPIUM-ALBUTEROL 0.5-2.5 (3) MG/3ML IN SOLN
9.0000 mL | Freq: Once | RESPIRATORY_TRACT | Status: AC
  Administered 2022-12-04: 9 mL via RESPIRATORY_TRACT
  Filled 2022-12-04: qty 9

## 2022-12-04 MED ORDER — ONDANSETRON HCL 4 MG/2ML IJ SOLN: 4.0000 mg | Freq: Three times a day (TID) | INTRAMUSCULAR | Status: DC | PRN

## 2022-12-04 MED ORDER — DM-GUAIFENESIN ER 30-600 MG PO TB12
1.0000 | ORAL_TABLET | Freq: Two times a day (BID) | ORAL | Status: DC | PRN
  Administered 2022-12-04: 1 via ORAL
  Filled 2022-12-04: qty 1

## 2022-12-04 MED ORDER — TRAZODONE HCL 50 MG PO TABS
50.0000 mg | ORAL_TABLET | Freq: Every evening | ORAL | Status: DC | PRN
  Administered 2022-12-04: 50 mg via ORAL
  Filled 2022-12-04: qty 1

## 2022-12-04 MED ORDER — LORATADINE 10 MG PO TABS
10.0000 mg | ORAL_TABLET | Freq: Every day | ORAL | Status: DC
  Administered 2022-12-04 – 2022-12-05 (×2): 10 mg via ORAL
  Filled 2022-12-04 (×2): qty 1

## 2022-12-04 MED ORDER — ROSUVASTATIN CALCIUM 20 MG PO TABS
20.0000 mg | ORAL_TABLET | Freq: Every day | ORAL | Status: DC
  Administered 2022-12-05: 20 mg via ORAL
  Filled 2022-12-04: qty 1

## 2022-12-04 MED ORDER — SODIUM CHLORIDE 0.9 % IV BOLUS
1000.0000 mL | Freq: Once | INTRAVENOUS | Status: AC
  Administered 2022-12-04: 1000 mL via INTRAVENOUS

## 2022-12-04 MED ORDER — FAMOTIDINE 20 MG PO TABS
20.0000 mg | ORAL_TABLET | Freq: Every day | ORAL | Status: DC
  Administered 2022-12-04 – 2022-12-05 (×2): 20 mg via ORAL
  Filled 2022-12-04 (×2): qty 1

## 2022-12-04 MED ORDER — GABAPENTIN 300 MG PO CAPS
300.0000 mg | ORAL_CAPSULE | Freq: Two times a day (BID) | ORAL | Status: DC
  Administered 2022-12-05: 300 mg via ORAL
  Filled 2022-12-04: qty 1

## 2022-12-04 MED ORDER — ENOXAPARIN SODIUM 60 MG/0.6ML IJ SOSY
0.5000 mg/kg | PREFILLED_SYRINGE | INTRAMUSCULAR | Status: DC
  Administered 2022-12-04: 45 mg via SUBCUTANEOUS
  Filled 2022-12-04: qty 0.6

## 2022-12-04 MED ORDER — MONTELUKAST SODIUM 10 MG PO TABS
10.0000 mg | ORAL_TABLET | Freq: Every day | ORAL | Status: DC
  Administered 2022-12-04: 10 mg via ORAL
  Filled 2022-12-04: qty 1

## 2022-12-04 MED ORDER — ALBUTEROL SULFATE (2.5 MG/3ML) 0.083% IN NEBU
2.5000 mg | INHALATION_SOLUTION | Freq: Once | RESPIRATORY_TRACT | Status: AC
  Administered 2022-12-04: 2.5 mg via RESPIRATORY_TRACT
  Filled 2022-12-04: qty 3

## 2022-12-04 MED ORDER — SENNOSIDES-DOCUSATE SODIUM 8.6-50 MG PO TABS: 1.0000 | ORAL_TABLET | Freq: Every evening | ORAL | Status: DC | PRN

## 2022-12-04 MED ORDER — CYCLOBENZAPRINE HCL 10 MG PO TABS
10.0000 mg | ORAL_TABLET | Freq: Every day | ORAL | Status: DC
  Administered 2022-12-04: 10 mg via ORAL
  Filled 2022-12-04: qty 1

## 2022-12-04 MED ORDER — MOMETASONE FURO-FORMOTEROL FUM 200-5 MCG/ACT IN AERO
2.0000 | INHALATION_SPRAY | Freq: Two times a day (BID) | RESPIRATORY_TRACT | Status: DC
  Administered 2022-12-04 – 2022-12-05 (×2): 2 via RESPIRATORY_TRACT
  Filled 2022-12-04 (×2): qty 8.8

## 2022-12-04 NOTE — ED Notes (Signed)
Pt had an empty dinner tray and sts she ate it but didn't like it. Pt asked for a sandwich tray which she was given.

## 2022-12-04 NOTE — ED Notes (Signed)
Pt has specimen cup for sputum sample; pt reminded to provide sample if option comes up. Pt's cough currently dry.

## 2022-12-04 NOTE — ED Notes (Signed)
Called dietary to make sure dinner tray makes it to correct room since pt recently moved from room 46 to 35. Pt given phone to make specific order herself. Dietary states will send her tray soon.

## 2022-12-04 NOTE — ED Notes (Signed)
Pt states it has been between 4 and 6 days since having a good BM.

## 2022-12-04 NOTE — ED Notes (Signed)
PT ambulated to the bathroom without assistance 

## 2022-12-04 NOTE — ED Provider Notes (Signed)
[facility] Provider Note    Event Date/Time   First MD Initiated Contact with Patient 12/04/22 1319     (approximate)   History   Chief Complaint Cough and Weakness   HPI  Audrey Richard is a 61 y.o. female with past medical history of hyperlipidemia, asthma, chronic kidney disease, and hypothyroidism who presents to the ED complaining of cough and weakness.  Patient reports that she has been dealing with approximately 1 month of nonproductive cough, now dealing with increasing difficulty breathing.  She states she has been using a nebulizer at home, was recently prescribed Levaquin and course of prednisone by her PCP with no improvement.  She went to follow-up with her PCP today, was referred to the ED for further evaluation.  She denies any fevers, does report pressure in her chest, especially when coughing.  She has not noticed any pain or swelling in her legs.  She is not aware of any sick contacts.     Physical Exam   Triage Vital Signs: ED Triage Vitals  Enc Vitals Group     BP 12/04/22 1230 (!) 116/90     Pulse Rate 12/04/22 1230 88     Resp 12/04/22 1230 (!) 22     Temp 12/04/22 1230 98.2 F (36.8 C)     Temp Source 12/04/22 1230 Oral     SpO2 12/04/22 1230 99 %     Weight --      Height 12/04/22 1231 5\' 7"  (1.702 m)     Head Circumference --      Peak Flow --      Pain Score 12/04/22 1230 8     Pain Loc --      Pain Edu? --      Excl. in GC? --     Most recent vital signs: Vitals:   12/04/22 1230  BP: (!) 116/90  Pulse: 88  Resp: (!) 22  Temp: 98.2 F (36.8 C)  SpO2: 99%    Constitutional: Alert and oriented. Eyes: Conjunctivae are normal. Head: Atraumatic. Nose: No congestion/rhinnorhea. Mouth/Throat: Mucous membranes are moist.  Cardiovascular: Normal rate, regular rhythm. Grossly normal heart sounds.  2+ radial pulses bilaterally. Respiratory: Mildly tachypneic with increased respiratory effort.  No retractions. Lungs  with inspiratory and expiratory wheezing throughout. Gastrointestinal: Soft and nontender. No distention. Musculoskeletal: No lower extremity tenderness nor edema.  Neurologic:  Normal speech and language. No gross focal neurologic deficits are appreciated.    ED Results / Procedures / Treatments   Labs (all labs ordered are listed, but only abnormal results are displayed) Labs Reviewed  COMPREHENSIVE METABOLIC PANEL - Abnormal; Notable for the following components:      Result Value   Potassium 3.4 (*)    Creatinine, Ser 1.12 (*)    Calcium 8.2 (*)    GFR, Estimated 56 (*)    All other components within normal limits  CBC WITH DIFFERENTIAL/PLATELET - Abnormal; Notable for the following components:   WBC 13.7 (*)    Neutro Abs 10.5 (*)    Abs Immature Granulocytes 0.29 (*)    All other components within normal limits  RESP PANEL BY RT-PCR (RSV, FLU A&B, COVID)  [id number]  D-DIMER, QUANTITATIVE  TROPONIN I (HIGH SENSITIVITY)     EKG  ED ECG REPORT I, 12/06/22, the attending physician, personally viewed and interpreted this ECG.   Date: 12/04/2022  EKG Time: 13:45  Rate: 81  Rhythm: normal sinus rhythm  Axis: Normal  Intervals:none  ST&T Change: None  RADIOLOGY Chest x-ray reviewed and interpreted by me with no infiltrate, edema, or effusion.  PROCEDURES:  Critical Care performed: No  Procedures   MEDICATIONS ORDERED IN ED: Medications  ipratropium-albuterol (DUONEB) 0.5-2.5 (3) MG/3ML nebulizer solution 9 mL (9 mLs Nebulization Given 12/04/22 1403)  methylPREDNISolone sodium succinate (SOLU-MEDROL) 125 mg/2 mL injection 125 mg (125 mg Intravenous Given 12/04/22 1403)  potassium chloride SA (KLOR-CON M) CR tablet 40 mEq (40 mEq Oral Given 12/04/22 1435)  albuterol (PROVENTIL) (2.5 MG/3ML) 0.083% nebulizer solution 2.5 mg (2.5 mg Nebulization Given 12/04/22 1530)     IMPRESSION / MDM / ASSESSMENT AND PLAN / ED COURSE  I reviewed the triage vital signs  and the nursing notes.                              61 y.o. female with past medical history of hyperlipidemia, asthma, CKD, and hypothyroidism who presents to the ED complaining of 1 month of persistent cough now associated with increasing difficulty breathing.  Patient's presentation is most consistent with acute presentation with potential threat to life or bodily function.  Differential diagnosis includes, but is not limited to, ACS, arrhythmia, PE, pneumonia, pneumothorax, asthma exacerbation, CHF.  Patient uncomfortable appearing, mildly tachypneic with increased work of breathing and significant wheezing noted on exam.  She continues to maintain oxygen saturations at 99% on room air, remainder of vital signs are reassuring.  Symptoms seem most consistent with asthma exacerbation and we will treat with Solu-Medrol along with DuoNebs, but given she has not had any improvement with similar outpatient management, will expand workup and check D-dimer.  Overall suspicion for PE is low as patient is low risk by Wells.  Chest x-ray is unremarkable and viral testing is negative for COVID-19 and influenza.  Labs thus far remarkable for mild leukocytosis, no significant anemia, electrolyte abnormality, or AKI noted.  Will add on troponin given her occasional chest pressure but symptoms seem atypical for ACS.  Labs are reassuring, troponin and D-dimer within normal limits.  Doubt PE as patient is low risk by Wells.  She reports symptoms are slightly improved following DuoNeb's and steroids, however she continues to be mildly tachypneic with significant ongoing wheezing.  We will give additional albuterol, patient agreeable to admission for further treatment of asthma exacerbation.  Case discussed with hospitalist for admission.      FINAL CLINICAL IMPRESSION(S) / ED DIAGNOSES   Final diagnoses:  Exacerbation of persistent asthma, unspecified asthma severity  Shortness of breath     Rx / DC Orders    ED Discharge Orders     None        Note:  This document was prepared using Dragon voice recognition software and may include unintentional dictation errors.   Chesley Noon, MD 12/04/22 1538

## 2022-12-04 NOTE — ED Triage Notes (Signed)
First RN:  Pt from [place] [facility] clinic complaining of shortness of breath. [facility] sent for PE rule out.

## 2022-12-04 NOTE — ED Notes (Signed)
Pt received dinner tray and drink. Bedside table adjusted for pt.

## 2022-12-04 NOTE — Progress Notes (Signed)
PHARMACIST - PHYSICIAN COMMUNICATION  CONCERNING:  Enoxaparin (Lovenox) for DVT Prophylaxis    RECOMMENDATION: Patient was prescribed enoxaprin 40mg  q24 hours for VTE prophylaxis.   Filed Weights   12/04/22 1519  Weight: 89.5 kg (197 lb 5 oz)    Body mass index is 30.9 kg/m.  Estimated Creatinine Clearance: 60.6 mL/min (A) (by C-G formula based on SCr of 1.12 mg/dL (H)).   Based on [facility] policy patient is candidate for enoxaparin 0.5mg /kg TBW SQ every 24 hours based on BMI being >30.  DESCRIPTION: Pharmacy has adjusted enoxaparin dose per [facility] policy.  Patient is now receiving enoxaparin 45 mg every 24 hours    [facility], PharmD Clinical Pharmacist  12/04/2022 3:53 PM

## 2022-12-04 NOTE — ED Provider Triage Note (Signed)
Emergency Medicine Provider Triage Evaluation Note  Audrey Richard , a 60 y.o. female  was evaluated in triage.  Pt complains of difficulty breathing, cough and congestion.  Been sick on and off for months.  States she gets a little better than give her medication and then she relapses.  Is becoming very frustrated..  Review of Systems  Positive:  Negative:   Physical Exam  BP (!) 116/90 (BP Location: Left Arm)   Pulse 88   Temp 98.2 F (36.8 C) (Oral)   Resp (!) 22   Ht 5\' 7"  (1.702 m)   LMP  (LMP Unknown)   SpO2 99%   BMI 30.92 kg/m  Gen:   Awake, no distress   Resp:  Normal effort  MSK:   Moves extremities without difficulty  Other:    Medical Decision Making  Medically screening exam initiated at 12:32 PM.  Appropriate orders placed.  Audrey Richard was informed that the remainder of the evaluation will be completed by another provider, this initial triage assessment does not replace that evaluation, and the importance of remaining in the ED until their evaluation is complete.     [name], PA-C 12/04/22 1232

## 2022-12-04 NOTE — Progress Notes (Signed)
PHARMACIST - PHYSICIAN COMMUNICATION   CONCERNING: Methylprednisolone IV    Current order: Methylprednisolone IV 40 mg IV every 12 hours     DESCRIPTION: Per [facility] Protocol:   IV methylprednisolone will be converted to either a q12h or q24h frequency with the same total daily dose (TDD).  Ordered Dose: 1 to 125 mg TDD; convert to: TDD q24h.  Ordered Dose: 126 to 250 mg TDD; convert to: TDD div q12h.  Ordered Dose: >250 mg TDD; DAW.  Order has been adjusted to: Methylprednisolone IV 80 mg IV every 24 hours   Barrie Folk , PharmD Clinical Pharmacist  12/04/2022 4:12 PM

## 2022-12-04 NOTE — ED Triage Notes (Signed)
Pt was sent by PCP for cough, weakness, and SOB x2 months. Pt reports hx of asthma.

## 2022-12-04 NOTE — H&P (Signed)
History and Physical    Audrey Richard:[id number] DOB: Apr 01, 1961 DOA: 12/04/2022  Referring MD/NP/PA:   PCP: Marjie Skiff, NP   Patient coming from:  The patient is coming from home.  At baseline, pt is independent for most of ADL.        Chief Complaint: Cough, shortness of breath, wheezing  HPI: Audrey Richard is a 61 y.o. female with medical history significant of COPD, asthma, hyperlipidemia, GERD, hypothyroidism, depression, CKD-3A, obesity obesity BMI 30.9, diastolic CHF, who presents with cough, shortness breath, wheezing.  Patient states that she has shortness of breath for more than 1 month.  Patient has wheezing and productive cough with yellow-colored sputum production.  No chest pain.  Patient was treated with 1 course of Levaquin and prednisone by PCP without improvement.  She just completed a course of Levaquin yesterday.  No fever or chills.  Patient does not have nausea, vomiting, diarrhea or abdominal pain.  Patient is constipated.  No symptoms of UTI. She went to follow-up with her PCP today, was referred to the ED for further evaluation.   Data reviewed independently and ED Course: pt was found to have negative D-dimer <0.27, WBC 13.7, lactic acid 1.9, troponin level 2, BNP 25.9, negative COVID PCR, negative flu PCR and negative RSV.  Potassium 3.4, renal function close to baseline, temperature normal, blood pressure 116/90, heart rate 88 --> 104, RR 22.  Oxygen saturation 99% on room air.  Chest x-ray negative for infiltration.  Patient is placed on telemetry bed for observation.   EKG: I have personally reviewed.  Sinus rhythm, QTc 446, early R wave progression, T wave inversion in V1-V3.   Review of Systems:   General: no fevers, chills, no body weight gain, has fatigue HEENT: no blurry vision, hearing changes or sore throat Respiratory: has dyspnea, coughing, wheezing CV: no chest pain, no palpitations GI: no nausea, vomiting, abdominal pain,  diarrhea, constipation GU: no dysuria, burning on urination, increased urinary frequency, hematuria  Ext: no leg edema Neuro: no unilateral weakness, numbness, or tingling, no vision change or hearing loss Skin: no rash, no skin tear. MSK: No muscle spasm, no deformity, no limitation of range of movement in spin Heme: No easy bruising.  Travel history: No recent long distant travel.   Allergy:  Allergies  Allergen Reactions   Biaxin [Clarithromycin]     Patient reports itching, nausea   Iodine     Iodine blisters skin    Past Medical History:  Diagnosis Date   Asthma    COPD (chronic obstructive pulmonary disease) (HCC)    Depression    High cholesterol    Osteoporosis    Throat ulcer    Thyroid disease     Past Surgical History:  Procedure Laterality Date   TUBAL LIGATION      Social History:  reports that she quit smoking about 17 years ago. Her smoking use included cigarettes. She has never used smokeless tobacco. She reports that she does not currently use alcohol. She reports that she does not use drugs.  Family History:  Family History  Problem Relation Age of Onset   Heart disease Mother    Heart disease Father    Lung disease Father    Diabetes Sister    Diabetes Maternal Grandmother    Heart disease Maternal Grandmother    Diabetes Maternal Grandfather    Heart disease Maternal Grandfather    Diabetes Sister      Prior to  Admission medications   Medication Sig Start Date End Date Taking? Authorizing Provider  Cholecalciferol (VITAMIN D3) 1.25 MG (50000 UT) CAPS Take 1 capsule by mouth once a week 05/27/22   Cannady, Corrie DandyJolene T, NP  albuterol (VENTOLIN HFA) 108 (90 Base) MCG/ACT inhaler Inhale 2 puffs into the lungs every 6 (six) hours as needed for wheezing or shortness of breath. 12/11/21   Cannady, Corrie DandyJolene T, NP  albuterol (VENTOLIN HFA) 108 (90 Base) MCG/ACT inhaler Inhale 2 puffs into the lungs every 6 (six) hours as needed for wheezing or shortness of  breath. 10/27/22   Viviano SimasParker, Sarah, FNP  benzonatate (TESSALON) 100 MG capsule Take 1 capsule (100 mg total) by mouth 3 (three) times daily as needed. 10/14/22   Viviano SimasParker, Sarah, FNP  buPROPion (WELLBUTRIN XL) 150 MG 24 hr tablet TAKE ONE TABLET BY MOUTH EVERY DAY 03/17/22   Aura Dialsannady, Jolene T, NP  cetirizine (ZYRTEC) 10 MG tablet Take 1 tablet by mouth once daily 06/12/22   Cannady, Corrie DandyJolene T, NP  cyclobenzaprine (FLEXERIL) 10 MG tablet Take 1 tablet (10 mg total) by mouth at bedtime. 03/17/22   Aura Dialsannady, Jolene T, NP  famotidine (PEPCID) 20 MG tablet One after supper 02/28/22   Cannady, Corrie DandyJolene T, NP  FLUoxetine (PROZAC) 20 MG tablet Take 1 tablet (20 mg total) by mouth daily. 03/17/22   Cannady, Corrie DandyJolene T, NP  fluticasone (FLONASE) 50 MCG/ACT nasal spray Use 2 spray(s) in each nostril once daily 07/31/22   Cannady, Jolene T, NP  fluticasone-salmeterol (ADVAIR) 250-50 MCG/ACT AEPB INHALE 1 PUFF BY MOUTH IN THE MORNING AND 1 AT BEDTIME 10/29/22   Cannady, Jolene T, NP  gabapentin (NEURONTIN) 300 MG capsule Take one capsule (300 MG) by mouth in the morning and at noon, then before bedtime take 2 capsules (600 MG) by mouth. 03/17/22   Cannady, Jolene T, NP  ipratropium-albuterol (DUONEB) 0.5-2.5 (3) MG/3ML SOLN Take 3 mLs by nebulization every 6 (six) hours as needed. 07/12/21   Zannie CoveJoseph, Preetha, MD  levothyroxine (SYNTHROID) 125 MCG tablet Take 1 tablet (125 mcg total) by mouth daily. 03/18/22   Cannady, Corrie DandyJolene T, NP  montelukast (SINGULAIR) 10 MG tablet Take 1 tablet (10 mg total) by mouth at bedtime. 12/11/21   Cannady, Corrie DandyJolene T, NP  mupirocin ointment (BACTROBAN) 2 % Apply 1 application. topically 2 (two) times daily. 03/17/22   Cannady, Corrie DandyJolene T, NP  olopatadine (PATADAY) 0.1 % ophthalmic solution Place 1 drop into both eyes 2 (two) times daily. 03/17/22   Aura Dialsannady, Jolene T, NP  omeprazole (PRILOSEC) 40 MG capsule TAKE ONE CAPSULE BY MOUTH EVERY EVENING 07/18/21   Nyoka CowdenWert, Michael B, MD  promethazine-dextromethorphan  (PROMETHAZINE-DM) 6.25-15 MG/5ML syrup Take 5 mLs by mouth 4 (four) times daily as needed for cough. 10/27/22   Viviano SimasParker, Sarah, FNP  rosuvastatin (CRESTOR) 20 MG tablet TAKE ONE TABLET BY MOUTH EVERY DAY 03/17/22   Cannady, Corrie DandyJolene T, NP  traZODone (DESYREL) 50 MG tablet Take 1 tablet (50 mg total) by mouth at bedtime as needed. 03/17/22   Cannady, Corrie DandyJolene T, NP  triamcinolone ointment (KENALOG) 0.5 % Apply 1 application. topically 2 (two) times daily. 03/17/22   Cannady, Corrie DandyJolene T, NP  vitamin B-12 (CYANOCOBALAMIN) 500 MCG tablet Take 1 tablet (500 mcg total) by mouth daily. 05/15/21   Marjie Skiffannady, Jolene T, NP    Physical Exam: Vitals:   12/04/22 1700 12/04/22 1715 12/04/22 1730 12/04/22 1745  BP: (!) 113/58  119/71   Pulse: (!) 101  99   Resp: 14  16   Temp:      TempSrc:      SpO2: 98% 98%  95%  Weight:      Height:       General: Not in acute distress HEENT:       Eyes: PERRL, EOMI, no scleral icterus.       ENT: No discharge from the ears and nose, no pharynx injection, no tonsillar enlargement.        Neck: No JVD, no bruit, no mass felt. Heme: No neck lymph node enlargement. Cardiac: S1/S2, RRR, No murmurs, No gallops or rubs. Respiratory: Has coarse breathing sound bilaterally. Has wheezing bilaterally GI: Soft, nondistended, nontender, no rebound pain, no organomegaly, BS present. GU: No hematuria Ext: No pitting leg edema bilaterally. 1+DP/PT pulse bilaterally. Musculoskeletal: No joint deformities, No joint redness or warmth, no limitation of ROM in spin. Skin: No rashes.  Neuro: Alert, oriented X3, cranial nerves II-XII grossly intact, moves all extremities normally.  Psych: Patient is not psychotic, no suicidal or hemocidal ideation.  Labs on Admission: I have personally reviewed following labs and imaging studies  CBC: Recent Labs  Lab 12/04/22 1233  WBC 13.7*  NEUTROABS 10.5*  HGB 14.6  HCT 44.8  MCV 93.9  PLT 323   Basic Metabolic Panel: Recent Labs  Lab  12/04/22 1233  NA 137  K 3.4*  CL 109  CO2 22  GLUCOSE 96  BUN 21  CREATININE 1.12*  CALCIUM 8.2*  MG 2.3   GFR: Estimated Creatinine Clearance: 60.6 mL/min (A) (by C-G formula based on SCr of 1.12 mg/dL (H)). Liver Function Tests: Recent Labs  Lab 12/04/22 1233  AST 17  ALT 22  ALKPHOS 41  BILITOT 0.7  PROT 6.5  ALBUMIN 3.5   No results for input(s): "LIPASE", "AMYLASE" in the last 168 hours. No results for input(s): "AMMONIA" in the last 168 hours. Coagulation Profile: No results for input(s): "INR", "PROTIME" in the last 168 hours. Cardiac Enzymes: No results for input(s): "CKTOTAL", "CKMB", "CKMBINDEX", "TROPONINI" in the last 168 hours. BNP (last 3 results) No results for input(s): "PROBNP" in the last 8760 hours. HbA1C: No results for input(s): "HGBA1C" in the last 72 hours. CBG: No results for input(s): "GLUCAP" in the last 168 hours. Lipid Profile: No results for input(s): "CHOL", "HDL", "LDLCALC", "TRIG", "CHOLHDL", "LDLDIRECT" in the last 72 hours. Thyroid Function Tests: No results for input(s): "TSH", "T4TOTAL", "FREET4", "T3FREE", "THYROIDAB" in the last 72 hours. Anemia Panel: No results for input(s): "VITAMINB12", "FOLATE", "FERRITIN", "TIBC", "IRON", "RETICCTPCT" in the last 72 hours. Urine analysis:    Component Value Date/Time   COLORURINE YELLOW (A) 07/03/2021 1456   APPEARANCEUR HAZY (A) 07/03/2021 1456   LABSPEC 1.015 07/03/2021 1456   PHURINE 5.0 07/03/2021 1456   GLUCOSEU NEGATIVE 07/03/2021 1456   HGBUR MODERATE (A) 07/03/2021 1456   BILIRUBINUR NEGATIVE 07/03/2021 1456   KETONESUR 20 (A) 07/03/2021 1456   PROTEINUR NEGATIVE 07/03/2021 1456   NITRITE NEGATIVE 07/03/2021 1456   LEUKOCYTESUR MODERATE (A) 07/03/2021 1456   Sepsis Labs: @LABRCNTIP (procalcitonin:4,lacticidven:4) ) Recent Results (from the past 240 hour(s))  Resp panel by RT-PCR (RSV, Flu A&B, Covid) Anterior Nasal Swab     Status: None   Collection Time: 12/04/22 12:32  PM   Specimen: Anterior Nasal Swab  Result Value Ref Range Status   SARS Coronavirus 2 by RT PCR NEGATIVE NEGATIVE Final    Comment: (NOTE) SARS-CoV-2 target nucleic acids are NOT DETECTED.  The SARS-CoV-2 RNA is generally detectable in  upper respiratory specimens during the acute phase of infection. The lowest concentration of SARS-CoV-2 viral copies this assay can detect is 138 copies/mL. A negative result does not preclude SARS-Cov-2 infection and should not be used as the sole basis for treatment or other patient management decisions. A negative result may occur with  improper specimen collection/handling, submission of specimen other than nasopharyngeal swab, presence of viral mutation(s) within the areas targeted by this assay, and inadequate number of viral copies(<138 copies/mL). A negative result must be combined with clinical observations, patient history, and epidemiological information. The expected result is Negative.  Fact Sheet for Patients:  BloggerCourse.com  Fact Sheet for Healthcare Providers:  SeriousBroker.it  This test is no t yet approved or cleared by the [place] FDA and  has been authorized for detection and/or diagnosis of SARS-CoV-2 by FDA under an Emergency Use Authorization (EUA). This EUA will remain  in effect (meaning this test can be used) for the duration of the COVID-19 declaration under Section 564(b)(1) of the Act, 21 U.S.C.section 360bbb-3(b)(1), unless the authorization is terminated  or revoked sooner.       Influenza A by PCR NEGATIVE NEGATIVE Final   Influenza B by PCR NEGATIVE NEGATIVE Final    Comment: (NOTE) The Xpert Xpress SARS-CoV-2/FLU/RSV plus assay is intended as an aid in the diagnosis of influenza from Nasopharyngeal swab specimens and should not be used as a sole basis for treatment. Nasal washings and aspirates are unacceptable for Xpert Xpress  SARS-CoV-2/FLU/RSV testing.  Fact Sheet for Patients: BloggerCourse.com  Fact Sheet for Healthcare Providers: SeriousBroker.it  This test is not yet approved or cleared by the [place] FDA and has been authorized for detection and/or diagnosis of SARS-CoV-2 by FDA under an Emergency Use Authorization (EUA). This EUA will remain in effect (meaning this test can be used) for the duration of the COVID-19 declaration under Section 564(b)(1) of the Act, 21 U.S.C. section 360bbb-3(b)(1), unless the authorization is terminated or revoked.     Resp Syncytial Virus by PCR NEGATIVE NEGATIVE Final    Comment: (NOTE) Fact Sheet for Patients: BloggerCourse.com  Fact Sheet for Healthcare Providers: SeriousBroker.it  This test is not yet approved or cleared by the [place] FDA and has been authorized for detection and/or diagnosis of SARS-CoV-2 by FDA under an Emergency Use Authorization (EUA). This EUA will remain in effect (meaning this test can be used) for the duration of the COVID-19 declaration under Section 564(b)(1) of the Act, 21 U.S.C. section 360bbb-3(b)(1), unless the authorization is terminated or revoked.  Performed at [facility], [address] [place], [place] [address]      Radiological Exams on Admission: DG Chest 2 View  Result Date: 12/04/2022 CLINICAL DATA:  Cough, shortness of breath, and weakness for the past 2 months. EXAM: CHEST - 2 VIEW COMPARISON:  Chest x-ray dated July 09, 2021. FINDINGS: The heart size and mediastinal contours are within normal limits. Normal pulmonary vascularity. Chronic, mildly coarsened interstitial markings again noted. No focal consolidation, pleural effusion, or pneumothorax. Old right-sided rib fractures again noted. No acute osseous abnormality. IMPRESSION: 1. No acute cardiopulmonary disease. Electronically Signed    By: Obie Dredge M.D.   On: 12/04/2022 13:07      Assessment/Plan Active Problems:   COPD exacerbation (HCC)   Sepsis (HCC)   Chronic diastolic CHF (congestive heart failure) (HCC)   HLD (hyperlipidemia)   Hypothyroidism   Chronic kidney disease, stage 3a (HCC)   Depression   Hypokalemia  Obesity with body mass index of 30.0-39.9   Assessment and Plan:  COPD/asthma exacerbation and sepsis: pt meets criteria for sepsis with WBC 13.7, RR 22, HR 88 --> 104  Lactic acid is normal.  -will place to tele bed for observation -Bronchodilators -Solu-Medrol 40 mg IV bid -Mucinex for cough  -Incentive spirometry -sputum culture -Nasal cannula oxygen as needed to maintain O2 saturation 93% or greater -Doxycycline 100 mg twice daily -IVF: 1L NS -check procalcitonin level  Chronic diastolic CHF (congestive heart failure) (HCC): 2D echo on 07/04/2021 showed EF of 50-55% with grade 1 diastolic dysfunction.  Patient does not have leg edema JVD.  BNP is normal at 25.9.  CHF is compensated. -Watch volume status closely  HLD (hyperlipidemia) -Crestor  Hypothyroidism -Synthroid  Chronic kidney disease, stage 3a (HCC): Stable -f/u with BMP  Depression -Continue home medications  Hypokalemia: Potassium 3.4 -Repleted potassium -Check magnesium level  Obesity with body mass index of 30.0-39.9: BMI 30.90, body weight 89.5 kg -Healthy diet and exercise -Encourage losing weight      DVT ppx: SQ Lovenox  Code Status: Full code  Family Communication: I offered to call her family, but patient states that her daughter knows what is going for her, she states that I do not need to call her family.  Disposition Plan:  Anticipate discharge back to previous environment  Consults called:  none  Admission status and Level of care: Telemetry Medical:   for obs    Dispo: The patient is from: Home              Anticipated d/c is to: Home              Anticipated d/c date is: 1  day              Patient currently is not medically stable to d/c.    Severity of Illness:  The appropriate patient status for this patient is OBSERVATION. Observation status is judged to be reasonable and necessary in order to provide the required intensity of service to ensure the patient's safety. The patient's presenting symptoms, physical exam findings, and initial radiographic and laboratory data in the context of their medical condition is felt to place them at decreased risk for further clinical deterioration. Furthermore, it is anticipated that the patient will be medically stable for discharge from the hospital within 2 midnights of admission.        Date of Service 12/04/2022    Lorretta Harp [facility]   If 7PM-7AM, please contact night-coverage [email or web address] 12/04/2022, 6:39 PM

## 2022-12-05 DIAGNOSIS — J45901 Unspecified asthma with (acute) exacerbation: Secondary | ICD-10-CM | POA: Diagnosis not present

## 2022-12-05 DIAGNOSIS — F324 Major depressive disorder, single episode, in partial remission: Secondary | ICD-10-CM | POA: Diagnosis not present

## 2022-12-05 DIAGNOSIS — E039 Hypothyroidism, unspecified: Secondary | ICD-10-CM | POA: Diagnosis not present

## 2022-12-05 DIAGNOSIS — N1831 Chronic kidney disease, stage 3a: Secondary | ICD-10-CM

## 2022-12-05 DIAGNOSIS — Z87891 Personal history of nicotine dependence: Secondary | ICD-10-CM | POA: Diagnosis not present

## 2022-12-05 DIAGNOSIS — Z79899 Other long term (current) drug therapy: Secondary | ICD-10-CM | POA: Diagnosis not present

## 2022-12-05 DIAGNOSIS — I5032 Chronic diastolic (congestive) heart failure: Secondary | ICD-10-CM | POA: Diagnosis not present

## 2022-12-05 DIAGNOSIS — E876 Hypokalemia: Secondary | ICD-10-CM | POA: Diagnosis not present

## 2022-12-05 DIAGNOSIS — R059 Cough, unspecified: Secondary | ICD-10-CM | POA: Diagnosis not present

## 2022-12-05 DIAGNOSIS — J441 Chronic obstructive pulmonary disease with (acute) exacerbation: Secondary | ICD-10-CM | POA: Diagnosis not present

## 2022-12-05 DIAGNOSIS — E785 Hyperlipidemia, unspecified: Secondary | ICD-10-CM | POA: Diagnosis not present

## 2022-12-05 DIAGNOSIS — Z1152 Encounter for screening for COVID-19: Secondary | ICD-10-CM | POA: Diagnosis not present

## 2022-12-05 LAB — CBC
HCT: 39.5 % (ref 36.0–46.0)
Hemoglobin: 12.8 g/dL (ref 12.0–15.0)
MCH: 30.1 pg (ref 26.0–34.0)
MCHC: 32.4 g/dL (ref 30.0–36.0)
MCV: 92.9 fL (ref 80.0–100.0)
Platelets: 294 10*3/uL (ref 150–400)
RBC: 4.25 MIL/uL (ref 3.87–5.11)
RDW: 13.7 % (ref 11.5–15.5)
WBC: 11.8 10*3/uL — ABNORMAL HIGH (ref 4.0–10.5)
nRBC: 0 % (ref 0.0–0.2)

## 2022-12-05 LAB — BASIC METABOLIC PANEL
Anion gap: 8 (ref 5–15)
BUN: 24 mg/dL — ABNORMAL HIGH (ref 8–23)
CO2: 23 mmol/L (ref 22–32)
Calcium: 8.9 mg/dL (ref 8.9–10.3)
Chloride: 107 mmol/L (ref 98–111)
Creatinine, Ser: 0.93 mg/dL (ref 0.44–1.00)
GFR, Estimated: >60 mL/min (ref >60)
Glucose, Bld: 154 mg/dL — ABNORMAL HIGH (ref 70–99)
Potassium: 4.3 mmol/L (ref 3.5–5.1)
Sodium: 138 mmol/L (ref 135–145)

## 2022-12-05 LAB — HIV ANTIBODY (ROUTINE TESTING W REFLEX): HIV Screen 4th Generation wRfx: NONREACTIVE

## 2022-12-05 MED ORDER — LACTULOSE 10 GM/15ML PO SOLN: 20.0000 g | Freq: Once | ORAL | Status: DC

## 2022-12-05 MED ORDER — KETOROLAC TROMETHAMINE 30 MG/ML IJ SOLN
30.0000 mg | Freq: Once | INTRAMUSCULAR | Status: AC | PRN
  Administered 2022-12-05: 30 mg via INTRAVENOUS
  Filled 2022-12-05: qty 1

## 2022-12-05 MED ORDER — FLUTICASONE-SALMETEROL 250-50 MCG/ACT IN AEPB: INHALATION_SPRAY | RESPIRATORY_TRACT | 0 refills | Status: DC

## 2022-12-05 MED ORDER — SENNOSIDES-DOCUSATE SODIUM 8.6-50 MG PO TABS: 2.0000 | ORAL_TABLET | Freq: Once | ORAL | Status: DC

## 2022-12-05 MED ORDER — BENZONATATE 100 MG PO CAPS: 100.0000 mg | ORAL_CAPSULE | Freq: Three times a day (TID) | ORAL | 0 refills | Status: DC | PRN

## 2022-12-05 MED ORDER — ALBUTEROL SULFATE (2.5 MG/3ML) 0.083% IN NEBU: 2.5000 mg | INHALATION_SOLUTION | RESPIRATORY_TRACT | 0 refills | Status: DC | PRN

## 2022-12-05 MED ORDER — PREDNISONE 10 MG PO TABS: ORAL_TABLET | ORAL | 0 refills | Status: AC

## 2022-12-05 MED ORDER — ALBUTEROL SULFATE HFA 108 (90 BASE) MCG/ACT IN AERS: 2.0000 | INHALATION_SPRAY | Freq: Four times a day (QID) | RESPIRATORY_TRACT | 0 refills | Status: DC | PRN

## 2022-12-05 MED ORDER — PROMETHAZINE-DM 6.25-15 MG/5ML PO SYRP: 5.0000 mL | ORAL_SOLUTION | Freq: Four times a day (QID) | ORAL | 0 refills | Status: DC | PRN

## 2022-12-05 MED ORDER — MONTELUKAST SODIUM 10 MG PO TABS: 10.0000 mg | ORAL_TABLET | Freq: Every day | ORAL | 0 refills | Status: DC

## 2022-12-05 MED ORDER — ADULT MULTIVITAMIN W/MINERALS CH: 1.0000 | ORAL_TABLET | Freq: Every day | ORAL | Status: DC

## 2022-12-05 MED ORDER — FLUTICASONE PROPIONATE 50 MCG/ACT NA SUSP: NASAL | 0 refills | Status: DC

## 2022-12-05 NOTE — Discharge Summary (Addendum)
Physician Discharge Summary   [name] MRN: [id number]  DOB: 06/04/1961   Admit:     Date of Admission: 12/04/2022 Admitted from: home    Discharge: Date of discharge: 12/05/22 Disposition: Home Condition at discharge: good  CODE STATUS: FULL CODE      Discharge Physician: Sunnie Nielsen, DO [facility]     PCP: Marjie Skiff, NP  Recommendations for Outpatient Follow-up:  Follow up with PCP Marjie Skiff, NP in 1-2 weeks. Pt was advised to follow w/ her pulmonologist but she stated she was not interested in seeing that physician anymore  Please follow up on the following pending results: none    Discharge Instructions     Diet - low sodium heart healthy   Complete by: As directed    Increase activity slowly   Complete by: As directed          Discharge Diagnoses: Active Problems:   COPD exacerbation (HCC)   SIRS/Sepsis (HCC) - sepsis ruled out    Chronic diastolic CHF (congestive heart failure) (HCC)   HLD (hyperlipidemia)   Hypothyroidism   Chronic kidney disease, stage 3a (HCC)   Depression   Hypokalemia   Obesity with body mass index of 30.0-39.9       Hospital Course:  Audrey Richard is a 61 y.o. female with medical history significant of COPD, asthma, hyperlipidemia, GERD, hypothyroidism, depression, CKD-3A, obesity obesity BMI 30.9, diastolic CHF, who presents with cough, shortness breath, wheezing. Ongoing 1+ month failed outpatient treatment. Kept overnight observation for asthma exacerbation and concern for SIRS criteria w/ tachycardia and elevated WBC. Procalcitonin negative, DDimer negative, no pneumonia on CXR, no respiratory viral illness on PCR, pt was never O2 dependent. AM WBC improved. Pt stable for discharge to follow outpatient.   Consultants:  none  Procedures: none              Discharge Instructions  Allergies as of 12/05/2022       Reactions   Biaxin [clarithromycin]     Patient reports itching, nausea   Iodine    Iodine blisters skin        Medication List     STOP taking these medications    ipratropium-albuterol 0.5-2.5 (3) MG/3ML Soln Commonly known as: DUONEB   mupirocin ointment 2 % Commonly known as: BACTROBAN       TAKE these medications    albuterol 108 (90 Base) MCG/ACT inhaler Commonly known as: VENTOLIN HFA Inhale 2 puffs into the lungs every 6 (six) hours as needed for wheezing or shortness of breath. What changed: Another medication with the same name was changed. Make sure you understand how and when to take each.   albuterol (2.5 MG/3ML) 0.083% nebulizer solution Commonly known as: PROVENTIL Inhale 3 mLs (2.5 mg total) into the lungs every 2 (two) hours as needed for shortness of breath or wheezing. What changed:  when to take this reasons to take this   benzonatate 100 MG capsule Commonly known as: TESSALON Take 1 capsule (100 mg total) by mouth 3 (three) times daily as needed.   buPROPion 150 MG 24 hr tablet Commonly known as: WELLBUTRIN XL TAKE ONE TABLET BY MOUTH EVERY DAY   cetirizine 10 MG tablet Commonly known as: ZYRTEC Take 1 tablet by mouth once daily   cyclobenzaprine 10 MG tablet Commonly known as: FLEXERIL Take 1 tablet (10 mg total) by mouth at bedtime.   famotidine 20 MG tablet Commonly known as:  Pepcid One after supper   FLUoxetine 20 MG tablet Commonly known as: PROZAC Take 1 tablet (20 mg total) by mouth daily.   fluticasone 50 MCG/ACT nasal spray Commonly known as: FLONASE Use 2 spray(s) in each nostril once daily   fluticasone-salmeterol 250-50 MCG/ACT Aepb Commonly known as: ADVAIR INHALE 1 PUFF BY MOUTH IN THE MORNING AND 1 AT BEDTIME   gabapentin 300 MG capsule Commonly known as: NEURONTIN Take one capsule (300 MG) by mouth in the morning and at noon, then before bedtime take 2 capsules (600 MG) by mouth.   levothyroxine 125 MCG tablet Commonly known as: SYNTHROID Take  1 tablet (125 mcg total) by mouth daily.   montelukast 10 MG tablet Commonly known as: SINGULAIR Take 1 tablet (10 mg total) by mouth at bedtime.   olopatadine 0.1 % ophthalmic solution Commonly known as: Pataday Place 1 drop into both eyes 2 (two) times daily.   omeprazole 40 MG capsule Commonly known as: PRILOSEC TAKE ONE CAPSULE BY MOUTH EVERY EVENING   predniSONE 10 MG tablet Commonly known as: DELTASONE Take 6 tablets (60 mg total) by mouth daily with breakfast for 1 day, THEN 5 tablets (50 mg total) daily with breakfast for 1 day, THEN 4 tablets (40 mg total) daily with breakfast for 1 day, THEN 3 tablets (30 mg total) daily with breakfast for 1 day, THEN 2 tablets (20 mg total) daily with breakfast for 1 day, THEN 1 tablet (10 mg total) daily with breakfast for 1 day. Start taking on: December 05, 2022 What changed: See the new instructions.   promethazine-dextromethorphan 6.25-15 MG/5ML syrup Commonly known as: PROMETHAZINE-DM Take 5 mLs by mouth 4 (four) times daily as needed for cough.   rosuvastatin 20 MG tablet Commonly known as: CRESTOR TAKE ONE TABLET BY MOUTH EVERY DAY   traZODone 50 MG tablet Commonly known as: DESYREL Take 1 tablet (50 mg total) by mouth at bedtime as needed.   triamcinolone ointment 0.5 % Commonly known as: KENALOG Apply 1 application. topically 2 (two) times daily.   vitamin B-12 500 MCG tablet Commonly known as: CYANOCOBALAMIN Take 1 tablet (500 mcg total) by mouth daily.   Vitamin D3 1.25 MG (50000 UT) Caps Take 1 capsule by mouth once a week         Follow-up Information     Nyoka CowdenWert, Michael B, MD. Schedule an appointment as soon as possible for a visit on 01/02/2023.   Specialty: Pulmonary Disease Why: @ 4pm Contact information: [address] [place] [id number] [phone]                 Allergies  Allergen Reactions   Biaxin [Clarithromycin]     Patient reports itching, nausea   Iodine     Iodine  blisters skin     Subjective: pt reports mild headache and chronic constipation this morning. SOB improved   Discharge Exam: BP 123/68 (BP Location: Left Arm)   Pulse 94   Temp 97.8 F (36.6 C)   Resp 18   Ht 5\' 7"  (1.702 m)   Wt 89.5 kg   LMP  (LMP Unknown)   SpO2 93%   BMI 30.90 kg/m  General: Pt is alert, awake, not in acute distress Cardiovascular: RRR, S1/S2 +, no rubs, no gallops Respiratory: CTA bilaterally, no wheezing, no rhonchi Abdominal: Soft, NT, ND, bowel sounds + Extremities: no edema, no cyanosis     The results of significant diagnostics from this hospitalization (including imaging, microbiology, ancillary  and laboratory) are listed below for reference.     Microbiology: Recent Results (from the past 240 hour(s))  Resp panel by RT-PCR (RSV, Flu A&B, Covid) Anterior Nasal Swab     Status: None   Collection Time: 12/04/22 12:32 PM   Specimen: Anterior Nasal Swab  Result Value Ref Range Status   SARS Coronavirus 2 by RT PCR NEGATIVE NEGATIVE Final    Comment: (NOTE) SARS-CoV-2 target nucleic acids are NOT DETECTED.  The SARS-CoV-2 RNA is generally detectable in upper respiratory specimens during the acute phase of infection. The lowest concentration of SARS-CoV-2 viral copies this assay can detect is 138 copies/mL. A negative result does not preclude SARS-Cov-2 infection and should not be used as the sole basis for treatment or other patient management decisions. A negative result may occur with  improper specimen collection/handling, submission of specimen other than nasopharyngeal swab, presence of viral mutation(s) within the areas targeted by this assay, and inadequate number of viral copies(<138 copies/mL). A negative result must be combined with clinical observations, patient history, and epidemiological information. The expected result is Negative.  Fact Sheet for Patients:  BloggerCourse.com  Fact Sheet for  Healthcare Providers:  SeriousBroker.it  This test is no t yet approved or cleared by the [place] FDA and  has been authorized for detection and/or diagnosis of SARS-CoV-2 by FDA under an Emergency Use Authorization (EUA). This EUA will remain  in effect (meaning this test can be used) for the duration of the COVID-19 declaration under Section 564(b)(1) of the Act, 21 U.S.C.section 360bbb-3(b)(1), unless the authorization is terminated  or revoked sooner.       Influenza A by PCR NEGATIVE NEGATIVE Final   Influenza B by PCR NEGATIVE NEGATIVE Final    Comment: (NOTE) The Xpert Xpress SARS-CoV-2/FLU/RSV plus assay is intended as an aid in the diagnosis of influenza from Nasopharyngeal swab specimens and should not be used as a sole basis for treatment. Nasal washings and aspirates are unacceptable for Xpert Xpress SARS-CoV-2/FLU/RSV testing.  Fact Sheet for Patients: BloggerCourse.com  Fact Sheet for Healthcare Providers: SeriousBroker.it  This test is not yet approved or cleared by the [place] FDA and has been authorized for detection and/or diagnosis of SARS-CoV-2 by FDA under an Emergency Use Authorization (EUA). This EUA will remain in effect (meaning this test can be used) for the duration of the COVID-19 declaration under Section 564(b)(1) of the Act, 21 U.S.C. section 360bbb-3(b)(1), unless the authorization is terminated or revoked.     Resp Syncytial Virus by PCR NEGATIVE NEGATIVE Final    Comment: (NOTE) Fact Sheet for Patients: BloggerCourse.com  Fact Sheet for Healthcare Providers: SeriousBroker.it  This test is not yet approved or cleared by the [place] FDA and has been authorized for detection and/or diagnosis of SARS-CoV-2 by FDA under an Emergency Use Authorization (EUA). This EUA will remain in effect (meaning this  test can be used) for the duration of the COVID-19 declaration under Section 564(b)(1) of the Act, 21 U.S.C. section 360bbb-3(b)(1), unless the authorization is terminated or revoked.  Performed at [facility], [address] [place], [place] [address]      Labs: BNP (last 3 results) Recent Labs    12/04/22 1233  BNP 25.9   Basic Metabolic Panel: Recent Labs  Lab 12/04/22 1233 12/05/22 0516  NA 137 138  K 3.4* 4.3  CL 109 107  CO2 22 23  GLUCOSE 96 154*  BUN 21 24*  CREATININE 1.12* 0.93  CALCIUM  8.2* 8.9  MG 2.3  --    Liver Function Tests: Recent Labs  Lab 12/04/22 1233  AST 17  ALT 22  ALKPHOS 41  BILITOT 0.7  PROT 6.5  ALBUMIN 3.5   No results for input(s): "LIPASE", "AMYLASE" in the last 168 hours. No results for input(s): "AMMONIA" in the last 168 hours. CBC: Recent Labs  Lab 12/04/22 1233 12/05/22 0516  WBC 13.7* 11.8*  NEUTROABS 10.5*  --   HGB 14.6 12.8  HCT 44.8 39.5  MCV 93.9 92.9  PLT 323 294   Cardiac Enzymes: No results for input(s): "CKTOTAL", "CKMB", "CKMBINDEX", "TROPONINI" in the last 168 hours. BNP: Invalid input(s): "POCBNP" CBG: No results for input(s): "GLUCAP" in the last 168 hours. D-Dimer Recent Labs    12/04/22 1342  DDIMER <0.27   Hgb A1c No results for input(s): "HGBA1C" in the last 72 hours. Lipid Profile No results for input(s): "CHOL", "HDL", "LDLCALC", "TRIG", "CHOLHDL", "LDLDIRECT" in the last 72 hours. Thyroid function studies No results for input(s): "TSH", "T4TOTAL", "T3FREE", "THYROIDAB" in the last 72 hours.  Invalid input(s): "FREET3" Anemia work up No results for input(s): "VITAMINB12", "FOLATE", "FERRITIN", "TIBC", "IRON", "RETICCTPCT" in the last 72 hours. Urinalysis    Component Value Date/Time   COLORURINE YELLOW (A) 07/03/2021 1456   APPEARANCEUR HAZY (A) 07/03/2021 1456   LABSPEC 1.015 07/03/2021 1456   PHURINE 5.0 07/03/2021 1456   GLUCOSEU NEGATIVE 07/03/2021 1456   HGBUR  MODERATE (A) 07/03/2021 1456   BILIRUBINUR NEGATIVE 07/03/2021 1456   KETONESUR 20 (A) 07/03/2021 1456   PROTEINUR NEGATIVE 07/03/2021 1456   NITRITE NEGATIVE 07/03/2021 1456   LEUKOCYTESUR MODERATE (A) 07/03/2021 1456   Sepsis Labs Recent Labs  Lab 12/04/22 1233 12/05/22 0516  WBC 13.7* 11.8*   Microbiology Recent Results (from the past 240 hour(s))  Resp panel by RT-PCR (RSV, Flu A&B, Covid) Anterior Nasal Swab     Status: None   Collection Time: 12/04/22 12:32 PM   Specimen: Anterior Nasal Swab  Result Value Ref Range Status   SARS Coronavirus 2 by RT PCR NEGATIVE NEGATIVE Final    Comment: (NOTE) SARS-CoV-2 target nucleic acids are NOT DETECTED.  The SARS-CoV-2 RNA is generally detectable in upper respiratory specimens during the acute phase of infection. The lowest concentration of SARS-CoV-2 viral copies this assay can detect is 138 copies/mL. A negative result does not preclude SARS-Cov-2 infection and should not be used as the sole basis for treatment or other patient management decisions. A negative result may occur with  improper specimen collection/handling, submission of specimen other than nasopharyngeal swab, presence of viral mutation(s) within the areas targeted by this assay, and inadequate number of viral copies(<138 copies/mL). A negative result must be combined with clinical observations, patient history, and epidemiological information. The expected result is Negative.  Fact Sheet for Patients:  BloggerCourse.com  Fact Sheet for Healthcare Providers:  SeriousBroker.it  This test is no t yet approved or cleared by the [place] FDA and  has been authorized for detection and/or diagnosis of SARS-CoV-2 by FDA under an Emergency Use Authorization (EUA). This EUA will remain  in effect (meaning this test can be used) for the duration of the COVID-19 declaration under Section 564(b)(1) of the Act,  21 U.S.C.section 360bbb-3(b)(1), unless the authorization is terminated  or revoked sooner.       Influenza A by PCR NEGATIVE NEGATIVE Final   Influenza B by PCR NEGATIVE NEGATIVE Final    Comment: (NOTE) The Xpert Xpress SARS-CoV-2/FLU/RSV  plus assay is intended as an aid in the diagnosis of influenza from Nasopharyngeal swab specimens and should not be used as a sole basis for treatment. Nasal washings and aspirates are unacceptable for Xpert Xpress SARS-CoV-2/FLU/RSV testing.  Fact Sheet for Patients: BloggerCourse.com  Fact Sheet for Healthcare Providers: SeriousBroker.it  This test is not yet approved or cleared by the [place] FDA and has been authorized for detection and/or diagnosis of SARS-CoV-2 by FDA under an Emergency Use Authorization (EUA). This EUA will remain in effect (meaning this test can be used) for the duration of the COVID-19 declaration under Section 564(b)(1) of the Act, 21 U.S.C. section 360bbb-3(b)(1), unless the authorization is terminated or revoked.     Resp Syncytial Virus by PCR NEGATIVE NEGATIVE Final    Comment: (NOTE) Fact Sheet for Patients: BloggerCourse.com  Fact Sheet for Healthcare Providers: SeriousBroker.it  This test is not yet approved or cleared by the [place] FDA and has been authorized for detection and/or diagnosis of SARS-CoV-2 by FDA under an Emergency Use Authorization (EUA). This EUA will remain in effect (meaning this test can be used) for the duration of the COVID-19 declaration under Section 564(b)(1) of the Act, 21 U.S.C. section 360bbb-3(b)(1), unless the authorization is terminated or revoked.  Performed at [facility], [address] [place], [place] [address]    Imaging DG Chest 2 View  Result Date: 12/04/2022 CLINICAL DATA:  Cough, shortness of breath, and weakness for the past 2  months. EXAM: CHEST - 2 VIEW COMPARISON:  Chest x-ray dated July 09, 2021. FINDINGS: The heart size and mediastinal contours are within normal limits. Normal pulmonary vascularity. Chronic, mildly coarsened interstitial markings again noted. No focal consolidation, pleural effusion, or pneumothorax. Old right-sided rib fractures again noted. No acute osseous abnormality. IMPRESSION: 1. No acute cardiopulmonary disease. Electronically Signed   By: Obie Dredge M.D.   On: 12/04/2022 13:07      Time coordinating discharge: over 30 minutes  SIGNED:  Sunnie Nielsen DO [facility]

## 2022-12-05 NOTE — Progress Notes (Signed)
Initial Nutrition Assessment  DOCUMENTATION CODES:   Obesity unspecified  INTERVENTION:   -Liberalize diet to regular -MVI with minerals daily -Magic cup TID with meals, each supplement provides 290 kcal and 9 grams of protein  -Pt complains of constipation; sent message to RN and MD via secure chat to discuss bowel regimen  NUTRITION DIAGNOSIS:   Inadequate oral intake related to poor appetite as evidenced by per patient/family report, meal completion < 25%.  GOAL:   Patient will meet greater than or equal to 90% of their needs  MONITOR:   PO intake, Supplement acceptance  REASON FOR ASSESSMENT:   Malnutrition Screening Tool    ASSESSMENT:   Pt with medical history significant of COPD, asthma, hyperlipidemia, GERD, hypothyroidism, depression, CKD-3A, obesity obesity BMI 30.9, diastolic CHF, who presents with cough, shortness breath, wheezing.  Pt admitted with COPD exacerbation.   Reviewed I/O's: +1 L x 24 hours  Spoke with pt at bedside, who reports feeling poorly today. She shares she does not have much of an appetite and hasn't for about 3 weeks. Per pt, she is "trying to make it work" but feel very uncomfortable secondary to constipation. Pt shares that her constipation is often difficult to manage, but has not had a BM in about 5 days. She has had "very small BMs", but still feels very stopped up. Per pt, she was given miralax, which did not help. Observed breakfast tray- pt consumed only about 25% of her breakfast.   Pt states she does not think she has lost weight secondary to constipation. Noted wt has been stable over the past 5 months.   Discussed importance of good meal and supplement intake to promote healing.   Reached out to RN and MD via secure chat to discuss bowel regimen.   Medications reviewed and include vitamin B-12, lactulose, solu-medrol, and senokot.   Labs reviewed.     NUTRITION - FOCUSED PHYSICAL EXAM:  Flowsheet Row Most Recent Value   Orbital Region No depletion  Upper Arm Region No depletion  Thoracic and Lumbar Region No depletion  Buccal Region No depletion  Temple Region No depletion  Clavicle Bone Region No depletion  Clavicle and Acromion Bone Region No depletion  Scapular Bone Region No depletion  Dorsal Hand No depletion  Patellar Region No depletion  Anterior Thigh Region No depletion  Posterior Calf Region No depletion  Edema (RD Assessment) Mild  Hair Reviewed  Eyes Reviewed  Mouth Reviewed  Skin Reviewed  Nails Reviewed       Diet Order:   Diet Order             Diet regular Room service appropriate? Yes; Fluid consistency: Thin  Diet effective now           Diet - low sodium heart healthy                   EDUCATION NEEDS:   Education needs have been addressed  Skin:  Skin Assessment: Reviewed RN Assessment  Last BM:  12/01/22  Height:   Ht Readings from Last 1 Encounters:  12/04/22 5\' 7"  (1.702 m)    Weight:   Wt Readings from Last 1 Encounters:  12/04/22 89.5 kg    Ideal Body Weight:  61.4 kg  BMI:  Body mass index is 30.9 kg/m.  Estimated Nutritional Needs:   Kcal:  1850-2050  Protein:  90-105 grams  Fluid:  > 1.8 L    12/06/22, RD, LDN, CDCES Registered  Dietitian II Certified Diabetes Care and Education Specialist Please refer to [facility] for RD and/or RD on-call/weekend/after hours pager

## 2022-12-15 NOTE — Patient Instructions (Incomplete)

## 2022-12-17 ENCOUNTER — Ambulatory Visit: Payer: BC Managed Care – PPO | Admitting: Nurse Practitioner

## 2022-12-17 ENCOUNTER — Encounter: Payer: Self-pay | Admitting: Nurse Practitioner

## 2022-12-17 VITALS — BP 128/83 | HR 93 | Temp 98.2°F | Ht 67.01 in | Wt 205.6 lb

## 2022-12-17 DIAGNOSIS — N1831 Chronic kidney disease, stage 3a: Secondary | ICD-10-CM | POA: Diagnosis not present

## 2022-12-17 DIAGNOSIS — E782 Mixed hyperlipidemia: Secondary | ICD-10-CM | POA: Diagnosis not present

## 2022-12-17 DIAGNOSIS — R7309 Other abnormal glucose: Secondary | ICD-10-CM

## 2022-12-17 DIAGNOSIS — A419 Sepsis, unspecified organism: Secondary | ICD-10-CM

## 2022-12-17 DIAGNOSIS — J441 Chronic obstructive pulmonary disease with (acute) exacerbation: Secondary | ICD-10-CM

## 2022-12-17 DIAGNOSIS — I5032 Chronic diastolic (congestive) heart failure: Secondary | ICD-10-CM

## 2022-12-17 DIAGNOSIS — E559 Vitamin D deficiency, unspecified: Secondary | ICD-10-CM

## 2022-12-17 DIAGNOSIS — E538 Deficiency of other specified B group vitamins: Secondary | ICD-10-CM

## 2022-12-17 DIAGNOSIS — Z23 Encounter for immunization: Secondary | ICD-10-CM

## 2022-12-17 DIAGNOSIS — J4489 Other specified chronic obstructive pulmonary disease: Secondary | ICD-10-CM

## 2022-12-17 DIAGNOSIS — I700 Atherosclerosis of aorta: Secondary | ICD-10-CM | POA: Diagnosis not present

## 2022-12-17 DIAGNOSIS — R058 Other specified cough: Secondary | ICD-10-CM

## 2022-12-17 DIAGNOSIS — F324 Major depressive disorder, single episode, in partial remission: Secondary | ICD-10-CM

## 2022-12-17 DIAGNOSIS — E063 Autoimmune thyroiditis: Secondary | ICD-10-CM

## 2022-12-17 MED ORDER — BREZTRI AEROSPHERE 160-9-4.8 MCG/ACT IN AERO: 2.0000 | INHALATION_SPRAY | Freq: Two times a day (BID) | RESPIRATORY_TRACT | 11 refills | Status: DC

## 2022-12-17 MED ORDER — CETIRIZINE HCL 10 MG PO TABS: 10.0000 mg | ORAL_TABLET | Freq: Every day | ORAL | 4 refills | Status: DC

## 2022-12-17 MED ORDER — IPRATROPIUM-ALBUTEROL 0.5-2.5 (3) MG/3ML IN SOLN
3.0000 mL | Freq: Once | RESPIRATORY_TRACT | Status: AC
  Administered 2022-12-17: 3 mL via RESPIRATORY_TRACT

## 2022-12-17 MED ORDER — FLUTICASONE PROPIONATE 50 MCG/ACT NA SUSP: NASAL | 4 refills | Status: DC

## 2022-12-17 NOTE — Assessment & Plan Note (Signed)
Acute, improving.  At this time will trial changing her Advair to [facility], as she does have improved symptoms after using Duoneb at home and in office (may benefit from triple therapy).  Samples provided in office to trial for the next couple weeks, then will have return and order if improved breathing noted.  Continue Albuterol PRN and nebs at home.  Would benefit return to pulmonary, but refuses at this time.

## 2022-12-17 NOTE — Assessment & Plan Note (Signed)
Noted on hospital notes.  She is having more SBE, would benefit repeat echo -- discussed with patient and will order this to further assess heart function.  Last echo July 2022 with EF 50-55%.  Recommend: - Reminded to call for an overnight weight gain of >2 pounds or a weekly weight gain of >5 pounds - not adding salt to food and read food labels. Reviewed the importance of keeping daily sodium intake to 2000mg  daily.  - Avoid Ibuprofen products

## 2022-12-17 NOTE — Assessment & Plan Note (Addendum)
Ongoing, stable.  Recheck labs today.  Recommend good hydration at home.

## 2022-12-17 NOTE — Assessment & Plan Note (Signed)
Acute and improving with COPD exacerbation -- will recheck labs today.

## 2022-12-17 NOTE — Assessment & Plan Note (Signed)
Chronic, ongoing.  Continue on supplement and adjust as needed. Check level today.

## 2022-12-17 NOTE — Assessment & Plan Note (Signed)
Chronic, ongoing.   At this time will trial changing her Advair to [facility], as she does have improved symptoms after using Duoneb at home and in office (may benefit from triple therapy).  Samples provided in office to trial for the next couple weeks, then will have return and order if improved breathing noted.  Continue Albuterol PRN and nebs at home.  Would benefit return to pulmonary and ENT, but refuses at this time.

## 2022-12-17 NOTE — Assessment & Plan Note (Signed)
Chronic, ongoing -- some exacerbation due to recent illness.  Denies SI/HI.  At this time continue Wellbutrin and Prozac + Trazodone as needed for sleep.  Could consider in future discontinuation of Prozac and change to Duloxetine which may also benefit her chronic pain, along with mood.  Return in 6 months for mood.

## 2022-12-17 NOTE — Assessment & Plan Note (Signed)
Chronic.  Noted on imaging 12/30/2020 -- continue statin therapy. 

## 2022-12-17 NOTE — Assessment & Plan Note (Signed)
Ongoing, will continue collaboration with ENT and pulmonary.  Appreciate their input, recent labs and notes reviewed.   

## 2022-12-17 NOTE — Assessment & Plan Note (Signed)
Chronic, ongoing.  Continue current medication regimen and adjust as needed. Lipid panel today. 

## 2022-12-17 NOTE — Assessment & Plan Note (Signed)
A1c check today due to past elevations, initiate medication as needed.

## 2022-12-17 NOTE — Assessment & Plan Note (Addendum)
Chronic, ongoing.  Continue on supplement and adjust as needed. Check level next visit.

## 2022-12-17 NOTE — Progress Notes (Signed)
BP 128/83   Pulse 93   Temp 98.2 F (36.8 C) (Oral)   Ht 5' 7.01" (1.702 m)   Wt 205 lb 9.6 oz (93.3 kg)   LMP  (LMP Unknown)   SpO2 95%   BMI 32.19 kg/m    Subjective:    Patient ID: Audrey Richard, female    DOB: Oct 02, 1961, 61 y.o.   MRN: [id number]  HPI: Audrey Richard is a 61 y.o. female  Chief Complaint  Patient presents with   Hospitalization Follow-up    SOB,COPD Exacerbation. Was in ER on 12/04/22. Patient still has cough   Transition of Care Hospital Follow up.  Was admitted to hospital on 12/04/22 for COPD exacerbation, CHF, sepsis, CKD.  She has been seen at urgent care prior that day and they recommended ER visit -- had been treated with Levofloxacin and Prednisone on 11/24/22.  She is followed by pulmonary, but has not seen since 08/06/21 and she reports they told her it was not her lungs.  She did see ENT for hoarseness, chronic -- last visit 10/08/21.  At this time she does not want to return to either due to current time off work.  She was diagnosed in past with upper airway cough syndrome and asthmatic bronchitis.   She is currently using Advair twice a day and Albuterol -- uses Albuterol twice a day.  Continues to have a cough, but this has improved.  Does continue with shortness of breath at baseline, walking from car to office can be difficult.  Last echo on 07/04/21 noted EF 50-55%, left ventricle with low normal function and Grade I diastolic dysfunction.  Right ventricle was normal.  At night she does sleep in her bed, sleeps propped up on two pillows, this has been baseline for long while.  If lays flat she starts coughing.  Denies edema in legs or abdomen.    She reports now feeling like she is trying to get sinus infection -- sinuses more stuffed up and drainage.  "Hospital Course: Audrey Richard is a 61 y.o. female with medical history significant of COPD, asthma, hyperlipidemia, GERD, hypothyroidism, depression, CKD-3A, obesity obesity BMI 30.9,  diastolic CHF, who presents with cough, shortness breath, wheezing. Ongoing 1+ month failed outpatient treatment. Kept overnight observation for asthma exacerbation and concern for SIRS criteria w/ tachycardia and elevated WBC. Procalcitonin negative, DDimer negative, no pneumonia on CXR, no respiratory viral illness on PCR, pt was never O2 dependent. AM WBC improved. Pt stable for discharge to follow outpatient."  Hospital/Facility: [facility] D/C Physician: Dr. Lyn Hollingshead D/C Date: 12/05/22  Records Requested: 12/17/22 Records Received: 12/17/22 Records Reviewed: 12/17/22  Diagnoses on Discharge: COPD exacerbation  Date of interactive Contact within 48 hours of discharge:  Contact was through:  no calls  Date of 7 day or 14 day face-to-face visit:    within 14 days     12/17/2022    2:59 PM 03/17/2022    3:17 PM 12/11/2021    3:54 PM 06/18/2021    3:07 PM 03/06/2021   11:16 AM  Depression screen PHQ 2/9  Decreased Interest 3 2 3 1  0  Down, Depressed, Hopeless 2 1 1 2 1   PHQ - 2 Score 5 3 4 3 1   Altered sleeping 3 3 2 3  0  Tired, decreased energy 3 3   1   Change in appetite 2 1 3 2 3   Feeling bad or failure about yourself  2 1 1  0 0  Trouble concentrating  1 0 0 0 3  Moving slowly or fidgety/restless 2 1 0 0 0  Suicidal thoughts 1 0 0 0 0  PHQ-9 Score 19 12 10 8 8   Difficult doing work/chores Not difficult at all  Somewhat difficult Somewhat difficult Somewhat difficult       12/17/2022    3:00 PM 03/17/2022    3:18 PM 12/11/2021    3:54 PM 03/06/2021   11:17 AM  GAD 7 : Generalized Anxiety Score  Nervous, Anxious, on Edge 2 2 1 2   Control/stop worrying 3 2 2 2   Worry too much - different things 3 2 2 2   Trouble relaxing 2 1 1 1   Restless 1 2 0 1  Easily annoyed or irritable 2 1 1  0  Afraid - awful might happen 1 1 0 0  Total GAD 7 Score 14 11 7 8   Anxiety Difficulty Somewhat difficult Very difficult Somewhat difficult Somewhat difficult     Outpatient Encounter  Medications as of 12/17/2022  Medication Sig   albuterol (PROVENTIL) (2.5 MG/3ML) 0.083% nebulizer solution Inhale 3 mLs (2.5 mg total) into the lungs every 2 (two) hours as needed for shortness of breath or wheezing.   albuterol (VENTOLIN HFA) 108 (90 Base) MCG/ACT inhaler Inhale 2 puffs into the lungs every 6 (six) hours as needed for wheezing or shortness of breath.   Budeson-Glycopyrrol-Formoterol (BREZTRI AEROSPHERE) 160-9-4.8 MCG/ACT AERO Inhale 2 puffs into the lungs 2 (two) times daily.   buPROPion (WELLBUTRIN XL) 150 MG 24 hr tablet TAKE ONE TABLET BY MOUTH EVERY DAY   Cholecalciferol (VITAMIN D3) 1.25 MG (50000 UT) CAPS Take 1 capsule by mouth once a week   cyclobenzaprine (FLEXERIL) 10 MG tablet Take 1 tablet (10 mg total) by mouth at bedtime.   famotidine (PEPCID) 20 MG tablet One after supper   FLUoxetine (PROZAC) 20 MG tablet Take 1 tablet (20 mg total) by mouth daily.   gabapentin (NEURONTIN) 300 MG capsule Take one capsule (300 MG) by mouth in the morning and at noon, then before bedtime take 2 capsules (600 MG) by mouth.   levothyroxine (SYNTHROID) 125 MCG tablet Take 1 tablet (125 mcg total) by mouth daily.   montelukast (SINGULAIR) 10 MG tablet Take 1 tablet (10 mg total) by mouth at bedtime.   olopatadine (PATADAY) 0.1 % ophthalmic solution Place 1 drop into both eyes 2 (two) times daily.   omeprazole (PRILOSEC) 40 MG capsule TAKE ONE CAPSULE BY MOUTH EVERY EVENING   rosuvastatin (CRESTOR) 20 MG tablet TAKE ONE TABLET BY MOUTH EVERY DAY   traZODone (DESYREL) 50 MG tablet Take 1 tablet (50 mg total) by mouth at bedtime as needed.   triamcinolone ointment (KENALOG) 0.5 % Apply 1 application. topically 2 (two) times daily.   vitamin B-12 (CYANOCOBALAMIN) 500 MCG tablet Take 1 tablet (500 mcg total) by mouth daily.   [DISCONTINUED] benzonatate (TESSALON) 100 MG capsule Take 1 capsule (100 mg total) by mouth 3 (three) times daily as needed.   [DISCONTINUED] cetirizine (ZYRTEC) 10  MG tablet Take 1 tablet by mouth once daily   [DISCONTINUED] fluticasone (FLONASE) 50 MCG/ACT nasal spray Use 2 spray(s) in each nostril once daily   [DISCONTINUED] fluticasone-salmeterol (ADVAIR) 250-50 MCG/ACT AEPB INHALE 1 PUFF BY MOUTH IN THE MORNING AND 1 AT BEDTIME   [DISCONTINUED] promethazine-dextromethorphan (PROMETHAZINE-DM) 6.25-15 MG/5ML syrup Take 5 mLs by mouth 4 (four) times daily as needed for cough.   cetirizine (ZYRTEC) 10 MG tablet Take 1 tablet (10 mg total) by mouth  daily.   fluticasone (FLONASE) 50 MCG/ACT nasal spray Use 2 spray(s) in each nostril once daily   [EXPIRED] ipratropium-albuterol (DUONEB) 0.5-2.5 (3) MG/3ML nebulizer solution 3 mL    No facility-administered encounter medications on file as of 12/17/2022.   Diagnostic Tests Reviewed/Disposition: as on chart and reviewed  Consults: none  Discharge Instructions: Follow-up with PCP And pulmonary  Disease/illness Education: Reviewed with patient today  Home Health/Community Services Discussions/Referrals: none  Establishment or re-establishment of referral orders for community resources: none  Discussion with other health care providers: Reviewed all recent notes  Assessment and Support of treatment regimen adherence: Reviewed with patient today  Appointments Coordinated with: Reviewed with patient today  Education for self-management, independent living, and ADLs:  Reviewed with patient today  Relevant past medical, surgical, family and social history reviewed and updated as indicated. Interim medical history since our last visit reviewed. Allergies and medications reviewed and updated.  Review of Systems  Constitutional:  Negative for activity change, appetite change, diaphoresis, fatigue and fever.  HENT:  Positive for postnasal drip and rhinorrhea. Negative for congestion, ear discharge, ear pain, sinus pressure, sinus pain, sneezing, sore throat and voice change.   Respiratory:  Positive for  cough, shortness of breath and wheezing. Negative for chest tightness.   Cardiovascular:  Negative for chest pain, palpitations and leg swelling.  Gastrointestinal: Negative.   Endocrine: Negative for cold intolerance and heat intolerance.  Neurological: Negative.   Psychiatric/Behavioral: Negative.     Per HPI unless specifically indicated above     Objective:    BP 128/83   Pulse 93   Temp 98.2 F (36.8 C) (Oral)   Ht 5' 7.01" (1.702 m)   Wt 205 lb 9.6 oz (93.3 kg)   LMP  (LMP Unknown)   SpO2 95%   BMI 32.19 kg/m   Wt Readings from Last 3 Encounters:  12/17/22 205 lb 9.6 oz (93.3 kg)  12/04/22 197 lb 5 oz (89.5 kg)  03/17/22 197 lb 6.4 oz (89.5 kg)    Physical Exam Vitals and nursing note reviewed.  Constitutional:      General: She is awake. She is not in acute distress.    Appearance: She is well-developed and well-groomed. She is obese. She is not ill-appearing or toxic-appearing.  HENT:     Head: Normocephalic.     Comments: Hoarseness per baseline noted.    Right Ear: Hearing, tympanic membrane, ear canal and external ear normal. No middle ear effusion.     Left Ear: Hearing, tympanic membrane, ear canal and external ear normal.  No middle ear effusion.     Nose: No rhinorrhea.     Right Sinus: No maxillary sinus tenderness or frontal sinus tenderness.     Left Sinus: No maxillary sinus tenderness or frontal sinus tenderness.     Mouth/Throat:     Lips: Pink.     Mouth: Mucous membranes are moist.     Pharynx: Posterior oropharyngeal erythema (with cobblestoning) present. No pharyngeal swelling or oropharyngeal exudate.  Eyes:     General: Lids are normal.        Right eye: No discharge.        Left eye: No discharge.     Conjunctiva/sclera: Conjunctivae normal.     Pupils: Pupils are equal, round, and reactive to light.  Neck:     Thyroid: No thyromegaly.     Vascular: No carotid bruit.  Cardiovascular:     Rate and Rhythm: Normal rate and regular rhythm.  Heart sounds: Normal heart sounds. No murmur heard.    No gallop.  Pulmonary:     Effort: Pulmonary effort is normal. No accessory muscle usage or respiratory distress.     Breath sounds: Wheezing present. No decreased breath sounds or rhonchi.     Comments: Expiratory wheezes noted throughout intermittent. Provided Duoneb in office and lung sounds clear with this, decreased wheezing. Abdominal:     General: Bowel sounds are normal.     Palpations: Abdomen is soft.  Musculoskeletal:     Cervical back: Normal range of motion and neck supple.     Right lower leg: No edema.     Left lower leg: No edema.  Lymphadenopathy:     Cervical: No cervical adenopathy.  Skin:    General: Skin is warm and dry.  Neurological:     Mental Status: She is alert and oriented to person, place, and time.     Deep Tendon Reflexes: Reflexes are normal and symmetric.     Reflex Scores:      Brachioradialis reflexes are 2+ on the right side and 2+ on the left side.      Patellar reflexes are 2+ on the right side and 2+ on the left side. Psychiatric:        Attention and Perception: Attention normal.        Mood and Affect: Mood normal.        Speech: Speech normal.        Behavior: Behavior normal. Behavior is cooperative.        Thought Content: Thought content normal.    Results for orders placed or performed during the hospital encounter of 12/04/22  Resp panel by RT-PCR (RSV, Flu A&B, Covid) Anterior Nasal Swab   Specimen: Anterior Nasal Swab  Result Value Ref Range   SARS Coronavirus 2 by RT PCR NEGATIVE NEGATIVE   Influenza A by PCR NEGATIVE NEGATIVE   Influenza B by PCR NEGATIVE NEGATIVE   Resp Syncytial Virus by PCR NEGATIVE NEGATIVE  Comprehensive metabolic panel  Result Value Ref Range   Sodium 137 135 - 145 mmol/L   Potassium 3.4 (L) 3.5 - 5.1 mmol/L   Chloride 109 98 - 111 mmol/L   CO2 22 22 - 32 mmol/L   Glucose, Bld 96 70 - 99 mg/dL   BUN 21 8 - 23 mg/dL   Creatinine, Ser 0.34  (H) 0.44 - 1.00 mg/dL   Calcium 8.2 (L) 8.9 - 10.3 mg/dL   Total Protein 6.5 6.5 - 8.1 g/dL   Albumin 3.5 3.5 - 5.0 g/dL   AST 17 15 - 41 U/L   ALT 22 0 - 44 U/L   Alkaline Phosphatase 41 38 - 126 U/L   Total Bilirubin 0.7 0.3 - 1.2 mg/dL   GFR, Estimated 56 (L) >60 mL/min   Anion gap 6 5 - 15  CBC with Differential  Result Value Ref Range   WBC 13.7 (H) 4.0 - 10.5 K/uL   RBC 4.77 3.87 - 5.11 MIL/uL   Hemoglobin 14.6 12.0 - 15.0 g/dL   HCT 74.2 59.5 - 63.8 %   MCV 93.9 80.0 - 100.0 fL   MCH 30.6 26.0 - 34.0 pg   MCHC 32.6 30.0 - 36.0 g/dL   RDW 75.6 43.3 - 29.5 %   Platelets 323 150 - 400 K/uL   nRBC 0.0 0.0 - 0.2 %   Neutrophils Relative % 77 %   Neutro Abs 10.5 (H) 1.7 - 7.7 K/uL  Lymphocytes Relative 14 %   Lymphs Abs 1.9 0.7 - 4.0 K/uL   Monocytes Relative 6 %   Monocytes Absolute 0.8 0.1 - 1.0 K/uL   Eosinophils Relative 1 %   Eosinophils Absolute 0.2 0.0 - 0.5 K/uL   Basophils Relative 0 %   Basophils Absolute 0.1 0.0 - 0.1 K/uL   Immature Granulocytes 2 %   Abs Immature Granulocytes 0.29 (H) 0.00 - 0.07 K/uL  D-dimer, quantitative  Result Value Ref Range   D-Dimer, Quant <0.27 0.00 - 0.50 ug/mL-FEU  Brain natriuretic peptide  Result Value Ref Range   B Natriuretic Peptide 25.9 0.0 - 100.0 pg/mL  Magnesium  Result Value Ref Range   Magnesium 2.3 1.7 - 2.4 mg/dL  Lactic acid, plasma  Result Value Ref Range   Lactic Acid, Venous 1.9 0.5 - 1.9 mmol/L  Procalcitonin  Result Value Ref Range   Procalcitonin <0.10 ng/mL  Basic metabolic panel  Result Value Ref Range   Sodium 138 135 - 145 mmol/L   Potassium 4.3 3.5 - 5.1 mmol/L   Chloride 107 98 - 111 mmol/L   CO2 23 22 - 32 mmol/L   Glucose, Bld 154 (H) 70 - 99 mg/dL   BUN 24 (H) 8 - 23 mg/dL   Creatinine, Ser 9.60 0.44 - 1.00 mg/dL   Calcium 8.9 8.9 - 45.4 mg/dL   GFR, Estimated >09 >81 mL/min   Anion gap 8 5 - 15  CBC  Result Value Ref Range   WBC 11.8 (H) 4.0 - 10.5 K/uL   RBC 4.25 3.87 - 5.11  MIL/uL   Hemoglobin 12.8 12.0 - 15.0 g/dL   HCT 19.1 47.8 - 29.5 %   MCV 92.9 80.0 - 100.0 fL   MCH 30.1 26.0 - 34.0 pg   MCHC 32.4 30.0 - 36.0 g/dL   RDW 62.1 30.8 - 65.7 %   Platelets 294 150 - 400 K/uL   nRBC 0.0 0.0 - 0.2 %  HIV Antibody (routine testing w rflx)  Result Value Ref Range   HIV Screen 4th Generation wRfx Non Reactive Non Reactive  Troponin I (High Sensitivity)  Result Value Ref Range   Troponin I (High Sensitivity) 2 <18 ng/L      Assessment & Plan:   Problem List Items Addressed This Visit       Cardiovascular and Mediastinum   Aortic atherosclerosis (HCC)    Chronic.  Noted on imaging 12/30/2020 -- continue statin therapy.      Relevant Orders   Comprehensive metabolic panel   Lipid Panel w/o Chol/HDL Ratio   Chronic diastolic CHF (congestive heart failure) (HCC)    Noted on hospital notes.  She is having more SBE, would benefit repeat echo -- discussed with patient and will order this to further assess heart function.  Last echo July 2022 with EF 50-55%.  Recommend: - Reminded to call for an overnight weight gain of >2 pounds or a weekly weight gain of >5 pounds - not adding salt to food and read food labels. Reviewed the importance of keeping daily sodium intake to 2000mg  daily.  - Avoid Ibuprofen products      Relevant Orders   Comprehensive metabolic panel   ECHOCARDIOGRAM COMPLETE     Respiratory   Asthmatic bronchitis , chronic    Chronic, ongoing.   At this time will trial changing her Advair to [facility], as she does have improved symptoms after using Duoneb at home and in office (may benefit from triple therapy).  Samples provided in office to trial for the next couple weeks, then will have return and order if improved breathing noted.  Continue Albuterol PRN and nebs at home.  Would benefit return to pulmonary and ENT, but refuses at this time.        Relevant Medications   Budeson-Glycopyrrol-Formoterol (BREZTRI AEROSPHERE) 160-9-4.8 MCG/ACT  AERO   COPD exacerbation (HCC) - Primary    Acute, improving.  At this time [facility], as she does have improved symptoms after using Duoneb at home and in office (may benefit from triple therapy).  Samples provided in office to trial for the next couple weeks, then will have return and order if improved breathing noted.  Continue Albuterol PRN and nebs at home.  Would benefit return to pulmonary, but refuses at this time.        Relevant Medications   cetirizine (ZYRTEC) 10 MG tablet   fluticasone (FLONASE) 50 MCG/ACT nasal spray   Budeson-Glycopyrrol-Formoterol (BREZTRI AEROSPHERE) 160-9-4.8 MCG/ACT AERO   Other Relevant Orders   Comprehensive metabolic panel   CBC with Differential/Platelet   ECHOCARDIOGRAM COMPLETE     Genitourinary   Chronic kidney disease, stage 3a (HCC)    Ongoing, stable.  Recheck labs today.  Recommend good hydration at home.      Relevant Orders   Comprehensive metabolic panel   CBC with Differential/Platelet     Other   B12 deficiency    Chronic, ongoing.  Continue on supplement and adjust as needed. Check level today.      Relevant Orders   Vitamin B12   Depression    Chronic, ongoing -- some exacerbation due to recent illness.  Denies SI/HI.  At this time continue Wellbutrin and Prozac + Trazodone as needed for sleep.  Could consider in future discontinuation of Prozac and change to Duloxetine which may also benefit her chronic pain, along with mood.  Return in 6 months for mood.      Elevated hemoglobin A1c    A1c check today due to past elevations, initiate medication as needed.      Relevant Orders   HgB A1c   Mixed hyperlipidemia    Chronic, ongoing.  Continue current medication regimen and adjust as needed.  Lipid panel today.      Relevant Orders   Comprehensive metabolic panel   Lipid Panel w/o Chol/HDL Ratio   Sepsis (HCC)    Acute and improving with COPD exacerbation -- will recheck labs today.       Relevant Orders   Comprehensive metabolic panel   CBC with Differential/Platelet   Upper airway cough syndrome    Ongoing, will continue collaboration with ENT and pulmonary.  Appreciate their input, recent labs and notes reviewed.        Relevant Medications   cetirizine (ZYRTEC) 10 MG tablet   fluticasone (FLONASE) 50 MCG/ACT nasal spray   Vitamin D deficiency    Chronic, ongoing.  Continue on supplement and adjust as needed. Check level next visit.        Follow up plan: Return in about 2 weeks (around 12/31/2022) for Lung check -- Breztri added.

## 2022-12-18 LAB — CBC WITH DIFFERENTIAL/PLATELET
Basophils Absolute: 0 10*3/uL (ref 0.0–0.2)
Basos: 1 %
EOS (ABSOLUTE): 0.4 10*3/uL (ref 0.0–0.4)
Eos: 6 %
Hematocrit: 41.3 % (ref 34.0–46.6)
Hemoglobin: 13.9 g/dL (ref 11.1–15.9)
Immature Grans (Abs): 0 10*3/uL (ref 0.0–0.1)
Immature Granulocytes: 0 %
Lymphocytes Absolute: 0.6 10*3/uL — ABNORMAL LOW (ref 0.7–3.1)
Lymphs: 9 %
MCH: 31 pg (ref 26.6–33.0)
MCHC: 33.7 g/dL (ref 31.5–35.7)
MCV: 92 fL (ref 79–97)
Monocytes Absolute: 0.5 10*3/uL (ref 0.1–0.9)
Monocytes: 7 %
Neutrophils Absolute: 4.9 10*3/uL (ref 1.4–7.0)
Neutrophils: 77 %
Platelets: 225 10*3/uL (ref 150–450)
RBC: 4.48 x10E6/uL (ref 3.77–5.28)
RDW: 13.1 % (ref 11.7–15.4)
WBC: 6.3 10*3/uL (ref 3.4–10.8)

## 2022-12-18 LAB — LIPID PANEL W/O CHOL/HDL RATIO
Cholesterol, Total: 178 mg/dL (ref 100–199)
HDL: 65 mg/dL (ref >39)
LDL Chol Calc (NIH): 92 mg/dL (ref 0–99)
Triglycerides: 118 mg/dL (ref 0–149)
VLDL Cholesterol Cal: 21 mg/dL (ref 5–40)

## 2022-12-18 LAB — COMPREHENSIVE METABOLIC PANEL
ALT: 21 IU/L (ref 0–32)
AST: 16 IU/L (ref 0–40)
Albumin/Globulin Ratio: 2.2 (ref 1.2–2.2)
Albumin: 4.3 g/dL (ref 3.9–4.9)
Alkaline Phosphatase: 62 IU/L (ref 44–121)
BUN/Creatinine Ratio: 10 — ABNORMAL LOW (ref 12–28)
BUN: 12 mg/dL (ref 8–27)
Bilirubin Total: 0.6 mg/dL (ref 0.0–1.2)
CO2: 23 mmol/L (ref 20–29)
Calcium: 8.9 mg/dL (ref 8.7–10.3)
Chloride: 100 mmol/L (ref 96–106)
Creatinine, Ser: 1.15 mg/dL — ABNORMAL HIGH (ref 0.57–1.00)
Globulin, Total: 2 g/dL (ref 1.5–4.5)
Glucose: 84 mg/dL (ref 70–99)
Potassium: 3.9 mmol/L (ref 3.5–5.2)
Sodium: 139 mmol/L (ref 134–144)
Total Protein: 6.3 g/dL (ref 6.0–8.5)
eGFR: 54 mL/min/{1.73_m2} — ABNORMAL LOW (ref >59)

## 2022-12-18 LAB — HEMOGLOBIN A1C
Est. average glucose Bld gHb Est-mCnc: 126 mg/dL
Hgb A1c MFr Bld: 6 % — ABNORMAL HIGH (ref 4.8–5.6)

## 2022-12-18 LAB — VITAMIN B12: Vitamin B-12: 509 pg/mL (ref 232–1245)

## 2022-12-18 NOTE — Progress Notes (Signed)
Contacted via [place] afternoon Audrey Richard, your labs have returned: - Kidney function, creatinine and eGFR, continues to show some mild stage 3 kidney disease we will continue to monitor.  AST and ALT, liver testing is normal. - A1c, continues to show some prediabetes at 6%.  Goal is to keep this from trending up to 6.5%, which is diabetic level.  Continue to focus on diet and exercise. - Remainder of labs stable.  Any questions? Keep being amazing!!  Thank you for allowing me to participate in your care.  I appreciate you. Kindest regards, Diannie Willner

## 2023-01-02 ENCOUNTER — Inpatient Hospital Stay: Payer: BC Managed Care – PPO | Admitting: Internal Medicine

## 2023-01-02 NOTE — Progress Notes (Deleted)
Audrey Richard, female    DOB: [birth year],    MRN: [id number]   Brief patient profile:  37 yowf quit smoking 2006 with no real chronic or recurrent resp problems until around 2008 and 2016 (hurt ribs from coughing) with dx of asthma s/p admit 11/14/18  referred to pulmonary clinic 07/18/2021 for COPD eval s/p admit:   Admit date: 07/03/2021 Discharge date: 07/12/2021  Discharge Diagnoses:    COPD exacerbation ([place])    Depression   Insomnia   Stage 2 moderate COPD by GOLD classification ([place])   Hypothyroidism   Mixed hyperlipidemia   Acid reflux     Filed Weights    07/03/21 2137  Weight: 87.4 kg      History of present illness:  Audrey Richard is a 62 y.o. female with PMH significant for COPD, mixed hyperlipidemia, depression, anxiety, hypothyroid, GERD, neuropathy. Patient presented to the ED on 7/13 with complaint of shortness of breath, ongoing for the last month, associated with cough, dark thick sputum, not responding to outpatient treatment by PCP with prednisone, inhaler, [facility] Course:    Acute exacerbation of COPD Acute respiratory failure with hypoxia Coughing spells -Presented with a month of shortness of breath, productive cough   -Treated for severe COPD exacerbation, using IV steroids, duo nebs, azithromycin -Clinically improving but was bothered by cough, improved significantly on regimen of steroids, Tessalon Perles, Tussionex, Claritin, Flonase  -Weaned off O2, still continues to complain of intermittent cough, clinically felt to be stable for discharge home, sent referral to Pulmonary   UTI -Complained of dysuria, urinalysis showed moderate leukocytes.  Completed 3-day course of IV Rocephin.   Chronic  diastolic CHF -Echocardiogram 7/14 showed EF of 50 to 55% no regional wall motion abnormality, grade 1 diastolic dysfunction, normal right ventricular size and systolic function and nl LA, RA    GERD -PPI    History of Present  Illness  07/18/2021  Pulmonary/ 1st office eval/ Audrey Richard / [facility]  Chief Complaint  Patient presents with   Consult    COPD- [facility] F/U couhging, wheezing, sob. Lost voice at her last admission 07/03/2021  Dyspnea:  best days can barely make it to back of [employer] where she works since 2019  Cough: all the time minimal production mucoid/ not aware of nasal drainage / no better with tessalon Sleep: wakes up / flat bed  SABA use: not helping Rec Gabapentin 300 mg  build up to 4 x daily until return  Prednisone 10 mg take  4 each am x 2 days,   2 each am x 2 days,  1 each am x 2 days and stop  Stop all inhalers and just use duoneb up to every 4 hours if needed for breathing  For  cough >>  mucinex dm 1200 mg  twice daily and use the flutter valve as much as you can and if you can't stop coughing ok to take tylenol #3 every 4 hours  Try prilosec (omeprazole)  35m  Take 30-60 min before first meal of the day and Pepcid ac (famotidine) 20 mg one after supper until return  GERD diet reviewed, bed blocks rec  Please schedule a follow up office visit in 2 weeks, sooner if needed  with all medications /inhalers/ solutions in hand so we can verify exactly what you are taking. This includes all medications from all doctors and over the counters     [facility]20[facility]  [facility]  [facility] did not bring [facility]s / confused with details of care, names of meds Chief Complaint  Patient presents with   Follow-up  On prednisone reduced the duoneb twice daily and no more since d/c pred Dyspnea:  MMRC2 = can't walk a nl pace on a flat grade s sob but does fine slow and flat  Cough: minimal yellowish in am Sleeping: p waking to pee notes cough SABA use: just duoneb  02: none  Covid status:   none / had omicron  Rec    01/02/2023 post hosp  f/u ov/Audrey Richard/ [facility] re: ***   maint on ***  No chief complaint on file.   Dyspnea:  *** Cough: *** Sleeping: *** SABA use: *** 02: *** Covid  status:   ***   No obvious day to day or daytime variability or assoc excess/ purulent sputum or mucus plugs or hemoptysis or cp or chest tightness, subjective wheeze or overt sinus or hb symptoms.   *** without nocturnal  or early am exacerbation  of respiratory  c/o's or need for noct saba. Also denies any obvious fluctuation of symptoms with weather or environmental changes or other aggravating or alleviating factors except as outlined above   No unusual exposure hx or h/o childhood pna/ asthma or knowledge of premature birth.  Current Allergies, Complete Past Medical History, Past Surgical History, Family History, and Social History were reviewed in [employer] record.  ROS  The following are not active complaints unless bolded Hoarseness, sore throat, dysphagia, dental problems, itching, sneezing,  nasal congestion or discharge of excess mucus or purulent secretions, ear ache,   fever, chills, sweats, unintended wt loss or wt gain, classically pleuritic or exertional cp,  orthopnea pnd or arm/hand swelling  or leg swelling, presyncope, palpitations, abdominal pain, anorexia, nausea, vomiting, diarrhea  or change in bowel habits or change in bladder habits, change in stools or change in urine, dysuria, hematuria,  rash, arthralgias, visual complaints, headache, numbness, weakness or ataxia or problems with walking or coordination,  change in mood or  memory.        No outpatient medications have been marked as taking for the 01/02/23 encounter (Appointment) with Tanda Rockers, MD.              Past Medical History:  Diagnosis Date   Asthma    COPD (chronic obstructive pulmonary disease) ([place])    Depression    High cholesterol    Osteoporosis    Thyroid disease         Objective:     01/02/2023        ***  08/06/21 197 lb (89.4 kg)  07/18/21 192 lb 3.2 oz (87.2 kg)  07/17/21 192 lb 9.6 oz (87.4 kg)    Vital signs reviewed  01/02/2023  - Note at rest  02 sats  ***% on ***   General appearance:    ***        Assessment

## 2023-01-03 NOTE — Patient Instructions (Signed)
Living with COPD Being diagnosed with chronic obstructive pulmonary disease (COPD) changes your life physically and emotionally. Having COPD can affect your ability to work and do things you enjoy. COPD is not the same for everyone, and it may change over time. Your health care providers can help you come up with the COPD management plan that works best for you. How to manage lifestyle changes Treatment plan Work closely with your health care providers. Follow your COPD management plan. This plan includes: Instructions about activities, exercises, diet, medicines, what to do when COPD flares up, and when to call your health care provider. A pulmonary rehabilitation program. In pulmonary rehab, you will learn about COPD, do exercises for fitness and breathing, and get support from health care providers and other people who have COPD. Managing emotions and stress Living with a chronic disease means you may also struggle with stressful emotions, such as sadness, fear, and worry. Here are some ways to manage these emotions: Talk to someone about your fear, anxiety, depression, or stress. Learn strategies to avoid or reduce stress and ask for help if you are struggling with depression or anxiety. Consider joining a COPD support group, online or in person.  Adjusting to changes COPD may limit the things you can do, but you can make certain changes to help you cope with the diagnosis. Ask for help when you need it. Getting support from friends, family, and your health care team is an important part of managing the condition. Try to get regular exercise as prescribed by a health care provider or pulmonary rehab team. Exercising can help COPD, even if you are a bit short of breath. Take steps to prevent infection and protect your lungs: Wash your hands often and avoid being in crowds. Stay away from friends and family members who are sick. Check your local air quality each day, and stay out of areas  where air pollution is likely. How to recognize changes in your condition Recognizing changes in your COPD COPD is a progressive disease. It is important to let the health care team know if your COPD is getting worse. Your treatment plan may need to change. Watch for: Increased shortness of breath, wheezing, cough, or fatigue. Loss of ability to exercise or perform daily activities, like climbing stairs. More frequent symptom flares. Signs of depression or anxiety. Recognizing stress It is normal to have additional stress when you have COPD. However, prolonged stress and anxiety can make COPD worse and lead to depression. Recognize the warning signs, which include: Feeling sad or worried more often or most of the time. Having less energy and losing interest in pleasurable activities. Changes in your appetite or sleeping patterns. Being easily angered or irritated. Having unexplained aches and pains, digestive problems, or headaches. Follow these instructions at home: Eating and drinking  Eat foods that are high in fiber, such as fresh fruits and vegetables, whole grains, and beans. Limit foods that are high in fat and processed sugars, such as fried or sweet foods. Follow a balanced diet and maintain a healthy weight. Being overweight or underweight can make COPD worse. You may work with a dietitian as part of your pulmonary rehab program. Drink enough fluid to keep your urine pale yellow. If you drink alcohol: Limit how much you have to: 0-1 drink a day for women who are not pregnant. 0-2 drinks a day for men. Know how much alcohol is in your drink. In the [place] one drink equals one 12 oz bottle   of beer (355 mL), one 5 oz glass of wine (148 mL), or one 1 oz glass of hard liquor (44 mL). Lifestyle If you smoke, the most important thing that you can do is to stop smoking. Continuing to smoke will cause the disease to progress faster. Do not use any products that contain nicotine or  tobacco. These products include cigarettes, chewing tobacco, and vaping devices, such as e-cigarettes. If you need help quitting, ask your health care provider. Avoid exposure to things that irritate your lungs, such as smoke, chemicals, and fumes. Activity Balance exercise and rest. Take short walks every 1-2 hours. This is important to improve blood flow and breathing. Ask for help if you feel weak or unsteady. Do exercises that include controlled breathing with body movement, such as tai chi. General instructions Take over-the-counter and prescription medicines only as told by your health care provider. Take vitamin and protein supplements as told by your health care provider or dietitian. Practice good oral hygiene and see your dental care provider regularly. An oral infection can also spread to your lungs. Make sure you receive all the vaccines that your health care provider recommends. Keep all follow-up visits. This is important. Contact a health care provider if you: Are struggling to manage your COPD. Have emotional stress that interferes with your ability to cope with COPD. Get help right away if you: Have thoughts of suicide, death, or hurting yourself or others. If you ever feel like you may hurt yourself or others, or have thoughts about taking your own life, get help right away. Go to your nearest emergency department or: Call your local emergency services (911 in the [place] Call a suicide crisis helpline, such as the National Suicide Prevention Lifeline at 1-800-273-8255 or 988 in the [place] This is open 24 hours a day in the [place] Text the Crisis Text Line at 741741 (in the [place] Summary Being diagnosed with chronic obstructive pulmonary disease (COPD) changes your life physically and emotionally. Work with your health care providers and follow your COPD management plan. A pulmonary rehabilitation program is an important part of COPD management. Prolonged stress, anxiety, and  depression can make COPD worse. Let your health care provider know if emotional stress interferes with your ability to cope with and manage COPD. This information is not intended to replace advice given to you by your health care provider. Make sure you discuss any questions you have with your health care provider. Document Revised: 07/03/2021 Document Reviewed: 12/26/2020 Elsevier Patient Education  2023 Elsevier Inc.  

## 2023-01-05 ENCOUNTER — Encounter: Payer: Self-pay | Admitting: Nurse Practitioner

## 2023-01-05 ENCOUNTER — Ambulatory Visit (INDEPENDENT_AMBULATORY_CARE_PROVIDER_SITE_OTHER): Payer: BC Managed Care – PPO | Admitting: Nurse Practitioner

## 2023-01-05 VITALS — BP 118/77 | HR 80 | Temp 98.3°F | Ht 67.01 in | Wt 205.5 lb

## 2023-01-05 DIAGNOSIS — I700 Atherosclerosis of aorta: Secondary | ICD-10-CM

## 2023-01-05 DIAGNOSIS — R058 Other specified cough: Secondary | ICD-10-CM | POA: Diagnosis not present

## 2023-01-05 DIAGNOSIS — I5032 Chronic diastolic (congestive) heart failure: Secondary | ICD-10-CM | POA: Diagnosis not present

## 2023-01-05 DIAGNOSIS — Z23 Encounter for immunization: Secondary | ICD-10-CM | POA: Diagnosis not present

## 2023-01-05 DIAGNOSIS — E782 Mixed hyperlipidemia: Secondary | ICD-10-CM

## 2023-01-05 DIAGNOSIS — J4489 Other specified chronic obstructive pulmonary disease: Secondary | ICD-10-CM | POA: Diagnosis not present

## 2023-01-05 DIAGNOSIS — M5441 Lumbago with sciatica, right side: Secondary | ICD-10-CM

## 2023-01-05 DIAGNOSIS — F324 Major depressive disorder, single episode, in partial remission: Secondary | ICD-10-CM

## 2023-01-05 DIAGNOSIS — G8929 Other chronic pain: Secondary | ICD-10-CM

## 2023-01-05 DIAGNOSIS — M5442 Lumbago with sciatica, left side: Secondary | ICD-10-CM

## 2023-01-05 MED ORDER — VITAMIN B-12 500 MCG PO TABS: 500.0000 ug | ORAL_TABLET | Freq: Every day | ORAL | 4 refills | Status: DC

## 2023-01-05 MED ORDER — CETIRIZINE HCL 10 MG PO TABS: 10.0000 mg | ORAL_TABLET | Freq: Every day | ORAL | 4 refills | Status: DC

## 2023-01-05 MED ORDER — FAMOTIDINE 20 MG PO TABS: ORAL_TABLET | ORAL | 11 refills | Status: DC

## 2023-01-05 MED ORDER — ALBUTEROL SULFATE HFA 108 (90 BASE) MCG/ACT IN AERS: 2.0000 | INHALATION_SPRAY | Freq: Four times a day (QID) | RESPIRATORY_TRACT | 4 refills | Status: DC | PRN

## 2023-01-05 MED ORDER — TRAZODONE HCL 50 MG PO TABS: 50.0000 mg | ORAL_TABLET | Freq: Every evening | ORAL | 5 refills | Status: DC | PRN

## 2023-01-05 MED ORDER — LEVOTHYROXINE SODIUM 125 MCG PO TABS: 125.0000 ug | ORAL_TABLET | Freq: Every day | ORAL | 4 refills | Status: DC

## 2023-01-05 MED ORDER — OLOPATADINE HCL 0.1 % OP SOLN: 1.0000 [drp] | Freq: Two times a day (BID) | OPHTHALMIC | 12 refills | Status: DC

## 2023-01-05 MED ORDER — GABAPENTIN 300 MG PO CAPS: ORAL_CAPSULE | ORAL | 4 refills | Status: DC

## 2023-01-05 MED ORDER — BREZTRI AEROSPHERE 160-9-4.8 MCG/ACT IN AERO: 2.0000 | INHALATION_SPRAY | Freq: Two times a day (BID) | RESPIRATORY_TRACT | 11 refills | Status: DC

## 2023-01-05 MED ORDER — ROSUVASTATIN CALCIUM 20 MG PO TABS: ORAL_TABLET | Freq: Every day | ORAL | 4 refills | Status: DC

## 2023-01-05 MED ORDER — ALBUTEROL SULFATE (2.5 MG/3ML) 0.083% IN NEBU: 2.5000 mg | INHALATION_SOLUTION | RESPIRATORY_TRACT | 5 refills | Status: AC | PRN

## 2023-01-05 MED ORDER — FLUOXETINE HCL 20 MG PO TABS: 20.0000 mg | ORAL_TABLET | Freq: Every day | ORAL | 4 refills | Status: DC

## 2023-01-05 MED ORDER — FLUTICASONE PROPIONATE 50 MCG/ACT NA SUSP: NASAL | 4 refills | Status: DC

## 2023-01-05 MED ORDER — MONTELUKAST SODIUM 10 MG PO TABS: 10.0000 mg | ORAL_TABLET | Freq: Every day | ORAL | 4 refills | Status: DC

## 2023-01-05 MED ORDER — BUPROPION HCL ER (XL) 150 MG PO TB24: ORAL_TABLET | Freq: Every day | ORAL | 4 refills | Status: DC

## 2023-01-05 MED ORDER — VITAMIN D3 1.25 MG (50000 UT) PO CAPS: 1.0000 | ORAL_CAPSULE | ORAL | 4 refills | Status: DC

## 2023-01-05 NOTE — Assessment & Plan Note (Signed)
Chronic.  Noted on imaging 12/30/2020 -- continue statin therapy.

## 2023-01-05 NOTE — Assessment & Plan Note (Signed)
Noted on hospital notes.  Repeat echo ordered last visit.  Last echo July 2022 with EF 50-55%.  Recommend: - Reminded to call for an overnight weight gain of >2 pounds or a weekly weight gain of >5 pounds - not adding salt to food and read food labels. Reviewed the importance of keeping daily sodium intake to 2000mg  daily.  - Avoid Ibuprofen products

## 2023-01-05 NOTE — Assessment & Plan Note (Signed)
Chronic, ongoing.  She has improved symptoms with Judithann Sauger on board, will maintain this and work on cost help for it.  Sample provided in office and coupon card today.  Continue Albuterol PRN and nebs at home.  Would benefit return to pulmonary and ENT, but refuses at this time.

## 2023-01-05 NOTE — Assessment & Plan Note (Signed)
Ongoing, will continue collaboration with ENT and pulmonary.  Appreciate their input, recent labs and notes reviewed.

## 2023-01-05 NOTE — Progress Notes (Signed)
BP 118/77   Pulse 80   Temp 98.3 F (36.8 C) (Oral)   Ht 5' 7.01" (1.702 m)   Wt 205 lb 8 oz (93.2 kg)   LMP  (LMP Unknown)   SpO2 96%   BMI 32.18 kg/m    Subjective:    Patient ID: Audrey Richard, female    DOB: [birth year], 62 y.o.   MRN: [id number]  HPI: Audrey Richard is a 62 y.o. female  Chief Complaint  Patient presents with   Lung Check    Was started on Breztri at last visit   ASTHMA BRONCHITIS Followed by pulmonary in past, with last visit 08/06/21 and ENT last saw 10/08/21 due to upper airway cough syndrome.  She did go through speech therapy in past.  Recent exacerbation on 12/04/22.  Started her back on [facility] 12/17/22.  She reports feeling better, does not notice she is coughing as much.   She feels Breztri does good.   Last time she had echo was 2022 with EF 50-55%.   COPD status: stable Satisfied with current treatment?: yes Oxygen use: no Dyspnea frequency: occasional, improving Cough frequency: improving Rescue inhaler frequency: none recently since starting Breztri Limitation of activity: no Productive cough: none Last Spirometry: 2022 Pneumovax: Up to Date Influenza: Up to Date   Relevant past medical, surgical, family and social history reviewed and updated as indicated. Interim medical history since our last visit reviewed. Allergies and medications reviewed and updated.  Review of Systems  Constitutional:  Negative for activity change, appetite change, diaphoresis, fatigue and fever.  Respiratory:  Positive for cough (improved). Negative for chest tightness, shortness of breath and wheezing.   Cardiovascular:  Negative for chest pain, palpitations and leg swelling.  Neurological: Negative.   Psychiatric/Behavioral: Negative.      Per HPI unless specifically indicated above     Objective:    BP 118/77   Pulse 80   Temp 98.3 F (36.8 C) (Oral)   Ht 5' 7.01" (1.702 m)   Wt 205 lb 8 oz (93.2 kg)   LMP  (LMP Unknown)   SpO2 96%    BMI 32.18 kg/m   Wt Readings from Last 3 Encounters:  01/05/23 205 lb 8 oz (93.2 kg)  12/17/22 205 lb 9.6 oz (93.3 kg)  12/04/22 197 lb 5 oz (89.5 kg)    Physical Exam Vitals and nursing note reviewed.  Constitutional:      General: She is awake. She is not in acute distress.    Appearance: She is well-developed and well-groomed. She is obese. She is not ill-appearing or toxic-appearing.  HENT:     Head: Normocephalic.     Comments: Mild hoarseness, improved from past visit.    Right Ear: Hearing normal.     Left Ear: Hearing normal.  Eyes:     General: Lids are normal.        Right eye: No discharge.        Left eye: No discharge.     Conjunctiva/sclera: Conjunctivae normal.     Pupils: Pupils are equal, round, and reactive to light.  Neck:     Thyroid: No thyromegaly.     Vascular: No carotid bruit.  Cardiovascular:     Rate and Rhythm: Normal rate and regular rhythm.     Heart sounds: Normal heart sounds. No murmur heard.    No gallop.  Pulmonary:     Effort: Pulmonary effort is normal. No accessory muscle usage or respiratory distress.  Breath sounds: Normal breath sounds. No decreased breath sounds, wheezing or rhonchi.  Abdominal:     General: Bowel sounds are normal.     Palpations: Abdomen is soft.  Musculoskeletal:     Cervical back: Normal range of motion and neck supple.     Right lower leg: No edema.     Left lower leg: No edema.  Lymphadenopathy:     Cervical: No cervical adenopathy.  Skin:    General: Skin is warm and dry.  Neurological:     Mental Status: She is alert and oriented to person, place, and time.     Deep Tendon Reflexes: Reflexes are normal and symmetric.     Reflex Scores:      Brachioradialis reflexes are 2+ on the right side and 2+ on the left side.      Patellar reflexes are 2+ on the right side and 2+ on the left side. Psychiatric:        Attention and Perception: Attention normal.        Mood and Affect: Mood normal.         Speech: Speech normal.        Behavior: Behavior normal. Behavior is cooperative.        Thought Content: Thought content normal.     Results for orders placed or performed in visit on 12/17/22  Comprehensive metabolic panel  Result Value Ref Range   Glucose 84 70 - 99 mg/dL   BUN 12 8 - 27 mg/dL   Creatinine, Ser 1.15 (H) 0.57 - 1.00 mg/dL   eGFR 54 (L) >59 mL/min/1.73   BUN/Creatinine Ratio 10 (L) 12 - 28   Sodium 139 134 - 144 mmol/L   Potassium 3.9 3.5 - 5.2 mmol/L   Chloride 100 96 - 106 mmol/L   CO2 23 20 - 29 mmol/L   Calcium 8.9 8.7 - 10.3 mg/dL   Total Protein 6.3 6.0 - 8.5 g/dL   Albumin 4.3 3.9 - 4.9 g/dL   Globulin, Total 2.0 1.5 - 4.5 g/dL   Albumin/Globulin Ratio 2.2 1.2 - 2.2   Bilirubin Total 0.6 0.0 - 1.2 mg/dL   Alkaline Phosphatase 62 44 - 121 IU/L   AST 16 0 - 40 IU/L   ALT 21 0 - 32 IU/L  Vitamin B12  Result Value Ref Range   Vitamin B-12 509 232 - 1,245 pg/mL  Lipid Panel w/o Chol/HDL Ratio  Result Value Ref Range   Cholesterol, Total 178 100 - 199 mg/dL   Triglycerides 118 0 - 149 mg/dL   HDL 65 >39 mg/dL   VLDL Cholesterol Cal 21 5 - 40 mg/dL   LDL Chol Calc (NIH) 92 0 - 99 mg/dL  HgB A1c  Result Value Ref Range   Hgb A1c MFr Bld 6.0 (H) 4.8 - 5.6 %   Est. average glucose Bld gHb Est-mCnc 126 mg/dL  CBC with Differential/Platelet  Result Value Ref Range   WBC 6.3 3.4 - 10.8 x10E3/uL   RBC 4.48 3.77 - 5.28 x10E6/uL   Hemoglobin 13.9 11.1 - 15.9 g/dL   Hematocrit 41.3 34.0 - 46.6 %   MCV 92 79 - 97 fL   MCH 31.0 26.6 - 33.0 pg   MCHC 33.7 31.5 - 35.7 g/dL   RDW 13.1 11.7 - 15.4 %   Platelets 225 150 - 450 x10E3/uL   Neutrophils 77 Not Estab. %   Lymphs 9 Not Estab. %   Monocytes 7 Not Estab. %   Eos  6 Not Estab. %   Basos 1 Not Estab. %   Neutrophils Absolute 4.9 1.4 - 7.0 x10E3/uL   Lymphocytes Absolute 0.6 (L) 0.7 - 3.1 x10E3/uL   Monocytes Absolute 0.5 0.1 - 0.9 x10E3/uL   EOS (ABSOLUTE) 0.4 0.0 - 0.4 x10E3/uL   Basophils Absolute  0.0 0.0 - 0.2 x10E3/uL   Immature Granulocytes 0 Not Estab. %   Immature Grans (Abs) 0.0 0.0 - 0.1 x10E3/uL      Assessment & Plan:   Problem List Items Addressed This Visit       Cardiovascular and Mediastinum   Aortic atherosclerosis (H[facility])    Chronic.  Noted on imaging 12/30/2020 -- continue statin therapy.      Relevant Medications   rosuvastatin (CRESTOR) 20 MG tablet   Chronic diastolic CHF (congestive heart failure) (HCC)    Noted on hospital notes.  Repeat echo ordered last visit.  Last echo July 2022 with EF 50-55%.  Recommend: - Reminded to call for an overnight weight gain of >2 pounds or a weekly weight gain of >5 pounds - not adding salt to food and read food labels. Reviewed the importance of keeping daily sodium intake to 2000mg  daily.  - Avoid Ibuprofen products      Relevant Medications   rosuvastatin (CRESTOR) 20 MG tablet     Respiratory   Asthmatic bronchitis , chronic    Chronic, ongoing.  She has improved symptoms with Markus Daft on board, will maintain this and work on cost help for it.  Sample provided in office and coupon card today.  Continue Albuterol PRN and nebs at home.  Would benefit return to pulmonary and ENT, but refuses at this time.        Relevant Medications   Budeson-Glycopyrrol-Formoterol (BREZTRI AEROSPHERE) 160-9-4.8 MCG/ACT AERO   albuterol (PROVENTIL) (2.5 MG/3ML) 0.083% nebulizer solution   albuterol (VENTOLIN HFA) 108 (90 Base) MCG/ACT inhaler   montelukast (SINGULAIR) 10 MG tablet     Other   Chronic back pain   Relevant Medications   buPROPion (WELLBUTRIN XL) 150 MG 24 hr tablet   FLUoxetine (PROZAC) 20 MG tablet   gabapentin (NEURONTIN) 300 MG capsule   traZODone (DESYREL) 50 MG tablet   Depression   Relevant Medications   buPROPion (WELLBUTRIN XL) 150 MG 24 hr tablet   FLUoxetine (PROZAC) 20 MG tablet   traZODone (DESYREL) 50 MG tablet   Mixed hyperlipidemia   Relevant Medications   rosuvastatin (CRESTOR) 20 MG tablet    Upper airway cough syndrome    Ongoing, will continue collaboration with ENT and pulmonary.  Appreciate their input, recent labs and notes reviewed.        Relevant Medications   cetirizine (ZYRTEC) 10 MG tablet   fluticasone (FLONASE) 50 MCG/ACT nasal spray   Other Visit Diagnoses     Flu vaccine need    -  Primary   Flu vaccine in office today.   Relevant Orders   Flu Vaccine QUAD 6+ mos PF IM (Fluarix Quad PF)        Follow up plan: Return in about 6 months (around 07/06/2023) for Annual physical.

## 2023-01-08 ENCOUNTER — Telehealth: Payer: Self-pay

## 2023-01-08 NOTE — Patient Instructions (Signed)
Ms. Nayak,  It was a pleasure to speak with you today, and I am so glad you were able to pick up your Breztri prescription at no charge to you.  Like I mentioned, if this copay were to increase throughout the course of the year, just give me a call.  I will be happy to investigate and assist with this matter.  Once again, you can reach me at [phone].  Thank you and have a great afternoon!  Paulina Fusi, PharmD, DPLA

## 2023-01-08 NOTE — Progress Notes (Signed)
   01/08/2023 Name: Audrey Richard MRN: [id number] DOB: [birth year]  Chief Complaint  Patient presents with   Medication Assistance    Breztri financial assistance    Audrey CAHOON is a 62 y.o. year old female who presented for a telephone visit.   They were referred to the pharmacist by their PCP for assistance in managing medication access.   Patient is participating in a Managed Medicaid Plan:  Yes (secondary to commercial insurance)  Subjective: Pharmacy referral for [place] cost.  Patient was given copay card and samples at recent visit with [facility].  She has Pharmacist, community and [employer].  Care Team: Primary Care Provider: Venita Lick, NP  Medication Access/Adherence  Current Pharmacy:  [facility] [address]), [place] [address] [place]) [place] [address] Phone: [phone] Fax: [phone]   Patient reports affordability concerns with their medications: No  Patient reports access/transportation concerns to their pharmacy: No  Patient reports adherence concerns with their medications:  No    Objective:  Lab Results  Component Value Date   HGBA1C 6.0 (H) 12/17/2022    Lab Results  Component Value Date   CREATININE 1.15 (H) 12/17/2022   BUN 12 12/17/2022   NA 139 12/17/2022   K 3.9 12/17/2022   CL 100 12/17/2022   CO2 23 12/17/2022    Lab Results  Component Value Date   CHOL 178 12/17/2022   HDL 65 12/17/2022   LDLCALC 92 12/17/2022   TRIG 118 12/17/2022   CHOLHDL 3.8 10/24/2020   Assessment/Plan:   -[place] to inquire about insurance coverage or denial and was informed patient had received Breztri at $0 copay on 01/06/23- verified via phone call with patient that she has the medication -Primary insurance covered at $40 copay, and coupon picked up the remaining balance -Unclear longevity of this benefit, but the patient has been counseled to  call me in the event copay is no longer $0; so I can assist  Follow Up Plan: Patient will reach out if copay increases from $0 throughout the year.  Thank you for involving me in the care of the patient; do not hesitate to reach out for any further assistance!

## 2023-03-09 ENCOUNTER — Other Ambulatory Visit: Payer: Self-pay | Admitting: Nurse Practitioner

## 2023-03-09 DIAGNOSIS — F324 Major depressive disorder, single episode, in partial remission: Secondary | ICD-10-CM

## 2023-03-09 NOTE — Telephone Encounter (Signed)
Medication Refill - Medication: montelukast (SINGULAIR) 10 MG tablet  traZODone (DESYREL) 50 MG tablet  famotidine (PEPCID) 20 MG tablet   Has the patient contacted their pharmacy? No.  Preferred Pharmacy (with phone number or street name):  [place] [place] - [address] Phone: [phone]  Fax: [phone]      Has the patient been seen for an appointment in the last year OR does the patient have an upcoming appointment? Yes.    Agent: Please be advised that RX refills may take up to 3 business days. We ask that you follow-up with your pharmacy.

## 2023-03-10 NOTE — Telephone Encounter (Signed)
Unable to refill per protocol, Rx requests were refilled 01/05/23 for 90 days and 4 refills. Too soon for refill.  Requested Prescriptions  Pending Prescriptions Disp Refills   montelukast (SINGULAIR) 10 MG tablet 90 tablet 4    Sig: Take 1 tablet (10 mg total) by mouth at bedtime.     Pulmonology:  Leukotriene Inhibitors Passed - 03/09/2023  4:03 PM      Passed - Valid encounter within last 12 months    Recent Outpatient Visits           2 months ago Flu vaccine need   [place], Henrine Screws T, NP   2 months ago COPD exacerbation ([place])   [place], Henrine Screws T, NP   11 months ago Asthmatic bronchitis , chronic ([place])   [place], Henrine Screws T, NP   1 year ago Asthmatic bronchitis , chronic ([place])   [place], Henrine Screws T, NP   1 year ago Asthmatic bronchitis , chronic ([place])   [place], Henrine Screws T, NP       Future Appointments             In 4 months Cannady, [place] T, NP [place], [facility]             traZODone (DESYREL) 50 MG tablet 30 tablet 5    Sig: Take 1 tablet (50 mg total) by mouth at bedtime as needed.     Psychiatry: Antidepressants - Serotonin Modulator Passed - 03/09/2023  4:03 PM      Passed - Completed PHQ-2 or PHQ-9 in the last 360 days      Passed - Valid encounter within last 6 months    Recent Outpatient Visits           2 months ago Flu vaccine need   [place], Henrine Screws T, NP   2 months ago COPD exacerbation ([place])   [place], Henrine Screws T, NP   11 months ago Asthmatic bronchitis , chronic ([place])   [place], Henrine Screws T, NP   1 year ago Asthmatic bronchitis , chronic ([place])   [place], Henrine Screws T, NP   1 year ago Asthmatic bronchitis ,  chronic ([place])   [place], Barbaraann Faster, NP       Future Appointments             In 4 months Cannady, [place] T, NP [place], [facility]             famotidine (PEPCID) 20 MG tablet 30 tablet 11    Sig: One after supper     Gastroenterology:  H2 Antagonists Passed - 03/09/2023  4:03 PM      Passed - Valid encounter within last 12 months    Recent Outpatient Visits           2 months ago Flu vaccine need   [place], Henrine Screws T, NP   2 months ago COPD exacerbation ([place])   [place], Henrine Screws T, NP   11 months ago Asthmatic bronchitis , chronic ([place])   [place], Henrine Screws T, NP   1 year ago Asthmatic bronchitis , chronic ([place])   [place]  Venita Lick, NP   1 year ago Asthmatic bronchitis , chronic ([place])   [place], Barbaraann Faster, NP       Future Appointments             In 4 months Cannady, Barbaraann Faster, NP [place], [facility]

## 2023-04-06 ENCOUNTER — Other Ambulatory Visit: Payer: Self-pay | Admitting: Nurse Practitioner

## 2023-04-06 DIAGNOSIS — G8929 Other chronic pain: Secondary | ICD-10-CM

## 2023-04-07 NOTE — Telephone Encounter (Signed)
Requested medication (s) are due for refill today: yes  Requested medication (s) are on the active medication list: yes    Last refill: 03/17/22  #90  3 refills  Future visit scheduled yes 07/13/23  Notes to clinic:Not delegated, please review. Thank you.  Requested Prescriptions  Pending Prescriptions Disp Refills   cyclobenzaprine (FLEXERIL) 10 MG tablet [Pharmacy Med Name: Cyclobenzaprine HCl 10 MG Oral Tablet] 90 tablet 0    Sig: TAKE 1 TABLET BY MOUTH AT BEDTIME     Not Delegated - Analgesics:  Muscle Relaxants Failed - 04/06/2023  1:35 PM      Failed - This refill cannot be delegated      Passed - Valid encounter within last 6 months    Recent Outpatient Visits           3 months ago Flu vaccine need   [facility] [place], Corrie Dandy T, NP   3 months ago COPD exacerbation (HCC)   [place] [facility] [place], Corrie Dandy T, NP   1 year ago Asthmatic bronchitis , chronic (HCC)   [place] [facility] [place], Corrie Dandy T, NP   1 year ago Asthmatic bronchitis , chronic (HCC)   [place] [facility] [place], Corrie Dandy T, NP   1 year ago Asthmatic bronchitis , chronic (HCC)   [place] [facility] [place], Dorie Rank, NP       Future Appointments             In 3 months Cannady, Dorie Rank, NP  [facility], [facility]

## 2023-05-11 ENCOUNTER — Other Ambulatory Visit: Payer: Self-pay | Admitting: Nurse Practitioner

## 2023-05-11 DIAGNOSIS — G8929 Other chronic pain: Secondary | ICD-10-CM

## 2023-05-12 NOTE — Telephone Encounter (Signed)
Requested medication (s) are due for refill today - no  Requested medication (s) are on the active medication list -yes  Future visit scheduled -yes  Last refill: 04/07/23 #90  Notes to clinic: non delegated Rx  Requested Prescriptions  Pending Prescriptions Disp Refills   cyclobenzaprine (FLEXERIL) 10 MG tablet [Pharmacy Med Name: Cyclobenzaprine HCl 10 MG Oral Tablet] 90 tablet 0    Sig: TAKE 1 TABLET BY MOUTH AT BEDTIME     Not Delegated - Analgesics:  Muscle Relaxants Failed - 05/11/2023  2:22 PM      Failed - This refill cannot be delegated      Passed - Valid encounter within last 6 months    Recent Outpatient Visits           4 months ago Flu vaccine need   [place] [facility] [place], Corrie Dandy T, NP   4 months ago COPD exacerbation (HCC)   [facility] [place], Corrie Dandy T, NP   1 year ago Asthmatic bronchitis , chronic (HCC)   [place] [facility] [place], Corrie Dandy T, NP   1 year ago Asthmatic bronchitis , chronic (HCC)   [place] [facility] [place], Corrie Dandy T, NP   1 year ago Asthmatic bronchitis , chronic (HCC)   [place] [facility] [place], Dorie Rank, NP       Future Appointments             In 2 months Cannady, Dorie Rank, NP [place] [facility], [facility]               Requested Prescriptions  Pending Prescriptions Disp Refills   cyclobenzaprine (FLEXERIL) 10 MG tablet [Pharmacy Med Name: Cyclobenzaprine HCl 10 MG Oral Tablet] 90 tablet 0    Sig: TAKE 1 TABLET BY MOUTH AT BEDTIME     Not Delegated - Analgesics:  Muscle Relaxants Failed - 05/11/2023  2:22 PM      Failed - This refill cannot be delegated      Passed - Valid encounter within last 6 months    Recent Outpatient Visits           4 months ago Flu vaccine need   [place] [facility] [place], Corrie Dandy T, NP   4 months ago COPD exacerbation (HCC)   [place] [facility] [place], Corrie Dandy T, NP   1 year ago Asthmatic bronchitis , chronic (HCC)   [place] [facility] [place], Corrie Dandy T, NP   1 year ago Asthmatic bronchitis , chronic (HCC)   [place] [facility] [place], Corrie Dandy T, NP   1 year ago Asthmatic bronchitis , chronic (HCC)   [place] [facility] [place], Dorie Rank, NP       Future Appointments             In 2 months Cannady, Dorie Rank, NP  [facility], [facility]

## 2023-05-21 ENCOUNTER — Telehealth: Payer: BC Managed Care – PPO | Admitting: Emergency Medicine

## 2023-05-21 DIAGNOSIS — J029 Acute pharyngitis, unspecified: Secondary | ICD-10-CM | POA: Diagnosis not present

## 2023-05-21 MED ORDER — AMOXICILLIN 500 MG PO CAPS: 500.0000 mg | ORAL_CAPSULE | Freq: Two times a day (BID) | ORAL | 0 refills | Status: AC

## 2023-05-21 NOTE — Patient Instructions (Signed)
Audrey Richard, thank you for joining Audrey Parsons, NP for today's virtual visit.  While this provider is not your primary care provider (PCP), if your PCP is located in our provider database this encounter information will be shared with them immediately following your visit.   [name] MyChart account gives you access to today's visit and all your visits, tests, and labs performed at [facility] " click here if you don't have a [facility] MyChart account or go to [email or web address]  Consent: (Patient) Audrey Richard provided verbal consent for this virtual visit at the beginning of the encounter.  Current Medications:  Current Outpatient Medications:    amoxicillin (AMOXIL) 500 MG capsule, Take 1 capsule (500 mg total) by mouth 2 (two) times daily for 10 days., Disp: 20 capsule, Rfl: 0   albuterol (PROVENTIL) (2.5 MG/3ML) 0.083% nebulizer solution, Inhale 3 mLs (2.5 mg total) into the lungs every 2 (two) hours as needed for shortness of breath or wheezing., Disp: 75 mL, Rfl: 5   albuterol (VENTOLIN HFA) 108 (90 Base) MCG/ACT inhaler, Inhale 2 puffs into the lungs every 6 (six) hours as needed for wheezing or shortness of breath., Disp: 18 g, Rfl: 4   Budeson-Glycopyrrol-Formoterol (BREZTRI AEROSPHERE) 160-9-4.8 MCG/ACT AERO, Inhale 2 puffs into the lungs 2 (two) times daily., Disp: 10.7 g, Rfl: 11   buPROPion (WELLBUTRIN XL) 150 MG 24 hr tablet, TAKE ONE TABLET BY MOUTH EVERY DAY, Disp: 90 tablet, Rfl: 4   cetirizine (ZYRTEC) 10 MG tablet, Take 1 tablet (10 mg total) by mouth daily., Disp: 90 tablet, Rfl: 4   Cholecalciferol (VITAMIN D3) 1.25 MG (50000 UT) CAPS, Take 1 capsule (1.25 mg total) by mouth once a week., Disp: 12 capsule, Rfl: 4   cyclobenzaprine (FLEXERIL) 10 MG tablet, TAKE 1 TABLET BY MOUTH AT BEDTIME, Disp: 90 tablet, Rfl: 0   famotidine (PEPCID) 20 MG tablet, One after supper, Disp: 30 tablet, Rfl: 11   FLUoxetine (PROZAC) 20 MG tablet, Take 1  tablet (20 mg total) by mouth daily., Disp: 90 tablet, Rfl: 4   fluticasone (FLONASE) 50 MCG/ACT nasal spray, Use 2 spray(s) in each nostril once daily, Disp: 16 g, Rfl: 4   gabapentin (NEURONTIN) 300 MG capsule, Take one capsule (300 MG) by mouth in the morning and at noon, then before bedtime take 2 capsules (600 MG) by mouth., Disp: 360 capsule, Rfl: 4   levothyroxine (SYNTHROID) 125 MCG tablet, Take 1 tablet (125 mcg total) by mouth daily., Disp: 90 tablet, Rfl: 4   montelukast (SINGULAIR) 10 MG tablet, Take 1 tablet (10 mg total) by mouth at bedtime., Disp: 90 tablet, Rfl: 4   olopatadine (PATADAY) 0.1 % ophthalmic solution, Place 1 drop into both eyes 2 (two) times daily., Disp: 5 mL, Rfl: 12   omeprazole (PRILOSEC) 40 MG capsule, TAKE ONE CAPSULE BY MOUTH EVERY EVENING, Disp: , Rfl:    rosuvastatin (CRESTOR) 20 MG tablet, TAKE ONE TABLET BY MOUTH EVERY DAY, Disp: 90 tablet, Rfl: 4   traZODone (DESYREL) 50 MG tablet, Take 1 tablet (50 mg total) by mouth at bedtime as needed., Disp: 30 tablet, Rfl: 5   triamcinolone ointment (KENALOG) 0.5 %, Apply 1 application. topically 2 (two) times daily., Disp: 30 g, Rfl: 0   vitamin B-12 (CYANOCOBALAMIN) 500 MCG tablet, Take 1 tablet (500 mcg total) by mouth daily., Disp: 90 tablet, Rfl: 4   Medications ordered in this encounter:  Meds ordered this encounter  Medications   amoxicillin (AMOXIL)  500 MG capsule    Sig: Take 1 capsule (500 mg total) by mouth 2 (two) times daily for 10 days.    Dispense:  20 capsule    Refill:  0     *If you need refills on other medications prior to your next appointment, please contact your pharmacy*  Follow-Up: Call back or seek an in-person evaluation if the symptoms worsen or if the condition fails to improve as anticipated.  [facility] [phone]  Other Instructions I recommend using saline nasal spray several times a day (at different times than prescription nose sprays so the medicine  doesn't get washed out of your nose) to help relieve the ear pressure and help with post-nasal drainage.   Finish all of the antibiotics, even if you are feeling better.   Using throat lozenges and salt water gargles can help relieve sore throat.    If you have been instructed to have an in-person evaluation today at a local Urgent Care facility, please use the link below. It will take you to a list of all of our available [facility], including address, phone number and hours of operation. Please do not delay care.  [facility]  If you or a family member do not have a primary care provider, use the link below to schedule a visit and establish care. When you choose a [facility] primary care physician or advanced practice provider, you gain a long-term partner in health. Find a Primary Care Provider  Learn more about [facility] in-office and virtual care options: [facility] - Get Care Now

## 2023-05-21 NOTE — Progress Notes (Signed)
Virtual Visit Consent   Audrey Richard, you are scheduled for a virtual visit with a [facility] provider today. Just as with appointments in the office, your consent must be obtained to participate. Your consent will be active for this visit and any virtual visit you may have with one of our providers in the next 365 days. If you have a MyChart account, a copy of this consent can be sent to you electronically.  As this is a virtual visit, video technology does not allow for your provider to perform a traditional examination. This may limit your provider's ability to fully assess your condition. If your provider identifies any concerns that need to be evaluated in person or the need to arrange testing (such as labs, EKG, etc.), we will make arrangements to do so. Although advances in technology are sophisticated, we cannot ensure that it will always work on either your end or our end. If the connection with a video visit is poor, the visit may have to be switched to a telephone visit. With either a video or telephone visit, we are not always able to ensure that we have a secure connection.  By engaging in this virtual visit, you consent to the provision of healthcare and authorize for your insurance to be billed (if applicable) for the services provided during this visit. Depending on your insurance coverage, you may receive a charge related to this service.  I need to obtain your verbal consent now. Are you willing to proceed with your visit today? Audrey Richard has provided verbal consent on 05/21/2023 for a virtual visit (video or telephone). [name], NP  Date: 05/21/2023 6:02 PM  Virtual Visit via Video Note   I, [name], connected with  Audrey Richard  ([id number], August 25, 1961) on 05/21/23 at  6:00 PM EDT by a video-enabled telemedicine application and verified that I am speaking with the correct person using two identifiers.  Location: Patient: Virtual Visit Location Patient:  Home Provider: Virtual Visit Location Provider: Home Office   I discussed the limitations of evaluation and management by telemedicine and the availability of in person appointments. The patient expressed understanding and agreed to proceed.    History of Present Illness: Audrey Richard is a 62 y.o. who identifies as a female who was assigned female at birth, and is being seen today for sore throat and L ear pain. Throat pain is mostly on L side, too. Does have some post nasal drainage, denies stuffy nose. Denies fever. States daughter looked in her throat and it is red with white patches on it. Pt palpated her own submandibular lymph node area and it is tender, especially on L side. Ear pain and throat pain are intermittent, worst at night and in the morning. Has had sx for several days and feels it is worsening. Could have been exposed to many illnesses - works as a Holiday representative at [employer]  HPI: HPI  Problems:  Patient Active Problem List   Diagnosis Date Noted   Chronic kidney disease, stage 3a (HCC) 12/04/2022   Chronic diastolic CHF (congestive heart failure) (HCC) 12/04/2022   Sepsis (HCC) 12/04/2022   Obesity 12/11/2021   Upper airway cough syndrome 08/06/2021   Aortic atherosclerosis (HCC) 07/13/2021   B12 deficiency 06/18/2021   Vitamin D deficiency 06/15/2021   Elevated hemoglobin A1c 05/14/2021   Chronic back pain 01/10/2020   Acid reflux 09/29/2019   Primary generalized (osteo)arthritis 09/29/2019   Health care maintenance 09/29/2019  Asthmatic bronchitis , chronic 11/15/2018   Allergic rhinitis 12/08/2017   Osteopenia 06/11/2015   Depression 06/11/2015   Insomnia 06/11/2015   Hashimoto's thyroiditis 06/11/2015   Mixed hyperlipidemia 06/11/2015    Allergies:  Allergies  Allergen Reactions   Biaxin [Clarithromycin]     Patient reports itching, nausea   Iodine     Iodine blisters skin   Medications:  Current Outpatient Medications:    amoxicillin (AMOXIL) 500 MG  capsule, Take 1 capsule (500 mg total) by mouth 2 (two) times daily for 10 days., Disp: 20 capsule, Rfl: 0   albuterol (PROVENTIL) (2.5 MG/3ML) 0.083% nebulizer solution, Inhale 3 mLs (2.5 mg total) into the lungs every 2 (two) hours as needed for shortness of breath or wheezing., Disp: 75 mL, Rfl: 5   albuterol (VENTOLIN HFA) 108 (90 Base) MCG/ACT inhaler, Inhale 2 puffs into the lungs every 6 (six) hours as needed for wheezing or shortness of breath., Disp: 18 g, Rfl: 4   Budeson-Glycopyrrol-Formoterol (BREZTRI AEROSPHERE) 160-9-4.8 MCG/ACT AERO, Inhale 2 puffs into the lungs 2 (two) times daily., Disp: 10.7 g, Rfl: 11   buPROPion (WELLBUTRIN XL) 150 MG 24 hr tablet, TAKE ONE TABLET BY MOUTH EVERY DAY, Disp: 90 tablet, Rfl: 4   cetirizine (ZYRTEC) 10 MG tablet, Take 1 tablet (10 mg total) by mouth daily., Disp: 90 tablet, Rfl: 4   Cholecalciferol (VITAMIN D3) 1.25 MG (50000 UT) CAPS, Take 1 capsule (1.25 mg total) by mouth once a week., Disp: 12 capsule, Rfl: 4   cyclobenzaprine (FLEXERIL) 10 MG tablet, TAKE 1 TABLET BY MOUTH AT BEDTIME, Disp: 90 tablet, Rfl: 0   famotidine (PEPCID) 20 MG tablet, One after supper, Disp: 30 tablet, Rfl: 11   FLUoxetine (PROZAC) 20 MG tablet, Take 1 tablet (20 mg total) by mouth daily., Disp: 90 tablet, Rfl: 4   fluticasone (FLONASE) 50 MCG/ACT nasal spray, Use 2 spray(s) in each nostril once daily, Disp: 16 g, Rfl: 4   gabapentin (NEURONTIN) 300 MG capsule, Take one capsule (300 MG) by mouth in the morning and at noon, then before bedtime take 2 capsules (600 MG) by mouth., Disp: 360 capsule, Rfl: 4   levothyroxine (SYNTHROID) 125 MCG tablet, Take 1 tablet (125 mcg total) by mouth daily., Disp: 90 tablet, Rfl: 4   montelukast (SINGULAIR) 10 MG tablet, Take 1 tablet (10 mg total) by mouth at bedtime., Disp: 90 tablet, Rfl: 4   olopatadine (PATADAY) 0.1 % ophthalmic solution, Place 1 drop into both eyes 2 (two) times daily., Disp: 5 mL, Rfl: 12   omeprazole (PRILOSEC)  40 MG capsule, TAKE ONE CAPSULE BY MOUTH EVERY EVENING, Disp: , Rfl:    rosuvastatin (CRESTOR) 20 MG tablet, TAKE ONE TABLET BY MOUTH EVERY DAY, Disp: 90 tablet, Rfl: 4   traZODone (DESYREL) 50 MG tablet, Take 1 tablet (50 mg total) by mouth at bedtime as needed., Disp: 30 tablet, Rfl: 5   triamcinolone ointment (KENALOG) 0.5 %, Apply 1 application. topically 2 (two) times daily., Disp: 30 g, Rfl: 0   vitamin B-12 (CYANOCOBALAMIN) 500 MCG tablet, Take 1 tablet (500 mcg total) by mouth daily., Disp: 90 tablet, Rfl: 4  Observations/Objective: Patient is well-developed, well-nourished in no acute distress.  Resting comfortably  at home.  Head is normocephalic, atraumatic.  No labored breathing.  Speech is clear and coherent with logical content.  Patient is alert and oriented at baseline.    Assessment and Plan: 1. Pharyngitis, unspecified etiology  I have seen positive strep tests in  people wihtout fever and with uri symptoms lately in person. Given duaghter's description of appearance of pharynx, will treat for strep.   Follow Up Instructions: I discussed the assessment and treatment plan with the patient. The patient was provided an opportunity to ask questions and all were answered. The patient agreed with the plan and demonstrated an understanding of the instructions.  A copy of instructions were sent to the patient via MyChart unless otherwise noted below.    The patient was advised to call back or seek an in-person evaluation if the symptoms worsen or if the condition fails to improve as anticipated.  Time:  I spent 12 minutes with the patient via telehealth technology discussing the above problems/concerns.    [name], NP

## 2023-07-13 ENCOUNTER — Encounter: Payer: BC Managed Care – PPO | Admitting: Nurse Practitioner

## 2023-07-13 DIAGNOSIS — J4489 Other specified chronic obstructive pulmonary disease: Secondary | ICD-10-CM

## 2023-07-13 DIAGNOSIS — I5032 Chronic diastolic (congestive) heart failure: Secondary | ICD-10-CM

## 2023-07-13 DIAGNOSIS — R7309 Other abnormal glucose: Secondary | ICD-10-CM

## 2023-07-13 DIAGNOSIS — E063 Autoimmune thyroiditis: Secondary | ICD-10-CM

## 2023-07-13 DIAGNOSIS — K219 Gastro-esophageal reflux disease without esophagitis: Secondary | ICD-10-CM

## 2023-07-13 DIAGNOSIS — Z0000 Encounter for general adult medical examination without abnormal findings: Secondary | ICD-10-CM

## 2023-07-13 DIAGNOSIS — E538 Deficiency of other specified B group vitamins: Secondary | ICD-10-CM

## 2023-07-13 DIAGNOSIS — E559 Vitamin D deficiency, unspecified: Secondary | ICD-10-CM

## 2023-07-13 DIAGNOSIS — Z1231 Encounter for screening mammogram for malignant neoplasm of breast: Secondary | ICD-10-CM

## 2023-07-13 DIAGNOSIS — N1831 Chronic kidney disease, stage 3a: Secondary | ICD-10-CM

## 2023-07-13 DIAGNOSIS — I700 Atherosclerosis of aorta: Secondary | ICD-10-CM

## 2023-07-13 DIAGNOSIS — E782 Mixed hyperlipidemia: Secondary | ICD-10-CM

## 2023-07-13 DIAGNOSIS — F324 Major depressive disorder, single episode, in partial remission: Secondary | ICD-10-CM

## 2023-07-13 DIAGNOSIS — E6609 Other obesity due to excess calories: Secondary | ICD-10-CM

## 2023-08-11 ENCOUNTER — Other Ambulatory Visit: Payer: Self-pay | Admitting: Nurse Practitioner

## 2023-08-11 DIAGNOSIS — G8929 Other chronic pain: Secondary | ICD-10-CM

## 2023-08-12 NOTE — Telephone Encounter (Signed)
Requested medication (s) are due for refill today: yes  Requested medication (s) are on the active medication list: yes  Last refill:  04/07/23  Future visit scheduled: yes  Notes to clinic:  Unable to refill per protocol, cannot delegate.      Requested Prescriptions  Pending Prescriptions Disp Refills   cyclobenzaprine (FLEXERIL) 10 MG tablet [Pharmacy Med Name: Cyclobenzaprine HCl 10 MG Oral Tablet] 90 tablet 0    Sig: TAKE 1 TABLET BY MOUTH AT BEDTIME     Not Delegated - Analgesics:  Muscle Relaxants Failed - 08/11/2023 12:19 PM      Failed - This refill cannot be delegated      Failed - Valid encounter within last 6 months    Recent Outpatient Visits           7 months ago Flu vaccine need   [place] [facility] [place] [place], Corrie Dandy T, NP   7 months ago COPD exacerbation (HCC)   [place] [facility] [place], Corrie Dandy T, NP   1 year ago Asthmatic bronchitis , chronic (HCC)   [place] [facility] [place], Corrie Dandy T, NP   1 year ago Asthmatic bronchitis , chronic (HCC)   [place] [facility] [place], Corrie Dandy T, NP   1 year ago Asthmatic bronchitis , chronic (HCC)   [place] [facility] [place], Dorie Rank, NP       Future Appointments             In 1 month Cannady, Dorie Rank, NP  [facility], [facility]

## 2023-09-21 ENCOUNTER — Encounter: Payer: BC Managed Care – PPO | Admitting: Nurse Practitioner

## 2023-09-21 DIAGNOSIS — N1831 Chronic kidney disease, stage 3a: Secondary | ICD-10-CM

## 2023-09-21 DIAGNOSIS — J4489 Other specified chronic obstructive pulmonary disease: Secondary | ICD-10-CM

## 2023-09-21 DIAGNOSIS — E6609 Other obesity due to excess calories: Secondary | ICD-10-CM

## 2023-09-21 DIAGNOSIS — R7309 Other abnormal glucose: Secondary | ICD-10-CM

## 2023-09-21 DIAGNOSIS — I700 Atherosclerosis of aorta: Secondary | ICD-10-CM

## 2023-09-21 DIAGNOSIS — E063 Autoimmune thyroiditis: Secondary | ICD-10-CM

## 2023-09-21 DIAGNOSIS — M8589 Other specified disorders of bone density and structure, multiple sites: Secondary | ICD-10-CM

## 2023-09-21 DIAGNOSIS — E782 Mixed hyperlipidemia: Secondary | ICD-10-CM

## 2023-09-21 DIAGNOSIS — E559 Vitamin D deficiency, unspecified: Secondary | ICD-10-CM

## 2023-09-21 DIAGNOSIS — E538 Deficiency of other specified B group vitamins: Secondary | ICD-10-CM

## 2023-09-21 DIAGNOSIS — K219 Gastro-esophageal reflux disease without esophagitis: Secondary | ICD-10-CM

## 2023-09-21 DIAGNOSIS — Z0000 Encounter for general adult medical examination without abnormal findings: Secondary | ICD-10-CM

## 2023-09-21 DIAGNOSIS — Z23 Encounter for immunization: Secondary | ICD-10-CM

## 2023-09-21 DIAGNOSIS — I5032 Chronic diastolic (congestive) heart failure: Secondary | ICD-10-CM

## 2023-09-21 DIAGNOSIS — F324 Major depressive disorder, single episode, in partial remission: Secondary | ICD-10-CM

## 2023-09-29 ENCOUNTER — Emergency Department
Admission: EM | Admit: 2023-09-29 | Discharge: 2023-09-29 | Disposition: A | Discharge status: Home / Self Care | Payer: BC Managed Care – PPO | Attending: Emergency Medicine | Admitting: Emergency Medicine

## 2023-09-29 ENCOUNTER — Other Ambulatory Visit: Payer: Self-pay

## 2023-09-29 ENCOUNTER — Emergency Department: Payer: BC Managed Care – PPO

## 2023-09-29 DIAGNOSIS — X509XXA Other and unspecified overexertion or strenuous movements or postures, initial encounter: Secondary | ICD-10-CM | POA: Diagnosis not present

## 2023-09-29 DIAGNOSIS — M25571 Pain in right ankle and joints of right foot: Secondary | ICD-10-CM | POA: Insufficient documentation

## 2023-09-29 DIAGNOSIS — S99811A Other specified injuries of right ankle, initial encounter: Secondary | ICD-10-CM | POA: Diagnosis not present

## 2023-09-29 DIAGNOSIS — Y9301 Activity, walking, marching and hiking: Secondary | ICD-10-CM | POA: Insufficient documentation

## 2023-09-29 DIAGNOSIS — M7731 Calcaneal spur, right foot: Secondary | ICD-10-CM | POA: Diagnosis not present

## 2023-09-29 NOTE — Discharge Instructions (Addendum)
Your evaluated in the ED for ankle pain following an injury.  Your x-ray is normal and shows no broken bone.  You will be placed in a ankle brace and provided crutches to bear weight as tolerated.  Take ibuprofen for pain as needed.  Rest, ice the affected area and use Ace bandage for compression.  At night keep ankle elevated to help with swelling.  If symptoms do not improve, please follow-up with orthopedics.

## 2023-09-29 NOTE — ED Triage Notes (Signed)
Pt sts that she has been having right ankle pain for the last four days. Pt has not deformity, swelling or bruising to the right ankle.

## 2023-09-29 NOTE — ED Notes (Signed)
Pt verbalizes understanding of discharge instructions. Opportunity for questioning and answers were provided. Pt discharged from ED to home with daughter.    

## 2023-09-29 NOTE — ED Provider Notes (Signed)
[facility] Emergency Department Provider Note     Event Date/Time   First MD Initiated Contact with Patient 09/29/23 1627     (approximate)   History   Ankle Pain   HPI  ELITA DAME is a 62 y.o. female with a history of osteoporosis presents to the ED with complaint of right ankle pain x 4 days.  Patient reports she was walking down her steps when she stepped the wrong way and felt a pop.  No immediate pain.  Patient reports swelling and pain has increased over the past 4 days.  She has tried Tylenol with minimal relief.  No other complaints at this time.     Physical Exam   Triage Vital Signs: ED Triage Vitals  Encounter Vitals Group     BP 09/29/23 1550 122/74     Systolic BP Percentile --      Diastolic BP Percentile --      Pulse Rate 09/29/23 1548 91     Resp 09/29/23 1548 18     Temp 09/29/23 1548 98.6 F (37 C)     Temp Source 09/29/23 1548 Oral     SpO2 09/29/23 1548 91 %     Weight 09/29/23 1549 210 lb (95.3 kg)     Height 09/29/23 1549 5\' 7"  (1.702 m)     Head Circumference --      Peak Flow --      Pain Score 09/29/23 1548 9     Pain Loc --      Pain Education --      Exclude from Growth Chart --     Most recent vital signs: Vitals:   09/29/23 1548 09/29/23 1550  BP:  122/74  Pulse: 91   Resp: 18   Temp: 98.6 F (37 C)   SpO2: 91%     General Awake, no distress.  HEENT NCAT. PERRL. EOMI. No rhinorrhea. Mucous membranes are moist.  CV:  Good peripheral perfusion.  RESP:  Normal effort.  ABD:  No distention.  Other:  Right ankle reveals edema over lateral malleolus.  No skin discoloration.  Neurovascular status intact all throughout.   ED Results / Procedures / Treatments   Labs (all labs ordered are listed, but only abnormal results are displayed) Labs Reviewed - No data to display  RADIOLOGY  I personally viewed and evaluated these images as part of my medical decision making, as well as reviewing the  written report by the radiologist.  ED Provider Interpretation: Right ankle x-ray is unremarkable.  DG Ankle Complete Right  Result Date: 09/29/2023 CLINICAL DATA:  Fall 4 days ago with persistent right ankle pain. EXAM: RIGHT ANKLE - COMPLETE 3+ VIEW COMPARISON:  None Available. FINDINGS: There is no evidence of acute fracture, dislocation, or joint effusion. No arthropathy or bony lesion. Small plantar calcaneal spur. Soft tissues are unremarkable. IMPRESSION: No acute findings. Small plantar calcaneal spur. Electronically Signed   By: Irish Lack M.D.   On: 09/29/2023 17:01    PROCEDURES:  Critical Care performed: No  Procedures   MEDICATIONS ORDERED IN ED: Medications - No data to display   IMPRESSION / MDM / ASSESSMENT AND PLAN / ED COURSE  I reviewed the triage vital signs and the nursing notes.                               62 y.o. female presents to the  emergency department for evaluation and treatment of acute right ankle pain. See HPI for further details.   Differential diagnosis includes, but is not limited to fracture, dislocation, sprain  Patient's presentation is most consistent with acute complicated illness / injury requiring diagnostic workup.  Patient's presentation is clinically consistent with a sprain.  Given history and physical exam findings as stated above x-ray indicated.  Right ankle x-ray reveals no acute bony abnormality.  Patient will be placed in a lace up ankle brace and provided with crutches.  RICE therapy education provided. patient is to follow-up with orthopedic if symptoms do not improve.  ED precautions discussed.  All questions and concerns were addressed during ED visit.     FINAL CLINICAL IMPRESSION(S) / ED DIAGNOSES   Final diagnoses:  Acute right ankle pain    Rx / DC Orders   ED Discharge Orders     None       Note:  This document was prepared using Dragon voice recognition software and may include unintentional dictation  errors.    Romeo Apple, Alanda Colton A, PA-C 09/29/23 1826    Merwyn Katos, MD 09/29/23 2237

## 2023-10-07 DIAGNOSIS — M7661 Achilles tendinitis, right leg: Secondary | ICD-10-CM | POA: Diagnosis not present

## 2023-10-07 DIAGNOSIS — M7751 Other enthesopathy of right foot: Secondary | ICD-10-CM | POA: Diagnosis not present

## 2023-10-07 DIAGNOSIS — E079 Disorder of thyroid, unspecified: Secondary | ICD-10-CM | POA: Diagnosis not present

## 2023-10-07 DIAGNOSIS — M7731 Calcaneal spur, right foot: Secondary | ICD-10-CM | POA: Diagnosis not present

## 2023-10-07 DIAGNOSIS — E785 Hyperlipidemia, unspecified: Secondary | ICD-10-CM | POA: Diagnosis not present

## 2023-10-07 DIAGNOSIS — Z79890 Hormone replacement therapy (postmenopausal): Secondary | ICD-10-CM | POA: Diagnosis not present

## 2023-10-07 DIAGNOSIS — M79671 Pain in right foot: Secondary | ICD-10-CM | POA: Diagnosis not present

## 2023-10-07 DIAGNOSIS — M67971 Unspecified disorder of synovium and tendon, right ankle and foot: Secondary | ICD-10-CM | POA: Diagnosis not present

## 2023-10-19 ENCOUNTER — Other Ambulatory Visit: Payer: Self-pay | Admitting: Nurse Practitioner

## 2023-10-20 NOTE — Telephone Encounter (Signed)
Requested medications are due for refill today.  unsure  Requested medications are on the active medications list.  no  Last refill. 08/08/2019  Future visit scheduled.   yes  Notes to clinic.  Medication not on med list. Medication was d/c'd 08/30/2019.    Requested Prescriptions  Pending Prescriptions Disp Refills   meloxicam (MOBIC) 15 MG tablet [Pharmacy Med Name: Meloxicam 15 MG Oral Tablet] 30 tablet 0    Sig: TAKE 1 TABLET BY MOUTH ONCE DAILY AS NEEDED FOR PAIN     Analgesics:  COX2 Inhibitors Failed - 10/19/2023  6:13 PM      Failed - Manual Review: Labs are only required if the patient has taken medication for more than 8 weeks.      Failed - Cr in normal range and within 360 days    Creatinine  Date Value Ref Range Status  02/14/2014 1.15 0.60 - 1.30 mg/dL Final   Creatinine, Ser  Date Value Ref Range Status  12/17/2022 1.15 (H) 0.57 - 1.00 mg/dL Final         Passed - HGB in normal range and within 360 days    Hemoglobin  Date Value Ref Range Status  12/17/2022 13.9 11.1 - 15.9 g/dL Final         Passed - HCT in normal range and within 360 days    Hematocrit  Date Value Ref Range Status  12/17/2022 41.3 34.0 - 46.6 % Final         Passed - AST in normal range and within 360 days    AST  Date Value Ref Range Status  12/17/2022 16 0 - 40 IU/L Final   SGOT(AST)  Date Value Ref Range Status  02/14/2014 19 15 - 37 Unit/L Final         Passed - ALT in normal range and within 360 days    ALT  Date Value Ref Range Status  12/17/2022 21 0 - 32 IU/L Final   SGPT (ALT)  Date Value Ref Range Status  02/14/2014 29 12 - 78 U/L Final         Passed - eGFR is 30 or above and within 360 days    EGFR (African American)  Date Value Ref Range Status  02/14/2014 >60  Final   GFR calc Af Amer  Date Value Ref Range Status  10/24/2020 61 >59 mL/min/1.73 Final    Comment:    **In accordance with recommendations from the NKF-ASN Task force,**   [facility] is in the  process of updating its eGFR calculation to the   2021 CKD-EPI creatinine equation that estimates kidney function   without a race variable.    EGFR (Non-African Amer.)  Date Value Ref Range Status  02/14/2014 55 (L)  Final    Comment:    eGFR values <20mL/min/1.73 m2 may be an indication of chronic kidney disease (CKD). Calculated eGFR is useful in patients with stable renal function. The eGFR calculation will not be reliable in acutely ill patients when serum creatinine is changing rapidly. It is not useful in  patients on dialysis. The eGFR calculation may not be applicable to patients at the low and high extremes of body sizes, pregnant women, and vegetarians.    GFR, Estimated  Date Value Ref Range Status  12/05/2022 >60 >60 mL/min Final    Comment:    (NOTE) Calculated using the CKD-EPI Creatinine Equation (2021)    eGFR  Date Value Ref Range Status  12/17/2022 54 (L) >  59 mL/min/1.73 Final         Passed - Patient is not pregnant      Passed - Valid encounter within last 12 months    Recent Outpatient Visits           9 months ago Flu vaccine need   [place] [facility] [place], Corrie Dandy T, NP   10 months ago COPD exacerbation (HCC)   [place] [facility] [place], Corrie Dandy T, NP   1 year ago Asthmatic bronchitis , chronic (HCC)   [place] [facility] [place], Corrie Dandy T, NP   1 year ago Asthmatic bronchitis , chronic (HCC)   [place] [facility] [place], Corrie Dandy T, NP   1 year ago Asthmatic bronchitis , chronic (HCC)   [place] [facility] [place], Dorie Rank, NP       Future Appointments             In 2 weeks Cann[place], Dorie Rank, NP [place] [facility]

## 2023-10-22 ENCOUNTER — Ambulatory Visit: Payer: BC Managed Care – PPO | Admitting: Podiatry

## 2023-10-22 ENCOUNTER — Encounter: Payer: Self-pay | Admitting: Podiatry

## 2023-10-22 VITALS — Ht 67.0 in | Wt 210.0 lb

## 2023-10-22 DIAGNOSIS — M7671 Peroneal tendinitis, right leg: Secondary | ICD-10-CM | POA: Diagnosis not present

## 2023-10-22 MED ORDER — METHYLPREDNISOLONE 4 MG PO TBPK: ORAL_TABLET | ORAL | 0 refills | Status: DC

## 2023-10-22 MED ORDER — MELOXICAM 15 MG PO TABS: 15.0000 mg | ORAL_TABLET | Freq: Every day | ORAL | 0 refills | Status: DC

## 2023-10-22 NOTE — Progress Notes (Signed)
Subjective:  Patient ID: Audrey Richard, female    DOB: 04/16/1961,  MRN: [id number]  Chief Complaint  Patient presents with   Foot Pain    RM7: np spur on heel of right foot    62 y.o. female presents with the above complaint.  Patient presents with complaint of right peroneal tendon pain.  Patient states it is painful.  Hurts with ambulation worse with pressure patient would like to discuss treatment options for this.  Pain scale 7 out of 10 dull achy in nature.  Pain scale 7 out of 10 dull achy she would like to discuss treatment options for this.  Hurts with ambulation   Review of Systems: Negative except as noted in the HPI. Denies N/V/F/Ch.  Past Medical History:  Diagnosis Date   Asthma    COPD (chronic obstructive pulmonary disease) (HCC)    Depression    High cholesterol    Osteoporosis    Throat ulcer    Thyroid disease     Current Outpatient Medications:    albuterol (PROVENTIL) (2.5 MG/3ML) 0.083% nebulizer solution, Inhale 3 mLs (2.5 mg total) into the lungs every 2 (two) hours as needed for shortness of breath or wheezing., Disp: 75 mL, Rfl: 5   albuterol (VENTOLIN HFA) 108 (90 Base) MCG/ACT inhaler, Inhale 2 puffs into the lungs every 6 (six) hours as needed for wheezing or shortness of breath., Disp: 18 g, Rfl: 4   Budeson-Glycopyrrol-Formoterol (BREZTRI AEROSPHERE) 160-9-4.8 MCG/ACT AERO, Inhale 2 puffs into the lungs 2 (two) times daily., Disp: 10.7 g, Rfl: 11   buPROPion (WELLBUTRIN XL) 150 MG 24 hr tablet, TAKE ONE TABLET BY MOUTH EVERY DAY, Disp: 90 tablet, Rfl: 4   cetirizine (ZYRTEC) 10 MG tablet, Take 1 tablet (10 mg total) by mouth daily., Disp: 90 tablet, Rfl: 4   Cholecalciferol (VITAMIN D3) 1.25 MG (50000 UT) CAPS, Take 1 capsule (1.25 mg total) by mouth once a week., Disp: 12 capsule, Rfl: 4   cyclobenzaprine (FLEXERIL) 10 MG tablet, TAKE 1 TABLET BY MOUTH AT BEDTIME, Disp: 90 tablet, Rfl: 4   famotidine (PEPCID) 20 MG tablet, One after supper, Disp:  30 tablet, Rfl: 11   FLUoxetine (PROZAC) 20 MG tablet, Take 1 tablet (20 mg total) by mouth daily., Disp: 90 tablet, Rfl: 4   fluticasone (FLONASE) 50 MCG/ACT nasal spray, Use 2 spray(s) in each nostril once daily, Disp: 16 g, Rfl: 4   gabapentin (NEURONTIN) 300 MG capsule, Take one capsule (300 MG) by mouth in the morning and at noon, then before bedtime take 2 capsules (600 MG) by mouth., Disp: 360 capsule, Rfl: 4   levothyroxine (SYNTHROID) 125 MCG tablet, Take 1 tablet (125 mcg total) by mouth daily., Disp: 90 tablet, Rfl: 4   meloxicam (MOBIC) 15 MG tablet, Take 1 tablet (15 mg total) by mouth daily., Disp: 30 tablet, Rfl: 0   methylPREDNISolone (MEDROL DOSEPAK) 4 MG TBPK tablet, Take as directed, Disp: 21 each, Rfl: 0   montelukast (SINGULAIR) 10 MG tablet, Take 1 tablet (10 mg total) by mouth at bedtime., Disp: 90 tablet, Rfl: 4   olopatadine (PATADAY) 0.1 % ophthalmic solution, Place 1 drop into both eyes 2 (two) times daily., Disp: 5 mL, Rfl: 12   omeprazole (PRILOSEC) 40 MG capsule, TAKE ONE CAPSULE BY MOUTH EVERY EVENING, Disp: , Rfl:    rosuvastatin (CRESTOR) 20 MG tablet, TAKE ONE TABLET BY MOUTH EVERY DAY, Disp: 90 tablet, Rfl: 4   traZODone (DESYREL) 50 MG tablet, Take 1  tablet (50 mg total) by mouth at bedtime as needed., Disp: 30 tablet, Rfl: 5   triamcinolone ointment (KENALOG) 0.5 %, Apply 1 application. topically 2 (two) times daily., Disp: 30 g, Rfl: 0   vitamin B-12 (CYANOCOBALAMIN) 500 MCG tablet, Take 1 tablet (500 mcg total) by mouth daily., Disp: 90 tablet, Rfl: 4  Social History   Tobacco Use  Smoking Status Former   Current packs/day: 0.00   Types: Cigarettes   Quit date: 11/15/2005   Years since quitting: 17.9  Smokeless Tobacco Never    Allergies  Allergen Reactions   Biaxin [Clarithromycin]     Patient reports itching, nausea   Iodine     Iodine blisters skin   Objective:  There were no vitals filed for this visit. Body mass index is 32.89  kg/m. Constitutional Well developed. Well nourished.  Vascular Dorsalis pedis pulses palpable bilaterally. Posterior tibial pulses palpable bilaterally. Capillary refill normal to all digits.  No cyanosis or clubbing noted. Pedal hair growth normal.  Neurologic Normal speech. Oriented to person, place, and time. Epicritic sensation to light touch grossly present bilaterally.  Dermatologic Nails well groomed and normal in appearance. No open wounds. No skin lesions.  Orthopedic: Pain on palpation along the course of the peroneal tendon no pain at the insertion mild pain with dorsiflexion eversion of the foot resisted no pain with plantarflexion inversion of the foot.  No pain at the ATFL ligament Achilles tendon posterior tibial tendon   Radiographs: None Assessment:   1. Peroneal tendinitis, right    Plan:  Patient was evaluated and treated and all questions answered.  Right peroneal tendinitis -All questions and concerns were discussed with the patient in extensive detail given the amount of pain that she is having she will benefit from cam boot immobilization I discussed with the patient extensive detail she states understanding cam boot was dispensed.  If there is no improvement we will discuss steroid injection.  No follow-ups on file.

## 2023-11-09 ENCOUNTER — Encounter: Payer: Self-pay | Admitting: Nurse Practitioner

## 2023-11-09 ENCOUNTER — Ambulatory Visit: Payer: BC Managed Care – PPO | Admitting: Nurse Practitioner

## 2023-11-09 VITALS — BP 126/82 | HR 76 | Temp 97.9°F | Ht 67.0 in | Wt 215.6 lb

## 2023-11-09 DIAGNOSIS — F324 Major depressive disorder, single episode, in partial remission: Secondary | ICD-10-CM

## 2023-11-09 DIAGNOSIS — I700 Atherosclerosis of aorta: Secondary | ICD-10-CM

## 2023-11-09 DIAGNOSIS — J4489 Other specified chronic obstructive pulmonary disease: Secondary | ICD-10-CM

## 2023-11-09 DIAGNOSIS — Z1231 Encounter for screening mammogram for malignant neoplasm of breast: Secondary | ICD-10-CM | POA: Diagnosis not present

## 2023-11-09 DIAGNOSIS — Z0000 Encounter for general adult medical examination without abnormal findings: Secondary | ICD-10-CM

## 2023-11-09 DIAGNOSIS — E063 Autoimmune thyroiditis: Secondary | ICD-10-CM

## 2023-11-09 DIAGNOSIS — E6609 Other obesity due to excess calories: Secondary | ICD-10-CM

## 2023-11-09 DIAGNOSIS — F5104 Psychophysiologic insomnia: Secondary | ICD-10-CM

## 2023-11-09 DIAGNOSIS — K219 Gastro-esophageal reflux disease without esophagitis: Secondary | ICD-10-CM

## 2023-11-09 DIAGNOSIS — E538 Deficiency of other specified B group vitamins: Secondary | ICD-10-CM

## 2023-11-09 DIAGNOSIS — R7309 Other abnormal glucose: Secondary | ICD-10-CM

## 2023-11-09 DIAGNOSIS — E66811 Obesity, class 1: Secondary | ICD-10-CM

## 2023-11-09 DIAGNOSIS — E559 Vitamin D deficiency, unspecified: Secondary | ICD-10-CM

## 2023-11-09 DIAGNOSIS — E782 Mixed hyperlipidemia: Secondary | ICD-10-CM

## 2023-11-09 DIAGNOSIS — N1831 Chronic kidney disease, stage 3a: Secondary | ICD-10-CM

## 2023-11-09 MED ORDER — BUPROPION HCL ER (XL) 300 MG PO TB24: 300.0000 mg | ORAL_TABLET | Freq: Every day | ORAL | 4 refills | Status: DC

## 2023-11-09 NOTE — Assessment & Plan Note (Signed)
BMI 33.77.  Recommended eating smaller high protein, low fat meals more frequently and exercising 30 mins a day 5 times a week with a goal of 10-15lb weight loss in the next 3 months. Patient voiced their understanding and motivation to adhere to these recommendations.

## 2023-11-09 NOTE — Patient Instructions (Signed)
Please call to schedule your mammogram and/or bone density: [facility] at [facility]  Address: [address], [place], [place] [address] Phone: [phone]  [facility] [address] [place],  [place]  [address] Phone: [phone]   Heart-Healthy Eating Plan Many factors influence your heart health, including eating and exercise habits. Heart health is also called coronary health. Coronary risk increases with abnormal blood fat (lipid) levels. A heart-healthy eating plan includes limiting unhealthy fats, increasing healthy fats, limiting salt (sodium) intake, and making other diet and lifestyle changes. What is my plan? Your health care provider may recommend that: You limit your fat intake to _________% or less of your total calories each day. You limit your saturated fat intake to _________% or less of your total calories each day. You limit the amount of cholesterol in your diet to less than _________ mg per day. You limit the amount of sodium in your diet to less than _________ mg per day. What are tips for following this plan? Cooking Cook foods using methods other than frying. Baking, boiling, grilling, and broiling are all good options. Other ways to reduce fat include: Removing the skin from poultry. Removing all visible fats from meats. Steaming vegetables in water or broth. Meal planning  At meals, imagine dividing your plate into fourths: Fill one-half of your plate with vegetables and green salads. Fill one-fourth of your plate with whole grains. Fill one-fourth of your plate with lean protein foods. Eat 2-4 cups of vegetables per day. One cup of vegetables equals 1 cup (91 g) broccoli or cauliflower florets, 2 medium carrots, 1 large bell pepper, 1 large sweet potato, 1 large tomato, 1 medium white potato, 2 cups (150 g) raw leafy greens. Eat 1-2 cups of fruit per day. One cup of fruit equals 1 small  apple, 1 large banana, 1 cup (237 g) mixed fruit, 1 large orange,  cup (82 g) dried fruit, 1 cup (240 mL) 100% fruit juice. Eat more foods that contain soluble fiber. Examples include apples, broccoli, carrots, beans, peas, and barley. Aim to get 25-30 g of fiber per day. Increase your consumption of legumes, nuts, and seeds to 4-5 servings per week. One serving of dried beans or legumes equals  cup (90 g) cooked, 1 serving of nuts is  oz (12 almonds, 24 pistachios, or 7 walnut halves), and 1 serving of seeds equals  oz (8 g). Fats Choose healthy fats more often. Choose monounsaturated and polyunsaturated fats, such as olive and canola oils, avocado oil, flaxseeds, walnuts, almonds, and seeds. Eat more omega-3 fats. Choose salmon, mackerel, sardines, tuna, flaxseed oil, and ground flaxseeds. Aim to eat fish at least 2 times each week. Check food labels carefully to identify foods with trans fats or high amounts of saturated fat. Limit saturated fats. These are found in animal products, such as meats, butter, and cream. Plant sources of saturated fats include palm oil, palm kernel oil, and coconut oil. Avoid foods with partially hydrogenated oils in them. These contain trans fats. Examples are stick margarine, some tub margarines, cookies, crackers, and other baked goods. Avoid fried foods. General information Eat more home-cooked food and less restaurant, buffet, and fast food. Limit or avoid alcohol. Limit foods that are high in added sugar and simple starches such as foods made using white refined flour (white breads, pastries, sweets). Lose weight if you are overweight. Losing just 5-10% of your body weight can help your  overall health and prevent diseases such as diabetes and heart disease. Monitor your sodium intake, especially if you have high blood pressure. Talk with your health care provider about your sodium intake. Try to incorporate more vegetarian meals weekly. What foods should I  eat? Fruits All fresh, canned (in natural juice), or frozen fruits. Vegetables Fresh or frozen vegetables (raw, steamed, roasted, or grilled). Green salads. Grains Most grains. Choose whole wheat and whole grains most of the time. Rice and pasta, including brown rice and pastas made with whole wheat. Meats and other proteins Lean, well-trimmed beef, veal, pork, and lamb. Chicken and [place] without skin. All fish and shellfish. Wild duck, rabbit, pheasant, and venison. Egg whites or low-cholesterol egg substitutes. Dried beans, peas, lentils, and tofu. Seeds and most nuts. Dairy Low-fat or nonfat cheeses, including ricotta and mozzarella. Skim or 1% milk (liquid, powdered, or evaporated). Buttermilk made with low-fat milk. Nonfat or low-fat yogurt. Fats and oils Non-hydrogenated (trans-free) margarines. Vegetable oils, including soybean, sesame, sunflower, olive, avocado, peanut, safflower, corn, canola, and cottonseed. Salad dressings or mayonnaise made with a vegetable oil. Beverages Water (mineral or sparkling). Coffee and tea. Unsweetened ice tea. Diet beverages. Sweets and desserts Sherbet, gelatin, and fruit ice. Small amounts of dark chocolate. Limit all sweets and desserts. Seasonings and condiments All seasonings and condiments. The items listed above may not be a complete list of foods and beverages you can eat. Contact a dietitian for more options. What foods should I avoid? Fruits Canned fruit in heavy syrup. Fruit in cream or butter sauce. Fried fruit. Limit coconut. Vegetables Vegetables cooked in cheese, cream, or butter sauce. Fried vegetables. Grains Breads made with saturated or trans fats, oils, or whole milk. Croissants. Sweet rolls. Donuts. High-fat crackers, such as cheese crackers and chips. Meats and other proteins Fatty meats, such as hot dogs, ribs, sausage, bacon, rib-eye roast or steak. High-fat deli meats, such as salami and bologna. Caviar. Domestic duck and  goose. Organ meats, such as liver. Dairy Cream, sour cream, cream cheese, and creamed cottage cheese. Whole-milk cheeses. Whole or 2% milk (liquid, evaporated, or condensed). Whole buttermilk. Cream sauce or high-fat cheese sauce. Whole-milk yogurt. Fats and oils Meat fat, or shortening. Cocoa butter, hydrogenated oils, palm oil, coconut oil, palm kernel oil. Solid fats and shortenings, including bacon fat, salt pork, lard, and butter. Nondairy cream substitutes. Salad dressings with cheese or sour cream. Beverages Regular sodas and any drinks with added sugar. Sweets and desserts Frosting. Pudding. Cookies. Cakes. Pies. Milk chocolate or white chocolate. Buttered syrups. Full-fat ice cream or ice cream drinks. The items listed above may not be a complete list of foods and beverages to avoid. Contact a dietitian for more information. Summary Heart-healthy meal planning includes limiting unhealthy fats, increasing healthy fats, limiting salt (sodium) intake and making other diet and lifestyle changes. Lose weight if you are overweight. Losing just 5-10% of your body weight can help your overall health and prevent diseases such as diabetes and heart disease. Focus on eating a balance of foods, including fruits and vegetables, low-fat or nonfat dairy, lean protein, nuts and legumes, whole grains, and heart-healthy oils and fats. This information is not intended to replace advice given to you by your health care provider. Make sure you discuss any questions you have with your health care provider. Document Revised: 01/13/2022 Document Reviewed: 01/13/2022 Elsevier Patient Education  2024 [employer].

## 2023-11-09 NOTE — Progress Notes (Signed)
BP 126/82   Pulse 76   Temp 97.9 F (36.6 C) (Oral)   Ht 5\' 7"  (1.702 m)   Wt 215 lb 9.6 oz (97.8 kg)   LMP  (LMP Unknown)   SpO2 96%   BMI 33.77 kg/m    Subjective:    Patient ID: Audrey Richard, female    DOB: 01/22/1961, 63 y.o.   MRN: [id number]  HPI: Audrey Richard is a 62 y.o. female presenting on 11/09/2023 for comprehensive medical examination. Current medical complaints include:none  She currently lives with: self Menopausal Symptoms: no  HYPERLIPIDEMIA Continues on Rosuvastatin 20 MG.   Hyperlipidemia status: good compliance Satisfied with current treatment?  yes Side effects:  no Medication compliance: good compliance Supplements: none Aspirin:  no The 10-year ASCVD risk score (Arnett DK, et al., 2019) is: 3.3%   Values used to calculate the score:     Age: 22 years     Sex: Female     Is Non-Hispanic African American: No     Diabetic: No     Tobacco smoker: No     Systolic Blood Pressure: 126 mmHg     Is BP treated: No     HDL Cholesterol: 65 mg/dL     Total Cholesterol: 178 mg/dL  Chest pain:  no Coronary artery disease:  no Family history CAD:  yes Family history early CAD:  no    HYPOTHYROID Taking Levothyroxine 125 MCG daily.   Takes Pepcid PRN and Omeprazole daily for GERD. 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 She is currently taking Gabapentin 300 MG TID and Pepcid for treatment. Has Duonebs as needed -- rarely uses this.     History: Saw Dr. Sherene Sires with pulmonary in past -- suspected to have asthmatic bronchitis and chronic airway cough syndrome.   Visit 08/06/21 with pulmonary and she was referred to ENT who she had initial visit with, Dr.  Delford Field, on 08/19/21 -- treated with Bactrim and Prednisone.  Returned to ENT on 10/08/21 and no changes made.  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   Continues on Singulair nightly for allergies -- also taking Flonase nasal.  COPD status: stable Satisfied with current treatment?: yes Oxygen use: no Dyspnea frequency: occasional Cough frequency: none Rescue inhaler frequency: has been a good while Limitation of activity: no Productive cough: none Last Spirometry: with pulmonary Pneumovax: Up to Date Influenza: Up to Date    CHRONIC PAIN  Currently taking Flexeril and Gabapentin for pain. Past labs noted negative ANA and normal ESR and CRP.  Last imaging 11/23/2019 did note mild degenerative changes to lumbar spine.     Taking Vitamin D and B12 for history of lows. Pain control status: stable Duration: chronic Location: lower back Quality: dull and aching Current Pain Level: 9/10 due to recent boot Previous Pain Level: 7/10 What Activities task can be accomplished with current medication? Work and maintaining her house Previous pain specialty evaluation: no Non-narcotic analgesic meds: yes   DEPRESSION Taking Wellbutrin, Prozac, and Trazodone, which she takes as a sleep aide. Mood status: exacerbated due to having to wear boot to right foot Satisfied with current treatment?: yes Symptom severity: moderate  Duration of current treatment : chronic Side effects:  no Medication compliance: good compliance Psychotherapy/counseling: none Previous psychiatric medications: none Depressed mood: yes Anxious mood: yes due wearing boot Anhedonia: yes Significant weight loss or gain: no Insomnia: none Fatigue: yes due to boot Feelings of worthlessness or guilt: no Impaired concentration/indecisiveness: no Suicidal ideations: no Hopelessness: yes Crying spells: yes    11/09/2023    4:05 PM 01/05/2023    3:44 PM 12/17/2022    2:59 PM 03/17/2022    3:17 PM 12/11/2021     3:54 PM  Depression screen PHQ 2/9  Decreased Interest 3 2 3 2 3   Down, Depressed, Hopeless 3 1 2 1 1   PHQ - 2 Score 6 3 5 3 4   Altered sleeping 2 2 3 3 2   Tired, decreased energy 2 3 3 3    Change in appetite 0 1 2 1 3   Feeling bad or failure about yourself  2 1 2 1 1   Trouble concentrating 0 0 1 0 0  Moving slowly or fidgety/restless 0 0 2 1 0  Suicidal thoughts 0 0 1 0 0  PHQ-9 Score 12 10 19 12 10   Difficult doing work/chores Extremely dIfficult Somewhat difficult Not difficult at all  Somewhat difficult      11/09/2023    4:05 PM 01/05/2023    3:44 PM 12/17/2022    3:00 PM 03/17/2022    3:18 PM  GAD 7 : Generalized Anxiety Score  Nervous, Anxious, on Edge 1 1 2 2   Control/stop worrying 2 2 3 2   Worry too much - different things 3 2 3 2   Trouble relaxing 2 1 2 1   Restless 0 0 1 2  Easily annoyed or irritable 2 2 2 1   Afraid - awful might happen 1 1 1 1   Total GAD 7 Score 11 9 14 11   Anxiety Difficulty Extremely difficult Somewhat difficult Somewhat difficult Very difficult      12/05/2022   12:00 AM 12/05/2022    8:00 AM 12/17/2022    2:59 PM 01/05/2023    3:44 PM 11/09/2023    4:05 PM  Fall Risk  Falls in the past year?   0 0 0  Was there an injury with Fall?   0  0  Fall Risk Category Calculator   0  0  Fall Risk Category (Retired)   Low    (RETIRED) Patient Fall Risk Level Low fall risk Low fall risk     Patient at Risk for Falls Due to   No Fall Risks No Fall Risks No Fall Risks  Fall risk Follow up   Falls evaluation completed Falls evaluation completed Falls evaluation completed     Functional Status Survey: Is the patient deaf or have difficulty hearing?: No Does the patient have difficulty seeing, even when wearing glasses/contacts?: No Does the patient have difficulty concentrating, remembering, or making decisions?: No Does the patient have difficulty walking or climbing stairs?: No Does the patient have difficulty dressing or bathing?: No Does the  patient have difficulty doing errands alone such as visiting a doctor's office or shopping?: No    Past Medical History:  Past Medical History:  Diagnosis Date   Asthma    COPD (chronic obstructive pulmonary disease) (HCC)    Depression    High cholesterol    Osteoporosis    Throat ulcer    Thyroid disease     Surgical History:  Past Surgical History:  Procedure Laterality Date   TUBAL LIGATION      Medications:  Current  Outpatient Medications on File Prior to Visit  Medication Sig   albuterol (PROVENTIL) (2.5 MG/3ML) 0.083% nebulizer solution Inhale 3 mLs (2.5 mg total) into the lungs every 2 (two) hours as needed for shortness of breath or wheezing.   albuterol (VENTOLIN HFA) 108 (90 Base) MCG/ACT inhaler Inhale 2 puffs into the lungs every 6 (six) hours as needed for wheezing or shortness of breath.   Budeson-Glycopyrrol-Formoterol (BREZTRI AEROSPHERE) 160-9-4.8 MCG/ACT AERO Inhale 2 puffs into the lungs 2 (two) times daily.   cetirizine (ZYRTEC) 10 MG tablet Take 1 tablet (10 mg total) by mouth daily.   Cholecalciferol (VITAMIN D3) 1.25 MG (50000 UT) CAPS Take 1 capsule (1.25 mg total) by mouth once a week.   cyclobenzaprine (FLEXERIL) 10 MG tablet TAKE 1 TABLET BY MOUTH AT BEDTIME   famotidine (PEPCID) 20 MG tablet One after supper   FLUoxetine (PROZAC) 20 MG tablet Take 1 tablet (20 mg total) by mouth daily.   fluticasone (FLONASE) 50 MCG/ACT nasal spray Use 2 spray(s) in each nostril once daily   gabapentin (NEURONTIN) 300 MG capsule Take one capsule (300 MG) by mouth in the morning and at noon, then before bedtime take 2 capsules (600 MG) by mouth.   levothyroxine (SYNTHROID) 125 MCG tablet Take 1 tablet (125 mcg total) by mouth daily.   meloxicam (MOBIC) 15 MG tablet Take 1 tablet (15 mg total) by mouth daily.   montelukast (SINGULAIR) 10 MG tablet Take 1 tablet (10 mg total) by mouth at bedtime.   olopatadine (PATADAY) 0.1 % ophthalmic solution Place 1 drop into both  eyes 2 (two) times daily.   omeprazole (PRILOSEC) 40 MG capsule TAKE ONE CAPSULE BY MOUTH EVERY EVENING   rosuvastatin (CRESTOR) 20 MG tablet TAKE ONE TABLET BY MOUTH EVERY DAY   traZODone (DESYREL) 50 MG tablet Take 1 tablet (50 mg total) by mouth at bedtime as needed.   triamcinolone ointment (KENALOG) 0.5 % Apply 1 application. topically 2 (two) times daily.   vitamin B-12 (CYANOCOBALAMIN) 500 MCG tablet Take 1 tablet (500 mcg total) by mouth daily.   No current facility-administered medications on file prior to visit.    Allergies:  Allergies  Allergen Reactions   Biaxin [Clarithromycin]     Patient reports itching, nausea   Iodine     Iodine blisters skin    Social History:  Social History   Socioeconomic History   Marital status: Divorced    Spouse name: Not on file   Number of children: 3   Years of education: Not on file   Highest education level: Not on file  Occupational History   Not on file  Tobacco Use   Smoking status: Former    Current packs/day: 0.00    Types: Cigarettes    Quit date: 11/15/2005    Years since quitting: 17.9   Smokeless tobacco: Never  Vaping Use   Vaping status: Never Used  Substance and Sexual Activity   Alcohol use: Not Currently    Comment: pt states on occasion   Drug use: No   Sexual activity: Yes  Other Topics Concern   Not on file  Social History Narrative   Social determinants completed. HS   Works at [employer] has 3 children.   Social Determinants of Health   Financial Resource Strain: Medium Risk (05/14/2021)   Overall Financial Resource Strain (CARDIA)    Difficulty of Paying Living Expenses: Somewhat hard  Food Insecurity: No Food Insecurity (12/05/2022)   Hunger Vital Sign  Worried About Programme researcher, broadcasting/film/video in the Last Year: Never true    Ran Out of Food in the Last Year: Never true  Transportation Needs: No Transportation Needs (12/05/2022)   PRAPARE - Administrator, Civil Service (Medical): No     Lack of Transportation (Non-Medical): No  Physical Activity: Inactive (09/05/2020)   Exercise Vital Sign    Days of Exercise per Week: 0 days    Minutes of Exercise per Session: 0 min  Stress: Stress Concern Present (05/14/2021)   [employer] of Occupational Health - Occupational Stress Questionnaire    Feeling of Stress : To some extent  Social Connections: Unknown (05/14/2021)   Social Connection and Isolation Panel [NHANES]    Frequency of Communication with Friends and Family: More than three times a week    Frequency of Social Gatherings with Friends and Family: More than three times a week    Attends Religious Services: Never    Database administrator or Organizations: No    Attends Banker Meetings: Never    Marital Status: Patient declined  Catering manager Violence: Not At Risk (12/05/2022)   Humiliation, Afraid, Rape, and Kick questionnaire    Fear of Current or Ex-Partner: No    Emotionally Abused: No    Physically Abused: No    Sexually Abused: No   Social History   Tobacco Use  Smoking Status Former   Current packs/day: 0.00   Types: Cigarettes   Quit date: 11/15/2005   Years since quitting: 17.9  Smokeless Tobacco Never   Social History   Substance and Sexual Activity  Alcohol Use Not Currently   Comment: pt states on occasion    Family History:  Family History  Problem Relation Age of Onset   Heart disease Mother    Heart disease Father    Lung disease Father    Diabetes Sister    Diabetes Maternal Grandmother    Heart disease Maternal Grandmother    Diabetes Maternal Grandfather    Heart disease Maternal Grandfather    Diabetes Sister     Past medical history, surgical history, medications, allergies, family history and social history reviewed with patient today and changes made to appropriate areas of the chart.   ROS All other ROS negative except what is listed above and in the HPI.      Objective:    BP 126/82   Pulse  76   Temp 97.9 F (36.6 C) (Oral)   Ht 5\' 7"  (1.702 m)   Wt 215 lb 9.6 oz (97.8 kg)   LMP  (LMP Unknown)   SpO2 96%   BMI 33.77 kg/m   Wt Readings from Last 3 Encounters:  11/09/23 215 lb 9.6 oz (97.8 kg)  10/22/23 210 lb (95.3 kg)  09/29/23 210 lb (95.3 kg)    Physical Exam Vitals and nursing note reviewed. Exam conducted with a chaperone present.  Constitutional:      General: She is awake. She is not in acute distress.    Appearance: She is well-developed and well-groomed. She is obese. She is not ill-appearing or toxic-appearing.  HENT:     Head: Normocephalic and atraumatic.     Right Ear: Hearing, tympanic membrane, ear canal and external ear normal. No drainage.     Left Ear: Hearing, tympanic membrane, ear canal and external ear normal. No drainage.     Nose: Nose normal.     Right Sinus: No maxillary sinus tenderness or frontal  sinus tenderness.     Left Sinus: No maxillary sinus tenderness or frontal sinus tenderness.     Mouth/Throat:     Mouth: Mucous membranes are moist.     Pharynx: Oropharynx is clear. Uvula midline. No pharyngeal swelling, oropharyngeal exudate or posterior oropharyngeal erythema.  Eyes:     General: Lids are normal.        Right eye: No discharge.        Left eye: No discharge.     Extraocular Movements: Extraocular movements intact.     Conjunctiva/sclera: Conjunctivae normal.     Pupils: Pupils are equal, round, and reactive to light.     Visual Fields: Right eye visual fields normal and left eye visual fields normal.  Neck:     Thyroid: No thyromegaly.     Vascular: No carotid bruit.     Trachea: Trachea normal.  Cardiovascular:     Rate and Rhythm: Normal rate and regular rhythm.     Heart sounds: Normal heart sounds. No murmur heard.    No gallop.  Pulmonary:     Effort: Pulmonary effort is normal. No accessory muscle usage or respiratory distress.     Breath sounds: Normal breath sounds.  Chest:  Breasts:    Right: Normal.      Left: Normal.  Abdominal:     General: Bowel sounds are normal.     Palpations: Abdomen is soft. There is no hepatomegaly or splenomegaly.     Tenderness: There is no abdominal tenderness.  Musculoskeletal:        General: Normal range of motion.     Cervical back: Normal range of motion and neck supple.     Right lower leg: No edema.     Left lower leg: No edema.  Lymphadenopathy:     Head:     Right side of head: No submental, submandibular, tonsillar, preauricular or posterior auricular adenopathy.     Left side of head: No submental, submandibular, tonsillar, preauricular or posterior auricular adenopathy.     Cervical: No cervical adenopathy.     Upper Body:     Right upper body: No supraclavicular, axillary or pectoral adenopathy.     Left upper body: No supraclavicular, axillary or pectoral adenopathy.  Skin:    General: Skin is warm and dry.     Capillary Refill: Capillary refill takes less than 2 seconds.     Findings: No rash.  Neurological:     Mental Status: She is alert and oriented to person, place, and time.     Gait: Gait is intact.     Deep Tendon Reflexes: Reflexes are normal and symmetric.     Reflex Scores:      Brachioradialis reflexes are 2+ on the right side and 2+ on the left side.      Patellar reflexes are 2+ on the right side and 2+ on the left side. Psychiatric:        Attention and Perception: Attention normal.        Mood and Affect: Mood normal.        Speech: Speech normal.        Behavior: Behavior normal. Behavior is cooperative.        Thought Content: Thought content normal.        Judgment: Judgment normal.    Results for orders placed or performed in visit on 12/17/22  Comprehensive metabolic panel  Result Value Ref Range   Glucose 84 70 - 99 mg/dL  BUN 12 8 - 27 mg/dL   Creatinine, Ser 4.25 (H) 0.57 - 1.00 mg/dL   eGFR 54 (L) >95 GL/OVF/6.43   BUN/Creatinine Ratio 10 (L) 12 - 28   Sodium 139 134 - 144 mmol/L   Potassium 3.9 3.5 -  5.2 mmol/L   Chloride 100 96 - 106 mmol/L   CO2 23 20 - 29 mmol/L   Calcium 8.9 8.7 - 10.3 mg/dL   Total Protein 6.3 6.0 - 8.5 g/dL   Albumin 4.3 3.9 - 4.9 g/dL   Globulin, Total 2.0 1.5 - 4.5 g/dL   Albumin/Globulin Ratio 2.2 1.2 - 2.2   Bilirubin Total 0.6 0.0 - 1.2 mg/dL   Alkaline Phosphatase 62 44 - 121 IU/L   AST 16 0 - 40 IU/L   ALT 21 0 - 32 IU/L  Vitamin B12  Result Value Ref Range   Vitamin B-12 509 232 - 1,245 pg/mL  Lipid Panel w/o Chol/HDL Ratio  Result Value Ref Range   Cholesterol, Total 178 100 - 199 mg/dL   Triglycerides 329 0 - 149 mg/dL   HDL 65 >51 mg/dL   VLDL Cholesterol Cal 21 5 - 40 mg/dL   LDL Chol Calc (NIH) 92 0 - 99 mg/dL  HgB O8C  Result Value Ref Range   Hgb A1c MFr Bld 6.0 (H) 4.8 - 5.6 %   Est. average glucose Bld gHb Est-mCnc 126 mg/dL  CBC with Differential/Platelet  Result Value Ref Range   WBC 6.3 3.4 - 10.8 x10E3/uL   RBC 4.48 3.77 - 5.28 x10E6/uL   Hemoglobin 13.9 11.1 - 15.9 g/dL   Hematocrit 16.6 06.3 - 46.6 %   MCV 92 79 - 97 fL   MCH 31.0 26.6 - 33.0 pg   MCHC 33.7 31.5 - 35.7 g/dL   RDW 01.6 01.0 - 93.2 %   Platelets 225 150 - 450 x10E3/uL   Neutrophils 77 Not Estab. %   Lymphs 9 Not Estab. %   Monocytes 7 Not Estab. %   Eos 6 Not Estab. %   Basos 1 Not Estab. %   Neutrophils Absolute 4.9 1.4 - 7.0 x10E3/uL   Lymphocytes Absolute 0.6 (L) 0.7 - 3.1 x10E3/uL   Monocytes Absolute 0.5 0.1 - 0.9 x10E3/uL   EOS (ABSOLUTE) 0.4 0.0 - 0.4 x10E3/uL   Basophils Absolute 0.0 0.0 - 0.2 x10E3/uL   Immature Granulocytes 0 Not Estab. %   Immature Grans (Abs) 0.0 0.0 - 0.1 x10E3/uL      Assessment & Plan:   Problem List Items Addressed This Visit       Cardiovascular and Mediastinum   Aortic atherosclerosis (HCC)    Chronic.  Noted on imaging 12/30/2020 -- continue statin therapy.        Respiratory   Asthmatic bronchitis , chronic (HCC) - Primary    Chronic, ongoing.  She has improved symptoms with Markus Daft on board, will  maintain this and continue to work with PharmD on cost assistance.  Continue Albuterol PRN and nebs at home.  Would benefit return to pulmonary and ENT, but refuses at this time.          Digestive   Acid reflux    Chronic, ongoing, using Pepcid & Omeprazole.  Continue current medication regimen and adjust as needed.  Mag level today.  Risks of PPI use were discussed with patient including bone loss, C. Diff diarrhea, pneumonia, infections, CKD, electrolyte abnormalities.  Verbalizes understanding and chooses to continue the medication.  Relevant Orders   Magnesium     Endocrine   Hashimoto's thyroiditis    Chronic, ongoing.  Continue current medication regimen and adjust as needed based on labs. Recheck TSH, Free T4 today.       Relevant Orders   TSH     Genitourinary   Chronic kidney disease, stage 3a (HCC)    Ongoing, stable.  Recheck labs today.  Recommend good hydration at home.  Avoid Ibuprofen products.      Relevant Orders   CBC with Differential/Platelet   Comprehensive metabolic panel   Microalbumin, Urine Waived     Other   B12 deficiency    Chronic, ongoing.  Continue on supplement and adjust as needed. Check level today.      Relevant Orders   Vitamin B12   Depression    Chronic, exacerbated by wearing boot to right foot.  Denies SI/HI.  At this time will increase Wellbutrin to XL 300 MG daily and maintain Prozac + Trazodone as needed for sleep.  Could consider in future discontinuation of Prozac and change to Duloxetine which may also benefit her chronic pain, along with mood.  Return in 8 weeks for follow-up/      Relevant Medications   buPROPion (WELLBUTRIN XL) 300 MG 24 hr tablet   Elevated hemoglobin A1c    A1c check today due to past elevations, initiate medication as needed.      Relevant Orders   HgB A1c   Insomnia    Chronic, ongoing.  At this time will continue Trazodone as needed for sleep.  Recommend she trial Melatonin vs taking  Trazodone -- she reports will try this.  Could consider sleep study in future if ongoing issue.       Mixed hyperlipidemia    Chronic, ongoing.  Continue current medication regimen and adjust as needed.  Lipid panel today.      Relevant Orders   Comprehensive metabolic panel   Lipid Panel w/o Chol/HDL Ratio   Obesity    BMI 33.77.  Recommended eating smaller high protein, low fat meals more frequently and exercising 30 mins a day 5 times a week with a goal of 10-15lb weight loss in the next 3 months. Patient voiced their understanding and motivation to adhere to these recommendations.       Vitamin D deficiency    Chronic, ongoing.  Continue on supplement and adjust as needed. Check level today.      Relevant Orders   VITAMIN D 25 Hydroxy (Vit-D Deficiency, Fractures)   Other Visit Diagnoses     Encounter for screening mammogram for malignant neoplasm of breast       Mammogram ordered.   Relevant Orders   MM 3D SCREENING MAMMOGRAM BILATERAL BREAST   Encounter for annual physical exam       Annual physical today, health maintenance reviewed with patient.        Follow up plan: Return in about 8 weeks (around 01/04/2024) for Depression -- increased Wellbutrin.   LABORATORY TESTING:  - Pap smear: up to date  IMMUNIZATIONS:   - Tdap: Tetanus vaccination status reviewed: last tetanus booster within 10 years. - Influenza: Up to date - Pneumovax: Not applicable - Prevnar: Not applicable - COVID: Up to date - HPV: Not applicable - Shingrix vaccine: Refused  SCREENING: -Mammogram: Ordered today  - Colonoscopy: Up to date  - Bone Density: Not applicable  -Hearing Test: Not applicable  -Spirometry: Not applicable   PATIENT COUNSELING:   Advised  to take 1 mg of folate supplement per day if capable of pregnancy.   Sexuality: Discussed sexually transmitted diseases, partner selection, use of condoms, avoidance of unintended pregnancy  and contraceptive alternatives.    Advised to avoid cigarette smoking.  I discussed with the patient that most people either abstain from alcohol or drink within safe limits (<=14/week and <=4 drinks/occasion for males, <=7/weeks and <= 3 drinks/occasion for females) and that the risk for alcohol disorders and other health effects rises proportionally with the number of drinks per week and how often a drinker exceeds daily limits.  Discussed cessation/primary prevention of drug use and availability of treatment for abuse.   Diet: Encouraged to adjust caloric intake to maintain  or achieve ideal body weight, to reduce intake of dietary saturated fat and total fat, to limit sodium intake by avoiding high sodium foods and not adding table salt, and to maintain adequate dietary potassium and calcium preferably from fresh fruits, vegetables, and low-fat dairy products.    Stressed the importance of regular exercise  Injury prevention: Discussed safety belts, safety helmets, smoke detector, smoking near bedding or upholstery.   Dental health: Discussed importance of regular tooth brushing, flossing, and dental visits.    NEXT PREVENTATIVE PHYSICAL DUE IN 1 YEAR. Return in about 8 weeks (around 01/04/2024) for Depression -- increased Wellbutrin.

## 2023-11-09 NOTE — Assessment & Plan Note (Signed)
Chronic, ongoing.  Continue on supplement and adjust as needed. Check level today. 

## 2023-11-09 NOTE — Assessment & Plan Note (Signed)
Chronic, ongoing.  Continue current medication regimen and adjust as needed. Lipid panel today. 

## 2023-11-09 NOTE — Assessment & Plan Note (Signed)
A1c check today due to past elevations, initiate medication as needed.

## 2023-11-09 NOTE — Assessment & Plan Note (Signed)
Chronic, ongoing.  Continue current medication regimen and adjust as needed based on labs. Recheck TSH, Free T4 today. ? ?

## 2023-11-09 NOTE — Assessment & Plan Note (Signed)
Chronic, exacerbated by wearing boot to right foot.  Denies SI/HI.  At this time will increase Wellbutrin to XL 300 MG daily and maintain Prozac + Trazodone as needed for sleep.  Could consider in future discontinuation of Prozac and change to Duloxetine which may also benefit her chronic pain, along with mood.  Return in 8 weeks for follow-up/

## 2023-11-09 NOTE — Assessment & Plan Note (Signed)
Chronic, ongoing.  She has improved symptoms with Markus Daft on board, will maintain this and continue to work with PharmD on cost assistance.  Continue Albuterol PRN and nebs at home.  Would benefit return to pulmonary and ENT, but refuses at this time.

## 2023-11-09 NOTE — Assessment & Plan Note (Signed)
Chronic.  Noted on imaging 12/30/2020 -- continue statin therapy.

## 2023-11-09 NOTE — Assessment & Plan Note (Signed)
Chronic, ongoing.  At this time will continue Trazodone as needed for sleep.  Recommend she trial Melatonin vs taking Trazodone -- she reports will try this.  Could consider sleep study in future if ongoing issue.  ?

## 2023-11-09 NOTE — Assessment & Plan Note (Addendum)
Ongoing, stable.  Recheck labs today.  Recommend good hydration at home.  Avoid Ibuprofen products.

## 2023-11-09 NOTE — Assessment & Plan Note (Signed)
Chronic, ongoing, using Pepcid & Omeprazole.  Continue current medication regimen and adjust as needed.  Mag level today.  Risks of PPI use were discussed with patient including bone loss, C. Diff diarrhea, pneumonia, infections, CKD, electrolyte abnormalities.  Verbalizes understanding and chooses to continue the medication.

## 2023-11-10 LAB — CBC WITH DIFFERENTIAL/PLATELET
Basophils Absolute: 0 10*3/uL (ref 0.0–0.2)
Basos: 1 %
EOS (ABSOLUTE): 0.3 10*3/uL (ref 0.0–0.4)
Eos: 4 %
Hematocrit: 39.4 % (ref 34.0–46.6)
Hemoglobin: 12.9 g/dL (ref 11.1–15.9)
Immature Grans (Abs): 0 10*3/uL (ref 0.0–0.1)
Immature Granulocytes: 0 %
Lymphocytes Absolute: 0.8 10*3/uL (ref 0.7–3.1)
Lymphs: 14 %
MCH: 30.4 pg (ref 26.6–33.0)
MCHC: 32.7 g/dL (ref 31.5–35.7)
MCV: 93 fL (ref 79–97)
Monocytes Absolute: 0.4 10*3/uL (ref 0.1–0.9)
Monocytes: 7 %
Neutrophils Absolute: 4.6 10*3/uL (ref 1.4–7.0)
Neutrophils: 74 %
Platelets: 248 10*3/uL (ref 150–450)
RBC: 4.25 x10E6/uL (ref 3.77–5.28)
RDW: 14 % (ref 11.7–15.4)
WBC: 6.1 10*3/uL (ref 3.4–10.8)

## 2023-11-10 LAB — MICROALBUMIN, URINE WAIVED
Creatinine, Urine Waived: 200 mg/dL (ref 10–300)
Microalb, Ur Waived: 30 mg/L — ABNORMAL HIGH (ref 0–19)
Microalb/Creat Ratio: <30 mg/g (ref <30)

## 2023-11-10 LAB — HEMOGLOBIN A1C
Est. average glucose Bld gHb Est-mCnc: 126 mg/dL
Hgb A1c MFr Bld: 6 % — ABNORMAL HIGH (ref 4.8–5.6)

## 2023-11-10 LAB — COMPREHENSIVE METABOLIC PANEL
ALT: 21 [IU]/L (ref 0–32)
AST: 24 [IU]/L (ref 0–40)
Albumin: 4.2 g/dL (ref 3.9–4.9)
Alkaline Phosphatase: 53 [IU]/L (ref 44–121)
BUN/Creatinine Ratio: 14 (ref 12–28)
BUN: 15 mg/dL (ref 8–27)
Bilirubin Total: 0.3 mg/dL (ref 0.0–1.2)
CO2: 24 mmol/L (ref 20–29)
Calcium: 8.9 mg/dL (ref 8.7–10.3)
Chloride: 104 mmol/L (ref 96–106)
Creatinine, Ser: 1.05 mg/dL — ABNORMAL HIGH (ref 0.57–1.00)
Globulin, Total: 2 g/dL (ref 1.5–4.5)
Glucose: 90 mg/dL (ref 70–99)
Potassium: 4 mmol/L (ref 3.5–5.2)
Sodium: 142 mmol/L (ref 134–144)
Total Protein: 6.2 g/dL (ref 6.0–8.5)
eGFR: 60 mL/min/{1.73_m2} (ref >59)

## 2023-11-10 LAB — VITAMIN D 25 HYDROXY (VIT D DEFICIENCY, FRACTURES): Vit D, 25-Hydroxy: 74.7 ng/mL (ref 30.0–100.0)

## 2023-11-10 LAB — LIPID PANEL W/O CHOL/HDL RATIO
Cholesterol, Total: 149 mg/dL (ref 100–199)
HDL: 64 mg/dL (ref >39)
LDL Chol Calc (NIH): 63 mg/dL (ref 0–99)
Triglycerides: 128 mg/dL (ref 0–149)
VLDL Cholesterol Cal: 22 mg/dL (ref 5–40)

## 2023-11-10 LAB — TSH: TSH: 0.615 u[IU]/mL (ref 0.450–4.500)

## 2023-11-10 LAB — VITAMIN B12: Vitamin B-12: 344 pg/mL (ref 232–1245)

## 2023-11-10 LAB — MAGNESIUM: Magnesium: 2 mg/dL (ref 1.6–2.3)

## 2023-11-10 NOTE — Progress Notes (Signed)
Contacted via MyChart   Good evening Audrey Richard, your labs have returned: - Kidney function, creatinine and eGFR, remains stable, as is liver function, AST and ALT.  - A1c remains in prediabetic range, goal is not to get this above 6.5% and work on lowering to less then 5.7%.  Focus heavily on diet changes and regular activity. - Reminder of labs stable with exception of lower side B12, try taking B12 1000 MCG daily for nervous system health.  No medication changes needed.  Any questions? Keep being amazing!!  Thank you for allowing me to participate in your care.  I appreciate you. Kindest regards, Debera Sterba

## 2023-11-24 ENCOUNTER — Ambulatory Visit (INDEPENDENT_AMBULATORY_CARE_PROVIDER_SITE_OTHER): Payer: BC Managed Care – PPO | Admitting: Podiatry

## 2023-11-24 ENCOUNTER — Encounter: Payer: Self-pay | Admitting: Podiatry

## 2023-11-24 VITALS — Ht 67.0 in | Wt 215.6 lb

## 2023-11-24 DIAGNOSIS — M7671 Peroneal tendinitis, right leg: Secondary | ICD-10-CM

## 2023-11-24 DIAGNOSIS — M7661 Achilles tendinitis, right leg: Secondary | ICD-10-CM | POA: Diagnosis not present

## 2023-11-24 NOTE — Progress Notes (Signed)
Subjective:  Patient ID: Audrey Richard, female    DOB: January 14, 1961,   MRN: [id number]  Chief Complaint  Patient presents with   Foot Pain    Pt is f/u on right foot pain states she is still having a lot of pain    62 y.o. female presents with the above complaint.  Patient Audrey Richard for follow-up of peroneal tendinitis.  She states she is doing a lot better from the peroneal.  Her Achilles tendon is now hurting.  She wanted discuss treatment options for that   Review of Systems: Negative except as noted in the HPI. Denies N/V/F/Ch.  Past Medical History:  Diagnosis Date   Asthma    COPD (chronic obstructive pulmonary disease) (HCC)    Depression    High cholesterol    Osteoporosis    Throat ulcer    Thyroid disease     Current Outpatient Medications:    albuterol (PROVENTIL) (2.5 MG/3ML) 0.083% nebulizer solution, Inhale 3 mLs (2.5 mg total) into the lungs every 2 (two) hours as needed for shortness of breath or wheezing., Disp: 75 mL, Rfl: 5   albuterol (VENTOLIN HFA) 108 (90 Base) MCG/ACT inhaler, Inhale 2 puffs into the lungs every 6 (six) hours as needed for wheezing or shortness of breath., Disp: 18 g, Rfl: 4   Budeson-Glycopyrrol-Formoterol (BREZTRI AEROSPHERE) 160-9-4.8 MCG/ACT AERO, Inhale 2 puffs into the lungs 2 (two) times daily., Disp: 10.7 g, Rfl: 11   buPROPion (WELLBUTRIN XL) 300 MG 24 hr tablet, Take 1 tablet (300 mg total) by mouth daily., Disp: 90 tablet, Rfl: 4   cetirizine (ZYRTEC) 10 MG tablet, Take 1 tablet (10 mg total) by mouth daily., Disp: 90 tablet, Rfl: 4   Cholecalciferol (VITAMIN D3) 1.25 MG (50000 UT) CAPS, Take 1 capsule (1.25 mg total) by mouth once a week., Disp: 12 capsule, Rfl: 4   cyclobenzaprine (FLEXERIL) 10 MG tablet, TAKE 1 TABLET BY MOUTH AT BEDTIME, Disp: 90 tablet, Rfl: 4   famotidine (PEPCID) 20 MG tablet, One after supper, Disp: 30 tablet, Rfl: 11   FLUoxetine (PROZAC) 20 MG tablet, Take 1 tablet (20 mg total) by mouth daily., Disp: 90  tablet, Rfl: 4   fluticasone (FLONASE) 50 MCG/ACT nasal spray, Use 2 spray(s) in each nostril once daily, Disp: 16 g, Rfl: 4   gabapentin (NEURONTIN) 300 MG capsule, Take one capsule (300 MG) by mouth in the morning and at noon, then before bedtime take 2 capsules (600 MG) by mouth., Disp: 360 capsule, Rfl: 4   levothyroxine (SYNTHROID) 125 MCG tablet, Take 1 tablet (125 mcg total) by mouth daily., Disp: 90 tablet, Rfl: 4   meloxicam (MOBIC) 15 MG tablet, Take 1 tablet (15 mg total) by mouth daily., Disp: 30 tablet, Rfl: 0   montelukast (SINGULAIR) 10 MG tablet, Take 1 tablet (10 mg total) by mouth at bedtime., Disp: 90 tablet, Rfl: 4   omeprazole (PRILOSEC) 40 MG capsule, TAKE ONE CAPSULE BY MOUTH EVERY EVENING, Disp: , Rfl:    rosuvastatin (CRESTOR) 20 MG tablet, TAKE ONE TABLET BY MOUTH EVERY DAY, Disp: 90 tablet, Rfl: 4   traZODone (DESYREL) 50 MG tablet, Take 1 tablet (50 mg total) by mouth at bedtime as needed., Disp: 30 tablet, Rfl: 5   vitamin B-12 (CYANOCOBALAMIN) 500 MCG tablet, Take 1 tablet (500 mcg total) by mouth daily., Disp: 90 tablet, Rfl: 4  Social History   Tobacco Use  Smoking Status Former   Current packs/day: 0.00   Types: Cigarettes   Quit  date: 11/15/2005   Years since quitting: 18.0  Smokeless Tobacco Never    Allergies  Allergen Reactions   Biaxin [Clarithromycin]     Patient reports itching, nausea   Iodine     Iodine blisters skin   Objective:  There were no vitals filed for this visit. Body mass index is 33.77 kg/m. Constitutional Well developed. Well nourished.  Vascular Dorsalis pedis pulses palpable bilaterally. Posterior tibial pulses palpable bilaterally. Capillary refill normal to all digits.  No cyanosis or clubbing noted. Pedal hair growth normal.  Neurologic Normal speech. Oriented to person, place, and time. Epicritic sensation to light touch grossly present bilaterally.  Dermatologic Nails well groomed and normal in appearance. No  open wounds. No skin lesions.  Orthopedic: No further pain on palpation along the course of the peroneal tendon no pain at the insertion mild pain with dorsiflexion eversion of the foot resisted no pain with plantarflexion inversion of the foot.  No pain at the ATFL ligament Achilles tendon posterior tibial tendon  Pain on palpation of right Achilles tendon insertion.  Pain with range of motion of the ankle joint pain with dorsiflexion of the ankle joint no pain with plantarflexion of the ankle joint.  No deep intra-articular ankle pain noted.   Radiographs: None Assessment:   1. Peroneal tendinitis, right   2. Achilles tendinitis, right leg     Plan:  Patient was evaluated and treated and all questions answered.  Right Achilles tendinitis -Clinically this may have gotten aggravated due to compensation given the amount of pain that she is having she will benefit from a steroid injection of decrease inflammatory component associate with pain.  I discussed the risk of rupture associate with that she states understanding would like to proceed with injection. -A steroid injection was performed at left Kager's fat pad using 1% plain Lidocaine and 10 mg of Kenalog. This was well tolerated.   Right peroneal tendinitis -Clinically healed.  At this time I encouraged her to come out of the cam boot and transition to Tri-Lock ankle brace and regular shoes.  Tri-Lock ankle brace was dispensed. No follow-ups on file.

## 2023-11-26 ENCOUNTER — Telehealth: Payer: Self-pay | Admitting: Podiatry

## 2023-11-26 NOTE — Telephone Encounter (Signed)
Received Dr. Eliane Decree confirmation of the RTW date update to 01/06/2024.  Faxed this update and the 12/03 office notes to [facility] [phone]) ....    J. Abbott -- 11/26/2023

## 2023-11-26 NOTE — Telephone Encounter (Signed)
(  11/25/2023) -- Received call from Dot Lanes - sd saw Dr. Charlsie Merles the previous day (11/24/23) and he extended her RTW date to 01/06/2024.  She also said that Loletta Parish needs this change verified and needs the doctors office visit notes from the same day.   Reached out to Dr. Allena Katz and requested confirmation ...     J. Abbott -- 11/26/2023

## 2023-12-06 ENCOUNTER — Other Ambulatory Visit: Payer: Self-pay | Admitting: Nurse Practitioner

## 2023-12-06 DIAGNOSIS — F324 Major depressive disorder, single episode, in partial remission: Secondary | ICD-10-CM

## 2023-12-07 NOTE — Telephone Encounter (Signed)
Requested Prescriptions  Pending Prescriptions Disp Refills   traZODone (DESYREL) 50 MG tablet [Pharmacy Med Name: traZODone HCl 50 MG Oral Tablet] 30 tablet 2    Sig: TAKE 1 TABLET BY MOUTH AT BEDTIME AS NEEDED     Psychiatry: Antidepressants - Serotonin Modulator Passed - 12/07/2023  3:26 PM      Passed - Completed PHQ-2 or PHQ-9 in the last 360 days      Passed - Valid encounter within last 6 months    Recent Outpatient Visits           4 weeks ago Asthmatic bronchitis , chronic (HCC)   [place] [facility] [place], Corrie Dandy T, NP   11 months ago Flu vaccine need   [place] [facility] [place], Raven T, NP   11 months ago COPD exacerbation (HCC)   [place] [facility] [place], Corrie Dandy T, NP   1 year ago Asthmatic bronchitis , chronic (HCC)   [place] [facility] [place], Corrie Dandy T, NP   1 year ago Asthmatic bronchitis , chronic (HCC)   [place] [facility] [place], Dorie Rank, NP       Future Appointments             In 4 weeks [place], Dorie Rank, NP [place] [facility]

## 2024-01-02 NOTE — Patient Instructions (Signed)
 Managing Depression, Adult Depression is a mental health condition that affects your thoughts, feelings, and actions. Being diagnosed with depression can bring you relief if you did not know why you have felt or behaved a certain way. It could also leave you feeling overwhelmed. Finding ways to manage your symptoms can help you feel more positive about your future. How to manage lifestyle changes Being depressed is difficult. Depression can increase the level of everyday stress. Stress can make depression symptoms worse. You may believe your symptoms cannot be managed or will never improve. However, there are many things you can try to help manage your symptoms. There is hope. Managing stress  Stress is your body's reaction to life changes and events, both good and bad. Stress can add to your feelings of depression. Learning to manage your stress can help lessen your feelings of depression. Try some of the following approaches to reducing your stress (stress reduction techniques): Listen to music that you enjoy and that inspires you. Try using a meditation app or take a meditation class. Develop a practice that helps you connect with your spiritual self. Walk in nature, pray, or go to a place of worship. Practice deep breathing. To do this, inhale slowly through your nose. Pause at the top of your inhale for a few seconds and then exhale slowly, letting yourself relax. Repeat this three or four times. Practice yoga to help relax and work your muscles. Choose a stress reduction technique that works for you. These techniques take time and practice to develop. Set aside 5-15 minutes a day to do them. Therapists can offer training in these techniques. Do these things to help manage stress: Keep a journal. Know your limits. Set healthy boundaries for yourself and others, such as saying "no" when you think something is too much. Pay attention to how you react to certain situations. You may not be able to  control everything, but you can change your reaction. Add humor to your life by watching funny movies or shows. Make time for activities that you enjoy and that relax you. Spend less time using electronics, especially at night before bed. The light from screens can make your brain think it is time to get up rather than go to bed.  Medicines Medicines, such as antidepressants, are often a part of treatment for depression. Talk with your pharmacist or health care provider about all the medicines, supplements, and herbal products that you take, their possible side effects, and what medicines and other products are safe to take together. Make sure to report any side effects you may have to your health care provider. Relationships Your health care provider may suggest family therapy, couples therapy, or individual therapy as part of your treatment. How to recognize changes Everyone responds differently to treatment for depression. As you recover from depression, you may start to: Have more interest in doing activities. Feel more hopeful. Have more energy. Eat a more regular amount of food. Have better mental focus. It is important to recognize if your depression is not getting better or is getting worse. The symptoms you had in the beginning may return, such as: Feeling tired. Eating too much or too little. Sleeping too much or too little. Feeling restless, agitated, or hopeless. Trouble focusing or making decisions. Having unexplained aches and pains. Feeling irritable, angry, or aggressive. If you or your family members notice these symptoms coming back, let your health care provider know right away. Follow these instructions at home: Activity Try to  get some form of exercise each day, such as walking. Try yoga, mindfulness, or other stress reduction techniques. Participate in group activities if you are able. Lifestyle Get enough sleep. Cut down on or stop using caffeine, tobacco,  alcohol, and any other harmful substances. Eat a healthy diet that includes plenty of vegetables, fruits, whole grains, low-fat dairy products, and lean protein. Limit foods that are high in solid fats, added sugar, or salt (sodium). General instructions Take over-the-counter and prescription medicines only as told by your health care provider. Keep all follow-up visits. It is important for your health care provider to check on your mood, behavior, and medicines. Your health care provider may need to make changes to your treatment. Where to find support Talking to others  Friends and family members can be sources of support and guidance. Talk to trusted friends or family members about your condition. Explain your symptoms and let them know that you are working with a health care provider to treat your depression. Tell friends and family how they can help. Finances Find mental health providers that fit with your financial situation. Talk with your health care provider if you are worried about access to food, housing, or medicine. Call your insurance company to learn about your co-pays and prescription plan. Where to find more information You can find support in your area from: Anxiety and Depression Association of America (ADAA): adaa.org Mental Health [place]: mentalhealthamerica.net [employer] on Mental Illness: nami.org Contact a health care provider if: You stop taking your antidepressant medicines, and you have any of these symptoms: Nausea. Headache. Light-headedness. Chills and body aches. Not being able to sleep (insomnia). You or your friends and family think your depression is getting worse. Get help right away if: You have thoughts of hurting yourself or others. Get help right away if you feel like you may hurt yourself or others, or have thoughts about taking your own life. Go to your nearest emergency room or: Call 911. Call the National Suicide Prevention Lifeline at  (215) 435-0408 or 988. This is open 24 hours a day. Text the Crisis Text Line at 318 581 7774. This information is not intended to replace advice given to you by your health care provider. Make sure you discuss any questions you have with your health care provider. Document Revised: 04/15/2022 Document Reviewed: 04/15/2022 Elsevier Patient Education  2024 [employer].

## 2024-01-04 ENCOUNTER — Encounter: Payer: Self-pay | Admitting: Nurse Practitioner

## 2024-01-04 ENCOUNTER — Ambulatory Visit (INDEPENDENT_AMBULATORY_CARE_PROVIDER_SITE_OTHER): Payer: BC Managed Care – PPO | Admitting: Nurse Practitioner

## 2024-01-04 VITALS — BP 125/83 | HR 90 | Temp 98.6°F | Ht 67.0 in | Wt 212.0 lb

## 2024-01-04 DIAGNOSIS — B3731 Acute candidiasis of vulva and vagina: Secondary | ICD-10-CM

## 2024-01-04 DIAGNOSIS — M2550 Pain in unspecified joint: Secondary | ICD-10-CM | POA: Diagnosis not present

## 2024-01-04 DIAGNOSIS — F324 Major depressive disorder, single episode, in partial remission: Secondary | ICD-10-CM | POA: Diagnosis not present

## 2024-01-04 MED ORDER — FLUCONAZOLE 150 MG PO TABS: 150.0000 mg | ORAL_TABLET | Freq: Once | ORAL | 1 refills | Status: AC

## 2024-01-04 NOTE — Assessment & Plan Note (Signed)
 Ongoing for long while -- more to back, knees, and hands.  Knees are the worst.  ANA negative in past.  Is on feet long periods at work.  Discussed options.  She wishes to hold off on imaging or ortho referral at present.  Recommend she try OTC Voltaren gel, has not used this yet.  Educated her on this.  Tylenol  as needed.  When on feet for long periods recommend she use knee sleeves for support.  If worsening or ongoing then consider imaging or referral to ortho.

## 2024-01-04 NOTE — Progress Notes (Signed)
 BP 125/83   Pulse 90   Temp 98.6 F (37 C) (Oral)   Ht 5' 7 (1.702 m)   Wt 212 lb (96.2 kg)   LMP  (LMP Unknown)   SpO2 94%   BMI 33.20 kg/m    Subjective:    Patient ID: Audrey Richard, female    DOB: 1961-04-21, 63 y.o.   MRN: [id number]  HPI: Audrey Richard is a 63 y.o. female  Chief Complaint  Patient presents with   Depression   Reports having yeast infection today, would like oral treatment.  DEPRESSION Follow-up today as increased Wellbutrin  XL 300 MG.  She is noticing improvement with this, less anhedonia and biting at people. Mood status: better Satisfied with current treatment?: yes Symptom severity: moderate  Duration of current treatment : chronic Side effects: no Medication compliance: good compliance Psychotherapy/counseling: none Depressed mood: no Anxious mood: no Anhedonia: no Significant weight loss or gain: no Insomnia: none Fatigue: no Feelings of worthlessness or guilt: no Impaired concentration/indecisiveness: no Suicidal ideations: no Hopelessness: no Crying spells: no    01/04/2024    3:22 PM 11/09/2023    4:05 PM 01/05/2023    3:44 PM 12/17/2022    2:59 PM 03/17/2022    3:17 PM  Depression screen PHQ 2/9  Decreased Interest 2 3 2 3 2   Down, Depressed, Hopeless 1 3 1 2 1   PHQ - 2 Score 3 6 3 5 3   Altered sleeping 0 2 2 3 3   Tired, decreased energy 2 2 3 3 3   Change in appetite 0 0 1 2 1   Feeling bad or failure about yourself  1 2 1 2 1   Trouble concentrating 0 0 0 1 0  Moving slowly or fidgety/restless 0 0 0 2 1  Suicidal thoughts 0 0 0 1 0  PHQ-9 Score 6 12 10 19 12   Difficult doing work/chores Somewhat difficult Extremely dIfficult Somewhat difficult Not difficult at all       01/04/2024    3:23 PM 11/09/2023    4:05 PM 01/05/2023    3:44 PM 12/17/2022    3:00 PM  GAD 7 : Generalized Anxiety Score  Nervous, Anxious, on Edge 1 1 1 2   Control/stop worrying 1 2 2 3   Worry too much - different things 1 3 2 3   Trouble  relaxing 0 2 1 2   Restless 0 0 0 1  Easily annoyed or irritable 0 2 2 2   Afraid - awful might happen 0 1 1 1   Total GAD 7 Score 3 11 9 14   Anxiety Difficulty Somewhat difficult Extremely difficult Somewhat difficult Somewhat difficult   ARTHRALGIAS / JOINT ACHES Has been struggling with joint pain for a long while.  ANA in 2022 was negative.  Notices this is worse with cold weather. Duration: months Pain: yes Symmetric: yes  8/10 knees are the worst Quality: dull, aching, and throbbing Frequency: constant to the knees Context:  worse Decreased function/range of motion: yes Erythema: no Swelling: occasionally Heat or warmth: no Morning stiffness: yes Aggravating factors: movement, getting up from sitting down, long periods on feet Alleviating factors: nothing Relief with NSAIDs?: recently took Meloxicam  without much benefit Treatments attempted:  rest, heat, and APAP  -- Icy/Hot    Hands: yes bilateral    Wrists: no    Elbows: no    Shoulders: no    Back: yes history of OA noted on past imaging to thoracic and lumbar    Hips: yes  bilateral    Knees: yes bilateral -- left is worse then right    Ankles: no    Feet: no  Relevant past medical, surgical, family and social history reviewed and updated as indicated. Interim medical history since our last visit reviewed. Allergies and medications reviewed and updated.  Review of Systems  Constitutional:  Negative for activity change, appetite change, diaphoresis, fatigue and fever.  Respiratory:  Negative for cough, chest tightness, shortness of breath and wheezing.   Cardiovascular:  Negative for chest pain, palpitations and leg swelling.  Musculoskeletal:  Positive for arthralgias.  Neurological: Negative.   Psychiatric/Behavioral: Negative.      Per HPI unless specifically indicated above     Objective:    BP 125/83   Pulse 90   Temp 98.6 F (37 C) (Oral)   Ht 5' 7 (1.702 m)   Wt 212 lb (96.2 kg)   LMP  (LMP  Unknown)   SpO2 94%   BMI 33.20 kg/m   Wt Readings from Last 3 Encounters:  01/04/24 212 lb (96.2 kg)  11/24/23 215 lb 9.6 oz (97.8 kg)  11/09/23 215 lb 9.6 oz (97.8 kg)    Physical Exam Vitals and nursing note reviewed.  Constitutional:      General: She is awake. She is not in acute distress.    Appearance: She is well-developed and well-groomed. She is obese. She is not ill-appearing or toxic-appearing.  HENT:     Head: Normocephalic.     Right Ear: Hearing and external ear normal.     Left Ear: Hearing and external ear normal.  Eyes:     General: Lids are normal.        Right eye: No discharge.        Left eye: No discharge.     Conjunctiva/sclera: Conjunctivae normal.     Pupils: Pupils are equal, round, and reactive to light.  Neck:     Thyroid : No thyromegaly.     Vascular: No carotid bruit.  Cardiovascular:     Rate and Rhythm: Normal rate and regular rhythm.     Heart sounds: Normal heart sounds. No murmur heard.    No gallop.  Pulmonary:     Effort: Pulmonary effort is normal. No accessory muscle usage or respiratory distress.     Breath sounds: Normal breath sounds. No decreased breath sounds, wheezing or rales.  Abdominal:     General: Bowel sounds are normal. There is no distension.     Palpations: Abdomen is soft.     Tenderness: There is no abdominal tenderness.  Musculoskeletal:     Right hand: Normal.     Left hand: Normal.     Cervical back: Normal range of motion and neck supple.     Thoracic back: Normal.     Lumbar back: Normal.     Right knee: Normal.     Left knee: Normal.     Right lower leg: No edema.     Left lower leg: No edema.  Lymphadenopathy:     Cervical: No cervical adenopathy.  Skin:    General: Skin is warm and dry.  Neurological:     Mental Status: She is alert and oriented to person, place, and time.     Deep Tendon Reflexes: Reflexes are normal and symmetric.     Reflex Scores:      Brachioradialis reflexes are 2+ on the  right side and 2+ on the left side.      Patellar reflexes  are 2+ on the right side and 2+ on the left side. Psychiatric:        Attention and Perception: Attention normal.        Mood and Affect: Mood normal.        Speech: Speech normal.        Behavior: Behavior normal. Behavior is cooperative.        Thought Content: Thought content normal.    Results for orders placed or performed in visit on 11/09/23  Microalbumin, Urine Waived   Collection Time: 11/09/23  4:28 PM  Result Value Ref Range   Microalb, Ur Waived 30 (H) 0 - 19 mg/L   Creatinine, Urine Waived 200 10 - 300 mg/dL   Microalb/Creat Ratio <30 <30 mg/g  CBC with Differential/Platelet   Collection Time: 11/09/23  4:34 PM  Result Value Ref Range   WBC 6.1 3.4 - 10.8 x10E3/uL   RBC 4.25 3.77 - 5.28 x10E6/uL   Hemoglobin 12.9 11.1 - 15.9 g/dL   Hematocrit 60.5 65.9 - 46.6 %   MCV 93 79 - 97 fL   MCH 30.4 26.6 - 33.0 pg   MCHC 32.7 31.5 - 35.7 g/dL   RDW 85.9 88.2 - 84.5 %   Platelets 248 150 - 450 x10E3/uL   Neutrophils 74 Not Estab. %   Lymphs 14 Not Estab. %   Monocytes 7 Not Estab. %   Eos 4 Not Estab. %   Basos 1 Not Estab. %   Neutrophils Absolute 4.6 1.4 - 7.0 x10E3/uL   Lymphocytes Absolute 0.8 0.7 - 3.1 x10E3/uL   Monocytes Absolute 0.4 0.1 - 0.9 x10E3/uL   EOS (ABSOLUTE) 0.3 0.0 - 0.4 x10E3/uL   Basophils Absolute 0.0 0.0 - 0.2 x10E3/uL   Immature Granulocytes 0 Not Estab. %   Immature Grans (Abs) 0.0 0.0 - 0.1 x10E3/uL  Comprehensive metabolic panel   Collection Time: 11/09/23  4:34 PM  Result Value Ref Range   Glucose 90 70 - 99 mg/dL   BUN 15 8 - 27 mg/dL   Creatinine, Ser 8.94 (H) 0.57 - 1.00 mg/dL   eGFR 60 >40 fO/fpw/8.26   BUN/Creatinine Ratio 14 12 - 28   Sodium 142 134 - 144 mmol/L   Potassium 4.0 3.5 - 5.2 mmol/L   Chloride 104 96 - 106 mmol/L   CO2 24 20 - 29 mmol/L   Calcium  8.9 8.7 - 10.3 mg/dL   Total Protein 6.2 6.0 - 8.5 g/dL   Albumin 4.2 3.9 - 4.9 g/dL   Globulin, Total 2.0  1.5 - 4.5 g/dL   Bilirubin Total 0.3 0.0 - 1.2 mg/dL   Alkaline Phosphatase 53 44 - 121 IU/L   AST 24 0 - 40 IU/L   ALT 21 0 - 32 IU/L  Lipid Panel w/o Chol/HDL Ratio   Collection Time: 11/09/23  4:34 PM  Result Value Ref Range   Cholesterol, Total 149 100 - 199 mg/dL   Triglycerides 871 0 - 149 mg/dL   HDL 64 >60 mg/dL   VLDL Cholesterol Cal 22 5 - 40 mg/dL   LDL Chol Calc (NIH) 63 0 - 99 mg/dL  VITAMIN D  25 Hydroxy (Vit-D Deficiency, Fractures)   Collection Time: 11/09/23  4:34 PM  Result Value Ref Range   Vit D, 25-Hydroxy 74.7 30.0 - 100.0 ng/mL  Vitamin B12   Collection Time: 11/09/23  4:34 PM  Result Value Ref Range   Vitamin B-12 344 232 - 1,245 pg/mL  TSH   Collection Time:  11/09/23  4:34 PM  Result Value Ref Range   TSH 0.615 0.450 - 4.500 uIU/mL  Magnesium    Collection Time: 11/09/23  4:34 PM  Result Value Ref Range   Magnesium  2.0 1.6 - 2.3 mg/dL  HgB J8r   Collection Time: 11/09/23  4:34 PM  Result Value Ref Range   Hgb A1c MFr Bld 6.0 (H) 4.8 - 5.6 %   Est. average glucose Bld gHb Est-mCnc 126 mg/dL      Assessment & Plan:   Problem List Items Addressed This Visit       Other   Depression - Primary   Chronic, improved with increase in Wellbutrin .  Denies SI/HI.  At this time will continue Wellbutrin  XL 300 MG daily and maintain Prozac  + Trazodone  as needed for sleep.  Could consider in future discontinuation of Prozac  and change to Duloxetine which may also benefit her chronic pain, along with mood.       Multiple joint pain   Ongoing for long while -- more to back, knees, and hands.  Knees are the worst.  ANA negative in past.  Is on feet long periods at work.  Discussed options.  She wishes to hold off on imaging or ortho referral at present.  Recommend she try OTC Voltaren gel, has not used this yet.  Educated her on this.  Tylenol  as needed.  When on feet for long periods recommend she use knee sleeves for support.  If worsening or ongoing then consider  imaging or referral to ortho.      Other Visit Diagnoses       Yeast vaginitis       Diflucan  sent in for patient.   Relevant Medications   fluconazole  (DIFLUCAN ) 150 MG tablet        Follow up plan: Return in about 4 months (around 05/17/2024) for COPD, A1c check, HLD, Thyroid .

## 2024-01-04 NOTE — Assessment & Plan Note (Signed)
 Chronic, improved with increase in Wellbutrin .  Denies SI/HI.  At this time will continue Wellbutrin  XL 300 MG daily and maintain Prozac  + Trazodone  as needed for sleep.  Could consider in future discontinuation of Prozac  and change to Duloxetine which may also benefit her chronic pain, along with mood.

## 2024-01-05 ENCOUNTER — Encounter: Payer: Self-pay | Admitting: Podiatry

## 2024-01-05 ENCOUNTER — Ambulatory Visit (INDEPENDENT_AMBULATORY_CARE_PROVIDER_SITE_OTHER): Payer: BC Managed Care – PPO | Admitting: Podiatry

## 2024-01-05 VITALS — Ht 67.0 in | Wt 212.0 lb

## 2024-01-05 DIAGNOSIS — M7671 Peroneal tendinitis, right leg: Secondary | ICD-10-CM

## 2024-01-05 DIAGNOSIS — M7661 Achilles tendinitis, right leg: Secondary | ICD-10-CM | POA: Diagnosis not present

## 2024-01-05 NOTE — Progress Notes (Signed)
 Subjective:  Patient ID: Audrey Richard, female    DOB: 29-Nov-1961,  MRN: [id number]  Chief Complaint  Patient presents with   Foot Pain    Pt is here to f/u on right foot pain pt states her foot is better, and stop wearing the brace.    63 y.o. female presents with the above complaint.  Patient presents for follow-up of peroneal tendinitis and Achilles tendinitis she states she is doing a lot better from both standpoint no restriction she would like to return to work   Review of Systems: Negative except as noted in the HPI. Denies N/V/F/Ch.  Past Medical History:  Diagnosis Date   Asthma    COPD (chronic obstructive pulmonary disease) (HCC)    Depression    High cholesterol    Osteoporosis    Throat ulcer    Thyroid  disease     Current Outpatient Medications:    albuterol  (PROVENTIL ) (2.5 MG/3ML) 0.083% nebulizer solution, Inhale 3 mLs (2.5 mg total) into the lungs every 2 (two) hours as needed for shortness of breath or wheezing., Disp: 75 mL, Rfl: 5   albuterol  (VENTOLIN  HFA) 108 (90 Base) MCG/ACT inhaler, Inhale 2 puffs into the lungs every 6 (six) hours as needed for wheezing or shortness of breath., Disp: 18 g, Rfl: 4   Budeson-Glycopyrrol-Formoterol  (BREZTRI  AEROSPHERE) 160-9-4.8 MCG/ACT AERO, Inhale 2 puffs into the lungs 2 (two) times daily., Disp: 10.7 g, Rfl: 11   buPROPion  (WELLBUTRIN  XL) 300 MG 24 hr tablet, Take 1 tablet (300 mg total) by mouth daily., Disp: 90 tablet, Rfl: 4   cetirizine  (ZYRTEC ) 10 MG tablet, Take 1 tablet (10 mg total) by mouth daily., Disp: 90 tablet, Rfl: 4   Cholecalciferol  (VITAMIN D3) 1.25 MG (50000 UT) CAPS, Take 1 capsule (1.25 mg total) by mouth once a week., Disp: 12 capsule, Rfl: 4   cyclobenzaprine  (FLEXERIL ) 10 MG tablet, TAKE 1 TABLET BY MOUTH AT BEDTIME, Disp: 90 tablet, Rfl: 4   famotidine  (PEPCID ) 20 MG tablet, One after supper, Disp: 30 tablet, Rfl: 11   FLUoxetine  (PROZAC ) 20 MG tablet, Take 1 tablet (20 mg total) by mouth  daily., Disp: 90 tablet, Rfl: 4   fluticasone  (FLONASE ) 50 MCG/ACT nasal spray, Use 2 spray(s) in each nostril once daily, Disp: 16 g, Rfl: 4   gabapentin  (NEURONTIN ) 300 MG capsule, Take one capsule (300 MG) by mouth in the morning and at noon, then before bedtime take 2 capsules (600 MG) by mouth., Disp: 360 capsule, Rfl: 4   levothyroxine  (SYNTHROID ) 125 MCG tablet, Take 1 tablet (125 mcg total) by mouth daily., Disp: 90 tablet, Rfl: 4   meloxicam  (MOBIC ) 15 MG tablet, Take 1 tablet (15 mg total) by mouth daily., Disp: 30 tablet, Rfl: 0   montelukast  (SINGULAIR ) 10 MG tablet, Take 1 tablet (10 mg total) by mouth at bedtime., Disp: 90 tablet, Rfl: 4   omeprazole  (PRILOSEC) 40 MG capsule, TAKE ONE CAPSULE BY MOUTH EVERY EVENING, Disp: , Rfl:    rosuvastatin  (CRESTOR ) 20 MG tablet, TAKE ONE TABLET BY MOUTH EVERY DAY, Disp: 90 tablet, Rfl: 4   traZODone  (DESYREL ) 50 MG tablet, TAKE 1 TABLET BY MOUTH AT BEDTIME AS NEEDED, Disp: 30 tablet, Rfl: 2   vitamin B-12 (CYANOCOBALAMIN ) 500 MCG tablet, Take 1 tablet (500 mcg total) by mouth daily., Disp: 90 tablet, Rfl: 4  Social History   Tobacco Use  Smoking Status Former   Current packs/day: 0.00   Types: Cigarettes   Quit date: 11/15/2005  Years since quitting: 18.1  Smokeless Tobacco Never    Allergies  Allergen Reactions   Biaxin [Clarithromycin]     Patient reports itching, nausea   Iodine     Iodine blisters skin   Objective:  There were no vitals filed for this visit. Body mass index is 33.2 kg/m. Constitutional Well developed. Well nourished.  Vascular Dorsalis pedis pulses palpable bilaterally. Posterior tibial pulses palpable bilaterally. Capillary refill normal to all digits.  No cyanosis or clubbing noted. Pedal hair growth normal.  Neurologic Normal speech. Oriented to person, place, and time. Epicritic sensation to light touch grossly present bilaterally.  Dermatologic Nails well groomed and normal in appearance. No  open wounds. No skin lesions.  Orthopedic: No further pain on palpation along the course of the peroneal tendon no pain at the insertion mild pain with dorsiflexion eversion of the foot resisted no pain with plantarflexion inversion of the foot.  No pain at the ATFL ligament Achilles tendon posterior tibial tendon  No further pain on palpation of right Achilles tendon insertion.  No further pain with range of motion of the ankle joint pain with dorsiflexion of the ankle joint no pain with plantarflexion of the ankle joint.  No deep intra-articular ankle pain noted.   Radiographs: None Assessment:   1. Peroneal tendinitis, right   2. Achilles tendinitis, right leg      Plan:  Patient was evaluated and treated and all questions answered.  Right Achilles tendinitis -Clinically healed and officially discharged from my care I discussed prevention technique including shoe gear modification if any foot and ankle issues are in the future she will come back and see me.   Right peroneal tendinitis -Clinically healed.  At this time I encouraged her to come out of the cam boot and transition to Tri-Lock ankle brace and regular shoes.  Tri-Lock ankle brace was dispensed. No follow-ups on file.

## 2024-01-12 ENCOUNTER — Other Ambulatory Visit: Payer: Self-pay | Admitting: Nurse Practitioner

## 2024-01-12 DIAGNOSIS — F324 Major depressive disorder, single episode, in partial remission: Secondary | ICD-10-CM

## 2024-01-12 NOTE — Telephone Encounter (Signed)
No longer current dosing of this medication Requested Prescriptions  Pending Prescriptions Disp Refills   buPROPion (WELLBUTRIN XL) 150 MG 24 hr tablet [Pharmacy Med Name: buPROPion HCl ER (XL) 150 MG Oral Tablet Extended Release 24 Hour] 90 tablet 0    Sig: Take 1 tablet by mouth once daily     Psychiatry: Antidepressants - bupropion Failed - 01/12/2024 12:43 PM      Failed - Cr in normal range and within 360 days    Creatinine  Date Value Ref Range Status  02/14/2014 1.15 0.60 - 1.30 mg/dL Final   Creatinine, Ser  Date Value Ref Range Status  11/09/2023 1.05 (H) 0.57 - 1.00 mg/dL Final         Passed - AST in normal range and within 360 days    AST  Date Value Ref Range Status  11/09/2023 24 0 - 40 IU/L Final   SGOT(AST)  Date Value Ref Range Status  02/14/2014 19 15 - 37 Unit/L Final         Passed - ALT in normal range and within 360 days    ALT  Date Value Ref Range Status  11/09/2023 21 0 - 32 IU/L Final   SGPT (ALT)  Date Value Ref Range Status  02/14/2014 29 12 - 78 U/L Final         Passed - Completed PHQ-2 or PHQ-9 in the last 360 days      Passed - Last BP in normal range    BP Readings from Last 1 Encounters:  01/04/24 125/83         Passed - Valid encounter within last 6 months    Recent Outpatient Visits           1 week ago Major depressive disorder with single episode, in partial remission (HCC)   [place] [facility] [place], Corrie Dandy T, NP   2 months ago Asthmatic bronchitis , chronic (HCC)   [place] [facility] [place], Corrie Dandy T, NP   1 year ago Flu vaccine need   [place] [facility] [place], Corrie Dandy T, NP   1 year ago COPD exacerbation (HCC)   [place] [place], Corrie Dandy T, NP   1 year ago Asthmatic bronchitis , chronic (HCC)    [facility] [place], Dorie Rank, NP       Future Appointments             In 4 months Cannady, Dorie Rank, NP [facility], [facility]

## 2024-01-29 ENCOUNTER — Other Ambulatory Visit: Payer: Self-pay | Admitting: Nurse Practitioner

## 2024-01-29 NOTE — Telephone Encounter (Signed)
 Requested medication (s) are due for refill today - provider review   Requested medication (s) are on the active medication list -yes  Future visit scheduled -yes  Last refill: 01/05/23 #12 4RF  Notes to clinic: high dose medication- requires provider review   Requested Prescriptions  Pending Prescriptions Disp Refills   Cholecalciferol  (VITAMIN D3) 1.25 MG (50000 UT) CAPS [Pharmacy Med Name: Vitamin D3 1.25 MG (50000 UT) Oral Capsule] 12 capsule 0    Sig: Take 1 capsule by mouth once a week     Endocrinology:  Vitamins - Vitamin D  Supplementation 2 Failed - 01/29/2024  3:36 PM      Failed - Manual Review: Route requests for 50,000 IU strength to the provider      Passed - Ca in normal range and within 360 days    Calcium   Date Value Ref Range Status  11/09/2023 8.9 8.7 - 10.3 mg/dL Final   Calcium , Total  Date Value Ref Range Status  02/14/2014 8.7 8.5 - 10.1 mg/dL Final         Passed - Vitamin D  in normal range and within 360 days    Vit D, 25-Hydroxy  Date Value Ref Range Status  11/09/2023 74.7 30.0 - 100.0 ng/mL Final    Comment:    Vitamin D  deficiency has been defined by the Institute of Medicine and an Endocrine Society practice guideline as a level of serum 25-OH vitamin D  less than 20 ng/mL (1,2). The Endocrine Society went on to further define vitamin D  insufficiency as a level between 21 and 29 ng/mL (2). 1. IOM (Institute of Medicine). 2010. Dietary reference    intakes for calcium  and D. [place]: The    [employer]. 2. Holick MF, Binkley [place], Bischoff-Ferrari HA, et al.    Evaluation, treatment, and prevention of vitamin D     deficiency: an Endocrine Society clinical practice    guideline. JCEM. 2011 Jul; 96(7):1911-30.          Passed - Valid encounter within last 12 months    Recent Outpatient Visits           3 weeks ago Major depressive disorder with single episode, in partial remission (HCC)   [place] [facility] [place], Melanie T, NP   2 months ago Asthmatic bronchitis , chronic (HCC)   [place] [facility] [place], Melanie T, NP   1 year ago Flu vaccine need   [place] [facility] [place], Melanie T, NP   1 year ago COPD exacerbation (HCC)   [place] [facility] [place], Melanie T, NP   1 year ago Asthmatic bronchitis , chronic (HCC)   [facility] [place], Melanie DASEN, NP       Future Appointments             In 3 months Cannady, Jolene T, NP [place] [facility], [facility]               Requested Prescriptions  Pending Prescriptions Disp Refills   Cholecalciferol  (VITAMIN D3) 1.25 MG (50000 UT) CAPS [Pharmacy Med Name: Vitamin D3 1.25 MG (50000 UT) Oral Capsule] 12 capsule 0    Sig: Take 1 capsule by mouth once a week     Endocrinology:  Vitamins - Vitamin D  Supplementation 2 Failed - 01/29/2024  3:36 PM      Failed - Manual Review: Route requests for 50,000 IU strength to the provider  Passed - Ca in normal range and within 360 days    Calcium   Date Value Ref Range Status  11/09/2023 8.9 8.7 - 10.3 mg/dL Final   Calcium , Total  Date Value Ref Range Status  02/14/2014 8.7 8.5 - 10.1 mg/dL Final         Passed - Vitamin D  in normal range and within 360 days    Vit D, 25-Hydroxy  Date Value Ref Range Status  11/09/2023 74.7 30.0 - 100.0 ng/mL Final    Comment:    Vitamin D  deficiency has been defined by the Institute of Medicine and an Endocrine Society practice guideline as a level of serum 25-OH vitamin D  less than 20 ng/mL (1,2). The Endocrine Society went on to further define vitamin D  insufficiency as a level between 21 and 29 ng/mL (2). 1. IOM (Institute of Medicine). 2010. Dietary reference    intakes for calcium  and D. [place]: The    [employer]. 2. Holick MF, Binkley [place], Bischoff-Ferrari HA, et al.    Evaluation, treatment, and prevention of vitamin  D    deficiency: an Endocrine Society clinical practice    guideline. JCEM. 2011 Jul; 96(7):1911-30.          Passed - Valid encounter within last 12 months    Recent Outpatient Visits           3 weeks ago Major depressive disorder with single episode, in partial remission (HCC)   [place] [facility] [place], Melanie T, NP   2 months ago Asthmatic bronchitis , chronic (HCC)   [place] [facility] [place], Melanie T, NP   1 year ago Flu vaccine need   [place] [facility] [place], Melanie T, NP   1 year ago COPD exacerbation [facility])   [place] [facility] [place], Melanie T, NP   1 year ago Asthmatic bronchitis , chronic (HCC)   [place] [facility] [place], Melanie DASEN, NP       Future Appointments             In 3 months Cannady, Jolene T, NP [facility], [facility]

## 2024-02-04 ENCOUNTER — Other Ambulatory Visit: Payer: Self-pay | Admitting: Nurse Practitioner

## 2024-02-04 DIAGNOSIS — R058 Other specified cough: Secondary | ICD-10-CM

## 2024-02-04 NOTE — Telephone Encounter (Signed)
Requested Prescriptions  Pending Prescriptions Disp Refills   cetirizine (EQ ALLERGY RELIEF, CETIRIZINE,) 10 MG tablet [Pharmacy Med Name: EQ Allergy Relief (Cetirizine) 10 MG Oral Tablet] 90 tablet 1    Sig: Take 1 tablet by mouth once daily     Ear, Nose, and Throat:  Antihistamines 2 Failed - 02/04/2024  3:32 PM      Failed - Cr in normal range and within 360 days    Creatinine  Date Value Ref Range Status  02/14/2014 1.15 0.60 - 1.30 mg/dL Final   Creatinine, Ser  Date Value Ref Range Status  11/09/2023 1.05 (H) 0.57 - 1.00 mg/dL Final         Passed - Valid encounter within last 12 months    Recent Outpatient Visits           1 month ago Major depressive disorder with single episode, in partial remission (HCC)   [place] [facility] [place], Corrie Dandy T, NP   2 months ago Asthmatic bronchitis , chronic (HCC)   [place] [facility] [place], Corrie Dandy T, NP   1 year ago Flu vaccine need   [place] [facility] [place], Corrie Dandy T, NP   1 year ago COPD exacerbation (HCC)   [place] [facility] [place], Corrie Dandy T, NP   1 year ago Asthmatic bronchitis , chronic (HCC)   [place] [facility] [place], Dorie Rank, NP       Future Appointments             In 3 months Cannady, Dorie Rank, NP [place] [facility], [facility]

## 2024-03-09 ENCOUNTER — Other Ambulatory Visit: Payer: Self-pay | Admitting: Nurse Practitioner

## 2024-03-10 NOTE — Telephone Encounter (Signed)
 Requested Prescriptions  Pending Prescriptions Disp Refills   montelukast (SINGULAIR) 10 MG tablet [Pharmacy Med Name: Montelukast Sodium 10 MG Oral Tablet] 90 tablet 0    Sig: TAKE 1 TABLET BY MOUTH AT BEDTIME     Pulmonology:  Leukotriene Inhibitors Passed - 03/10/2024 10:50 AM      Passed - Valid encounter within last 12 months    Recent Outpatient Visits           2 months ago Major depressive disorder with single episode, in partial remission (HCC)   [place] [facility] [place], Bertrand T, NP   4 months ago Asthmatic bronchitis , chronic (HCC)   [place] [facility] [place], Corrie Dandy T, NP   1 year ago Flu vaccine need   [place] [facility] [place], Corrie Dandy T, NP   1 year ago COPD exacerbation (HCC)   [place] [facility] [place], Corrie Dandy T, NP   1 year ago Asthmatic bronchitis , chronic (HCC)   [place] [facility] [place], Dorie Rank, NP       Future Appointments             In 2 months Cannady, Dorie Rank, NP [place] [facility]             famotidine (PEPCID) 20 MG tablet [Pharmacy Med Name: Famotidine 20 MG Oral Tablet] 90 tablet 0    Sig: TAKE 1 TABLET BY MOUTH ONCE DAILY AFTER  SUPPER     Gastroenterology:  H2 Antagonists Passed - 03/10/2024 10:50 AM      Passed - Valid encounter within last 12 months    Recent Outpatient Visits           2 months ago Major depressive disorder with single episode, in partial remission (HCC)   [place] [facility] [place], [place] T, NP   4 months ago Asthmatic bronchitis , chronic (HCC)   [place] [facility] [place], Corrie Dandy T, NP   1 year ago Flu vaccine need   [place] [facility] [place], Corrie Dandy T, NP   1 year ago COPD exacerbation (HCC)   [place] [facility] [place], Corrie Dandy T, NP   1 year ago Asthmatic bronchitis , chronic (HCC)    [facility] [place], Dorie Rank, NP       Future Appointments             In 2 months Cannady, Dorie Rank, NP  [facility], [facility]

## 2024-03-29 ENCOUNTER — Other Ambulatory Visit: Payer: Self-pay | Admitting: Nurse Practitioner

## 2024-03-29 DIAGNOSIS — F324 Major depressive disorder, single episode, in partial remission: Secondary | ICD-10-CM

## 2024-03-29 DIAGNOSIS — E782 Mixed hyperlipidemia: Secondary | ICD-10-CM

## 2024-03-30 NOTE — Telephone Encounter (Signed)
 Appt-05/18/24 Requested Prescriptions  Pending Prescriptions Disp Refills   FLUoxetine (PROZAC) 20 MG tablet [Pharmacy Med Name: FLUoxetine HCl 20 MG Oral Tablet] 90 tablet 0    Sig: Take 1 tablet by mouth once daily     Psychiatry:  Antidepressants - SSRI Failed - 03/30/2024  9:43 AM      Failed - Valid encounter within last 6 months    Recent Outpatient Visits   None     Future Appointments             In 1 month Cannady, Dorie Rank, NP [place] [facility]            Passed - Completed PHQ-2 or PHQ-9 in the last 360 days       rosuvastatin (CRESTOR) 20 MG tablet [Pharmacy Med Name: Rosuvastatin Calcium 20 MG Oral Tablet] 90 tablet 0    Sig: Take 1 tablet by mouth once daily     Cardiovascular:  Antilipid - Statins 2 Failed - 03/30/2024  9:43 AM      Failed - Cr in normal range and within 360 days    Creatinine  Date Value Ref Range Status  02/14/2014 1.15 0.60 - 1.30 mg/dL Final   Creatinine, Ser  Date Value Ref Range Status  11/09/2023 1.05 (H) 0.57 - 1.00 mg/dL Final         Failed - Valid encounter within last 12 months    Recent Outpatient Visits   None     Future Appointments             In 1 month Cannady, Jolene T, NP [place] [facility]            Failed - Lipid Panel in normal range within the last 12 months    Cholesterol, Total  Date Value Ref Range Status  11/09/2023 149 100 - 199 mg/dL Final   LDL Chol Calc (NIH)  Date Value Ref Range Status  11/09/2023 63 0 - 99 mg/dL Final   HDL  Date Value Ref Range Status  11/09/2023 64 >39 mg/dL Final   Triglycerides  Date Value Ref Range Status  11/09/2023 128 0 - 149 mg/dL Final         Passed - Patient is not pregnant       levothyroxine (SYNTHROID) 125 MCG tablet [Pharmacy Med Name: Levothyroxine Sodium 125 MCG Oral Tablet] 90 tablet 0    Sig: Take 1 tablet by mouth once daily     Endocrinology:  Hypothyroid Agents Failed - 03/30/2024  9:43 AM       Failed - Valid encounter within last 12 months    Recent Outpatient Visits   None     Future Appointments             In 1 month Cannady, Dorie Rank, NP [place] [facility]            Passed - TSH in normal range and within 360 days    TSH  Date Value Ref Range Status  11/09/2023 0.615 0.450 - 4.500 uIU/mL Final

## 2024-04-04 ENCOUNTER — Other Ambulatory Visit: Payer: Self-pay | Admitting: Nurse Practitioner

## 2024-04-04 DIAGNOSIS — F324 Major depressive disorder, single episode, in partial remission: Secondary | ICD-10-CM

## 2024-04-05 NOTE — Telephone Encounter (Signed)
 Too soon for refill.  Requested Prescriptions  Pending Prescriptions Disp Refills   FLUoxetine (PROZAC) 20 MG tablet [Pharmacy Med Name: FLUoxetine HCl 20 MG Oral Tablet] 90 tablet 0    Sig: Take 1 tablet by mouth once daily     Psychiatry:  Antidepressants - SSRI Failed - 04/05/2024 10:56 AM      Failed - Valid encounter within last 6 months    Recent Outpatient Visits   None     Future Appointments             In 1 month Cannady, Jolene T, NP [place] [facility]            Passed - Completed PHQ-2 or PHQ-9 in the last 360 days       famotidine (PEPCID) 20 MG tablet [Pharmacy Med Name: Famotidine 20 MG Oral Tablet] 90 tablet 0    Sig: TAKE 1 TABLET BY MOUTH ONCE DAILY AFTER SUPPER     Gastroenterology:  H2 Antagonists Failed - 04/05/2024 10:56 AM      Failed - Valid encounter within last 12 months    Recent Outpatient Visits   None     Future Appointments             In 1 month Cannady, Lavelle Posey, NP [facility] [place], [facility]

## 2024-05-02 ENCOUNTER — Telehealth: Admitting: Nurse Practitioner

## 2024-05-02 DIAGNOSIS — J4541 Moderate persistent asthma with (acute) exacerbation: Secondary | ICD-10-CM

## 2024-05-02 MED ORDER — BENZONATATE 100 MG PO CAPS: 100.0000 mg | ORAL_CAPSULE | Freq: Three times a day (TID) | ORAL | 0 refills | Status: DC | PRN

## 2024-05-02 MED ORDER — PREDNISONE 20 MG PO TABS
20.0000 mg | ORAL_TABLET | Freq: Two times a day (BID) | ORAL | 0 refills | Status: AC
Start: 2024-05-02 — End: 2024-05-07

## 2024-05-02 MED ORDER — DOXYCYCLINE HYCLATE 100 MG PO TABS
100.0000 mg | ORAL_TABLET | Freq: Two times a day (BID) | ORAL | 0 refills | Status: AC
Start: 2024-05-02 — End: 2024-05-09

## 2024-05-02 NOTE — Progress Notes (Signed)
 Virtual Visit Consent   Audrey Richard, you are scheduled for a virtual visit with a [facility] provider today. Just as with appointments in the office, your consent must be obtained to participate. Your consent will be active for this visit and any virtual visit you may have with one of our providers in the next 365 days. If you have a MyChart account, a copy of this consent can be sent to you electronically.  As this is a virtual visit, video technology does not allow for your provider to perform a traditional examination. This may limit your provider's ability to fully assess your condition. If your provider identifies any concerns that need to be evaluated in person or the need to arrange testing (such as labs, EKG, etc.), we will make arrangements to do so. Although advances in technology are sophisticated, we cannot ensure that it will always work on either your end or our end. If the connection with a video visit is poor, the visit may have to be switched to a telephone visit. With either a video or telephone visit, we are not always able to ensure that we have a secure connection.  By engaging in this virtual visit, you consent to the provision of healthcare and authorize for your insurance to be billed (if applicable) for the services provided during this visit. Depending on your insurance coverage, you may receive a charge related to this service.  I need to obtain your verbal consent now. Are you willing to proceed with your visit today? JACLIN DENENBERG has provided verbal consent on 05/02/2024 for a virtual visit (video or telephone). Mardene Shake, FNP  Date: 05/02/2024 3:04 PM   Virtual Visit via Video Note   I, Mardene Shake, connected with  FARYN CHRZANOWSKI  ([id number], 1961-02-17) on 05/02/24 at  3:15 PM EDT by a video-enabled telemedicine application and verified that I am speaking with the correct person using two identifiers.  Location: Patient: Virtual Visit Location Patient:  Home Provider: Virtual Visit Location Provider: Home Office   I discussed the limitations of evaluation and management by telemedicine and the availability of in person appointments. The patient expressed understanding and agreed to proceed.    History of Present Illness: Audrey Richard is a 63 y.o. who identifies as a female who was assigned female at birth, and is being seen today for a cough   The cough started 3 day ago and is unsure if a fever was associated with it. The cough causes her chest to burn, she has a lot of mucous from her sinuses as well.  Her cough is productive   History is consistent for asthma and asthmatic bronchitis that is chronic  Also had an associated sore throat that has resolved   She has bene using her ventolin  three times a day and has continues her Breztri  daily as directed   She feels similar to when she gets bronchitis   Problems:  Patient Active Problem List   Diagnosis Date Noted   Multiple joint pain 01/04/2024   Chronic kidney disease, stage 3a (HCC) 12/04/2022   Obesity 12/11/2021   Upper airway cough syndrome 08/06/2021   Aortic atherosclerosis (HCC) 07/13/2021   B12 deficiency 06/18/2021   Vitamin D  deficiency 06/15/2021   Elevated hemoglobin A1c 05/14/2021   Chronic back pain 01/10/2020   Acid reflux 09/29/2019   Primary generalized (osteo)arthritis 09/29/2019   Health care maintenance 09/29/2019   Asthmatic bronchitis , chronic (HCC) 11/15/2018   Allergic rhinitis  12/08/2017   Osteopenia 06/11/2015   Depression 06/11/2015   Insomnia 06/11/2015   Hashimoto's thyroiditis 06/11/2015   Mixed hyperlipidemia 06/11/2015    Allergies:  Allergies  Allergen Reactions   Biaxin [Clarithromycin]     Patient reports itching, nausea   Iodine     Iodine blisters skin   Medications:  Current Outpatient Medications:    albuterol  (PROVENTIL ) (2.5 MG/3ML) 0.083% nebulizer solution, Inhale 3 mLs (2.5 mg total) into the lungs every 2 (two)  hours as needed for shortness of breath or wheezing., Disp: 75 mL, Rfl: 5   albuterol  (VENTOLIN  HFA) 108 (90 Base) MCG/ACT inhaler, Inhale 2 puffs into the lungs every 6 (six) hours as needed for wheezing or shortness of breath., Disp: 18 g, Rfl: 4   Budeson-Glycopyrrol-Formoterol  (BREZTRI  AEROSPHERE) 160-9-4.8 MCG/ACT AERO, Inhale 2 puffs into the lungs 2 (two) times daily., Disp: 10.7 g, Rfl: 11   buPROPion  (WELLBUTRIN  XL) 300 MG 24 hr tablet, Take 1 tablet (300 mg total) by mouth daily., Disp: 90 tablet, Rfl: 4   cetirizine  (EQ ALLERGY RELIEF, CETIRIZINE ,) 10 MG tablet, Take 1 tablet by mouth once daily, Disp: 90 tablet, Rfl: 1   Cholecalciferol  (VITAMIN D3) 1.25 MG (50000 UT) CAPS, Take 1 capsule by mouth once a week, Disp: 12 capsule, Rfl: 4   cyclobenzaprine  (FLEXERIL ) 10 MG tablet, TAKE 1 TABLET BY MOUTH AT BEDTIME, Disp: 90 tablet, Rfl: 4   famotidine  (PEPCID ) 20 MG tablet, TAKE 1 TABLET BY MOUTH ONCE DAILY AFTER  SUPPER, Disp: 90 tablet, Rfl: 0   FLUoxetine  (PROZAC ) 20 MG tablet, Take 1 tablet by mouth once daily, Disp: 90 tablet, Rfl: 0   fluticasone  (FLONASE ) 50 MCG/ACT nasal spray, Use 2 spray(s) in each nostril once daily, Disp: 16 g, Rfl: 4   gabapentin  (NEURONTIN ) 300 MG capsule, Take one capsule (300 MG) by mouth in the morning and at noon, then before bedtime take 2 capsules (600 MG) by mouth., Disp: 360 capsule, Rfl: 4   levothyroxine  (SYNTHROID ) 125 MCG tablet, Take 1 tablet by mouth once daily, Disp: 90 tablet, Rfl: 0   meloxicam  (MOBIC ) 15 MG tablet, Take 1 tablet (15 mg total) by mouth daily., Disp: 30 tablet, Rfl: 0   montelukast  (SINGULAIR ) 10 MG tablet, TAKE 1 TABLET BY MOUTH AT BEDTIME, Disp: 90 tablet, Rfl: 0   omeprazole  (PRILOSEC) 40 MG capsule, TAKE ONE CAPSULE BY MOUTH EVERY EVENING, Disp: , Rfl:    rosuvastatin  (CRESTOR ) 20 MG tablet, Take 1 tablet by mouth once daily, Disp: 90 tablet, Rfl: 0   traZODone  (DESYREL ) 50 MG tablet, TAKE 1 TABLET BY MOUTH AT BEDTIME AS  NEEDED, Disp: 30 tablet, Rfl: 2   vitamin B-12 (CYANOCOBALAMIN ) 500 MCG tablet, Take 1 tablet (500 mcg total) by mouth daily., Disp: 90 tablet, Rfl: 4  Observations/Objective: Patient is well-developed, well-nourished in no acute distress.  Resting comfortably  at home.  Head is normocephalic, atraumatic.  No labored breathing.  Speech is clear and coherent with logical content.  Patient is alert and oriented at baseline.    Assessment and Plan:   1. Moderate persistent asthmatic bronchitis with acute exacerbation  Continue inhalers as directed  Continue allergy regimen as directed    Meds ordered this encounter  Medications   predniSONE  (DELTASONE ) 20 MG tablet    Sig: Take 1 tablet (20 mg total) by mouth 2 (two) times daily with a meal for 5 days.    Dispense:  10 tablet    Refill:  0  doxycycline  (VIBRA -TABS) 100 MG tablet    Sig: Take 1 tablet (100 mg total) by mouth 2 (two) times daily for 7 days.    Dispense:  14 tablet    Refill:  0   benzonatate  (TESSALON ) 100 MG capsule    Sig: Take 1 capsule (100 mg total) by mouth 3 (three) times daily as needed.    Dispense:  30 capsule    Refill:  0    Follow Up Instructions: I discussed the assessment and treatment plan with the patient. The patient was provided an opportunity to ask questions and all were answered. The patient agreed with the plan and demonstrated an understanding of the instructions.  A copy of instructions were sent to the patient via MyChart unless otherwise noted below.    The patient was advised to call back or seek an in-person evaluation if the symptoms worsen or if the condition fails to improve as anticipated.    Mardene Shake, FNP

## 2024-05-10 ENCOUNTER — Ambulatory Visit: Payer: Self-pay | Admitting: *Deleted

## 2024-05-10 ENCOUNTER — Encounter: Payer: Self-pay | Admitting: *Deleted

## 2024-05-10 NOTE — Telephone Encounter (Signed)
 Copied from CRM [id number]. Topic: Clinical - Red Word Triage >> May 10, 2024 12:41 PM Oddis Bench wrote: Red Word that prompted transfer to Nurse Triage: Patient is calling bc she has bronchitis she is also stating that she has bronchitis and asthma, she was prescribed doxdcyclin 100 mg and prednisone  was prescribed by virtual  doctor for urgent care she is out of the meds and she says she also needs the med to surpress coughing.

## 2024-05-10 NOTE — Telephone Encounter (Signed)
 Chief Complaint: worsening cough, SOB with coughing hx COPD Symptoms: coughing spells with SOB. Chest tightness from coughing. Hoarse voice. Sore throat .  Completed televisit on 05/02/24 and completed antibiotics with no relief.  Frequency: 05/02/24 Pertinent Negatives: Patient denies chest pain with difficulty breathing, no fever reported.  Disposition: [] ED /[] Urgent Care (no appt availability in office) / [] Appointment(In office/virtual)/ []  [facility]/ [] Home Care/ [x] Refused Recommended Disposition /[] [facility]/ []  Follow-up with PCP Additional Notes:    No available OV until June 3 with any provider. Recommended UC or ED. Patient reports she is having difficulty affording $75 up front to go to UC. Requesting if PCP will order tessalon  perles for cough and another antibiotic due to last antibiotic did not work for [employer]. See tele E visit from 05/02/24. Please advise patient requesting wait list. Patient reports she is supposed to go back to work in 2 days and still feels very sick.  NT recommended patient not wait and needs to have in person evaluation. Please advise.      Reason for Disposition  [1] MILD difficulty breathing (e.g., minimal/no SOB at rest, SOB with walking, pulse <100) AND [2] NEW-onset or WORSE than normal  Answer Assessment - Initial Assessment Questions 1. RESPIRATORY STATUS: "Describe your breathing?" (e.g., wheezing, shortness of breath, unable to speak, severe coughing)      Coughing spells , SOB with coughing , sore throat and hoarse  2. ONSET: "When did this breathing problem begin?"      On going since 05/02/24 3. PATTERN "Does the difficult breathing come and go, or has it been constant since it started?"      Comes and goes  4. SEVERITY: "How bad is your breathing?" (e.g., mild, moderate, severe)    - MILD: No SOB at rest, mild SOB with walking, speaks normally in sentences, can lie down, no retractions, pulse < 100.    - MODERATE: SOB  at rest, SOB with minimal exertion and prefers to sit, cannot lie down flat, speaks in phrases, mild retractions, audible wheezing, pulse 100-120.    - SEVERE: Very SOB at rest, speaks in single words, struggling to breathe, sitting hunched forward, retractions, pulse > 120      SOB with coughing , chest tightness  5. RECURRENT SYMPTOM: "Have you had difficulty breathing before?" If Yes, ask: "When was the last time?" and "What happened that time?"      Yes  6. CARDIAC HISTORY: "Do you have any history of heart disease?" (e.g., heart attack, angina, bypass surgery, angioplasty)      See hx  7. LUNG HISTORY: "Do you have any history of lung disease?"  (e.g., pulmonary embolus, asthma, emphysema)     Hx COPD 8. CAUSE: "What do you think is causing the breathing problem?"      bronchitis 9. OTHER SYMPTOMS: "Do you have any other symptoms? (e.g., dizziness, runny nose, cough, chest pain, fever)     Constant cough , hoarse voice, SOB with coughing and chest tightness 10. O2 SATURATION MONITOR:  "Do you use an oxygen saturation monitor (pulse oximeter) at home?" If Yes, ask: "What is your reading (oxygen level) today?" "What is your usual oxygen saturation reading?" (e.g., 95%)       na 11. PREGNANCY: "Is there any chance you are pregnant?" "When was your last menstrual period?"       na 12. TRAVEL: "Have you traveled out of the country in the last month?" (e.g., travel history, exposures)  na  Protocols used: Breathing Difficulty-A-AH

## 2024-05-10 NOTE — Telephone Encounter (Signed)
  This encounter was created in error - please disregard.  Please see triage from today

## 2024-05-10 NOTE — Telephone Encounter (Signed)
 Attempted to call pt back.   Line disconnected as she was transferred to me to triage.  Another nurse has the call.  She has called back in.

## 2024-05-11 NOTE — Telephone Encounter (Signed)
 Called patient she stated that she is on the way to the urgent care.

## 2024-05-15 NOTE — Patient Instructions (Signed)
Please call to schedule your mammogram and/or bone density: [facility] at [facility]  Address: [address], [place], [place] [address] Phone: [phone]  [facility] [address] [place],  [place]  [address] Phone: [phone]   Preventing High Cholesterol Cholesterol is a white, waxy substance similar to fat that the human body needs to help build cells. The liver makes all the cholesterol that a person's body needs. Having high cholesterol (hypercholesterolemia) increases your risk for heart disease and stroke. Extra or excess cholesterol comes from the food that you eat. High cholesterol can often be prevented with diet and lifestyle changes. If you already have high cholesterol, you can control it with diet, lifestyle changes, and medicines. How can high cholesterol affect me? If you have high cholesterol, fatty deposits (plaques) may build up on the walls of your blood vessels. The blood vessels that carry blood away from your heart are called arteries. Plaques make the arteries narrower and stiffer. This in turn can: Restrict or block blood flow and cause blood clots to form. Increase your risk for heart attack and stroke. What can increase my risk for high cholesterol? This condition is more likely to develop in people who: Eat foods that are high in saturated fat or cholesterol. Saturated fat is mostly found in foods that come from animal sources. Are overweight. Are not getting enough exercise. Use products that contain nicotine or tobacco, such as cigarettes, e-cigarettes, and chewing tobacco. Have a family history of high cholesterol (familial hypercholesterolemia). What actions can I take to prevent this? Nutrition  Eat less saturated fat. Avoid trans fats (partially hydrogenated oils). These are often found in margarine and in some baked goods, fried foods, and snacks bought in packages. Avoid precooked  or cured meat, such as bacon, sausages, or meat loaves. Avoid foods and drinks that have added sugars. Eat more fruits, vegetables, and whole grains. Choose healthy sources of protein, such as fish, poultry, lean cuts of red meat, beans, peas, lentils, and nuts. Choose healthy sources of fat, such as: Nuts. Vegetable oils, especially olive oil. Fish that have healthy fats, such as omega-3 fatty acids. These fish include mackerel or salmon. Lifestyle Lose weight if you are overweight. Maintaining a healthy body mass index (BMI) can help prevent or control high cholesterol. It can also lower your risk for diabetes and high blood pressure. Ask your health care provider to help you with a diet and exercise plan to lose weight safely. Do not use any products that contain nicotine or tobacco. These products include cigarettes, chewing tobacco, and vaping devices, such as e-cigarettes. If you need help quitting, ask your health care provider. Alcohol use Do not drink alcohol if: Your health care provider tells you not to drink. You are pregnant, may be pregnant, or are planning to become pregnant. If you drink alcohol: Limit how much you have to: 0-1 drink a day for women. 0-2 drinks a day for men. Know how much alcohol is in your drink. In the [place] one drink equals one 12 oz bottle of beer (355 mL), one 5 oz glass of wine (148 mL), or one 1 oz glass of hard liquor (44 mL). Activity  Get enough exercise. Do exercises as told by your health care provider. Each week, do at least 150 minutes of exercise that takes a medium level of effort (moderate-intensity exercise). This kind of exercise: Makes your heart beat faster while allowing you  to still be able to talk. Can be done in short sessions several times a day or longer sessions a few times a week. For example, on 5 days each week, you could walk fast or ride your bike 3 times a day for 10 minutes each time. Medicines Your health care provider  may recommend medicines to help lower cholesterol. This may be a medicine to lower the amount of cholesterol that your liver makes. You may need medicine if: Diet and lifestyle changes have not lowered your cholesterol enough. You have high cholesterol and other risk factors for heart disease or stroke. Take over-the-counter and prescription medicines only as told by your health care provider. General information Manage your risk factors for high cholesterol. Talk with your health care provider about all your risk factors and how to lower your risk. Manage other conditions that you have, such as diabetes or high blood pressure (hypertension). Have blood tests to check your cholesterol levels at regular points in time as told by your health care provider. Keep all follow-up visits. This is important. Where to find more information American Heart Association: www.heart.org National Heart, Lung, and Blood Institute: [employer].is Summary High cholesterol increases your risk for heart disease and stroke. By keeping your cholesterol level low, you can reduce your risk for these conditions. High cholesterol can often be prevented with diet and lifestyle changes. Work with your health care provider to manage your risk factors, and have your blood tested regularly. This information is not intended to replace advice given to you by your health care provider. Make sure you discuss any questions you have with your health care provider. Document Revised: 07/11/2022 Document Reviewed: 02/11/2021 Elsevier Patient Education  2024 [employer].

## 2024-05-18 ENCOUNTER — Ambulatory Visit: Payer: Self-pay | Admitting: Nurse Practitioner

## 2024-05-18 ENCOUNTER — Encounter: Payer: Self-pay | Admitting: Nurse Practitioner

## 2024-05-18 VITALS — BP 126/80 | HR 93 | Temp 98.4°F | Ht 66.8 in | Wt 206.8 lb

## 2024-05-18 DIAGNOSIS — Z1231 Encounter for screening mammogram for malignant neoplasm of breast: Secondary | ICD-10-CM

## 2024-05-18 DIAGNOSIS — J4489 Other specified chronic obstructive pulmonary disease: Secondary | ICD-10-CM | POA: Diagnosis not present

## 2024-05-18 DIAGNOSIS — Z23 Encounter for immunization: Secondary | ICD-10-CM

## 2024-05-18 DIAGNOSIS — J040 Acute laryngitis: Secondary | ICD-10-CM | POA: Insufficient documentation

## 2024-05-18 DIAGNOSIS — R7309 Other abnormal glucose: Secondary | ICD-10-CM

## 2024-05-18 DIAGNOSIS — E063 Autoimmune thyroiditis: Secondary | ICD-10-CM

## 2024-05-18 DIAGNOSIS — F324 Major depressive disorder, single episode, in partial remission: Secondary | ICD-10-CM

## 2024-05-18 DIAGNOSIS — R058 Other specified cough: Secondary | ICD-10-CM

## 2024-05-18 DIAGNOSIS — Z6832 Body mass index (BMI) 32.0-32.9, adult: Secondary | ICD-10-CM

## 2024-05-18 DIAGNOSIS — E66811 Obesity, class 1: Secondary | ICD-10-CM

## 2024-05-18 DIAGNOSIS — I700 Atherosclerosis of aorta: Secondary | ICD-10-CM

## 2024-05-18 DIAGNOSIS — E782 Mixed hyperlipidemia: Secondary | ICD-10-CM

## 2024-05-18 DIAGNOSIS — N1831 Chronic kidney disease, stage 3a: Secondary | ICD-10-CM | POA: Diagnosis not present

## 2024-05-18 DIAGNOSIS — F5104 Psychophysiologic insomnia: Secondary | ICD-10-CM

## 2024-05-18 DIAGNOSIS — E6609 Other obesity due to excess calories: Secondary | ICD-10-CM

## 2024-05-18 LAB — MICROALBUMIN, URINE WAIVED
Creatinine, Urine Waived: 200 mg/dL (ref 10–300)
Microalb, Ur Waived: 80 mg/L — ABNORMAL HIGH (ref 0–19)
Microalb/Creat Ratio: <30 mg/g (ref <30)

## 2024-05-18 LAB — BAYER DCA HB A1C WAIVED: HB A1C (BAYER DCA - WAIVED): 5.6 % (ref 4.8–5.6)

## 2024-05-18 MED ORDER — BREZTRI AEROSPHERE 160-9-4.8 MCG/ACT IN AERO: 2.0000 | INHALATION_SPRAY | Freq: Two times a day (BID) | RESPIRATORY_TRACT | 11 refills | Status: DC

## 2024-05-18 MED ORDER — LEVOTHYROXINE SODIUM 125 MCG PO TABS: 125.0000 ug | ORAL_TABLET | Freq: Every day | ORAL | 3 refills | Status: AC

## 2024-05-18 MED ORDER — GABAPENTIN 300 MG PO CAPS: ORAL_CAPSULE | ORAL | 4 refills | Status: AC

## 2024-05-18 MED ORDER — FLUOXETINE HCL 20 MG PO TABS: 20.0000 mg | ORAL_TABLET | Freq: Every day | ORAL | 3 refills | Status: AC

## 2024-05-18 MED ORDER — FAMOTIDINE 20 MG PO TABS: ORAL_TABLET | ORAL | 3 refills | Status: AC

## 2024-05-18 MED ORDER — ROSUVASTATIN CALCIUM 20 MG PO TABS: 20.0000 mg | ORAL_TABLET | Freq: Every day | ORAL | 3 refills | Status: AC

## 2024-05-18 MED ORDER — FLUTICASONE PROPIONATE 50 MCG/ACT NA SUSP: NASAL | 4 refills | Status: AC

## 2024-05-18 MED ORDER — MONTELUKAST SODIUM 10 MG PO TABS: 10.0000 mg | ORAL_TABLET | Freq: Every day | ORAL | 3 refills | Status: AC

## 2024-05-18 MED ORDER — PREDNISONE 20 MG PO TABS: 40.0000 mg | ORAL_TABLET | Freq: Every day | ORAL | 0 refills | Status: AC

## 2024-05-18 MED ORDER — ALBUTEROL SULFATE HFA 108 (90 BASE) MCG/ACT IN AERS: 2.0000 | INHALATION_SPRAY | Freq: Four times a day (QID) | RESPIRATORY_TRACT | 4 refills | Status: AC | PRN

## 2024-05-18 NOTE — Assessment & Plan Note (Signed)
 Chronic, ongoing.  Continue current medication regimen and adjust as needed based on labs. Recheck TSH, Free T4 in the fall.

## 2024-05-18 NOTE — Assessment & Plan Note (Signed)
 Ongoing, stable.  Recheck labs today.  Recommend good hydration at home.  Avoid Ibuprofen products.

## 2024-05-18 NOTE — Assessment & Plan Note (Signed)
 Ongoing issue and she feels it is worsening.  Will placed new referral to return to ENT.

## 2024-05-18 NOTE — Assessment & Plan Note (Signed)
 Chronic, ongoing.  She has improved symptoms with Breztri  on board, will maintain this and continue to work with PharmD on cost assistance.  Continue Albuterol  PRN and nebs at home.  Would benefit return to pulmonary, but refuses at this time.  She does report rinsing mouth well after Breztri  and no swallowing of rinse water.

## 2024-05-18 NOTE — Assessment & Plan Note (Signed)
 Chronic, ongoing.  Continue current medication regimen and adjust as needed. Lipid panel today.

## 2024-05-18 NOTE — Assessment & Plan Note (Signed)
Chronic.  Noted on imaging 12/30/2020 -- continue statin therapy.

## 2024-05-18 NOTE — Assessment & Plan Note (Signed)
 Chronic, ongoing.  At this time will continue Trazodone  as needed for sleep.  Recommend she trial Melatonin vs taking Trazodone .  Could consider sleep study in future if ongoing issue.

## 2024-05-18 NOTE — Assessment & Plan Note (Signed)
 A1c check today due to past elevations, initiate medication as needed.  A1c today has trended down to 5.6%, praised for this.

## 2024-05-18 NOTE — Assessment & Plan Note (Signed)
 Chronic, stable.  Denies SI/HI.  At this time will continue Wellbutrin  XL 300 MG daily and maintain Prozac  + Trazodone  as needed for sleep.  Could consider in future discontinuation of Prozac  and change to Duloxetine which may also benefit her chronic pain, along with mood.

## 2024-05-18 NOTE — Assessment & Plan Note (Signed)
 Ongoing, will continue collaboration with ENT and pulmonary.  Appreciate their input, recent labs and notes reviewed.  Prednisone  40 MG daily for 5 days sent in to help lingering cough.

## 2024-05-18 NOTE — Progress Notes (Signed)
 BP 126/80 (BP Location: Left Arm, Patient Position: Sitting, Cuff Size: Normal)   Pulse 93   Temp 98.4 F (36.9 C) (Oral)   Ht 5' 6.8" (1.697 m)   Wt 206 lb 12.8 oz (93.8 kg)   LMP  (LMP Unknown)   SpO2 97%   BMI 32.58 kg/m    Subjective:    Patient ID: Audrey Richard, female    DOB: 10-30-1961, 63 y.o.   MRN: [id number]  HPI: Audrey Richard is a 63 y.o. female  Chief Complaint  Patient presents with   COPD   Hyperlipidemia   Hypothyroidism   HYPERLIPIDEMIA Continues on Rosuvastatin  20 MG.   Hyperlipidemia status: good compliance Satisfied with current treatment?  yes Side effects:  no Medication compliance: good compliance Supplements: none Aspirin:  no The 10-year ASCVD risk score (Arnett DK, et al., 2019) is: 3%   Values used to calculate the score:     Age: 70 years     Sex: Female     Is Non-Hispanic African American: No     Diabetic: No     Tobacco smoker: No     Systolic Blood Pressure: 126 mmHg     Is BP treated: No     HDL Cholesterol: 64 mg/dL     Total Cholesterol: 149 mg/dL Chest pain:  no Coronary artery disease:  no Family history CAD:  yes Family history early CAD:  no    HYPOTHYROID Continues Levothyroxine  125 MCG daily. Thyroid  control status:stable Satisfied with current treatment? yes Medication side effects: no Medication compliance: good compliance Etiology of hypothyroidism: Hashimoto's Recent dose adjustment:no Fatigue: yes Cold intolerance: no Heat intolerance: no Weight gain: no Weight loss: no Constipation: yes Diarrhea/loose stools: no Palpitations: no Lower extremity edema: no Anxiety/depressed mood: no    ASTHMA AND ALLERGIES Continues on Breztri  and Albuterol . Uses nebulizers as needed. Taking Singulair  and Zyrtec  + Flonase .  Recently was treated for exacerbation on 05/02/24 with Doxycycline , this did not offer much benefit she reports and then went to urgent care locally, they changed abx to Levaquin .  Still does  not feel 100%, lack of energy and at night throat feels like it is on fire. Is breathing better. Works at [employer] and is exposed often to sick people.  Does not always wear mask when working.  Night is when she feels the worst.  Takes Omeprazole  daily, gets OTC.   History: Saw Dr. Waymond Hailey with pulmonary in past -- suspected to have asthmatic bronchitis and chronic airway cough syndrome.  Visit 08/06/21 with pulmonary and she was referred to ENT who she had initial visit with, Dr. Brent Cambric, on 08/19/21 -- treated with Bactrim  and Prednisone . Returned to ENT on 10/08/21 and no changes made.  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 COPD status: stable Satisfied with current treatment?: yes Oxygen use: no Dyspnea frequency: improving Cough frequency: improving Rescue inhaler frequency: recently due to exacerbation Limitation of activity: no Productive cough: none Last Spirometry: with pulmonary Pneumovax: Up to Date Influenza: Up to Date   Impaired Fasting Glucose HbA1C:  Lab Results  Component Value Date   HGBA1C 5.6 05/18/2024  Duration of elevated blood sugar:  Polydipsia: no Polyuria: no Weight change: no Visual disturbance: no Glucose Monitoring: no    Accucheck frequency: Not Checking    Fasting glucose:     Post prandial:  Diabetic Education: Not Completed Family history of diabetes: yes - daughter   CHRONIC KIDNEY DISEASE CKD status: stable Medications renally dose:  yes Previous renal evaluation: no Pneumovax:  Up to Date Influenza Vaccine:  Up to Date    DEPRESSION Continues to take Wellbutrin , Prozac , and Trazodone , which she takes as a sleep aide. Mood status: stable Satisfied with current treatment?: yes Symptom severity: moderate  Duration of current treatment : chronic Side effects: no Medication compliance: good compliance Psychotherapy/counseling: none Previous psychiatric medications: none Depressed mood: occasional Anxious mood:  occasional Anhedonia: occasional Significant weight loss or gain: no Insomnia: none Fatigue: yes  Feelings of worthlessness or guilt: no Impaired concentration/indecisiveness: no Suicidal ideations: no Hopelessness: no Crying spells: yes    05/18/2024    3:11 PM 01/04/2024    3:22 PM 11/09/2023    4:05 PM 01/05/2023    3:44 PM 12/17/2022    2:59 PM  Depression screen PHQ 2/9  Decreased Interest 3 2 3 2 3   Down, Depressed, Hopeless 2 1 3 1 2   PHQ - 2 Score 5 3 6 3 5   Altered sleeping 1 0 2 2 3   Tired, decreased energy 1 2 2 3 3   Change in appetite 1 0 0 1 2  Feeling bad or failure about yourself  1 1 2 1 2   Trouble concentrating 0 0 0 0 1  Moving slowly or fidgety/restless 1 0 0 0 2  Suicidal thoughts 0 0 0 0 1  PHQ-9 Score 10 6 12 10 19   Difficult doing work/chores Somewhat difficult Somewhat difficult Extremely dIfficult Somewhat difficult Not difficult at all       05/18/2024    3:11 PM 01/04/2024    3:23 PM 11/09/2023    4:05 PM 01/05/2023    3:44 PM  GAD 7 : Generalized Anxiety Score  Nervous, Anxious, on Edge 0 1 1 1   Control/stop worrying 1 1 2 2   Worry too much - different things 1 1 3 2   Trouble relaxing 1 0 2 1  Restless 0 0 0 0  Easily annoyed or irritable 1 0 2 2  Afraid - awful might happen 0 0 1 1  Total GAD 7 Score 4 3 11 9   Anxiety Difficulty Somewhat difficult Somewhat difficult Extremely difficult Somewhat difficult   Relevant past medical, surgical, family and social history reviewed and updated as indicated. Interim medical history since our last visit reviewed. Allergies and medications reviewed and updated.  Review of Systems  Constitutional:  Negative for activity change, appetite change, diaphoresis, fatigue and fever.  HENT:  Positive for voice change.   Respiratory:  Positive for cough. Negative for chest tightness, shortness of breath and wheezing.   Cardiovascular:  Negative for chest pain, palpitations and leg swelling.  Neurological:  Negative.   Psychiatric/Behavioral: Negative.      Per HPI unless specifically indicated above     Objective:     BP 126/80 (BP Location: Left Arm, Patient Position: Sitting, Cuff Size: Normal)   Pulse 93   Temp 98.4 F (36.9 C) (Oral)   Ht 5' 6.8" (1.697 m)   Wt 206 lb 12.8 oz (93.8 kg)   LMP  (LMP Unknown)   SpO2 97%   BMI 32.58 kg/m   Wt Readings from Last 3 Encounters:  05/18/24 206 lb 12.8 oz (93.8 kg)  01/05/24 212 lb (96.2 kg)  01/04/24 212 lb (96.2 kg)    Physical Exam Vitals and nursing note reviewed.  Constitutional:      General: She is awake. She is not in acute distress.    Appearance: She is well-developed and well-groomed.  She is obese. She is not ill-appearing or toxic-appearing.  HENT:     Head: Normocephalic.     Right Ear: Hearing and external ear normal.     Left Ear: Hearing and external ear normal.  Eyes:     General: Lids are normal.        Right eye: No discharge.        Left eye: No discharge.     Conjunctiva/sclera: Conjunctivae normal.     Pupils: Pupils are equal, round, and reactive to light.  Neck:     Thyroid : No thyromegaly.     Vascular: No carotid bruit.  Cardiovascular:     Rate and Rhythm: Normal rate and regular rhythm.     Heart sounds: Normal heart sounds. No murmur heard.    No gallop.  Pulmonary:     Effort: Pulmonary effort is normal. No accessory muscle usage or respiratory distress.     Breath sounds: Normal breath sounds. No decreased breath sounds, wheezing or rales.  Abdominal:     General: Bowel sounds are normal. There is no distension.     Palpations: Abdomen is soft.     Tenderness: There is no abdominal tenderness.  Musculoskeletal:     Right hand: Normal.     Left hand: Normal.     Cervical back: Normal range of motion and neck supple.     Thoracic back: Normal.     Lumbar back: Normal.     Right knee: Normal.     Left knee: Normal.     Right lower leg: No edema.     Left lower leg: No edema.   Lymphadenopathy:     Cervical: No cervical adenopathy.  Skin:    General: Skin is warm and dry.  Neurological:     Mental Status: She is alert and oriented to person, place, and time.     Deep Tendon Reflexes: Reflexes are normal and symmetric.     Reflex Scores:      Brachioradialis reflexes are 2+ on the right side and 2+ on the left side.      Patellar reflexes are 2+ on the right side and 2+ on the left side. Psychiatric:        Attention and Perception: Attention normal.        Mood and Affect: Mood normal.        Speech: Speech normal.        Behavior: Behavior normal. Behavior is cooperative.        Thought Content: Thought content normal.     Results for orders placed or performed in visit on 05/18/24  Bayer DCA Hb A1c Waived   Collection Time: 05/18/24  3:10 PM  Result Value Ref Range   HB A1C (BAYER DCA - WAIVED) 5.6 4.8 - 5.6 %  Microalbumin, Urine Waived   Collection Time: 05/18/24  3:10 PM  Result Value Ref Range   Microalb, Ur Waived 80 (H) 0 - 19 mg/L   Creatinine, Urine Waived 200 10 - 300 mg/dL   Microalb/Creat Ratio <30 <30 mg/g      Assessment & Plan:   Problem List Items Addressed This Visit       Cardiovascular and Mediastinum   Aortic atherosclerosis (HCC)   Chronic.  Noted on imaging 12/30/2020 -- continue statin therapy.      Relevant Medications   rosuvastatin  (CRESTOR ) 20 MG tablet     Respiratory   Ulcerative laryngitis   Ongoing issue and she feels  it is worsening.  Will placed new referral to return to ENT.      Relevant Orders   Ambulatory referral to ENT   Asthmatic bronchitis , chronic (HCC)   Chronic, ongoing.  She has improved symptoms with Breztri  on board, will maintain this and continue to work with PharmD on cost assistance.  Continue Albuterol  PRN and nebs at home.  Would benefit return to pulmonary, but refuses at this time.  She does report rinsing mouth well after Breztri  and no swallowing of rinse water.      Relevant  Medications   predniSONE  (DELTASONE ) 20 MG tablet   budesonide -glycopyrrolate-formoterol  (BREZTRI  AEROSPHERE) 160-9-4.8 MCG/ACT AERO inhaler   albuterol  (VENTOLIN  HFA) 108 (90 Base) MCG/ACT inhaler   montelukast  (SINGULAIR ) 10 MG tablet   Other Relevant Orders   CBC with Differential/Platelet     Endocrine   Hashimoto's thyroiditis   Chronic, ongoing.  Continue current medication regimen and adjust as needed based on labs. Recheck TSH, Free T4 in the fall.       Relevant Medications   levothyroxine  (SYNTHROID ) 125 MCG tablet     Genitourinary   Chronic kidney disease, stage 3a (HCC) - Primary   Ongoing, stable.  Recheck labs today.  Recommend good hydration at home.  Avoid Ibuprofen  products.      Relevant Orders   Microalbumin, Urine Waived (Completed)   CBC with Differential/Platelet     Other   Upper airway cough syndrome   Ongoing, will continue collaboration with ENT and pulmonary.  Appreciate their input, recent labs and notes reviewed.  Prednisone  40 MG daily for 5 days sent in to help lingering cough.      Relevant Medications   fluticasone  (FLONASE ) 50 MCG/ACT nasal spray   Other Relevant Orders   Ambulatory referral to ENT   Obesity   BMI 32.58.  Recommended eating smaller high protein, low fat meals more frequently and exercising 30 mins a day 5 times a week with a goal of 10-15lb weight loss in the next 3 months. Patient voiced their understanding and motivation to adhere to these recommendations.       Mixed hyperlipidemia   Chronic, ongoing.  Continue current medication regimen and adjust as needed.  Lipid panel today.      Relevant Medications   rosuvastatin  (CRESTOR ) 20 MG tablet   Other Relevant Orders   Comprehensive metabolic panel with GFR   Lipid Panel w/o Chol/HDL Ratio   Insomnia   Chronic, ongoing.  At this time will continue Trazodone  as needed for sleep.  Recommend she trial Melatonin vs taking Trazodone .  Could consider sleep study in  future if ongoing issue.       Relevant Medications   gabapentin  (NEURONTIN ) 300 MG capsule   Elevated hemoglobin A1c   A1c check today due to past elevations, initiate medication as needed.  A1c today has trended down to 5.6%, praised for this.      Relevant Orders   Bayer DCA Hb A1c Waived (Completed)   Microalbumin, Urine Waived (Completed)   Depression   Chronic, stable.  Denies SI/HI.  At this time will continue Wellbutrin  XL 300 MG daily and maintain Prozac  + Trazodone  as needed for sleep.  Could consider in future discontinuation of Prozac  and change to Duloxetine which may also benefit her chronic pain, along with mood.       Relevant Medications   FLUoxetine  (PROZAC ) 20 MG tablet   Other Visit Diagnoses       Encounter  for screening mammogram for malignant neoplasm of breast       Mammogram ordered and instructed how to schedule.   Relevant Orders   MM 3D SCREENING MAMMOGRAM BILATERAL BREAST        Follow up plan: Return in about 3 months (around 08/18/2024) for ASTHMA.

## 2024-05-18 NOTE — Assessment & Plan Note (Signed)
BMI 32.58.  Recommended eating smaller high protein, low fat meals more frequently and exercising 30 mins a day 5 times a week with a goal of 10-15lb weight loss in the next 3 months. Patient voiced their understanding and motivation to adhere to these recommendations.

## 2024-05-19 ENCOUNTER — Ambulatory Visit: Payer: Self-pay | Admitting: Nurse Practitioner

## 2024-05-19 LAB — LIPID PANEL W/O CHOL/HDL RATIO
Cholesterol, Total: 168 mg/dL (ref 100–199)
HDL: 71 mg/dL (ref >39)
LDL Chol Calc (NIH): 74 mg/dL (ref 0–99)
Triglycerides: 131 mg/dL (ref 0–149)
VLDL Cholesterol Cal: 23 mg/dL (ref 5–40)

## 2024-05-19 LAB — COMPREHENSIVE METABOLIC PANEL WITH GFR
ALT: 24 IU/L (ref 0–32)
AST: 17 IU/L (ref 0–40)
Albumin: 4.3 g/dL (ref 3.9–4.9)
Alkaline Phosphatase: 58 IU/L (ref 44–121)
BUN/Creatinine Ratio: 10 — ABNORMAL LOW (ref 12–28)
BUN: 14 mg/dL (ref 8–27)
Bilirubin Total: 0.4 mg/dL (ref 0.0–1.2)
CO2: 25 mmol/L (ref 20–29)
Calcium: 9.1 mg/dL (ref 8.7–10.3)
Chloride: 100 mmol/L (ref 96–106)
Creatinine, Ser: 1.35 mg/dL — ABNORMAL HIGH (ref 0.57–1.00)
Globulin, Total: 1.9 g/dL (ref 1.5–4.5)
Glucose: 66 mg/dL — ABNORMAL LOW (ref 70–99)
Potassium: 4.4 mmol/L (ref 3.5–5.2)
Sodium: 140 mmol/L (ref 134–144)
Total Protein: 6.2 g/dL (ref 6.0–8.5)
eGFR: 44 mL/min/{1.73_m2} — ABNORMAL LOW (ref >59)

## 2024-05-19 LAB — CBC WITH DIFFERENTIAL/PLATELET
Basophils Absolute: 0 10*3/uL (ref 0.0–0.2)
Basos: 1 %
EOS (ABSOLUTE): 0.2 10*3/uL (ref 0.0–0.4)
Eos: 3 %
Hematocrit: 44.9 % (ref 34.0–46.6)
Hemoglobin: 14.6 g/dL (ref 11.1–15.9)
Immature Grans (Abs): 0.1 10*3/uL (ref 0.0–0.1)
Immature Granulocytes: 1 %
Lymphocytes Absolute: 1.3 10*3/uL (ref 0.7–3.1)
Lymphs: 16 %
MCH: 31 pg (ref 26.6–33.0)
MCHC: 32.5 g/dL (ref 31.5–35.7)
MCV: 95 fL (ref 79–97)
Monocytes Absolute: 0.7 10*3/uL (ref 0.1–0.9)
Monocytes: 8 %
Neutrophils Absolute: 6 10*3/uL (ref 1.4–7.0)
Neutrophils: 71 %
Platelets: 271 10*3/uL (ref 150–450)
RBC: 4.71 x10E6/uL (ref 3.77–5.28)
RDW: 13.2 % (ref 11.7–15.4)
WBC: 8.3 10*3/uL (ref 3.4–10.8)

## 2024-05-19 NOTE — Progress Notes (Signed)
 Contacted via MyChart   Good morning Audrey Richard, your labs have returned: - Kidney function has declined a little again this check.  We will continue to monitor your creatinine and eGFR.  Ensure good water intake at home and try to avoid Ibuprofen . - Remainder of labs stable.  No changes needed.  Any questions? Keep being stellar!!  Thank you for allowing me to participate in your care.  I appreciate you. Kindest regards, Javonna Balli

## 2024-05-27 ENCOUNTER — Telehealth: Payer: Self-pay | Admitting: Nurse Practitioner

## 2024-05-27 NOTE — Telephone Encounter (Signed)
 Patient brought in FMLA paperwork to be completed by the provider. It  has been placed in the folder for completion.

## 2024-05-30 NOTE — Telephone Encounter (Signed)
 Paper work received, Will start and then give to provider to complete and sign.

## 2024-05-31 NOTE — Telephone Encounter (Signed)
 Paperwork has been given to provider to complete and sign.

## 2024-06-02 NOTE — Telephone Encounter (Signed)
 Paperwork has been completed and faxed in for the patient. Called and LVM letting patient know that this was done for her. Also advised patient that there is a copy up front if she needed to pick it up.

## 2024-06-29 ENCOUNTER — Other Ambulatory Visit: Payer: Self-pay | Admitting: Unknown Physician Specialty

## 2024-06-29 DIAGNOSIS — R1310 Dysphagia, unspecified: Secondary | ICD-10-CM

## 2024-08-03 ENCOUNTER — Other Ambulatory Visit: Payer: Self-pay | Admitting: Nurse Practitioner

## 2024-08-03 DIAGNOSIS — R058 Other specified cough: Secondary | ICD-10-CM

## 2024-08-04 ENCOUNTER — Ambulatory Visit
Admission: RE | Admit: 2024-08-04 | Discharge: 2024-08-04 | Disposition: A | Discharge status: Home / Self Care | Source: Ambulatory Visit | Attending: Unknown Physician Specialty | Admitting: Unknown Physician Specialty

## 2024-08-04 DIAGNOSIS — R1310 Dysphagia, unspecified: Secondary | ICD-10-CM

## 2024-08-05 NOTE — Telephone Encounter (Signed)
 Requested medication (s) are due for refill today: yes  Requested medication (s) are on the active medication list: yes  Last refill:  02/04/24 #90 1 RF   Future visit scheduled: no  Notes to clinic:  Creatine was  elevated at last check 5/25   Requested Prescriptions  Pending Prescriptions Disp Refills   cetirizine (ZYRTEC) 10 MG tablet [Pharmacy Med Name: Cetirizine HCl 10 MG Oral Tablet] 90 tablet 0    Sig: Take 1 tablet by mouth once daily     Ear, Nose, and Throat:  Antihistamines 2 Failed - 08/05/2024  1:32 PM      Failed - Cr in normal range and within 360 days    Creatinine  Date Value Ref Range Status  02/14/2014 1.15 0.60 - 1.30 mg/dL Final   Creatinine, Ser  Date Value Ref Range Status  05/18/2024 1.35 (H) 0.57 - 1.00 mg/dL Final         Passed - Valid encounter within last 12 months    Recent Outpatient Visits           2 months ago Chronic kidney disease, stage 3a (HCC)   [facility] [place], Melanie DASEN, NP

## 2024-08-21 DIAGNOSIS — K449 Diaphragmatic hernia without obstruction or gangrene: Secondary | ICD-10-CM | POA: Insufficient documentation

## 2024-08-21 NOTE — Patient Instructions (Incomplete)
 Please call to schedule your mammogram and/or bone density: [facility] at [facility]  Address: [address], [place], [place] [address] Phone: [phone]  [facility] [address] [place],  [place]  [address] Phone: [phone]    Asthma, Adult  Asthma is a condition that causes swelling and narrowing of the airways. These are the passages that lead from the nose and mouth down into the lungs. When asthma symptoms get worse it is called an asthma attack or flare. This can make it hard to breathe. Asthma flares can range from minor to life-threatening. There is no cure for asthma, but medicines and lifestyle changes can help to control it. What are the causes? It is not known exactly what causes asthma, but certain things can cause asthma symptoms to get worse (triggers). What can trigger an asthma attack? Cigarette smoke. Mold. Dust. Your pet's skin flakes (dander). Cockroaches. Pollen. Air pollution (like household cleaners, wood smoke, smog, or Therapist, occupational). What are the signs or symptoms? Trouble breathing (shortness of breath). Coughing. Making high-pitched whistling sounds when you breathe, most often when you breathe out (wheezing). Chest tightness. Tiredness with little activity. Poor exercise tolerance. How is this treated? Controller medicines that help prevent asthma symptoms. Fast-acting reliever or rescue medicines. These give short-term relief of asthma symptoms. Allergy medicines if your attacks are brought on by allergens. Medicines to help control the body's defense (immune) system. Staying away from the things that cause asthma attacks. Follow these instructions at home: Avoiding triggers in your home Do not allow anyone to smoke in your home. Limit use of fireplaces and wood stoves. Get rid of pests (such as roaches and mice) and their droppings. Keep your home clean. Clean your  floors. Dust regularly. Use cleaning products that do not smell. Wash bed sheets and blankets every week in hot water. Dry them in a dryer. Have someone vacuum when you are not home. Change your heating and air conditioning filters often. Use blankets that are made of polyester or cotton. General instructions Take over-the-counter and prescription medicines only as told by your doctor. Do not smoke or use any products that contain nicotine or tobacco. If you need help quitting, ask your doctor. Stay away from secondhand smoke. Avoid doing things outdoors when allergen counts are high and when air quality is low. Warm up before you exercise. Take time to cool down after exercise. Use a peak flow meter as told by your doctor. A peak flow meter is a tool that measures how well your lungs are working. Keep track of the peak flow meter's readings. Write them down. Follow your asthma action plan. This is a written plan for taking care of your asthma and treating your attacks. Make sure you get all the shots (vaccines) that your doctor recommends. Ask your doctor about a flu shot and a pneumonia shot. Keep all follow-up visits. Contact a doctor if: You have wheezing, shortness of breath, or a cough even while taking medicine to prevent attacks. The mucus you cough up (sputum) is thicker than usual. The mucus you cough up changes from clear or white to yellow, green, gray, or is bloody. You have problems from the medicine you are taking, such as: A rash. Itching. Swelling. Trouble breathing. You need reliever medicines more than 2-3 times a week. Your peak flow reading is still at 50-79% of your personal best after following the action plan for 1 hour. You  have a fever. Get help right away if: You seem to be worse and are not responding to medicine during an asthma attack. You are short of breath even at rest. You get short of breath when doing very little activity. You have trouble eating,  drinking, or talking. You have chest pain or tightness. You have a fast heartbeat. Your lips or fingernails start to turn blue. You are light-headed or dizzy, or you faint. Your peak flow is less than 50% of your personal best. You feel too tired to breathe normally. These symptoms may be an emergency. Get help right away. Call 911. Do not wait to see if the symptoms will go away. Do not drive yourself to the hospital. Summary Asthma is a long-term (chronic) condition in which the airways get tight and narrow. An asthma attack can make it hard to breathe. Asthma cannot be cured, but medicines and lifestyle changes can help control it. Make sure you understand how to avoid triggers and how and when to use your medicines. Avoid things that can cause allergy symptoms (allergens). These include animal skin flakes (dander) and pollen from trees or grass. Avoid things that pollute the air. These may include household cleaners, wood smoke, smog, or chemical odors. This information is not intended to replace advice given to you by your health care provider. Make sure you discuss any questions you have with your health care provider. Document Revised: 09/16/2021 Document Reviewed: 09/16/2021 Elsevier Patient Education  2024 [employer].

## 2024-08-23 ENCOUNTER — Ambulatory Visit: Admitting: Nurse Practitioner

## 2024-08-23 DIAGNOSIS — Z23 Encounter for immunization: Secondary | ICD-10-CM

## 2024-08-23 DIAGNOSIS — J040 Acute laryngitis: Secondary | ICD-10-CM

## 2024-08-23 DIAGNOSIS — R058 Other specified cough: Secondary | ICD-10-CM

## 2024-08-23 DIAGNOSIS — Z1211 Encounter for screening for malignant neoplasm of colon: Secondary | ICD-10-CM

## 2024-08-23 DIAGNOSIS — K449 Diaphragmatic hernia without obstruction or gangrene: Secondary | ICD-10-CM

## 2024-08-23 DIAGNOSIS — J4489 Other specified chronic obstructive pulmonary disease: Secondary | ICD-10-CM

## 2024-09-04 ENCOUNTER — Other Ambulatory Visit: Payer: Self-pay | Admitting: Nurse Practitioner

## 2024-09-04 DIAGNOSIS — F324 Major depressive disorder, single episode, in partial remission: Secondary | ICD-10-CM

## 2024-09-06 NOTE — Telephone Encounter (Signed)
 Requested Prescriptions  Pending Prescriptions Disp Refills   traZODone  (DESYREL ) 50 MG tablet [Pharmacy Med Name: traZODone  HCl 50 MG Oral Tablet] 90 tablet 0    Sig: TAKE 1 TABLET BY MOUTH AT BEDTIME AS NEEDED     Psychiatry: Antidepressants - Serotonin Modulator Passed - 09/06/2024  9:45 AM      Passed - Completed PHQ-2 or PHQ-9 in the last 360 days      Passed - Valid encounter within last 6 months    Recent Outpatient Visits           3 months ago Chronic kidney disease, stage 3a (HCC)   [facility] [place], Melanie DASEN, NP

## 2024-09-14 ENCOUNTER — Other Ambulatory Visit: Payer: Self-pay | Admitting: Nurse Practitioner

## 2024-09-14 DIAGNOSIS — G8929 Other chronic pain: Secondary | ICD-10-CM

## 2024-09-15 NOTE — Telephone Encounter (Signed)
 Requested medications are due for refill today.  yes  Requested medications are on the active medications list.  yes  Last refill. 08/12/2023 #90 4 rf  Future visit scheduled.   yes  Notes to clinic.  Refill not delegated.    Requested Prescriptions  Pending Prescriptions Disp Refills   cyclobenzaprine  (FLEXERIL ) 10 MG tablet [Pharmacy Med Name: Cyclobenzaprine  HCl 10 MG Oral Tablet] 90 tablet 0    Sig: TAKE 1 TABLET BY MOUTH AT BEDTIME     Not Delegated - Analgesics:  Muscle Relaxants Failed - 09/15/2024  2:50 PM      Failed - This refill cannot be delegated      Passed - Valid encounter within last 6 months    Recent Outpatient Visits           4 months ago Chronic kidney disease, stage 3a (HCC)   [place] [place], Melanie DASEN, NP

## 2024-10-22 NOTE — Patient Instructions (Incomplete)
 Asthma, Adult  Asthma is a condition that causes swelling and narrowing of the airways. These are the passages that lead from the nose and mouth down into the lungs. When asthma symptoms get worse it is called an asthma attack or flare. This can make it hard to breathe. Asthma flares can range from minor to life-threatening. There is no cure for asthma, but medicines and lifestyle changes can help to control it. What are the causes? It is not known exactly what causes asthma, but certain things can cause asthma symptoms to get worse (triggers). What can trigger an asthma attack? Cigarette smoke. Mold. Dust. Your pet's skin flakes (dander). Cockroaches. Pollen. Air pollution (like household cleaners, wood smoke, smog, or Therapist, occupational). What are the signs or symptoms? Trouble breathing (shortness of breath). Coughing. Making high-pitched whistling sounds when you breathe, most often when you breathe out (wheezing). Chest tightness. Tiredness with little activity. Poor exercise tolerance. How is this treated? Controller medicines that help prevent asthma symptoms. Fast-acting reliever or rescue medicines. These give short-term relief of asthma symptoms. Allergy medicines if your attacks are brought on by allergens. Medicines to help control the body's defense (immune) system. Staying away from the things that cause asthma attacks. Follow these instructions at home: Avoiding triggers in your home Do not allow anyone to smoke in your home. Limit use of fireplaces and wood stoves. Get rid of pests (such as roaches and mice) and their droppings. Keep your home clean. Clean your floors. Dust regularly. Use cleaning products that do not smell. Wash bed sheets and blankets every week in hot water. Dry them in a dryer. Have someone vacuum when you are not home. Change your heating and air conditioning filters often. Use blankets that are made of polyester or cotton. General  instructions Take over-the-counter and prescription medicines only as told by your doctor. Do not smoke or use any products that contain nicotine or tobacco. If you need help quitting, ask your doctor. Stay away from secondhand smoke. Avoid doing things outdoors when allergen counts are high and when air quality is low. Warm up before you exercise. Take time to cool down after exercise. Use a peak flow meter as told by your doctor. A peak flow meter is a tool that measures how well your lungs are working. Keep track of the peak flow meter's readings. Write them down. Follow your asthma action plan. This is a written plan for taking care of your asthma and treating your attacks. Make sure you get all the shots (vaccines) that your doctor recommends. Ask your doctor about a flu shot and a pneumonia shot. Keep all follow-up visits. Contact a doctor if: You have wheezing, shortness of breath, or a cough even while taking medicine to prevent attacks. The mucus you cough up (sputum) is thicker than usual. The mucus you cough up changes from clear or white to yellow, green, gray, or is bloody. You have problems from the medicine you are taking, such as: A rash. Itching. Swelling. Trouble breathing. You need reliever medicines more than 2-3 times a week. Your peak flow reading is still at 50-79% of your personal best after following the action plan for 1 hour. You have a fever. Get help right away if: You seem to be worse and are not responding to medicine during an asthma attack. You are short of breath even at rest. You get short of breath when doing very little activity. You have trouble eating, drinking, or talking. You have chest  pain or tightness. You have a fast heartbeat. Your lips or fingernails start to turn blue. You are light-headed or dizzy, or you faint. Your peak flow is less than 50% of your personal best. You feel too tired to breathe normally. These symptoms may be an  emergency. Get help right away. Call 911. Do not wait to see if the symptoms will go away. Do not drive yourself to the hospital. Summary Asthma is a long-term (chronic) condition in which the airways get tight and narrow. An asthma attack can make it hard to breathe. Asthma cannot be cured, but medicines and lifestyle changes can help control it. Make sure you understand how to avoid triggers and how and when to use your medicines. Avoid things that can cause allergy symptoms (allergens). These include animal skin flakes (dander) and pollen from trees or grass. Avoid things that pollute the air. These may include household cleaners, wood smoke, smog, or chemical odors. This information is not intended to replace advice given to you by your health care provider. Make sure you discuss any questions you have with your health care provider. Document Revised: 09/16/2021 Document Reviewed: 09/16/2021 Elsevier Patient Education  2024 [employer].

## 2024-10-25 ENCOUNTER — Ambulatory Visit: Admitting: Nurse Practitioner

## 2024-10-25 DIAGNOSIS — F5104 Psychophysiologic insomnia: Secondary | ICD-10-CM

## 2024-10-25 DIAGNOSIS — E063 Autoimmune thyroiditis: Secondary | ICD-10-CM

## 2024-10-25 DIAGNOSIS — E538 Deficiency of other specified B group vitamins: Secondary | ICD-10-CM

## 2024-10-25 DIAGNOSIS — R7309 Other abnormal glucose: Secondary | ICD-10-CM

## 2024-10-25 DIAGNOSIS — N1831 Chronic kidney disease, stage 3a: Secondary | ICD-10-CM

## 2024-10-25 DIAGNOSIS — Z1211 Encounter for screening for malignant neoplasm of colon: Secondary | ICD-10-CM

## 2024-10-25 DIAGNOSIS — M8588 Other specified disorders of bone density and structure, other site: Secondary | ICD-10-CM

## 2024-10-25 DIAGNOSIS — E782 Mixed hyperlipidemia: Secondary | ICD-10-CM

## 2024-10-25 DIAGNOSIS — F324 Major depressive disorder, single episode, in partial remission: Secondary | ICD-10-CM

## 2024-10-25 DIAGNOSIS — J4489 Other specified chronic obstructive pulmonary disease: Secondary | ICD-10-CM

## 2024-10-25 DIAGNOSIS — K219 Gastro-esophageal reflux disease without esophagitis: Secondary | ICD-10-CM

## 2024-10-25 DIAGNOSIS — Z23 Encounter for immunization: Secondary | ICD-10-CM

## 2024-12-21 ENCOUNTER — Telehealth: Admitting: Physician Assistant

## 2024-12-21 DIAGNOSIS — J4489 Other specified chronic obstructive pulmonary disease: Secondary | ICD-10-CM | POA: Diagnosis not present

## 2024-12-21 MED ORDER — PROMETHAZINE-DM 6.25-15 MG/5ML PO SYRP: 5.0000 mL | ORAL_SOLUTION | Freq: Four times a day (QID) | ORAL | 0 refills | Status: AC | PRN

## 2024-12-21 MED ORDER — DOXYCYCLINE HYCLATE 100 MG PO TABS: 100.0000 mg | ORAL_TABLET | Freq: Two times a day (BID) | ORAL | 0 refills | Status: DC

## 2024-12-21 MED ORDER — PREDNISONE 10 MG (21) PO TBPK: ORAL_TABLET | ORAL | 0 refills | Status: DC

## 2024-12-21 NOTE — Progress Notes (Signed)
 " Virtual Visit Consent   [name], you are scheduled for a virtual visit with a [facility] provider today. Just as with appointments in the office, your consent must be obtained to participate. Your consent will be active for this visit and any virtual visit you may have with one of our providers in the next 365 days. If you have a MyChart account, a copy of this consent can be sent to you electronically.  As this is a virtual visit, video technology does not allow for your provider to perform a traditional examination. This may limit your provider's ability to fully assess your condition. If your provider identifies any concerns that need to be evaluated in person or the need to arrange testing (such as labs, EKG, etc.), we will make arrangements to do so. Although advances in technology are sophisticated, we cannot ensure that it will always work on either your end or our end. If the connection with a video visit is poor, the visit may have to be switched to a telephone visit. With either a video or telephone visit, we are not always able to ensure that we have a secure connection.  By engaging in this virtual visit, you consent to the provision of healthcare and authorize for your insurance to be billed (if applicable) for the services provided during this visit. Depending on your insurance coverage, you may receive a charge related to this service.  I need to obtain your verbal consent now. Are you willing to proceed with your visit today? Audrey Richard has provided verbal consent on 12/21/2024 for a virtual visit (video or telephone). Audrey Richard, [place]  Date: 12/21/2024 6:12 PM   Virtual Visit via Video Note   I, Audrey Richard, connected with  Audrey Richard  ([id number], 1961/03/25) on 12/21/2024 at  6:00 PM EST by a video-enabled telemedicine application and verified that I am speaking with the correct person using two identifiers.  Location: Patient: Virtual Visit  Location Patient: Home Provider: Virtual Visit Location Provider: Home Office   I discussed the limitations of evaluation and management by telemedicine and the availability of in person appointments. The patient expressed understanding and agreed to proceed.    History of Present Illness: Audrey Richard is a 63 y.o. who identifies as a female who was assigned female at birth, and is being seen today for a few days of increased congestion and cough with change in sputum production from her baseline (chronic asthmatic bronchitis).  Denies fever or chest pain.  Some mild windedness despite her regular asthma medications.  Denies recent travel or known sick contact.   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   Multiple joint pain 01/04/2024   Chronic kidney disease, stage 3a (HCC) 12/04/2022   Obesity 12/11/2021   Upper airway cough syndrome 08/06/2021   Aortic atherosclerosis 07/13/2021   B12 deficiency 06/18/2021   Vitamin D  deficiency 06/15/2021   Elevated hemoglobin A1c 05/14/2021   Chronic back pain 01/10/2020   Acid reflux 09/29/2019   Primary generalized (osteo)arthritis 09/29/2019   Health care maintenance 09/29/2019   Asthmatic bronchitis , chronic (HCC) 11/15/2018   Allergic rhinitis 12/08/2017   Osteopenia 06/11/2015   Depression 06/11/2015   Insomnia 06/11/2015   Hashimoto's thyroiditis 06/11/2015   Mixed hyperlipidemia 06/11/2015    Allergies: Allergies[1] Medications: Current Medications[2]  Observations/Objective: Patient is well-developed, well-nourished in no  acute distress.  Resting comfortably  at home.  Head is normocephalic, atraumatic.  No labored breathing.  Speech is clear and coherent with logical content.  Patient is alert and oriented at baseline.   Assessment and Plan: 1. Asthmatic bronchitis , chronic (HCC) (Primary) - doxycycline  (VIBRA -TABS) 100 MG  tablet; Take 1 tablet (100 mg total) by mouth 2 (two) times daily.  Dispense: 14 tablet; Refill: 0 - promethazine -dextromethorphan  (PROMETHAZINE -DM) 6.25-15 MG/5ML syrup; Take 5 mLs by mouth 4 (four) times daily as needed for cough.  Dispense: 118 mL; Refill: 0 - predniSONE  (STERAPRED UNI-PAK 21 TAB) 10 MG (21) TBPK tablet; Take following package directions  Dispense: 21 tablet; Refill: 0  Acute on chronic.  Supportive measures and OTC medications reviewed.  Will start doxycycline , Sterapred pack and Promethazine  DM per orders.  Strict in person follow-up precautions reviewed  Follow Up Instructions: I discussed the assessment and treatment plan with the patient. The patient was provided an opportunity to ask questions and all were answered. The patient agreed with the plan and demonstrated an understanding of the instructions.  A copy of instructions were sent to the patient via MyChart unless otherwise noted below.   The patient was advised to call back or seek an in-person evaluation if the symptoms worsen or if the condition fails to improve as anticipated.    Audrey Velma Lunger, PA-C    [1]  Allergies Allergen Reactions   Biaxin [Clarithromycin]     Patient reports itching, nausea   Iodine     Iodine blisters skin  [2]  Current Outpatient Medications:    doxycycline  (VIBRA -TABS) 100 MG tablet, Take 1 tablet (100 mg total) by mouth 2 (two) times daily., Disp: 14 tablet, Rfl: 0   predniSONE  (STERAPRED UNI-PAK 21 TAB) 10 MG (21) TBPK tablet, Take following package directions, Disp: 21 tablet, Rfl: 0   promethazine -dextromethorphan  (PROMETHAZINE -DM) 6.25-15 MG/5ML syrup, Take 5 mLs by mouth 4 (four) times daily as needed for cough., Disp: 118 mL, Rfl: 0   albuterol  (PROVENTIL ) (2.5 MG/3ML) 0.083% nebulizer solution, Inhale 3 mLs (2.5 mg total) into the lungs every 2 (two) hours as needed for shortness of breath or wheezing., Disp: 75 mL, Rfl: 5   albuterol  (VENTOLIN  HFA) 108 (90 Base)  MCG/ACT inhaler, Inhale 2 puffs into the lungs every 6 (six) hours as needed for wheezing or shortness of breath., Disp: 18 g, Rfl: 4   budesonide -glycopyrrolate-formoterol  (BREZTRI  AEROSPHERE) 160-9-4.8 MCG/ACT AERO inhaler, Inhale 2 puffs into the lungs 2 (two) times daily., Disp: 10.7 g, Rfl: 11   buPROPion  (WELLBUTRIN  XL) 300 MG 24 hr tablet, Take 1 tablet (300 mg total) by mouth daily., Disp: 90 tablet, Rfl: 4   cetirizine  (ZYRTEC ) 10 MG tablet, Take 1 tablet by mouth once daily, Disp: 90 tablet, Rfl: 3   Cholecalciferol  (VITAMIN D3) 1.25 MG (50000 UT) CAPS, Take 1 capsule by mouth once a week, Disp: 12 capsule, Rfl: 4   cyclobenzaprine  (FLEXERIL ) 10 MG tablet, TAKE 1 TABLET BY MOUTH AT BEDTIME, Disp: 90 tablet, Rfl: 1   famotidine  (PEPCID ) 20 MG tablet, TAKE 1 TABLET BY MOUTH ONCE DAILY AFTER  SUPPER, Disp: 90 tablet, Rfl: 3   FLUoxetine  (PROZAC ) 20 MG tablet, Take 1 tablet (20 mg total) by mouth daily., Disp: 90 tablet, Rfl: 3   fluticasone  (FLONASE ) 50 MCG/ACT nasal spray, Use 2 spray(s) in each nostril once daily, Disp: 16 g, Rfl: 4   gabapentin  (NEURONTIN ) 300 MG capsule, Take one capsule (300 MG) by mouth in  the morning and at noon, then before bedtime take 2 capsules (600 MG) by mouth., Disp: 360 capsule, Rfl: 4   levothyroxine  (SYNTHROID ) 125 MCG tablet, Take 1 tablet (125 mcg total) by mouth daily., Disp: 90 tablet, Rfl: 3   meloxicam  (MOBIC ) 15 MG tablet, Take 1 tablet (15 mg total) by mouth daily., Disp: 30 tablet, Rfl: 0   montelukast  (SINGULAIR ) 10 MG tablet, Take 1 tablet (10 mg total) by mouth at bedtime., Disp: 90 tablet, Rfl: 3   omeprazole  (PRILOSEC) 40 MG capsule, TAKE ONE CAPSULE BY MOUTH EVERY EVENING, Disp: , Rfl:    rosuvastatin  (CRESTOR ) 20 MG tablet, Take 1 tablet (20 mg total) by mouth daily., Disp: 90 tablet, Rfl: 3   traZODone  (DESYREL ) 50 MG tablet, TAKE 1 TABLET BY MOUTH AT BEDTIME AS NEEDED, Disp: 90 tablet, Rfl: 0   vitamin B-12 (CYANOCOBALAMIN ) 500 MCG tablet,  Take 1 tablet (500 mcg total) by mouth daily., Disp: 90 tablet, Rfl: 4  "

## 2024-12-21 NOTE — Patient Instructions (Signed)
 " Audrey Richard, thank you for joining [name], PA-C for today's virtual visit.  While this provider is not your primary care provider (PCP), if your PCP is located in our provider database this encounter information will be shared with them immediately following your visit.   A [facility] MyChart account gives you access to today's visit and all your visits, tests, and labs performed at [facility]  click here if you don't have a [facility] MyChart account or go to [email or web address]  Consent: (Patient) Audrey Richard provided verbal consent for this virtual visit at the beginning of the encounter.  Current Medications:  Current Outpatient Medications:    albuterol  (PROVENTIL ) (2.5 MG/3ML) 0.083% nebulizer solution, Inhale 3 mLs (2.5 mg total) into the lungs every 2 (two) hours as needed for shortness of breath or wheezing., Disp: 75 mL, Rfl: 5   albuterol  (VENTOLIN  HFA) 108 (90 Base) MCG/ACT inhaler, Inhale 2 puffs into the lungs every 6 (six) hours as needed for wheezing or shortness of breath., Disp: 18 g, Rfl: 4   budesonide -glycopyrrolate-formoterol  (BREZTRI  AEROSPHERE) 160-9-4.8 MCG/ACT AERO inhaler, Inhale 2 puffs into the lungs 2 (two) times daily., Disp: 10.7 g, Rfl: 11   buPROPion  (WELLBUTRIN  XL) 300 MG 24 hr tablet, Take 1 tablet (300 mg total) by mouth daily., Disp: 90 tablet, Rfl: 4   cetirizine  (ZYRTEC ) 10 MG tablet, Take 1 tablet by mouth once daily, Disp: 90 tablet, Rfl: 3   Cholecalciferol  (VITAMIN D3) 1.25 MG (50000 UT) CAPS, Take 1 capsule by mouth once a week, Disp: 12 capsule, Rfl: 4   cyclobenzaprine  (FLEXERIL ) 10 MG tablet, TAKE 1 TABLET BY MOUTH AT BEDTIME, Disp: 90 tablet, Rfl: 1   famotidine  (PEPCID ) 20 MG tablet, TAKE 1 TABLET BY MOUTH ONCE DAILY AFTER  SUPPER, Disp: 90 tablet, Rfl: 3   FLUoxetine  (PROZAC ) 20 MG tablet, Take 1 tablet (20 mg total) by mouth daily., Disp: 90 tablet, Rfl: 3   fluticasone  (FLONASE ) 50 MCG/ACT nasal  spray, Use 2 spray(s) in each nostril once daily, Disp: 16 g, Rfl: 4   gabapentin  (NEURONTIN ) 300 MG capsule, Take one capsule (300 MG) by mouth in the morning and at noon, then before bedtime take 2 capsules (600 MG) by mouth., Disp: 360 capsule, Rfl: 4   levothyroxine  (SYNTHROID ) 125 MCG tablet, Take 1 tablet (125 mcg total) by mouth daily., Disp: 90 tablet, Rfl: 3   meloxicam  (MOBIC ) 15 MG tablet, Take 1 tablet (15 mg total) by mouth daily., Disp: 30 tablet, Rfl: 0   montelukast  (SINGULAIR ) 10 MG tablet, Take 1 tablet (10 mg total) by mouth at bedtime., Disp: 90 tablet, Rfl: 3   omeprazole  (PRILOSEC) 40 MG capsule, TAKE ONE CAPSULE BY MOUTH EVERY EVENING, Disp: , Rfl:    rosuvastatin  (CRESTOR ) 20 MG tablet, Take 1 tablet (20 mg total) by mouth daily., Disp: 90 tablet, Rfl: 3   traZODone  (DESYREL ) 50 MG tablet, TAKE 1 TABLET BY MOUTH AT BEDTIME AS NEEDED, Disp: 90 tablet, Rfl: 0   vitamin B-12 (CYANOCOBALAMIN ) 500 MCG tablet, Take 1 tablet (500 mcg total) by mouth daily., Disp: 90 tablet, Rfl: 4   Medications ordered in this encounter:  No orders of the defined types were placed in this encounter.    *If you need refills on other medications prior to your next appointment, please contact your pharmacy*  Follow-Up: Call back or seek an in-person evaluation if the symptoms worsen or if the condition fails to improve as anticipated.  [facility] [phone]  Other Instructions Please hydrate and rest. Continue your current medications. Take all prescribed medications as directed. If symptoms are not improving over the next couple of days, or you note any worsening of symptoms despite treatment, especially breathing, please seek an in person evaluation ASAP   If you have been instructed to have an in-person evaluation today at a local Urgent Care facility, please use the link below. It will take you to a list of all of our available [facility], including  address, phone number and hours of operation. Please do not delay care.  [facility]  If you or a family member do not have a primary care provider, use the link below to schedule a visit and establish care. When you choose a [facility] primary care physician or advanced practice provider, you gain a long-term partner in health. Find a Primary Care Provider  Learn more about [facility]'s in-office and virtual care options:  - Get Care Now  "

## 2025-01-04 ENCOUNTER — Other Ambulatory Visit: Payer: Self-pay

## 2025-01-04 DIAGNOSIS — F32A Depression, unspecified: Secondary | ICD-10-CM | POA: Diagnosis present

## 2025-01-04 DIAGNOSIS — Z8249 Family history of ischemic heart disease and other diseases of the circulatory system: Secondary | ICD-10-CM

## 2025-01-04 DIAGNOSIS — Z791 Long term (current) use of non-steroidal anti-inflammatories (NSAID): Secondary | ICD-10-CM

## 2025-01-04 DIAGNOSIS — E039 Hypothyroidism, unspecified: Secondary | ICD-10-CM | POA: Diagnosis present

## 2025-01-04 DIAGNOSIS — K529 Noninfective gastroenteritis and colitis, unspecified: Principal | ICD-10-CM | POA: Diagnosis present

## 2025-01-04 DIAGNOSIS — Z79890 Hormone replacement therapy (postmenopausal): Secondary | ICD-10-CM

## 2025-01-04 DIAGNOSIS — Z833 Family history of diabetes mellitus: Secondary | ICD-10-CM

## 2025-01-04 DIAGNOSIS — Z79899 Other long term (current) drug therapy: Secondary | ICD-10-CM

## 2025-01-04 DIAGNOSIS — M4854XS Collapsed vertebra, not elsewhere classified, thoracic region, sequela of fracture: Secondary | ICD-10-CM | POA: Diagnosis present

## 2025-01-04 DIAGNOSIS — J4489 Other specified chronic obstructive pulmonary disease: Secondary | ICD-10-CM | POA: Diagnosis present

## 2025-01-04 DIAGNOSIS — K219 Gastro-esophageal reflux disease without esophagitis: Secondary | ICD-10-CM | POA: Diagnosis present

## 2025-01-04 DIAGNOSIS — I10 Essential (primary) hypertension: Secondary | ICD-10-CM | POA: Diagnosis present

## 2025-01-04 DIAGNOSIS — Z87891 Personal history of nicotine dependence: Secondary | ICD-10-CM

## 2025-01-04 DIAGNOSIS — G8929 Other chronic pain: Secondary | ICD-10-CM | POA: Diagnosis present

## 2025-01-04 DIAGNOSIS — Z1152 Encounter for screening for COVID-19: Secondary | ICD-10-CM

## 2025-01-04 DIAGNOSIS — F419 Anxiety disorder, unspecified: Secondary | ICD-10-CM | POA: Diagnosis present

## 2025-01-04 DIAGNOSIS — Z881 Allergy status to other antibiotic agents status: Secondary | ICD-10-CM

## 2025-01-04 DIAGNOSIS — E782 Mixed hyperlipidemia: Secondary | ICD-10-CM | POA: Diagnosis present

## 2025-01-04 DIAGNOSIS — J9811 Atelectasis: Secondary | ICD-10-CM | POA: Diagnosis present

## 2025-01-04 DIAGNOSIS — M810 Age-related osteoporosis without current pathological fracture: Secondary | ICD-10-CM | POA: Diagnosis present

## 2025-01-04 NOTE — ED Triage Notes (Signed)
 Pt reports lower back pain x3 days. Pt denies injury. Pt denies dysuria.

## 2025-01-05 ENCOUNTER — Encounter: Payer: Self-pay | Admitting: Internal Medicine

## 2025-01-05 ENCOUNTER — Emergency Department

## 2025-01-05 ENCOUNTER — Other Ambulatory Visit: Payer: Self-pay

## 2025-01-05 ENCOUNTER — Inpatient Hospital Stay
Admission: EM | Admit: 2025-01-05 | Discharge: 2025-01-10 | DRG: 392 | Disposition: A | Discharge status: Home Health | Attending: Internal Medicine | Admitting: Internal Medicine

## 2025-01-05 DIAGNOSIS — K219 Gastro-esophageal reflux disease without esophagitis: Secondary | ICD-10-CM | POA: Diagnosis present

## 2025-01-05 DIAGNOSIS — K529 Noninfective gastroenteritis and colitis, unspecified: Secondary | ICD-10-CM | POA: Diagnosis not present

## 2025-01-05 DIAGNOSIS — F32A Depression, unspecified: Secondary | ICD-10-CM | POA: Diagnosis present

## 2025-01-05 DIAGNOSIS — S22080A Wedge compression fracture of T11-T12 vertebra, initial encounter for closed fracture: Secondary | ICD-10-CM

## 2025-01-05 DIAGNOSIS — M549 Dorsalgia, unspecified: Secondary | ICD-10-CM | POA: Diagnosis present

## 2025-01-05 DIAGNOSIS — E039 Hypothyroidism, unspecified: Secondary | ICD-10-CM | POA: Insufficient documentation

## 2025-01-05 DIAGNOSIS — E782 Mixed hyperlipidemia: Secondary | ICD-10-CM | POA: Diagnosis present

## 2025-01-05 DIAGNOSIS — A419 Sepsis, unspecified organism: Secondary | ICD-10-CM

## 2025-01-05 LAB — LACTIC ACID, PLASMA: Lactic Acid, Venous: 0.8 mmol/L (ref 0.5–1.9)

## 2025-01-05 LAB — CBC WITH DIFFERENTIAL/PLATELET
Abs Immature Granulocytes: 0.2 K/uL — ABNORMAL HIGH (ref 0.00–0.07)
Basophils Absolute: 0 K/uL (ref 0.0–0.1)
Basophils Relative: 0 %
Eosinophils Absolute: 0 K/uL (ref 0.0–0.5)
Eosinophils Relative: 0 %
HCT: 43.6 % (ref 36.0–46.0)
Hemoglobin: 14 g/dL (ref 12.0–15.0)
Immature Granulocytes: 1 %
Lymphocytes Relative: 3 %
Lymphs Abs: 0.7 K/uL (ref 0.7–4.0)
MCH: 30.6 pg (ref 26.0–34.0)
MCHC: 32.1 g/dL (ref 30.0–36.0)
MCV: 95.2 fL (ref 80.0–100.0)
Monocytes Absolute: 1.1 K/uL — ABNORMAL HIGH (ref 0.1–1.0)
Monocytes Relative: 5 %
Neutro Abs: 19.9 K/uL — ABNORMAL HIGH (ref 1.7–7.7)
Neutrophils Relative %: 91 %
Platelets: 305 K/uL (ref 150–400)
RBC: 4.58 MIL/uL (ref 3.87–5.11)
RDW: 14.2 % (ref 11.5–15.5)
WBC: 22 K/uL — ABNORMAL HIGH (ref 4.0–10.5)
nRBC: 0 % (ref 0.0–0.2)

## 2025-01-05 LAB — URINALYSIS, ROUTINE W REFLEX MICROSCOPIC
Bilirubin Urine: NEGATIVE
Glucose, UA: NEGATIVE mg/dL
Ketones, ur: 5 mg/dL — AB
Leukocytes,Ua: NEGATIVE
Nitrite: NEGATIVE
Protein, ur: NEGATIVE mg/dL
Specific Gravity, Urine: 1.045 — ABNORMAL HIGH (ref 1.005–1.030)
pH: 5 (ref 5.0–8.0)

## 2025-01-05 LAB — COMPREHENSIVE METABOLIC PANEL WITH GFR
ALT: 15 U/L (ref 0–44)
AST: 18 U/L (ref 15–41)
Albumin: 4 g/dL (ref 3.5–5.0)
Alkaline Phosphatase: 49 U/L (ref 38–126)
Anion gap: 14 (ref 5–15)
BUN: 16 mg/dL (ref 8–23)
CO2: 20 mmol/L — ABNORMAL LOW (ref 22–32)
Calcium: 8.4 mg/dL — ABNORMAL LOW (ref 8.9–10.3)
Chloride: 99 mmol/L (ref 98–111)
Creatinine, Ser: 0.88 mg/dL (ref 0.44–1.00)
GFR, Estimated: >60 mL/min
Glucose, Bld: 88 mg/dL (ref 70–99)
Potassium: 3.7 mmol/L (ref 3.5–5.1)
Sodium: 133 mmol/L — ABNORMAL LOW (ref 135–145)
Total Bilirubin: 0.7 mg/dL (ref 0.0–1.2)
Total Protein: 6.5 g/dL (ref 6.5–8.1)

## 2025-01-05 LAB — LIPASE, BLOOD: Lipase: 12 U/L (ref 11–51)

## 2025-01-05 LAB — RESP PANEL BY RT-PCR (RSV, FLU A&B, COVID)  RVPGX2
Influenza A by PCR: NEGATIVE
Influenza B by PCR: NEGATIVE
Resp Syncytial Virus by PCR: NEGATIVE
SARS Coronavirus 2 by RT PCR: NEGATIVE

## 2025-01-05 LAB — PROCALCITONIN: Procalcitonin: 0.52 ng/mL

## 2025-01-05 LAB — URINALYSIS, MICROSCOPIC (REFLEX): Bacteria, UA: NONE SEEN

## 2025-01-05 MED ORDER — MORPHINE SULFATE (PF) 4 MG/ML IV SOLN
4.0000 mg | Freq: Once | INTRAVENOUS | Status: AC
  Administered 2025-01-05: 4 mg via INTRAVENOUS
  Filled 2025-01-05: qty 1

## 2025-01-05 MED ORDER — TRAZODONE HCL 50 MG PO TABS
50.0000 mg | ORAL_TABLET | Freq: Every evening | ORAL | Status: DC | PRN
  Administered 2025-01-07 – 2025-01-08 (×2): 50 mg via ORAL
  Filled 2025-01-05 (×2): qty 1

## 2025-01-05 MED ORDER — LEVOTHYROXINE SODIUM 50 MCG PO TABS
125.0000 ug | ORAL_TABLET | Freq: Every day | ORAL | Status: DC
  Administered 2025-01-05 – 2025-01-10 (×6): 125 ug via ORAL
  Filled 2025-01-05 (×6): qty 1

## 2025-01-05 MED ORDER — SUCRALFATE 1 GM/10ML PO SUSP
1.0000 g | Freq: Three times a day (TID) | ORAL | Status: DC
  Administered 2025-01-05 – 2025-01-10 (×21): 1 g via ORAL
  Filled 2025-01-05 (×22): qty 10

## 2025-01-05 MED ORDER — ONDANSETRON HCL 4 MG/2ML IJ SOLN
4.0000 mg | Freq: Three times a day (TID) | INTRAMUSCULAR | Status: DC | PRN
  Administered 2025-01-09: 4 mg via INTRAVENOUS
  Filled 2025-01-05: qty 2

## 2025-01-05 MED ORDER — VANCOMYCIN HCL IN DEXTROSE 1-5 GM/200ML-% IV SOLN
1000.0000 mg | Freq: Two times a day (BID) | INTRAVENOUS | Status: DC
  Administered 2025-01-05 – 2025-01-06 (×2): 1000 mg via INTRAVENOUS
  Filled 2025-01-05 (×2): qty 200

## 2025-01-05 MED ORDER — ROSUVASTATIN CALCIUM 10 MG PO TABS
20.0000 mg | ORAL_TABLET | Freq: Every day | ORAL | Status: DC
  Administered 2025-01-05 – 2025-01-10 (×6): 20 mg via ORAL
  Filled 2025-01-05 (×6): qty 2

## 2025-01-05 MED ORDER — ACETAMINOPHEN 650 MG RE SUPP: 650.0000 mg | Freq: Four times a day (QID) | RECTAL | Status: DC | PRN

## 2025-01-05 MED ORDER — LACTATED RINGERS IV SOLN: INTRAVENOUS | Status: AC

## 2025-01-05 MED ORDER — PANTOPRAZOLE SODIUM 40 MG IV SOLR
40.0000 mg | Freq: Two times a day (BID) | INTRAVENOUS | Status: DC
  Administered 2025-01-05 – 2025-01-10 (×11): 40 mg via INTRAVENOUS
  Filled 2025-01-05 (×11): qty 10

## 2025-01-05 MED ORDER — HYDROMORPHONE HCL 1 MG/ML IJ SOLN
0.5000 mg | INTRAMUSCULAR | Status: AC | PRN
  Administered 2025-01-05 (×2): 0.5 mg via INTRAVENOUS
  Filled 2025-01-05 (×2): qty 0.5

## 2025-01-05 MED ORDER — GABAPENTIN 300 MG PO CAPS
600.0000 mg | ORAL_CAPSULE | Freq: Every day | ORAL | Status: DC
  Administered 2025-01-05 – 2025-01-09 (×5): 600 mg via ORAL
  Filled 2025-01-05 (×5): qty 2

## 2025-01-05 MED ORDER — FLUOXETINE HCL 20 MG PO CAPS
20.0000 mg | ORAL_CAPSULE | Freq: Every day | ORAL | Status: DC
  Administered 2025-01-05 – 2025-01-10 (×6): 20 mg via ORAL
  Filled 2025-01-05 (×6): qty 1

## 2025-01-05 MED ORDER — ONDANSETRON HCL 4 MG/2ML IJ SOLN
4.0000 mg | Freq: Once | INTRAMUSCULAR | Status: AC
  Administered 2025-01-05: 4 mg via INTRAVENOUS
  Filled 2025-01-05: qty 2

## 2025-01-05 MED ORDER — VANCOMYCIN HCL 2000 MG/400ML IV SOLN
2000.0000 mg | INTRAVENOUS | Status: AC
  Administered 2025-01-05: 2000 mg via INTRAVENOUS
  Filled 2025-01-05: qty 400

## 2025-01-05 MED ORDER — ALUM & MAG HYDROXIDE-SIMETH 200-200-20 MG/5ML PO SUSP: 30.0000 mL | Freq: Four times a day (QID) | ORAL | Status: DC | PRN

## 2025-01-05 MED ORDER — GABAPENTIN 300 MG PO CAPS
300.0000 mg | ORAL_CAPSULE | Freq: Two times a day (BID) | ORAL | Status: DC
  Administered 2025-01-05 – 2025-01-10 (×10): 300 mg via ORAL
  Filled 2025-01-05 (×10): qty 1

## 2025-01-05 MED ORDER — PIPERACILLIN-TAZOBACTAM 3.375 G IVPB 30 MIN
3.3750 g | Freq: Four times a day (QID) | INTRAVENOUS | Status: DC
  Administered 2025-01-05 – 2025-01-06 (×5): 3.375 g via INTRAVENOUS
  Filled 2025-01-05 (×5): qty 50

## 2025-01-05 MED ORDER — ACETAMINOPHEN 325 MG PO TABS
650.0000 mg | ORAL_TABLET | Freq: Four times a day (QID) | ORAL | Status: DC | PRN
  Administered 2025-01-06 – 2025-01-09 (×4): 650 mg via ORAL
  Filled 2025-01-05 (×4): qty 2

## 2025-01-05 MED ORDER — ONDANSETRON HCL 4 MG/2ML IJ SOLN: 4.0000 mg | Freq: Three times a day (TID) | INTRAMUSCULAR | Status: AC | PRN

## 2025-01-05 MED ORDER — GABAPENTIN 300 MG PO CAPS: 300.0000 mg | ORAL_CAPSULE | Freq: Three times a day (TID) | ORAL | Status: DC

## 2025-01-05 MED ORDER — PIPERACILLIN-TAZOBACTAM 3.375 G IVPB 30 MIN
3.3750 g | Freq: Once | INTRAVENOUS | Status: AC
  Administered 2025-01-05: 3.375 g via INTRAVENOUS
  Filled 2025-01-05: qty 50

## 2025-01-05 MED ORDER — HYDROMORPHONE HCL 1 MG/ML IJ SOLN
0.5000 mg | INTRAMUSCULAR | Status: DC | PRN
  Administered 2025-01-05 – 2025-01-06 (×2): 0.5 mg via INTRAVENOUS
  Filled 2025-01-05 (×2): qty 0.5

## 2025-01-05 MED ORDER — IOHEXOL 300 MG/ML  SOLN
100.0000 mL | Freq: Once | INTRAMUSCULAR | Status: AC | PRN
  Administered 2025-01-05: 100 mL via INTRAVENOUS

## 2025-01-05 NOTE — Consult Note (Signed)
 Pharmacy Antibiotic Note  Audrey Richard is a 64 y.o. female admitted on 01/05/2025 with concerns for sepsis secondary to suspected colitis . Pharmacy has been consulted for vancomycin  dosing.   Past medical history is notable for  asthma, anxiety/depression, hypothyroidism, HTN, and HLD. On 12/31, was prescribed doxycycline  x7 days for acute on chronic bronchitis concerns. The past few days patient has complained of watery diarrhea - does not endorse fever, chills, nausea/vomiting, dysuria.  On arrival to ED, patient afebrile, WBC 22, with pulse wnl and room air. 1/15 CT concerning for colitis..numerous sigmoid diverticula without definite evidence of diverticulitis. GI panel and C diff screen have been ordered. Code sepsis called, and patient given piperacillin /tazobactam 3.375 g IV x1.  1/15 Renal - Scr 0.88 (at baseline). Afebrile, WBC 22 1/15 bcx: in process 1/15 GI PCR: ordered 1/15 Cdiff screen: ordered\  Vancomycin  1000 mg IV 12h Goal AUC 400-550 cAUC 509.4, Cmin 15.6, Scr used 0.88, Vd 0.72 L/kg  Plan: Vancomycin  Ordered vancomycin  2g IV LD Will schedule 1000 mg IV q12h Plan get vancomycin  levels after 4th or 5th dose Continue to monitor renal function and cultures for adjustments  Height: 5' 7 (170.2 cm) Weight: 95.3 kg (210 lb) IBW/kg (Calculated) : 61.6  Temp (24hrs), Avg:98.4 F (36.9 C), Min:98.3 F (36.8 C), Max:98.7 F (37.1 C)  Recent Labs  Lab 01/05/25 0315 01/05/25 0524  WBC 22.0*  --   CREATININE 0.88  --   LATICACIDVEN  --  0.8    Estimated Creatinine Clearance: 77.6 mL/min (by C-G formula based on SCr of 0.88 mg/dL).    Allergies[1]  Antimicrobials this admission: Zosyn  1/15 1/15 x1 vancomycin  1/15 >>    Thank you for allowing pharmacy to be a part of this patients care.  Audrey Richard Audrey Richard 01/05/2025 11:10 AM     [1]  Allergies Allergen Reactions   Betadine [Povidone Iodine] Other (See Comments)    Blisters on skin   Biaxin  [Clarithromycin]     Patient reports itching, nausea

## 2025-01-05 NOTE — Sepsis Progress Note (Signed)
 Elink monitoring for the code sepsis protocol.

## 2025-01-05 NOTE — Evaluation (Signed)
 Physical Therapy Evaluation Patient Details Name: Audrey Richard MRN: [id number] DOB: 08-Nov-1961 Today's Date: 01/05/2025  History of Present Illness  Audrey Richard is a 64 y.o. female admitted on 01/05/2025 with concerns for sepsis secondary to suspected colitis  Clinical Impression  Patient noted to be in seatet position at PT arrival in room, for an initial PT evaluation due to a decline in functional status, with baseline mobility reported as independent, and currently requiring minA up to modI for transfers and short gait. The patient is A&O x 4, presenting with good willingness to work with PT. The patient resides in a apartment and lives with daughter with intermittent family/friend support. There are no STE inside the residence but lives on the second level of the apartment 15 steps to main living area per son report in room. Long gait assessment held due to increased pain; short gait indicates minimal strength necessary of standing balance and ambulation. The overall clinical impression is that the patient presents with mild to moderate mobility limitations mostly due to pain at this time. Recommended skilled PT will address safety, mobility, and discharge planning.        If plan is discharge home, recommend the following: A little help with walking and/or transfers;A little help with bathing/dressing/bathroom;Help with stairs or ramp for entrance;Assist for transportation   Can travel by private vehicle        Equipment Recommendations Rolling walker (2 wheels)  Recommendations for Other Services       Functional Status Assessment Patient has had a recent decline in their functional status and demonstrates the ability to make significant improvements in function in a reasonable and predictable amount of time.     Precautions / Restrictions Restrictions Weight Bearing Restrictions Per Provider Order: No      Mobility  Bed Mobility Overal bed mobility: Needs  Assistance Bed Mobility: Supine to Sit, Sit to Supine     Supine to sit: Contact guard Sit to supine: Contact guard assist   General bed mobility comments: increased time and needed assistance mainly due to pain    Transfers Overall transfer level: Needs assistance Equipment used: 1 person hand held assist Transfers: Sit to/from Stand, Bed to chair/wheelchair/BSC Sit to Stand: Supervision   Step pivot transfers: Min assist, +2 safety/equipment       General transfer comment: son on stand by for safety    Ambulation/Gait Ambulation/Gait assistance: Supervision, Modified independent (Device/Increase time) Gait Distance (Feet): 5 Feet Assistive device: 1 person hand held assist         General Gait Details: able to take lateral steps at bedside; limited mainly due to pain  Stairs            Wheelchair Mobility     Tilt Bed    Modified Rankin (Stroke Patients Only)       Balance Overall balance assessment: Needs assistance Sitting-balance support: Feet supported Sitting balance-Leahy Scale: Good     Standing balance support: During functional activity Standing balance-Leahy Scale: Fair Standing balance comment: impacted primarily due to pain                             Pertinent Vitals/Pain Pain Assessment Pain Assessment: Faces Faces Pain Scale: Hurts whole lot Pain Location: abdomen and lower back Pain Descriptors / Indicators: Aching, Throbbing, Constant Pain Intervention(s): Monitored during session, Limited activity within patient's tolerance    Home Living Family/patient expects to be discharged to::  Private residence Living Arrangements: Children Available Help at Discharge: Family;Available PRN/intermittently (lives with daugther who is there intermittently that can provided support) Type of Home: Apartment Home Access: Level entry     Alternate Level Stairs-Number of Steps: 15 Home Layout: Two level Home Equipment:  None Additional Comments: per son in room    Prior Function Prior Level of Function : Independent/Modified Independent             Mobility Comments: pt. independent at baseline for mobility ADLs Comments: ind. for ADLs     Extremity/Trunk Assessment   Upper Extremity Assessment Upper Extremity Assessment: Overall WFL for tasks assessed    Lower Extremity Assessment Lower Extremity Assessment: Generalized weakness    Cervical / Trunk Assessment Cervical / Trunk Assessment: Normal  Communication   Communication Communication: No apparent difficulties    Cognition Arousal: Alert Behavior During Therapy: WFL for tasks assessed/performed   PT - Cognitive impairments: No apparent impairments                         Following commands: Intact       Cueing Cueing Techniques: Verbal cues     General Comments      Exercises     Assessment/Plan    PT Assessment Patient needs continued PT services  PT Problem List Decreased activity tolerance;Decreased mobility;Decreased strength       PT Treatment Interventions Stair training;Functional mobility training;Therapeutic activities;Therapeutic exercise;Balance training;Gait training;Neuromuscular re-education;Patient/family education    PT Goals (Current goals can be found in the Care Plan section)  Acute Rehab PT Goals PT Goal Formulation: Patient unable to participate in goal setting    Frequency Min 2X/week     Co-evaluation               AM-PAC PT 6 Clicks Mobility  Outcome Measure Help needed turning from your back to your side while in a flat bed without using bedrails?: A Little Help needed moving from lying on your back to sitting on the side of a flat bed without using bedrails?: A Little Help needed moving to and from a bed to a chair (including a wheelchair)?: A Little Help needed standing up from a chair using your arms (e.g., wheelchair or bedside chair)?: A Little Help needed  to walk in hospital room?: A Lot Help needed climbing 3-5 steps with a railing? : A Lot 6 Click Score: 16    End of Session   Activity Tolerance: Patient limited by pain Patient left: in bed;with call bell/phone within reach;with family/visitor present Nurse Communication: Mobility status PT Visit Diagnosis: Other abnormalities of gait and mobility (R26.89);Difficulty in walking, not elsewhere classified (R26.2);Muscle weakness (generalized) (M62.81)    Time: 8687-8663 PT Time Calculation (min) (ACUTE ONLY): 24 min   Charges:   PT Evaluation $PT Eval Low Complexity: 1 Low   PT General Charges $$ ACUTE PT VISIT: 1 Visit         Sherlean Lesches DPT, PT    Itza Maniaci A Shebra Muldrow 01/05/2025, 1:37 PM

## 2025-01-05 NOTE — H&P (Signed)
 "  History and Physical    Audrey Richard FMW:[id number] DOB: [birth year] DOA: 01/05/2025  DOS: the patient was seen and examined on 01/05/2025  PCP: Valerio Melanie DASEN, NP   Patient coming from: Home  I have personally briefly reviewed patient's old medical records in [facility]  Chief Complaint: Back pain for 3 days  HPI: Audrey Richard is a pleasant 64 y.o. female with medical history significant for asthma, anxiety/depression, hypothyroidism, HTN, HLD who presented to ED with 2 weeks of back pain and abdominal pain worsening for the last 3 days.  Patient stated that pain is severe in the lower back 8/10 in intensity, worsening with movement, nonradiating, associated with diffuse abdominal discomfort.   Patient is a poor historian and her story does not appear to be very cohesive.  Patient's daughter was at bedside she helped with the history. Patient stated that she was given doxycycline  2 weeks ago for upper respiratory symptoms.  Now she complains that she had watery diarrhea.  Denies any fever chills, nausea, vomiting, dysuria, hematuria, vaginal bleeding.  She stated that she had back injury in August 2025 and she had tailbone fracture.  She denies any fall at this point.   ED Course: Upon arrival to the ED, patient is found to be leukocytosis at 22, bicarb of 20, sodium 133, CT chest abdomen pelvis showed no acute finding.  There is nonspecific colitis, possible patchy infiltrate in the lungs, sigmoid diverticula without diverticulitis mild sliding hiatal hernia.  Chronic mild T12 compression deformity with mild retropulsion.  Patient was given pain medications and hospitalist service was consulted for evaluation for admission for pain control.  Review of Systems:  ROS  All other systems negative except as noted in the HPI.  Past Medical History:  Diagnosis Date   Asthma    COPD (chronic obstructive pulmonary disease) (HCC)    Depression    High cholesterol    Osteoporosis     Throat ulcer    Thyroid  disease     Past Surgical History:  Procedure Laterality Date   TUBAL LIGATION       reports that she quit smoking about 19 years ago. Her smoking use included cigarettes. She has never used smokeless tobacco. She reports that she does not currently use alcohol. She reports that she does not use drugs.  Allergies[1]  Family History  Problem Relation Age of Onset   Heart disease Mother    Heart disease Father    Lung disease Father    Diabetes Sister    Diabetes Maternal Grandmother    Heart disease Maternal Grandmother    Diabetes Maternal Grandfather    Heart disease Maternal Grandfather    Diabetes Sister     Prior to Admission medications  Medication Sig Start Date End Date Taking? Authorizing Provider  albuterol  (VENTOLIN  HFA) 108 (90 Base) MCG/ACT inhaler Inhale 2 puffs into the lungs every 6 (six) hours as needed for wheezing or shortness of breath. 05/18/24  Yes Cannady, Jolene T, NP  budesonide -glycopyrrolate-formoterol  (BREZTRI  AEROSPHERE) 160-9-4.8 MCG/ACT AERO inhaler Inhale 2 puffs into the lungs 2 (two) times daily. 05/18/24  Yes Cannady, Jolene T, NP  buPROPion  (WELLBUTRIN  XL) 300 MG 24 hr tablet Take 1 tablet (300 mg total) by mouth daily. 11/09/23  Yes Cannady, Jolene T, NP  cetirizine  (ZYRTEC ) 10 MG tablet Take 1 tablet by mouth once daily 08/05/24  Yes Cannady, Jolene T, NP  Cholecalciferol  (VITAMIN D3) 1.25 MG (50000 UT) CAPS Take 1  capsule by mouth once a week 01/29/24  Yes Cannady, Jolene T, NP  cyclobenzaprine  (FLEXERIL ) 10 MG tablet TAKE 1 TABLET BY MOUTH AT BEDTIME 09/15/24  Yes Cannady, Jolene T, NP  famotidine  (PEPCID ) 20 MG tablet TAKE 1 TABLET BY MOUTH ONCE DAILY AFTER  SUPPER 05/18/24  Yes Cannady, Jolene T, NP  FLUoxetine  (PROZAC ) 20 MG tablet Take 1 tablet (20 mg total) by mouth daily. 05/18/24  Yes Cannady, Jolene T, NP  fluticasone  (FLONASE ) 50 MCG/ACT nasal spray Use 2 spray(s) in each nostril once daily 05/18/24  Yes Cannady,  Jolene T, NP  gabapentin  (NEURONTIN ) 300 MG capsule Take one capsule (300 MG) by mouth in the morning and at noon, then before bedtime take 2 capsules (600 MG) by mouth. 05/18/24  Yes Cannady, Jolene T, NP  levothyroxine  (SYNTHROID ) 125 MCG tablet Take 1 tablet (125 mcg total) by mouth daily. 05/18/24  Yes Cannady, Jolene T, NP  montelukast  (SINGULAIR ) 10 MG tablet Take 1 tablet (10 mg total) by mouth at bedtime. 05/18/24  Yes Cannady, Jolene T, NP  promethazine -dextromethorphan  (PROMETHAZINE -DM) 6.25-15 MG/5ML syrup Take 5 mLs by mouth 4 (four) times daily as needed for cough. 12/21/24  Yes Gladis Elsie BROCKS, PA-C  rosuvastatin  (CRESTOR ) 20 MG tablet Take 1 tablet (20 mg total) by mouth daily. 05/18/24  Yes Cannady, Jolene T, NP  traZODone  (DESYREL ) 50 MG tablet TAKE 1 TABLET BY MOUTH AT BEDTIME AS NEEDED 09/06/24  Yes Cannady, Jolene T, NP  vitamin B-12 (CYANOCOBALAMIN ) 500 MCG tablet Take 1 tablet (500 mcg total) by mouth daily. 01/05/23  Yes Cannady, Jolene T, NP  albuterol  (PROVENTIL ) (2.5 MG/3ML) 0.083% nebulizer solution Inhale 3 mLs (2.5 mg total) into the lungs every 2 (two) hours as needed for shortness of breath or wheezing. 01/05/23 01/05/24  Cannady, Jolene T, NP  doxycycline  (VIBRA -TABS) 100 MG tablet Take 1 tablet (100 mg total) by mouth 2 (two) times daily. Patient not taking: Reported on 01/05/2025 12/21/24   Gladis Elsie BROCKS, PA-C  meloxicam  (MOBIC ) 15 MG tablet Take 1 tablet (15 mg total) by mouth daily. Patient not taking: Reported on 01/05/2025 10/22/23   Tobie Franky SQUIBB, DPM  omeprazole  (PRILOSEC) 40 MG capsule TAKE ONE CAPSULE BY MOUTH EVERY EVENING 07/18/21   Darlean Ozell NOVAK, MD  predniSONE  (STERAPRED UNI-PAK 21 TAB) 10 MG (21) TBPK tablet Take following package directions Patient not taking: Reported on 01/05/2025 12/21/24   Gladis Elsie BROCKS, [place]    Physical Exam: Vitals:   01/05/25 0558 01/05/25 0721 01/05/25 0730 01/05/25 1113  BP: 107/77  101/71 105/66  Pulse: 95  92 76   Resp: 20  18 18   Temp: 98.3 F (36.8 C)  98.3 F (36.8 C) 98.5 F (36.9 C)  TempSrc: Oral  Oral Oral  SpO2: 97% 97% 91% 95%  Weight:      Height:        Physical Exam   Constitutional: Alert, awake, calm, comfortable HEENT: Neck supple Respiratory: Clear to auscultation B/L, no wheezing, no rales.  Cardiovascular: Regular rate and rhythm, no murmurs / rubs / gallops. No extremity edema. 2+ pedal pulses. No carotid bruits.  Abdomen: Soft, no tenderness, Bowel sounds positive.  Diffuse nonspecific tenderness on throughout the abdomen Musculoskeletal: no clubbing / cyanosis. Good ROM, no contractures. Normal muscle tone.  Skin: no rashes, lesions, ulcers. Neurologic: CN 2-12 grossly intact. Sensation intact, No focal deficit identified Psychiatric: Alert and oriented x 3. Normal mood.    Labs on Admission: I have personally reviewed  following labs and imaging studies  CBC: Recent Labs  Lab 01/05/25 0315  WBC 22.0*  NEUTROABS 19.9*  HGB 14.0  HCT 43.6  MCV 95.2  PLT 305   Basic Metabolic Panel: Recent Labs  Lab 01/05/25 0315  NA 133*  K 3.7  CL 99  CO2 20*  GLUCOSE 88  BUN 16  CREATININE 0.88  CALCIUM  8.4*   GFR: Estimated Creatinine Clearance: 77.6 mL/min (by C-G formula based on SCr of 0.88 mg/dL). Liver Function Tests: Recent Labs  Lab 01/05/25 0315  AST 18  ALT 15  ALKPHOS 49  BILITOT 0.7  PROT 6.5  ALBUMIN 4.0   Recent Labs  Lab 01/05/25 0315  LIPASE 12   No results for input(s): AMMONIA in the last 168 hours. Coagulation Profile: No results for input(s): INR, PROTIME in the last 168 hours. Cardiac Enzymes: No results for input(s): CKTOTAL, CKMB, CKMBINDEX, TROPONINI, TROPONINIHS in the last 168 hours. BNP (last 3 results) No results for input(s): BNP in the last 8760 hours. HbA1C: No results for input(s): HGBA1C in the last 72 hours. CBG: No results for input(s): GLUCAP in the last 168 hours. Lipid Profile: No  results for input(s): CHOL, HDL, LDLCALC, TRIG, CHOLHDL, LDLDIRECT in the last 72 hours. Thyroid  Function Tests: No results for input(s): TSH, T4TOTAL, FREET4, T3FREE, THYROIDAB in the last 72 hours. Anemia Panel: No results for input(s): VITAMINB12, FOLATE, FERRITIN, TIBC, IRON, RETICCTPCT in the last 72 hours. Urine analysis:    Component Value Date/Time   COLORURINE STRAW (A) 01/05/2025 0542   APPEARANCEUR CLEAR 01/05/2025 0542   LABSPEC 1.045 (H) 01/05/2025 0542   PHURINE 5.0 01/05/2025 0542   GLUCOSEU NEGATIVE 01/05/2025 0542   HGBUR MODERATE (A) 01/05/2025 0542   BILIRUBINUR NEGATIVE 01/05/2025 0542   KETONESUR 5 (A) 01/05/2025 0542   PROTEINUR NEGATIVE 01/05/2025 0542   NITRITE NEGATIVE 01/05/2025 0542   LEUKOCYTESUR NEGATIVE 01/05/2025 0542    Radiological Exams on Admission: I have personally reviewed images CT L-SPINE NO CHARGE Result Date: 01/05/2025 EXAM: CT OF THE LUMBAR SPINE WITHOUT CONTRAST 01/05/2025 04:26:23 AM TECHNIQUE: CT of the lumbar spine was performed without the administration of intravenous contrast. Multiplanar reformatted images are provided for review. Automated exposure control, iterative reconstruction, and/or weight based adjustment of the mA/kV was utilized to reduce the radiation dose to as low as reasonably achievable. COMPARISON: None available. CLINICAL HISTORY: FINDINGS: BONES AND ALIGNMENT: The lumbar vertebrae maintain their height and alignment. There is a chronic compression deformity of the superior aspect of the T12 vertebral body with mild retropulsion of the posterior superior corner. No acute fracture or suspicious bone lesion. DEGENERATIVE CHANGES: No significant degenerative changes. 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 No acute abnormality. IMPRESSION: 1. Chronic compression deformity of the superior aspect of the T12 vertebral body with mild retropulsion of the posterior superior corner.  2. Prominent sacral Tarlov cysts. Electronically signed by: Evalene Coho MD 01/05/2025 05:18 AM EST RP Workstation: [id number]   CT CHEST ABDOMEN PELVIS W CONTRAST Result Date: 01/05/2025 EXAM: CT CHEST, ABDOMEN AND PELVIS WITH CONTRAST 01/05/2025 04:26:23 AM TECHNIQUE: CT of the chest, abdomen and pelvis was performed with the administration of 100 mL of iohexol  (OMNIPAQUE ) 300 MG/ML solution. Multiplanar reformatted images are provided for review. Automated exposure control, iterative reconstruction, and/or weight based adjustment of the mA/kV was utilized to reduce the radiation dose to as low as reasonably achievable. COMPARISON: CT of the abdomen and pelvis dated 12/30/2020. CLINICAL HISTORY: trauma trauma trauma FINDINGS:  CHEST: MEDIASTINUM AND LYMPH NODES: Heart and pericardium are unremarkable. The central airways are clear. No mediastinal, hilar or axillary lymphadenopathy. LUNGS AND PLEURA: There is a patchy, streaky opacity present posterolaterally within the base of the lingula. There are few reticular opacities also present within the periphery of the lower lobes bilaterally. No pleural effusion. No pneumothorax. ABDOMEN AND PELVIS: LIVER: Unremarkable. GALLBLADDER AND BILE DUCTS: Unremarkable. No biliary ductal dilatation. SPLEEN: No acute abnormality. PANCREAS: No acute abnormality. ADRENAL GLANDS: No acute abnormality. KIDNEYS, URETERS AND BLADDER: There are small simple cysts arising from the lower poles of the kidneys. Per consensus, no follow-up is needed for simple Bosniak type 1 and 2 renal cysts, unless the patient has a malignancy history or risk factors. No stones in the kidneys or ureters. No hydronephrosis. No perinephric or periureteral stranding. Urinary bladder is unremarkable. GI AND BOWEL: Stomach demonstrates no acute abnormality. There is a mild sliding hiatus hernia. There is abnormal thickening of the wall of the descending colon and there is stranding of the adjacent fat,  concerning for colitis. There are numerous sigmoid diverticula present, but no definite evidence of diverticulitis. There is no bowel obstruction. REPRODUCTIVE ORGANS: No acute abnormality. PERITONEUM AND RETROPERITONEUM: There is a small periumbilical fat-containing hernia. There is trace free fluid within the pelvis. No free air. VASCULATURE: Aorta is normal in caliber and demonstrates mild-to-moderate calcific atheromatous disease. ABDOMINAL AND PELVIS LYMPH NODES: No lymphadenopathy. REPRODUCTIVE ORGANS: No acute abnormality. BONES AND SOFT TISSUES: There is a mild chronic compression deformity of T12 with mild retropulsion of the posterior superior corner of the vertebral body. 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 There are prominent sacral Tarlov cysts present bilaterally. There is no evidence of acute bony injury. No focal soft tissue abnormality. IMPRESSION: 1. No evidence of acute bony injury. Chronic mild T12 compression deformity with mild retropulsion. 2. Abnormal thickening of the wall of the descending colon with adjacent fat stranding, concerning for colitis. 3. Patchy streaky opacity at the posterolateral lingular base and mild peripheral reticular opacities in the lower lobes bilaterally, likely atelectasis/scarring. 4. Numerous sigmoid diverticula without definite evidence of diverticulitis. 5. Mild sliding hiatus hernia and small periumbilical fat-containing hernia. Trace pelvic free fluid. Electronically signed by: Evalene Coho MD 01/05/2025 05:17 AM EST RP Workstation: [id number]   CT T-SPINE NO CHARGE Result Date: 01/05/2025 EXAM: CT THORACIC SPINE WITHOUT CONTRAST 01/05/2025 04:26:23 AM TECHNIQUE: CT of the thoracic spine was performed without the administration of intravenous contrast. Multiplanar reformatted images are provided for review. Automated exposure control, iterative reconstruction, and/or weight based adjustment of the mA/kV  was utilized to reduce the radiation dose to as low as reasonably achievable. COMPARISON: Thoracic spine radiograph 11/23/2019 and chest radiograph 12/04/2022. CLINICAL HISTORY: FINDINGS: BONES AND ALIGNMENT: Mild thoracic kyphosis. New superior endplate deformity is identified involving the T12 vertebra with loss of approximately 20% of the vertebral body height. There is 4 mm retropulsion of fracture fragments off the posterior aspect of the superior endplate. This represents an acute fracture. The remaining vertebral body heights are well preserved without signs of additional fracture or subluxation. No suspicious bone lesion. DEGENERATIVE CHANGES: Disc space narrowing and endplate degenerative changes noted at T7 through T12. SOFT TISSUES: No acute abnormality. IMPRESSION: 1. T12 superior endplate compression fracture with approximately 20% height loss and 4 mm retropulsion. Electronically signed by: Waddell Calk MD 01/05/2025 05:13 AM EST RP Workstation: [id number]    EKG: My personal interpretation of EKG shows: Sinus  tachycardia at 103 bpm, no ST elevation    Assessment/Plan Principal Problem:   Acute colitis Active Problems:   Depression   Mixed hyperlipidemia   GERD (gastroesophageal reflux disease)   Back pain   Hypothyroidism    Assessment and Plan: This is a 64 year old female with multiple medical problems including but not limited by pain, hypothyroidism, anxiety/depression, hyperlipidemia, GERD with recent use of doxycycline  for upper respiratory symptoms who came into ED.   1.  Intractable back and abdominal pain/colitis - Her CT scan of chest abdomen and pelvis did not show any acute obstruction or fracture. - It shows some possible colitis and diverticulosis. - She will be placed in observation. - She was started on Zosyn  in the emergency room.  She has a leukocytosis. - I will continue on vancomycin  and Zosyn  until we have cultures result. - Urine analysis did not show  obvious infection. - She will be given pain medication - She will be given clear liquid diet for now - Will give her PT/OT.  2.  Acute on chronic back pain. - Patient stated that she had fallen in August 2025. - She had tailbone fracture. - She has old T12 compression fracture. - She will be given pain medications and PT/OT.  3.  Recent use of doxycycline  now has a diarrhea. - CT scan showed possible colitis. - Patient complained that she had watery diarrhea for few days. - I will check C. difficile colitis and GI panel. - I will continue her on IV fluid - Follow-up the results.  4.  Anxiety/depression - Resume her home medications for anxiety and depression fluoxetine , trazodone   5.  Hypothyroidism - Resume her levothyroxine   6.  HLD - Continue rosuvastatin   7.  GERD - Patient has a history of doxycycline  use sometimes it causes worsening reflux symptoms. - She will be given Protonix  IV along with Maalox and Carafate .      DVT prophylaxis: Lovenox  Code Status: Full Code Family Communication: Daughter present at the bedside Disposition Plan: Home Consults called: None Admission status: Observation, Med-Surg   Nena Rebel, MD [facility] 01/05/2025, 11:13 AM       [1]  Allergies Allergen Reactions   Betadine [Povidone Iodine] Other (See Comments)    Blisters on skin   Biaxin [Clarithromycin]     Patient reports itching, nausea   "

## 2025-01-05 NOTE — Progress Notes (Signed)
 CODE SEPSIS - PHARMACY COMMUNICATION  **[facility] Spectrum Antibiotics should be administered within 1 hour of Sepsis diagnosis**  Time Code Sepsis Called/Page Received: 0525  Antibiotics Ordered: Zosyn   Time of 1st antibiotic administration: 0544  Additional action taken by pharmacy: None  If necessary, Name of Provider/Nurse Contacted: None    Damien Napoleon ,PharmD Clinical Pharmacist  01/05/2025  5:26 AM

## 2025-01-05 NOTE — ED Provider Notes (Signed)
 "  [facility] Provider Note    Event Date/Time   First MD Initiated Contact with Patient 01/05/25 [phone]     (approximate)   History   Back Pain   HPI  Audrey Richard is a 64 y.o. female with history of asthma, COPD, hyperlipidemia, hypothyroidism who presents to the emergency department with 2 weeks of back pain and now several days of abdominal pain.  Now having diarrhea.  No fevers, vomiting, dysuria or hematuria, vaginal bleeding or discharge.  She does report she had a fall and injured her back but had negative outpatient x-rays.  No injury since then.  States the fall was in August.  She denies numbness, tingling, weakness currently.  She states she does have intermittent numbness in the left leg but none now.  Denies any bowel or bladder incontinence.  She is complaining of some mid back pain that radiates into her ribs.  Patient is a very difficult historian.  She initially tells me that she fell 2 weeks ago but then tells nursing staff that she fell in August.   History provided by patient and daughter.    Past Medical History:  Diagnosis Date   Asthma    COPD (chronic obstructive pulmonary disease) (HCC)    Depression    High cholesterol    Osteoporosis    Throat ulcer    Thyroid  disease     Past Surgical History:  Procedure Laterality Date   TUBAL LIGATION      MEDICATIONS:  Prior to Admission medications  Medication Sig Start Date End Date Taking? Authorizing Provider  albuterol  (PROVENTIL ) (2.5 MG/3ML) 0.083% nebulizer solution Inhale 3 mLs (2.5 mg total) into the lungs every 2 (two) hours as needed for shortness of breath or wheezing. 01/05/23 01/05/24  Cannady, Jolene T, NP  albuterol  (VENTOLIN  HFA) 108 (90 Base) MCG/ACT inhaler Inhale 2 puffs into the lungs every 6 (six) hours as needed for wheezing or shortness of breath. 05/18/24   Cannady, Jolene T, NP  budesonide -glycopyrrolate-formoterol  (BREZTRI  AEROSPHERE) 160-9-4.8 MCG/ACT AERO  inhaler Inhale 2 puffs into the lungs 2 (two) times daily. 05/18/24   Cannady, Jolene T, NP  buPROPion  (WELLBUTRIN  XL) 300 MG 24 hr tablet Take 1 tablet (300 mg total) by mouth daily. 11/09/23   Cannady, Jolene T, NP  cetirizine  (ZYRTEC ) 10 MG tablet Take 1 tablet by mouth once daily 08/05/24   Cannady, Jolene T, NP  Cholecalciferol  (VITAMIN D3) 1.25 MG (50000 UT) CAPS Take 1 capsule by mouth once a week 01/29/24   Cannady, Jolene T, NP  cyclobenzaprine  (FLEXERIL ) 10 MG tablet TAKE 1 TABLET BY MOUTH AT BEDTIME 09/15/24   Cannady, Jolene T, NP  doxycycline  (VIBRA -TABS) 100 MG tablet Take 1 tablet (100 mg total) by mouth 2 (two) times daily. 12/21/24   Gladis Elsie BROCKS, PA-C  famotidine  (PEPCID ) 20 MG tablet TAKE 1 TABLET BY MOUTH ONCE DAILY AFTER  SUPPER 05/18/24   Cannady, Jolene T, NP  FLUoxetine  (PROZAC ) 20 MG tablet Take 1 tablet (20 mg total) by mouth daily. 05/18/24   Cannady, Jolene T, NP  fluticasone  (FLONASE ) 50 MCG/ACT nasal spray Use 2 spray(s) in each nostril once daily 05/18/24   Cannady, Jolene T, NP  gabapentin  (NEURONTIN ) 300 MG capsule Take one capsule (300 MG) by mouth in the morning and at noon, then before bedtime take 2 capsules (600 MG) by mouth. 05/18/24   Cannady, Jolene T, NP  levothyroxine  (SYNTHROID ) 125 MCG tablet Take 1 tablet (125 mcg  total) by mouth daily. 05/18/24   Cannady, Jolene T, NP  meloxicam  (MOBIC ) 15 MG tablet Take 1 tablet (15 mg total) by mouth daily. 10/22/23   Tobie Franky SQUIBB, DPM  montelukast  (SINGULAIR ) 10 MG tablet Take 1 tablet (10 mg total) by mouth at bedtime. 05/18/24   Cannady, Jolene T, NP  omeprazole  (PRILOSEC) 40 MG capsule TAKE ONE CAPSULE BY MOUTH EVERY EVENING 07/18/21   Darlean Ozell NOVAK, MD  predniSONE  (STERAPRED UNI-PAK 21 TAB) 10 MG (21) TBPK tablet Take following package directions 12/21/24   Gladis Elsie BROCKS, PA-C  promethazine -dextromethorphan  (PROMETHAZINE -DM) 6.25-15 MG/5ML syrup Take 5 mLs by mouth 4 (four) times daily as needed for cough.  12/21/24   Gladis Elsie BROCKS, PA-C  rosuvastatin  (CRESTOR ) 20 MG tablet Take 1 tablet (20 mg total) by mouth daily. 05/18/24   Cannady, Jolene T, NP  traZODone  (DESYREL ) 50 MG tablet TAKE 1 TABLET BY MOUTH AT BEDTIME AS NEEDED 09/06/24   Cannady, Jolene T, NP  vitamin B-12 (CYANOCOBALAMIN ) 500 MCG tablet Take 1 tablet (500 mcg total) by mouth daily. 01/05/23   Valerio Melanie DASEN, NP    Physical Exam   Triage Vital Signs: ED Triage Vitals  Encounter Vitals Group     BP 01/04/25 2232 (!) 148/110     Girls Systolic BP Percentile --      Girls Diastolic BP Percentile --      Boys Systolic BP Percentile --      Boys Diastolic BP Percentile --      Pulse Rate 01/04/25 2232 (!) 112     Resp 01/04/25 2232 19     Temp 01/04/25 2232 98.7 F (37.1 C)     Temp src --      SpO2 01/04/25 2232 97 %     Weight 01/04/25 2231 210 lb (95.3 kg)     Height 01/04/25 2231 5' 7 (1.702 m)     Head Circumference --      Peak Flow --      Pain Score 01/04/25 2231 10     Pain Loc --      Pain Education --      Exclude from Growth Chart --     Most recent vital signs: Vitals:   01/05/25 0558 01/05/25 0721  BP: 107/77   Pulse: 95   Resp: 20   Temp: 98.3 F (36.8 C)   SpO2: 97% 97%     CONSTITUTIONAL: Alert, responds appropriately to questions.  Appears very uncomfortable, poor historian HEAD: Normocephalic; atraumatic EYES: Conjunctivae clear, PERRL, EOMI ENT: normal nose; no rhinorrhea; moist mucous membranes; pharynx without lesions noted; no dental injury; no septal hematoma, no epistaxis; no facial deformity or bony tenderness NECK: Supple, no midline spinal tenderness, step-off or deformity; trachea midline CARD: Regular and tachycardic; S1 and S2 appreciated; no murmurs, no clicks, no rubs, no gallops RESP: Normal chest excursion without splinting or tachypnea; breath sounds clear and equal bilaterally; no wheezes, no rhonchi, no rales; no hypoxia or respiratory distress CHEST:  chest wall  stable, no crepitus or ecchymosis or deformity, nontender to palpation; no flail chest ABD/GI: Non-distended; soft, diffusely tender without guarding or rebound PELVIS:  stable, nontender to palpation BACK:  The back appears normal; diffusely tender over the thoracic and lumbar spine without step-off or deformity, no associated skin changes EXT: Normal ROM in all joints; no edema; normal capillary refill; no cyanosis, no bony tenderness or bony deformity of patient's extremities, no joint effusions, compartments are soft, extremities  are warm and well-perfused, no ecchymosis SKIN: Normal color for age and race; warm NEURO: No facial asymmetry, normal speech, moving all extremities equally, normal sensation, no saddle anesthesia, no hyperreflexia  ED Results / Procedures / Treatments   LABS: (all labs ordered are listed, but only abnormal results are displayed) Labs Reviewed  CBC WITH DIFFERENTIAL/PLATELET - Abnormal; Notable for the following components:      Result Value   WBC 22.0 (*)    Neutro Abs 19.9 (*)    Monocytes Absolute 1.1 (*)    Abs Immature Granulocytes 0.20 (*)    All other components within normal limits  COMPREHENSIVE METABOLIC PANEL WITH GFR - Abnormal; Notable for the following components:   Sodium 133 (*)    CO2 20 (*)    Calcium  8.4 (*)    All other components within normal limits  URINALYSIS, ROUTINE W REFLEX MICROSCOPIC - Abnormal; Notable for the following components:   Color, Urine STRAW (*)    Specific Gravity, Urine 1.045 (*)    Hgb urine dipstick MODERATE (*)    Ketones, ur 5 (*)    All other components within normal limits  RESP PANEL BY RT-PCR (RSV, FLU A&B, COVID)  [id number]  GASTROINTESTINAL PANEL BY PCR, STOOL (REPLACES STOOL CULTURE)  C DIFFICILE QUICK SCREEN W PCR REFLEX    CULTURE, BLOOD (ROUTINE X 2)  CULTURE, BLOOD (ROUTINE X 2)  URINE CULTURE  LIPASE, BLOOD  LACTIC ACID, PLASMA  PROCALCITONIN  URINALYSIS, MICROSCOPIC (REFLEX)     EKG:   EKG Interpretation Date/Time:  Thursday January 05 2025 05:32:31 EST Ventricular Rate:  103 PR Interval:  150 QRS Duration:  76 QT Interval:  352 QTC Calculation: 461 R Axis:   59  Text Interpretation: Sinus tachycardia Cannot rule out Anterior infarct , age undetermined Abnormal ECG When compared with ECG of 04-Dec-2022 13:45, No significant change was found Confirmed by Neomi Neptune [phone]) on 01/05/2025 7:00:23 AM          RADIOLOGY: My personal review and interpretation of imaging: CT scan shows colitis.  Also possible acute T12 compression fracture.  I have personally reviewed all radiology reports. CT L-SPINE NO CHARGE Result Date: 01/05/2025 EXAM: CT OF THE LUMBAR SPINE WITHOUT CONTRAST 01/05/2025 04:26:23 AM TECHNIQUE: CT of the lumbar spine was performed without the administration of intravenous contrast. Multiplanar reformatted images are provided for review. Automated exposure control, iterative reconstruction, and/or weight based adjustment of the mA/kV was utilized to reduce the radiation dose to as low as reasonably achievable. COMPARISON: None available. CLINICAL HISTORY: FINDINGS: BONES AND ALIGNMENT: The lumbar vertebrae maintain their height and alignment. There is a chronic compression deformity of the superior aspect of the T12 vertebral body with mild retropulsion of the posterior superior corner. No acute fracture or suspicious bone lesion. DEGENERATIVE CHANGES: No significant degenerative changes. 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 No acute abnormality. IMPRESSION: 1. Chronic compression deformity of the superior aspect of the T12 vertebral body with mild retropulsion of the posterior superior corner. 2. Prominent sacral Tarlov cysts. Electronically signed by: Evalene Coho MD 01/05/2025 05:18 AM EST RP Workstation: [id number]   CT CHEST ABDOMEN PELVIS W CONTRAST Result Date: 01/05/2025 EXAM: CT CHEST, ABDOMEN AND PELVIS WITH CONTRAST  01/05/2025 04:26:23 AM TECHNIQUE: CT of the chest, abdomen and pelvis was performed with the administration of 100 mL of iohexol  (OMNIPAQUE ) 300 MG/ML solution. Multiplanar reformatted images are provided for review. Automated exposure control, iterative reconstruction, and/or weight based adjustment of the  mA/kV was utilized to reduce the radiation dose to as low as reasonably achievable. COMPARISON: CT of the abdomen and pelvis dated 12/30/2020. CLINICAL HISTORY: trauma trauma trauma FINDINGS: CHEST: MEDIASTINUM AND LYMPH NODES: Heart and pericardium are unremarkable. The central airways are clear. No mediastinal, hilar or axillary lymphadenopathy. LUNGS AND PLEURA: There is a patchy, streaky opacity present posterolaterally within the base of the lingula. There are few reticular opacities also present within the periphery of the lower lobes bilaterally. No pleural effusion. No pneumothorax. ABDOMEN AND PELVIS: LIVER: Unremarkable. GALLBLADDER AND BILE DUCTS: Unremarkable. No biliary ductal dilatation. SPLEEN: No acute abnormality. PANCREAS: No acute abnormality. ADRENAL GLANDS: No acute abnormality. KIDNEYS, URETERS AND BLADDER: There are small simple cysts arising from the lower poles of the kidneys. Per consensus, no follow-up is needed for simple Bosniak type 1 and 2 renal cysts, unless the patient has a malignancy history or risk factors. No stones in the kidneys or ureters. No hydronephrosis. No perinephric or periureteral stranding. Urinary bladder is unremarkable. GI AND BOWEL: Stomach demonstrates no acute abnormality. There is a mild sliding hiatus hernia. There is abnormal thickening of the wall of the descending colon and there is stranding of the adjacent fat, concerning for colitis. There are numerous sigmoid diverticula present, but no definite evidence of diverticulitis. There is no bowel obstruction. REPRODUCTIVE ORGANS: No acute abnormality. PERITONEUM AND RETROPERITONEUM: There is a small  periumbilical fat-containing hernia. There is trace free fluid within the pelvis. No free air. VASCULATURE: Aorta is normal in caliber and demonstrates mild-to-moderate calcific atheromatous disease. ABDOMINAL AND PELVIS LYMPH NODES: No lymphadenopathy. REPRODUCTIVE ORGANS: No acute abnormality. BONES AND SOFT TISSUES: There is a mild chronic compression deformity of T12 with mild retropulsion of the posterior superior corner of the vertebral body. 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 There are prominent sacral Tarlov cysts present bilaterally. There is no evidence of acute bony injury. No focal soft tissue abnormality. IMPRESSION: 1. No evidence of acute bony injury. Chronic mild T12 compression deformity with mild retropulsion. 2. Abnormal thickening of the wall of the descending colon with adjacent fat stranding, concerning for colitis. 3. Patchy streaky opacity at the posterolateral lingular base and mild peripheral reticular opacities in the lower lobes bilaterally, likely atelectasis/scarring. 4. Numerous sigmoid diverticula without definite evidence of diverticulitis. 5. Mild sliding hiatus hernia and small periumbilical fat-containing hernia. Trace pelvic free fluid. Electronically signed by: Evalene Coho MD 01/05/2025 05:17 AM EST RP Workstation: [id number]   CT T-SPINE NO CHARGE Result Date: 01/05/2025 EXAM: CT THORACIC SPINE WITHOUT CONTRAST 01/05/2025 04:26:23 AM TECHNIQUE: CT of the thoracic spine was performed without the administration of intravenous contrast. Multiplanar reformatted images are provided for review. Automated exposure control, iterative reconstruction, and/or weight based adjustment of the mA/kV was utilized to reduce the radiation dose to as low as reasonably achievable. COMPARISON: Thoracic spine radiograph 11/23/2019 and chest radiograph 12/04/2022. CLINICAL HISTORY: FINDINGS: BONES AND ALIGNMENT: Mild thoracic kyphosis. New  superior endplate deformity is identified involving the T12 vertebra with loss of approximately 20% of the vertebral body height. There is 4 mm retropulsion of fracture fragments off the posterior aspect of the superior endplate. This represents an acute fracture. The remaining vertebral body heights are well preserved without signs of additional fracture or subluxation. No suspicious bone lesion. DEGENERATIVE CHANGES: Disc space narrowing and endplate degenerative changes noted at T7 through T12. SOFT TISSUES: No acute abnormality. IMPRESSION: 1. T12 superior endplate compression fracture with approximately 20% height  loss and 4 mm retropulsion. Electronically signed by: Waddell Calk MD 01/05/2025 05:13 AM EST RP Workstation: [id number]     PROCEDURES:  Critical Care performed: Yes, see critical care procedure note(s)   CRITICAL CARE Performed by: Josette Sink   Total critical care time: 30 minutes  Critical care time was exclusive of separately billable procedures and treating other patients.  Critical care was necessary to treat or prevent imminent or life-threatening deterioration.  Critical care was time spent personally by me on the following activities: development of treatment plan with patient and/or surrogate as well as nursing, discussions with consultants, evaluation of patient's response to treatment, examination of patient, obtaining history from patient or surrogate, ordering and performing treatments and interventions, ordering and review of laboratory studies, ordering and review of radiographic studies, pulse oximetry and re-evaluation of patient's condition.   SABRA1-3 Lead EKG Interpretation  Performed by: Daziya Redmond, Josette SAILOR, DO Authorized by: Bryley Chrisman, Josette SAILOR, DO     Interpretation: abnormal     ECG rate:  112   ECG rate assessment: tachycardic     Rhythm: sinus tachycardia     Ectopy: none     Conduction: normal       IMPRESSION / MDM / ASSESSMENT AND PLAN / ED  COURSE  I reviewed the triage vital signs and the nursing notes.  Patient here with abdominal pain, back pain, diarrhea.  The patient is on the cardiac monitor to evaluate for evidence of arrhythmia and/or significant heart rate changes.   DIFFERENTIAL DIAGNOSIS (includes but not limited to):   Colitis, diverticulitis, gastroenteritis, dehydration, fracture, doubt epidural abscess or hematoma, discitis or osteomyelitis, transverse myelitis given no neurodeficits or risk factors  Patient's presentation is most consistent with acute presentation with potential threat to life or bodily function.  PLAN: Will obtain CT imaging of her chest, abdomen pelvis, thoracic and lumbar spine to evaluate for source of pain in her ribs, mid and lower back and abdomen.  Will give IV fluids, pain and nausea medicine.   MEDICATIONS GIVEN IN ED: Medications  lactated ringers  infusion ( Intravenous New Bag/Given 01/05/25 0731)  HYDROmorphone  (DILAUDID ) injection 0.5 mg (has no administration in time range)  ondansetron  (ZOFRAN ) injection 4 mg (has no administration in time range)  acetaminophen  (TYLENOL ) tablet 650 mg (has no administration in time range)    Or  acetaminophen  (TYLENOL ) suppository 650 mg (has no administration in time range)  morphine  (PF) 4 MG/ML injection 4 mg (4 mg Intravenous Given 01/05/25 0316)  ondansetron  (ZOFRAN ) injection 4 mg (4 mg Intravenous Given 01/05/25 0316)  iohexol  (OMNIPAQUE ) 300 MG/ML solution 100 mL (100 mLs Intravenous Contrast Given 01/05/25 0359)  piperacillin -tazobactam (ZOSYN ) IVPB 3.375 g (0 g Intravenous Stopped 01/05/25 0614)  morphine  (PF) 4 MG/ML injection 4 mg (4 mg Intravenous Given 01/05/25 0546)     ED COURSE: Patient's labs show a leukocytosis of 22,000.  Lactic acid level is normal.  Minimal elevation of her procalcitonin.  CT scans reviewed and interpreted by myself and the radiologist and show possible acute versus chronic T12 compression deformity with  mild retropulsion.  She has no neurologic deficits here.  Unclear when this occurred due to patient being a very poor historian but this could be the reason for her back pain.  She would likely need pain control, physical therapy and possible TLSO brace.  CT scan also shows diffuse colitis which I think is causing most of her abdominal pain and diarrhea.  Will send stool studies.  Will  give IV Zosyn  because I favor that this is infectious especially with her having a leukocytosis and being tachycardic.  Given she meets septic criteria will give IV fluids and obtain blood cultures as well.  CT also concerning for possible atelectasis versus pneumonia.  She states she did have some pain that radiated from her back into her ribs but that is reproducible with palpation.  No rib fracture seen or pneumothorax.  She is not having any shortness of breath, cough or congestion.  Clinically this is probably more likely atelectasis.  Will discuss with medicine for admission.   CONSULTS:  Consulted and discussed patient's case with hospitalist, Dr. Cleatus.  I have recommended admission and consulting physician agrees and will place admission orders.  Patient (and family if present) agree with this plan.   I reviewed all nursing notes, vitals, pertinent previous records.  All labs, EKGs, imaging ordered have been independently reviewed and interpreted by myself.    OUTSIDE RECORDS REVIEWED: Reviewed recent family medicine notes.       FINAL CLINICAL IMPRESSION(S) / ED DIAGNOSES   Final diagnoses:  Colitis  Acute sepsis (HCC)  Compression fracture of T12 vertebra, initial encounter (HCC)     Rx / DC Orders   ED Discharge Orders     None        Note:  This document was prepared using Dragon voice recognition software and may include unintentional dictation errors.   Marilena Trevathan, Josette SAILOR, DO 01/05/25 [id number]  "

## 2025-01-05 NOTE — Progress Notes (Signed)
 OT Cancellation Note  Patient Details Name: Audrey Richard MRN: [id number] DOB: 1961-10-01   Cancelled Treatment:    Reason Eval/Treat Not Completed: Patient at procedure or test/ unavailable. Pt not in her room. Will re-attempt OT evaluation at later date/time as pt is available and appropriate.   Genesi Stefanko R., MPH, MS, OTR/L ascom [phone] 01/05/25, 2:42 PM

## 2025-01-06 DIAGNOSIS — Z87891 Personal history of nicotine dependence: Secondary | ICD-10-CM | POA: Diagnosis not present

## 2025-01-06 DIAGNOSIS — F419 Anxiety disorder, unspecified: Secondary | ICD-10-CM | POA: Diagnosis present

## 2025-01-06 DIAGNOSIS — K219 Gastro-esophageal reflux disease without esophagitis: Secondary | ICD-10-CM | POA: Diagnosis present

## 2025-01-06 DIAGNOSIS — Z881 Allergy status to other antibiotic agents status: Secondary | ICD-10-CM | POA: Diagnosis not present

## 2025-01-06 DIAGNOSIS — Z1152 Encounter for screening for COVID-19: Secondary | ICD-10-CM | POA: Diagnosis not present

## 2025-01-06 DIAGNOSIS — J4489 Other specified chronic obstructive pulmonary disease: Secondary | ICD-10-CM | POA: Diagnosis present

## 2025-01-06 DIAGNOSIS — Z79890 Hormone replacement therapy (postmenopausal): Secondary | ICD-10-CM | POA: Diagnosis not present

## 2025-01-06 DIAGNOSIS — K529 Noninfective gastroenteritis and colitis, unspecified: Secondary | ICD-10-CM | POA: Diagnosis present

## 2025-01-06 DIAGNOSIS — Z79899 Other long term (current) drug therapy: Secondary | ICD-10-CM | POA: Diagnosis not present

## 2025-01-06 DIAGNOSIS — Z8249 Family history of ischemic heart disease and other diseases of the circulatory system: Secondary | ICD-10-CM | POA: Diagnosis not present

## 2025-01-06 DIAGNOSIS — J9811 Atelectasis: Secondary | ICD-10-CM | POA: Diagnosis present

## 2025-01-06 DIAGNOSIS — E039 Hypothyroidism, unspecified: Secondary | ICD-10-CM | POA: Diagnosis present

## 2025-01-06 DIAGNOSIS — Z791 Long term (current) use of non-steroidal anti-inflammatories (NSAID): Secondary | ICD-10-CM | POA: Diagnosis not present

## 2025-01-06 DIAGNOSIS — E782 Mixed hyperlipidemia: Secondary | ICD-10-CM | POA: Diagnosis present

## 2025-01-06 DIAGNOSIS — M810 Age-related osteoporosis without current pathological fracture: Secondary | ICD-10-CM | POA: Diagnosis present

## 2025-01-06 DIAGNOSIS — M4854XS Collapsed vertebra, not elsewhere classified, thoracic region, sequela of fracture: Secondary | ICD-10-CM | POA: Diagnosis present

## 2025-01-06 DIAGNOSIS — Z833 Family history of diabetes mellitus: Secondary | ICD-10-CM | POA: Diagnosis not present

## 2025-01-06 DIAGNOSIS — I10 Essential (primary) hypertension: Secondary | ICD-10-CM | POA: Diagnosis present

## 2025-01-06 DIAGNOSIS — G8929 Other chronic pain: Secondary | ICD-10-CM | POA: Diagnosis present

## 2025-01-06 DIAGNOSIS — F32A Depression, unspecified: Secondary | ICD-10-CM | POA: Diagnosis present

## 2025-01-06 LAB — BASIC METABOLIC PANEL WITH GFR
Anion gap: 9 (ref 5–15)
BUN: 13 mg/dL (ref 8–23)
CO2: 25 mmol/L (ref 22–32)
Calcium: 8.3 mg/dL — ABNORMAL LOW (ref 8.9–10.3)
Chloride: 101 mmol/L (ref 98–111)
Creatinine, Ser: 0.85 mg/dL (ref 0.44–1.00)
GFR, Estimated: >60 mL/min
Glucose, Bld: 97 mg/dL (ref 70–99)
Potassium: 3.8 mmol/L (ref 3.5–5.1)
Sodium: 134 mmol/L — ABNORMAL LOW (ref 135–145)

## 2025-01-06 LAB — CBC WITH DIFFERENTIAL/PLATELET
Abs Immature Granulocytes: 0.07 K/uL (ref 0.00–0.07)
Basophils Absolute: 0 K/uL (ref 0.0–0.1)
Basophils Relative: 0 %
Eosinophils Absolute: 0.1 K/uL (ref 0.0–0.5)
Eosinophils Relative: 1 %
HCT: 36.5 % (ref 36.0–46.0)
Hemoglobin: 12 g/dL (ref 12.0–15.0)
Immature Granulocytes: 1 %
Lymphocytes Relative: 6 %
Lymphs Abs: 0.9 K/uL (ref 0.7–4.0)
MCH: 30.6 pg (ref 26.0–34.0)
MCHC: 32.9 g/dL (ref 30.0–36.0)
MCV: 93.1 fL (ref 80.0–100.0)
Monocytes Absolute: 0.9 K/uL (ref 0.1–1.0)
Monocytes Relative: 6 %
Neutro Abs: 12.3 K/uL — ABNORMAL HIGH (ref 1.7–7.7)
Neutrophils Relative %: 86 %
Platelets: 258 K/uL (ref 150–400)
RBC: 3.92 MIL/uL (ref 3.87–5.11)
RDW: 14.5 % (ref 11.5–15.5)
WBC: 14.4 K/uL — ABNORMAL HIGH (ref 4.0–10.5)
nRBC: 0 % (ref 0.0–0.2)

## 2025-01-06 LAB — URINE CULTURE: Culture: <10000 — AB

## 2025-01-06 MED ORDER — PIPERACILLIN-TAZOBACTAM 3.375 G IVPB
3.3750 g | Freq: Three times a day (TID) | INTRAVENOUS | Status: AC
  Administered 2025-01-06 – 2025-01-08 (×6): 3.375 g via INTRAVENOUS
  Filled 2025-01-06 (×6): qty 50

## 2025-01-06 MED ORDER — ENOXAPARIN SODIUM 60 MG/0.6ML IJ SOSY
50.0000 mg | PREFILLED_SYRINGE | INTRAMUSCULAR | Status: DC
  Administered 2025-01-06 – 2025-01-09 (×4): 50 mg via SUBCUTANEOUS
  Filled 2025-01-06 (×4): qty 0.6

## 2025-01-06 MED ORDER — HYDROCODONE-ACETAMINOPHEN 5-325 MG PO TABS
1.0000 | ORAL_TABLET | ORAL | Status: DC | PRN
  Administered 2025-01-06 – 2025-01-09 (×8): 1 via ORAL
  Filled 2025-01-06 (×8): qty 1

## 2025-01-06 MED ORDER — LIDOCAINE 5 % EX PTCH
1.0000 | MEDICATED_PATCH | CUTANEOUS | Status: DC
  Administered 2025-01-06 – 2025-01-10 (×5): 1 via TRANSDERMAL
  Filled 2025-01-06 (×5): qty 1

## 2025-01-06 MED ORDER — LACTATED RINGERS IV SOLN: INTRAVENOUS | Status: AC

## 2025-01-06 NOTE — Progress Notes (Signed)
 " Progress Note   [name] FMW:[id number] DOB: Nov 09, 1961 DOA: 01/05/2025     0 DOS: the patient was seen and examined on 01/06/2025   Brief hospital course: Audrey Richard is a pleasant 64 y.o. female with medical history significant for asthma, anxiety/depression, hypothyroidism, HTN, HLD who presented to ED with 2 weeks of back pain and abdominal pain worsening for the last 3 days.  Patient stated that pain is severe in the lower back 8/10 in intensity, worsening with movement, nonradiating, associated with diffuse abdominal discomfort.   Patient is a poor historian and her story does not appear to be very cohesive.  Patient's daughter was at bedside she helped with the history. Patient stated that she was given doxycycline  2 weeks ago for upper respiratory symptoms.  Now she complains that she had watery diarrhea.  Denies any fever chills, nausea, vomiting, dysuria, hematuria, vaginal bleeding.  She stated that she had back injury in August 2025 and she had tailbone fracture.  She denies any fall at this point.    ED Course: Upon arrival to the ED, patient is found to be leukocytosis at 22, bicarb of 20, sodium 133, CT chest abdomen pelvis showed no acute finding.  There is nonspecific colitis, possible patchy infiltrate in the lungs, sigmoid diverticula without diverticulitis mild sliding hiatal hernia.  Chronic mild T12 compression deformity with mild retropulsion.  Patient was given pain medications and hospitalist service was consulted for evaluation for admission for pain control.    Assessment and Plan:   Intractable back and abdominal pain/colitis - Her CT scan of chest abdomen and pelvis did not show any acute obstruction or fracture. - It shows some possible colitis and diverticulosis. - Continue Zosyn  - Vancomycin  discontinued - Urine analysis did not show obvious infection. - She will be given pain medication - Continue diet as tolerated Continue supplemental IV  fluid    Acute on chronic back pain. - Patient stated that she had fallen in August 2025. - She had tailbone fracture. - She has old T12 compression fracture. - She will be given pain medications and PT/OT. Patient ordered TLSO brace   Acute diarrhea - CT scan showed possible colitis. - Patient complained that she had watery diarrhea for few days. GI panel pending   Anxiety and depression Continue home medications for anxiety and depression fluoxetine , trazodone    Hypothyroidism Continue levothyroxine    Hyperlipidemia - Continue rosuvastatin    GERD Continue Protonix  IV along with Maalox and Carafate .   DVT prophylaxis: Lovenox   Code Status: Full Code  Family Communication: Daughter present at the bedside  Disposition Plan: Home   Subjective:  Patient seen and examined at bedside this morning Still having abdominal pain after eating Denies worsening of nausea vomiting or diarrhea Denies chest pain  Physical Exam: Constitutional: Alert, awake, calm, comfortable HEENT: Neck supple Respiratory: Clear to auscultation B/L, no wheezing, no rales.  Cardiovascular: Regular rate and rhythm, no murmurs / rubs / gallops. No extremity edema. 2+ pedal pulses. No carotid bruits.  Abdomen: Soft, no tenderness, Bowel sounds positive.  Diffuse nonspecific tenderness on throughout the abdomen Musculoskeletal: no clubbing / cyanosis. Good ROM, no contractures. Normal muscle tone.  Skin: no rashes, lesions, ulcers. Neurologic: CN 2-12 grossly intact. Sensation intact, No focal deficit identified Psychiatric: Alert and oriented x 3. Normal mood.       Vitals:   01/05/25 2218 01/06/25 0427 01/06/25 1005 01/06/25 1532  BP:  (!) 137/125 114/73 130/68  Pulse: 86 (!)  105 65 87  Resp:  17 18 16   Temp:  98.7 F (37.1 C) 98.5 F (36.9 C) 98.2 F (36.8 C)  TempSrc:      SpO2: 94% 92% (!) 85% 95%  Weight:      Height:          Latest Ref Rng & Units 01/06/2025   10:22 AM  01/05/2025    3:15 AM 05/18/2024    3:11 PM  CBC  WBC 4.0 - 10.5 K/uL 14.4  22.0  8.3   Hemoglobin 12.0 - 15.0 g/dL 87.9  85.9  85.3   Hematocrit 36.0 - 46.0 % 36.5  43.6  44.9   Platelets 150 - 400 K/uL 258  305  271        Latest Ref Rng & Units 01/06/2025    5:51 AM 01/05/2025    3:15 AM 05/18/2024    3:11 PM  BMP  Glucose 70 - 99 mg/dL 97  88  66   BUN 8 - 23 mg/dL 13  16  14    Creatinine 0.44 - 1.00 mg/dL 9.14  9.11  8.64   BUN/Creat Ratio 12 - 28   10   Sodium 135 - 145 mmol/L 134  133  140   Potassium 3.5 - 5.1 mmol/L 3.8  3.7  4.4   Chloride 98 - 111 mmol/L 101  99  100   CO2 22 - 32 mmol/L 25  20  25    Calcium  8.9 - 10.3 mg/dL 8.3  8.4  9.1      Author: Drue ONEIDA Potter, MD 01/06/2025 5:05 PM  For on call review [email or web address].  "

## 2025-01-06 NOTE — Progress Notes (Signed)
 Physical Therapy Treatment Patient Details Name: Audrey Richard MRN: [id number] DOB: 11/10/1961 Today's Date: 01/06/2025   History of Present Illness Pt is a 64 y/o F admitted on 01/05/25 after presenting with c/o 2 weeks of back pain & abdominal pain, worsening for the last 3 days. Imaging showed nonspecific colitis, possible pathy infiltrate in the lungs, sigmoid diverticula without diverticulitis, chronic mild T12 compression deformity with mild retropulsion. PMH: asthma, anxiety/depression, hypothyroidism, HTN, HLD, COPD    PT Comments  Pt seen for PT tx with pt received asleep in bed but awakened & agreeable to tx. Pt is limited by significant back & abdominal pain throughout mobility. Pt is able to ambulate to/from bathroom with RW & CGA, significantly extra time. Pt also without TLSO in room, f/u with nursing on this. Recommend ongoing PT services to progress mobility as able.    If plan is discharge home, recommend the following: A little help with walking and/or transfers;A little help with bathing/dressing/bathroom;Help with stairs or ramp for entrance;Assist for transportation   Can travel by private vehicle     Yes  Equipment Recommendations  Rolling walker (2 wheels);BSC/3in1    Recommendations for Other Services       Precautions / Restrictions Precautions Precautions: Fall Precaution/Restrictions Comments: back precautions for comfort Required Braces or Orthoses: Spinal Brace Spinal Brace: Thoracolumbosacral orthotic (for comfort) Restrictions Weight Bearing Restrictions Per Provider Order: No     Mobility  Bed Mobility Overal bed mobility: Needs Assistance Bed Mobility: Rolling, Sidelying to Sit Rolling: Supervision, Used rails Sidelying to sit: Min assist, HOB elevated, Used rails       General bed mobility comments: cuing re: log rolling, significantly extra time, exit L side of bed, used bed rails, HOB slightly elevated    Transfers Overall transfer  level: Needs assistance Equipment used: Rolling walker (2 wheels) Transfers: Sit to/from Stand Sit to Stand: Mod assist           General transfer comment: significant assistance for transfer from elevated EOB & toilet with cuing re: use of grab bar, cuing re: hand placement & eccentric lowering    Ambulation/Gait Ambulation/Gait assistance: Contact guard assist Gait Distance (Feet): 15 Feet (+ 15 ft) Assistive device: Rolling walker (2 wheels) Gait Pattern/deviations: Decreased step length - left, Decreased step length - right, Decreased stride length Gait velocity: decreased         Stairs             Wheelchair Mobility     Tilt Bed    Modified Rankin (Stroke Patients Only)       Balance Overall balance assessment: Needs assistance Sitting-balance support: Bilateral upper extremity supported, Feet supported Sitting balance-Leahy Scale: Fair     Standing balance support: Bilateral upper extremity supported, During functional activity, Reliant on assistive device for balance Standing balance-Leahy Scale: Fair                              Hotel Manager: No apparent difficulties  Cognition Arousal: Alert Behavior During Therapy: WFL for tasks assessed/performed   PT - Cognitive impairments: No apparent impairments                         Following commands: Intact      Cueing Cueing Techniques: Verbal cues  Exercises      General Comments General comments (skin integrity, edema, etc.): continent void on toilet  Pertinent Vitals/Pain Pain Assessment Pain Assessment: Faces Faces Pain Scale: Hurts worst Pain Location: abdomen and lower back Pain Descriptors / Indicators: Grimacing, Discomfort, Guarding Pain Intervention(s): Monitored during session, Limited activity within patient's tolerance, Repositioned, Utilized relaxation techniques    Home Living                           Prior Function            PT Goals (current goals can now be found in the care plan section) Acute Rehab PT Goals PT Goal Formulation: With patient Progress towards PT goals: Progressing toward goals    Frequency    Min 2X/week      PT Plan      Co-evaluation              AM-PAC PT 6 Clicks Mobility   Outcome Measure  Help needed turning from your back to your side while in a flat bed without using bedrails?: A Little Help needed moving from lying on your back to sitting on the side of a flat bed without using bedrails?: A Lot Help needed moving to and from a bed to a chair (including a wheelchair)?: A Little Help needed standing up from a chair using your arms (e.g., wheelchair or bedside chair)?: A Lot Help needed to walk in hospital room?: A Little Help needed climbing 3-5 steps with a railing? : Total 6 Click Score: 14    End of Session   Activity Tolerance: Patient limited by pain Patient left: in chair;with call bell/phone within reach Nurse Communication: Mobility status (pt needs chair alarm box) PT Visit Diagnosis: Other abnormalities of gait and mobility (R26.89);Difficulty in walking, not elsewhere classified (R26.2);Muscle weakness (generalized) (M62.81);Pain Pain - part of body:  (back)     Time: 8571-8542 PT Time Calculation (min) (ACUTE ONLY): 29 min  Charges:    $Therapeutic Activity: 23-37 mins PT General Charges $$ ACUTE PT VISIT: 1 Visit                     Richerd Pinal, PT, DPT 01/06/25, 3:19 PM   Richerd CHRISTELLA Pinal 01/06/2025, 3:15 PM

## 2025-01-06 NOTE — Progress Notes (Signed)
 PHARMACY NOTE:  ANTIMICROBIAL RENAL DOSAGE ADJUSTMENT  Current antimicrobial regimen includes a mismatch between antimicrobial dosage and estimated renal function.  As per policy approved by the Pharmacy & Therapeutics and Medical Executive Committees, the antimicrobial dosage will be adjusted accordingly.  Current antimicrobial dosage:  Zosyn  3.375g IV q6H (30-min infusion)  Indication: Colitis  Renal Function:  Estimated Creatinine Clearance: 80.3 mL/min (by C-G formula based on SCr of 0.85 mg/dL). []      On intermittent HD, scheduled: []      On CRRT    Antimicrobial dosage has been changed to:  Zosyn  3.375g IV q8H (4-hr infusion)  Additional comments:   Thank you for allowing pharmacy to be a part of this patient's care.  Elsie CHRISTELLA Piety, [facility] 01/06/2025 2:12 PM

## 2025-01-06 NOTE — Evaluation (Signed)
 Occupational Therapy Evaluation Patient Details Name: Audrey Richard MRN: [id number] DOB: Apr 13, 1961 Today's Date: 01/06/2025   History of Present Illness   64 y.o. female who presented to ED with 2 weeks of back pain and abdominal pain worsening for the last 3 days. Current MD assessment: possible colitis, acute on chronic back pain from old T 12 compression fx and tailbone fx in Aug 2025. PMH of asthma, anxiety/depression, hypothyroidism, HTN, HLD     Clinical Impressions Pt was seen for OT evaluation this date. PTA, she reports she lives in a 2 level townhome with her daughter, but is being evicted. Pt is typically independent with ADLs, IADLs, mobility, driving and working part time as a conservation officer, nature at [employer]. Reports she may be moving in to her other daughter's home in [facility] upon DC. Pt presents with deficits in strength, balance, activity tolerance and pain management limiting their ability to perform ADL management at baseline level. Pt currently requires Mod A for log roll technique to minimize pain to back and abdomen. BUE support required to maintain balance on EOB for extensive time d/t increased pain. Nurse notified and provided pain meds. Mod A required for LB dressing tasks seated at EOB. Required elevated bed height and Mod A for lift off from EOB with cues for hand placement, able to stand and take effortful lateral steps to [facility] with Min A for RW assist prior to return to supine. Min A for BLE management to return. Pt would benefit from skilled OT services to address noted impairments and functional limitations to maximize safety and independence while minimizing future risk of falls, injury, and readmission. Do anticipate the need for follow up OT services upon acute hospital DC.      If plan is discharge home, recommend the following:   A lot of help with bathing/dressing/bathroom;A lot of help with walking and/or transfers;Assistance with cooking/housework;Help with stairs or  ramp for entrance     Functional Status Assessment   Patient has had a recent decline in their functional status and demonstrates the ability to make significant improvements in function in a reasonable and predictable amount of time.     Equipment Recommendations   Other (comment) (defer to next venue)     Recommendations for Other Services         Precautions/Restrictions   Precautions Precautions: Fall Recall of Precautions/Restrictions: Impaired Restrictions Weight Bearing Restrictions Per Provider Order: No     Mobility Bed Mobility Overal bed mobility: Needs Assistance Bed Mobility: Rolling, Sidelying to Sit, Sit to Supine Rolling: Contact guard assist Sidelying to sit: Mod assist, HOB elevated, Used rails   Sit to supine: Min assist   General bed mobility comments: trunkal elevation assist and increased time and effort to reach EOB with assist to scoot to EOB; BLE management to return to supine; very pain limited    Transfers Overall transfer level: Needs assistance Equipment used: Rolling walker (2 wheels) Transfers: Sit to/from Stand Sit to Stand: Mod assist, From elevated surface           General transfer comment: very pain limited during session, notified nurse who provided tylenol ; increased assist to stand and take lateral steps to [facility] this date      Balance Overall balance assessment: Needs assistance Sitting-balance support: Feet supported, Bilateral upper extremity supported Sitting balance-Leahy Scale: Poor Sitting balance - Comments: posterior lean d/t pain during session, required BUE support to maintain balance Postural control: Posterior lean Standing balance support: Reliant on assistive  device for balance, Bilateral upper extremity supported Standing balance-Leahy Scale: Fair Standing balance comment: RW +Min/CGA                           ADL either performed or assessed with clinical judgement   ADL Overall ADL's  : Needs assistance/impaired                     Lower Body Dressing: Moderate assistance;Sit to/from stand;Sitting/lateral leans;Minimal assistance Lower Body Dressing Details (indicate cue type and reason): donn mesh underwear                     Vision         Perception         Praxis         Pertinent Vitals/Pain Pain Assessment Pain Assessment: Faces Faces Pain Scale: Hurts whole lot Pain Location: abdomen and lower back Pain Descriptors / Indicators: Aching, Throbbing, Constant Pain Intervention(s): Limited activity within patient's tolerance, Monitored during session, Repositioned     Extremity/Trunk Assessment Upper Extremity Assessment Upper Extremity Assessment: Generalized weakness;Overall [facility] for tasks assessed   Lower Extremity Assessment Lower Extremity Assessment: Generalized weakness       Communication Communication Communication: No apparent difficulties   Cognition Arousal: Alert Behavior During Therapy: WFL for tasks assessed/performed                                 Following commands: Intact       Cueing  General Comments   Cueing Techniques: Verbal cues  very pain limited during session   Exercises Other Exercises Other Exercises: Edu on role of OT in acute setting.   Shoulder Instructions      Home Living Family/patient expects to be discharged to:: Private residence Living Arrangements: Children Available Help at Discharge: Family;Available PRN/intermittently (lives with daugther who is there intermittently that can provided support) Type of Home: Apartment Home Access: Level entry     Home Layout: Two level Alternate Level Stairs-Number of Steps: 15 Alternate Level Stairs-Rails: Left Bathroom Shower/Tub: Chief Strategy Officer: Standard Bathroom Accessibility: Yes   Home Equipment: None   Additional Comments: per son in room      Prior Functioning/Environment Prior Level  of Function : Independent/Modified Independent             Mobility Comments: pt. independent at baseline for mobility ADLs Comments: ind. for ADLs; pt reports they are being evicted and may be moving in with her other daughter in [place]    OT Problem List: Decreased strength;Decreased activity tolerance;Pain;Impaired balance (sitting and/or standing)   OT Treatment/Interventions: Self-care/ADL training;Therapeutic exercise;Patient/family education;Balance training;Therapeutic activities;DME and/or AE instruction;Energy conservation      OT Goals(Current goals can be found in the care plan section)   Acute Rehab OT Goals Patient Stated Goal: improve pain OT Goal Formulation: With patient Time For Goal Achievement: 01/20/25 Potential to Achieve Goals: Fair ADL Goals Pt Will Perform Upper Body Dressing: with set-up;sitting Pt Will Perform Lower Body Dressing: with supervision;sit to/from stand;sitting/lateral leans Pt Will Transfer to Toilet: with supervision;ambulating   OT Frequency:  Min 2X/week    Co-evaluation              AM-PAC OT 6 Clicks Daily Activity     Outcome Measure Help from another person eating meals?: A Little Help from  another person taking care of personal grooming?: A Little Help from another person toileting, which includes using toliet, bedpan, or urinal?: A Lot Help from another person bathing (including washing, rinsing, drying)?: A Lot Help from another person to put on and taking off regular upper body clothing?: A Little Help from another person to put on and taking off regular lower body clothing?: A Lot 6 Click Score: 15   End of Session Equipment Utilized During Treatment: Rolling walker (2 wheels) Nurse Communication: Mobility status  Activity Tolerance: Patient limited by pain Patient left: in bed;with call bell/phone within reach;with bed alarm set  OT Visit Diagnosis: Other abnormalities of gait and mobility (R26.89);Muscle  weakness (generalized) (M62.81);Pain                Time: 9056-8979 OT Time Calculation (min): 37 min Charges:  OT General Charges $OT Visit: 1 Visit OT Evaluation $OT Eval Moderate Complexity: 1 Mod OT Treatments $Self Care/Home Management : 8-22 mins Blaine Hari Chrismon, OTR/L 01/06/25, 11:15 AM  Venba Zenner E Chrismon 01/06/2025, 11:11 AM

## 2025-01-06 NOTE — Progress Notes (Signed)
 Patient given PRN tylenol  at this time. BP is 90's systolic, and patient is c/o severe back pain. Notified MD of above. PT/OT at bedside at this time.

## 2025-01-07 ENCOUNTER — Inpatient Hospital Stay

## 2025-01-07 DIAGNOSIS — K529 Noninfective gastroenteritis and colitis, unspecified: Secondary | ICD-10-CM | POA: Diagnosis not present

## 2025-01-07 LAB — CBC WITH DIFFERENTIAL/PLATELET
Abs Immature Granulocytes: 0.03 K/uL (ref 0.00–0.07)
Basophils Absolute: 0 K/uL (ref 0.0–0.1)
Basophils Relative: 0 %
Eosinophils Absolute: 0.2 K/uL (ref 0.0–0.5)
Eosinophils Relative: 2 %
HCT: 34.5 % — ABNORMAL LOW (ref 36.0–46.0)
Hemoglobin: 11.2 g/dL — ABNORMAL LOW (ref 12.0–15.0)
Immature Granulocytes: 0 %
Lymphocytes Relative: 9 %
Lymphs Abs: 0.9 K/uL (ref 0.7–4.0)
MCH: 29.9 pg (ref 26.0–34.0)
MCHC: 32.5 g/dL (ref 30.0–36.0)
MCV: 92.2 fL (ref 80.0–100.0)
Monocytes Absolute: 0.7 K/uL (ref 0.1–1.0)
Monocytes Relative: 8 %
Neutro Abs: 7.6 K/uL (ref 1.7–7.7)
Neutrophils Relative %: 81 %
Platelets: 263 K/uL (ref 150–400)
RBC: 3.74 MIL/uL — ABNORMAL LOW (ref 3.87–5.11)
RDW: 14 % (ref 11.5–15.5)
WBC: 9.4 K/uL (ref 4.0–10.5)
nRBC: 0 % (ref 0.0–0.2)

## 2025-01-07 LAB — BASIC METABOLIC PANEL WITH GFR
Anion gap: 8 (ref 5–15)
BUN: 10 mg/dL (ref 8–23)
CO2: 26 mmol/L (ref 22–32)
Calcium: 8.3 mg/dL — ABNORMAL LOW (ref 8.9–10.3)
Chloride: 104 mmol/L (ref 98–111)
Creatinine, Ser: 0.79 mg/dL (ref 0.44–1.00)
GFR, Estimated: >60 mL/min
Glucose, Bld: 89 mg/dL (ref 70–99)
Potassium: 3.4 mmol/L — ABNORMAL LOW (ref 3.5–5.1)
Sodium: 139 mmol/L (ref 135–145)

## 2025-01-07 NOTE — Progress Notes (Signed)
 " Progress Note   [name] FMW:[id number] DOB: [birth year] DOA: 01/05/2025     1 DOS: the patient was seen and examined on 01/07/2025    Brief hospital course: ILZE ROSELLI is a pleasant 64 y.o. female with medical history significant for asthma, anxiety/depression, hypothyroidism, HTN, HLD who presented to ED with 2 weeks of back pain and abdominal pain worsening for the last 3 days.  Patient stated that pain is severe in the lower back 8/10 in intensity, worsening with movement, nonradiating, associated with diffuse abdominal discomfort.   Patient is a poor historian and her story does not appear to be very cohesive.  Patient's daughter was at bedside she helped with the history. Patient stated that she was given doxycycline  2 weeks ago for upper respiratory symptoms.  Now she complains that she had watery diarrhea.  Denies any fever chills, nausea, vomiting, dysuria, hematuria, vaginal bleeding.  She stated that she had back injury in August 2025 and she had tailbone fracture.  She denies any fall at this point.    ED Course: Upon arrival to the ED, patient is found to be leukocytosis at 22, bicarb of 20, sodium 133, CT chest abdomen pelvis showed no acute finding.  There is nonspecific colitis, possible patchy infiltrate in the lungs, sigmoid diverticula without diverticulitis mild sliding hiatal hernia.  Chronic mild T12 compression deformity with mild retropulsion.  Patient was given pain medications and hospitalist service was consulted for evaluation for admission for pain control.     Assessment and Plan:   Intractable back and abdominal pain/colitis - Her CT scan of chest abdomen and pelvis did not show any acute obstruction or fracture. - It shows some possible colitis and diverticulosis. - Continue Zosyn  - Vancomycin  discontinued - Urine analysis did not show obvious infection. - She will be given pain medication - Continue diet as tolerated Continue supplemental  IV fluid     Acute on chronic back pain. - Patient stated that she had fallen in August 2025. - She had tailbone fracture. - She has old T12 compression fracture. - She will be given pain medications and PT/OT. Patient ordered TLSO brace   Acute diarrhea - CT scan showed possible colitis. - Patient complained that she had watery diarrhea for few days. GI panel pending   Anxiety and depression Continue home medications for anxiety and depression fluoxetine , trazodone    Hypothyroidism Continue levothyroxine    Hyperlipidemia - Continue rosuvastatin    GERD Continue Protonix  IV along with Maalox and Carafate .   DVT prophylaxis: Lovenox    Code Status: Full Code   Family Communication: Daughter present at the bedside   Disposition Plan: Home     Subjective:  Patient seen and examined at bedside this morning Still has some abdominal pain but improving Denies nausea vomiting Having difficulty working with PT  Physical Exam: Constitutional: Alert, awake, calm, comfortable HEENT: Neck supple Respiratory: Clear to auscultation B/L, no wheezing, no rales.  Cardiovascular: Regular rate and rhythm, no murmurs / rubs / gallops. No extremity edema. 2+ pedal pulses. No carotid bruits.  Abdomen: Soft, no tenderness, Bowel sounds positive.  Diffuse nonspecific tenderness on throughout the abdomen Musculoskeletal: no clubbing / cyanosis. Good ROM, no contractures. Normal muscle tone.  Skin: no rashes, lesions, ulcers. Neurologic: CN 2-12 grossly intact. Sensation intact, No focal deficit identified Psychiatric: Alert and oriented x 3. Normal mood.       Data reviewed:    Latest Ref Rng & Units 01/07/2025  5:44 AM 01/06/2025   10:22 AM 01/05/2025    3:15 AM  CBC  WBC 4.0 - 10.5 K/uL 9.4  14.4  22.0   Hemoglobin 12.0 - 15.0 g/dL 88.7  87.9  85.9   Hematocrit 36.0 - 46.0 % 34.5  36.5  43.6   Platelets 150 - 400 K/uL 263  258  305        Latest Ref Rng & Units 01/07/2025     5:44 AM 01/06/2025    5:51 AM 01/05/2025    3:15 AM  BMP  Glucose 70 - 99 mg/dL 89  97  88   BUN 8 - 23 mg/dL 10  13  16    Creatinine 0.44 - 1.00 mg/dL 9.20  9.14  9.11   Sodium 135 - 145 mmol/L 139  134  133   Potassium 3.5 - 5.1 mmol/L 3.4  3.8  3.7   Chloride 98 - 111 mmol/L 104  101  99   CO2 22 - 32 mmol/L 26  25  20    Calcium  8.9 - 10.3 mg/dL 8.3  8.3  8.4        Vitals:   01/07/25 0440 01/07/25 0500 01/07/25 0817 01/07/25 1435  BP: 115/66  115/73 124/82  Pulse: 100 89 85 80  Resp: 20  18 18   Temp: 99.1 F (37.3 C)  97.9 F (36.6 C) 97.9 F (36.6 C)  TempSrc:   Oral Oral  SpO2: 91% 93% 96% 95%  Weight:      Height:        Author: Drue ONEIDA Potter, MD 01/07/2025 3:58 PM  For on call review [email or web address].  "

## 2025-01-07 NOTE — Progress Notes (Signed)
 Patient called out for help with a spill. This RN asked a tech to go in to help the patient. When this RN entered the room, patient was standing by the bed and walking in the room with no complaints, no facial grimacing and no moaning. Pt was also in the process of moving her table from one side of the bed to the other. Upon seeing this RN, pt started moaning and stated that she was having 10/10 pain. PRN list checked and timeline of pain medication was provided to patient. Pt then stated the word ice and started moaning again. Patient asked if she was trying to request some ice. Pt stated yes and then asked for 2 additional blankets. Provided the above for the patient. Pt then stated she did not know she was hooked up to the IV line. Pt asked if she had any memory of this RN placing her current IV. Pt states oh I must have forgot. Pt is currently sitting on the side of the bed eating her supper tray.

## 2025-01-07 NOTE — Plan of Care (Signed)
  Problem: Nutrition: Goal: Adequate nutrition will be maintained Outcome: Progressing   Problem: Coping: Goal: Level of anxiety will decrease Outcome: Progressing   Problem: Pain Managment: Goal: General experience of comfort will improve and/or be controlled Outcome: Progressing   Problem: Safety: Goal: Ability to remain free from injury will improve Outcome: Progressing

## 2025-01-08 DIAGNOSIS — K529 Noninfective gastroenteritis and colitis, unspecified: Secondary | ICD-10-CM | POA: Diagnosis not present

## 2025-01-08 LAB — GLUCOSE, CAPILLARY
Glucose-Capillary: 86 mg/dL (ref 70–99)
Glucose-Capillary: 83 mg/dL (ref 70–99)

## 2025-01-08 MED ORDER — POLYETHYLENE GLYCOL 3350 17 G PO PACK
17.0000 g | PACK | Freq: Every day | ORAL | Status: DC
  Administered 2025-01-08 – 2025-01-10 (×3): 17 g via ORAL
  Filled 2025-01-08 (×3): qty 1

## 2025-01-08 MED ORDER — PIPERACILLIN-TAZOBACTAM 3.375 G IVPB
3.3750 g | Freq: Three times a day (TID) | INTRAVENOUS | Status: DC
  Administered 2025-01-08 – 2025-01-10 (×5): 3.375 g via INTRAVENOUS
  Filled 2025-01-08 (×6): qty 50

## 2025-01-08 MED ORDER — DOCUSATE SODIUM 100 MG PO CAPS
100.0000 mg | ORAL_CAPSULE | Freq: Two times a day (BID) | ORAL | Status: DC
  Administered 2025-01-08 – 2025-01-10 (×4): 100 mg via ORAL
  Filled 2025-01-08 (×4): qty 1

## 2025-01-08 MED ORDER — ALBUTEROL SULFATE (2.5 MG/3ML) 0.083% IN NEBU
2.5000 mg | INHALATION_SOLUTION | Freq: Once | RESPIRATORY_TRACT | Status: AC
  Administered 2025-01-08: 2.5 mg via RESPIRATORY_TRACT
  Filled 2025-01-08: qty 3

## 2025-01-08 NOTE — Progress Notes (Signed)
 " Progress Note   [name] FMW:[id number] DOB: 01-13-1961 DOA: 01/05/2025     2 DOS: the patient was seen and examined on 01/08/2025     Brief hospital course: From HPI LYNEA ROLLISON is a pleasant 64 y.o. female with medical history significant for asthma, anxiety/depression, hypothyroidism, HTN, HLD who presented to ED with 2 weeks of back pain and abdominal pain worsening for the last 3 days.  Patient stated that pain is severe in the lower back 8/10 in intensity, worsening with movement, nonradiating, associated with diffuse abdominal discomfort.   Patient is a poor historian and her story does not appear to be very cohesive.  Patient's daughter was at bedside she helped with the history. Patient stated that she was given doxycycline  2 weeks ago for upper respiratory symptoms.  Now she complains that she had watery diarrhea.  Denies any fever chills, nausea, vomiting, dysuria, hematuria, vaginal bleeding.  She stated that she had back injury in August 2025 and she had tailbone fracture.  She denies any fall at this point.    ED Course: Upon arrival to the ED, patient is found to be leukocytosis at 22, bicarb of 20, sodium 133, CT chest abdomen pelvis showed no acute finding.  There is nonspecific colitis, possible patchy infiltrate in the lungs, sigmoid diverticula without diverticulitis mild sliding hiatal hernia.  Chronic mild T12 compression deformity with mild retropulsion.  Patient was given pain medications and hospitalist service was consulted for evaluation for admission for pain control.       Assessment and Plan:   Intractable back and abdominal pain/colitis - Her CT scan of chest abdomen and pelvis did not show any acute obstruction or fracture. - It shows some possible colitis and diverticulosis. - Continue Zosyn  - Vancomycin  discontinued - Urine analysis did not show obvious infection. - She will be given pain medication - Continue diet as tolerated Continue  supplemental IV fluid     Acute on chronic back pain. - Patient stated that she had fallen in August 2025. - She had tailbone fracture. - She has old T12 compression fracture. - She will be given pain medications and PT/OT. Patient ordered TLSO brace   Acute diarrhea - CT scan showed possible colitis. - Patient complained that she had watery diarrhea for few days. GI panel pending   Anxiety and depression Continue home medications for anxiety and depression fluoxetine , trazodone    Hypothyroidism Continue levothyroxine    Hyperlipidemia - Continue rosuvastatin    GERD Continue Protonix  IV along with Maalox and Carafate .   DVT prophylaxis: Lovenox    Code Status: Full Code   Family Communication: Daughter present at the bedside   Disposition Plan: Home     Subjective:  Patient seen and examined at bedside this morning Still has some abdominal pain but improving PT OT has recommended SNF TOC manager working on this   Physical Exam: Constitutional: Alert, awake, calm, comfortable HEENT: Neck supple Respiratory: Clear to auscultation B/L, no wheezing, no rales.  Cardiovascular: Regular rate and rhythm, no murmurs / rubs / gallops. No extremity edema. 2+ pedal pulses. No carotid bruits.  Abdomen: Soft, no tenderness, Bowel sounds positive.  Diffuse nonspecific tenderness on throughout the abdomen Musculoskeletal: no clubbing / cyanosis. Good ROM, no contractures. Normal muscle tone.  Skin: no rashes, lesions, ulcers. Neurologic: CN 2-12 grossly intact. Sensation intact, No focal deficit identified Psychiatric: Alert and oriented x 3. Normal mood.       Data reviewed:    Latest  Ref Rng & Units 01/07/2025    5:44 AM 01/06/2025   10:22 AM 01/05/2025    3:15 AM  CBC  WBC 4.0 - 10.5 K/uL 9.4  14.4  22.0   Hemoglobin 12.0 - 15.0 g/dL 88.7  87.9  85.9   Hematocrit 36.0 - 46.0 % 34.5  36.5  43.6   Platelets 150 - 400 K/uL 263  258  305        Latest Ref Rng & Units  01/07/2025    5:44 AM 01/06/2025    5:51 AM 01/05/2025    3:15 AM  BMP  Glucose 70 - 99 mg/dL 89  97  88   BUN 8 - 23 mg/dL 10  13  16    Creatinine 0.44 - 1.00 mg/dL 9.20  9.14  9.11   Sodium 135 - 145 mmol/L 139  134  133   Potassium 3.5 - 5.1 mmol/L 3.4  3.8  3.7   Chloride 98 - 111 mmol/L 104  101  99   CO2 22 - 32 mmol/L 26  25  20    Calcium  8.9 - 10.3 mg/dL 8.3  8.3  8.4      Vitals:   01/08/25 0610 01/08/25 0846 01/08/25 1500 01/08/25 1600  BP:  135/76 121/78 (!) 140/83  Pulse: 86 89 79 83  Resp:  18 20 20   Temp:  98.7 F (37.1 C) 98.4 F (36.9 C) 99.1 F (37.3 C)  TempSrc:  Oral Oral   SpO2: 92% 97% 94% 95%  Weight:      Height:         Author: Drue ONEIDA Potter, MD 01/08/2025 4:21 PM  For on call review [email or web address]  "

## 2025-01-09 DIAGNOSIS — K529 Noninfective gastroenteritis and colitis, unspecified: Secondary | ICD-10-CM | POA: Diagnosis not present

## 2025-01-09 LAB — BASIC METABOLIC PANEL WITH GFR
Anion gap: 13 (ref 5–15)
BUN: 9 mg/dL (ref 8–23)
CO2: 26 mmol/L (ref 22–32)
Calcium: 8.5 mg/dL — ABNORMAL LOW (ref 8.9–10.3)
Chloride: 102 mmol/L (ref 98–111)
Creatinine, Ser: 0.81 mg/dL (ref 0.44–1.00)
GFR, Estimated: >60 mL/min
Glucose, Bld: 79 mg/dL (ref 70–99)
Potassium: 3.5 mmol/L (ref 3.5–5.1)
Sodium: 141 mmol/L (ref 135–145)

## 2025-01-09 LAB — CBC WITH DIFFERENTIAL/PLATELET
Abs Immature Granulocytes: 0.02 K/uL (ref 0.00–0.07)
Basophils Absolute: 0 K/uL (ref 0.0–0.1)
Basophils Relative: 1 %
Eosinophils Absolute: 0.2 K/uL (ref 0.0–0.5)
Eosinophils Relative: 3 %
HCT: 34.6 % — ABNORMAL LOW (ref 36.0–46.0)
Hemoglobin: 11.5 g/dL — ABNORMAL LOW (ref 12.0–15.0)
Immature Granulocytes: 0 %
Lymphocytes Relative: 12 %
Lymphs Abs: 0.6 K/uL — ABNORMAL LOW (ref 0.7–4.0)
MCH: 30.4 pg (ref 26.0–34.0)
MCHC: 33.2 g/dL (ref 30.0–36.0)
MCV: 91.5 fL (ref 80.0–100.0)
Monocytes Absolute: 0.5 K/uL (ref 0.1–1.0)
Monocytes Relative: 10 %
Neutro Abs: 3.7 K/uL (ref 1.7–7.7)
Neutrophils Relative %: 74 %
Platelets: 289 K/uL (ref 150–400)
RBC: 3.78 MIL/uL — ABNORMAL LOW (ref 3.87–5.11)
RDW: 13.5 % (ref 11.5–15.5)
WBC: 5 K/uL (ref 4.0–10.5)
nRBC: 0 % (ref 0.0–0.2)

## 2025-01-09 NOTE — Progress Notes (Signed)
 Physical Therapy Treatment Patient Details Name: Audrey Richard MRN: [id number] DOB: Feb 24, 1961 Today's Date: 01/09/2025   History of Present Illness Pt is a 64 y/o F admitted on 01/05/25 after presenting with c/o 2 weeks of back pain & abdominal pain, worsening for the last 3 days. Imaging showed nonspecific colitis, possible pathy infiltrate in the lungs, sigmoid diverticula without diverticulitis, chronic mild T12 compression deformity with mild retropulsion. PMH: asthma, anxiety/depression, hypothyroidism, HTN, HLD, COPD    PT Comments  Pt in bed.  Stated she has been getting up/down to [facility] on her own without assist.  Bed alarm off upon arrival.  She agrees to gait.  She had not tried TLSO yet but does agree to try.  She needs max a to don and for education on use.  She is able to walk 60' with RW and heavy lean on RW with slow effortful gait.  She keeps walking despite obvious fatigue and pain and is cues to return to room.  Upon arrival she agrees that was a good distance to walk but if left to her own choice she would have struggled to make it back to room.  She needs assist to remove brace then opts to return to supine.  She does state brace made her back feel better and it was helpful to wear.  Long discussion of rehab vs HHPT.  Pt continues to be unsure about rehab.  Discussed home situation.  Pt stated she will have a second story bedroom and will need to navigate stair.While SNF remains to be recommended she is somewhat hesitant to go.   If family can provide +1 assist for mobility and donning brace home would be reasonable.  She would likely need RW and BSC for safe mobility.  She is encouraged to have a good discussion with family this evening about her assist needs and their ability to provide the needed help.     If plan is discharge home, recommend the following: A little help with walking and/or transfers;A little help with bathing/dressing/bathroom;Help with stairs or ramp for  entrance;Assist for transportation   Can travel by private vehicle        Equipment Recommendations  Rolling walker (2 wheels);BSC/3in1    Recommendations for Other Services       Precautions / Restrictions Precautions Precautions: Fall Precaution/Restrictions Comments: back precautions for comfort Required Braces or Orthoses: Spinal Brace Spinal Brace: Thoracolumbosacral orthotic (for comfort) Restrictions Weight Bearing Restrictions Per Provider Order: No     Mobility  Bed Mobility Overal bed mobility: Needs Assistance Bed Mobility: Supine to Sit, Sit to Supine     Supine to sit: Contact guard, Used rails, HOB elevated Sit to supine: Contact guard assist, HOB elevated, Used rails   General bed mobility comments: relies on bed features and extra time but is able to do on her own Patient Response: Cooperative  Transfers Overall transfer level: Needs assistance Equipment used: Rolling walker (2 wheels) Transfers: Sit to/from Stand Sit to Stand: Contact guard assist                Ambulation/Gait Ambulation/Gait assistance: Min assist Gait Distance (Feet): 50 Feet Assistive device: Rolling walker (2 wheels) Gait Pattern/deviations: Decreased step length - left, Decreased step length - right, Decreased stride length Gait velocity: slow gait with heavy reliance on RW - poor awareness of limitations of gait.  overestimates distances and needs cues to turn         Stairs  Wheelchair Mobility     Tilt Bed Tilt Bed Patient Response: Cooperative  Modified Rankin (Stroke Patients Only)       Balance Overall balance assessment: Needs assistance Sitting-balance support: Bilateral upper extremity supported, Feet supported Sitting balance-Leahy Scale: Fair     Standing balance support: Bilateral upper extremity supported, During functional activity, Reliant on assistive device for balance Standing balance-Leahy Scale: Fair                               Hotel Manager: No apparent difficulties  Cognition Arousal: Alert Behavior During Therapy: WFL for tasks assessed/performed   PT - Cognitive impairments: No apparent impairments                         Following commands: Intact      Cueing Cueing Techniques: Verbal cues  Exercises      General Comments        Pertinent Vitals/Pain Pain Assessment Pain Assessment: Faces Faces Pain Scale: Hurts even more Pain Location: abdomen and lower back Pain Descriptors / Indicators: Grimacing, Discomfort, Guarding Pain Intervention(s): Limited activity within patient's tolerance, Monitored during session, Repositioned    Home Living                          Prior Function            PT Goals (current goals can now be found in the care plan section) Progress towards PT goals: Progressing toward goals    Frequency    Min 2X/week      PT Plan      Co-evaluation              AM-PAC PT 6 Clicks Mobility   Outcome Measure  Help needed turning from your back to your side while in a flat bed without using bedrails?: None Help needed moving from lying on your back to sitting on the side of a flat bed without using bedrails?: A Little Help needed moving to and from a bed to a chair (including a wheelchair)?: None Help needed standing up from a chair using your arms (e.g., wheelchair or bedside chair)?: None Help needed to walk in hospital room?: A Little Help needed climbing 3-5 steps with a railing? : A Little 6 Click Score: 21    End of Session Equipment Utilized During Treatment: Gait belt;Back brace Activity Tolerance: Patient tolerated treatment well;Patient limited by fatigue Patient left: in bed;with call bell/phone within reach Nurse Communication: Mobility status PT Visit Diagnosis: Other abnormalities of gait and mobility (R26.89);Difficulty in walking, not elsewhere classified  (R26.2);Muscle weakness (generalized) (M62.81);Pain     Time: 1420-1443 PT Time Calculation (min) (ACUTE ONLY): 23 min  Charges:    $Gait Training: 23-37 mins PT General Charges $$ ACUTE PT VISIT: 1 Visit                   Lauraine Gills, PTA 01/09/25, 3:16 PM

## 2025-01-09 NOTE — Progress Notes (Signed)
 " Progress Note   [name] FMW:[id number] DOB: 04/01/1961 DOA: 01/05/2025     3 DOS: the patient was seen and examined on 01/09/2025    Brief hospital course: From HPI Audrey Richard is a pleasant 64 y.o. female with medical history significant for asthma, anxiety/depression, hypothyroidism, HTN, HLD who presented to ED with 2 weeks of back pain and abdominal pain worsening for the last 3 days.  Patient stated that pain is severe in the lower back 8/10 in intensity, worsening with movement, nonradiating, associated with diffuse abdominal discomfort.   Patient is a poor historian and her story does not appear to be very cohesive.  Patient's daughter was at bedside she helped with the history. Patient stated that she was given doxycycline  2 weeks ago for upper respiratory symptoms.  Now she complains that she had watery diarrhea.  Denies any fever chills, nausea, vomiting, dysuria, hematuria, vaginal bleeding.  She stated that she had back injury in August 2025 and she had tailbone fracture.  She denies any fall at this point.    ED Course: Upon arrival to the ED, patient is found to be leukocytosis at 22, bicarb of 20, sodium 133, CT chest abdomen pelvis showed no acute finding.  There is nonspecific colitis, possible patchy infiltrate in the lungs, sigmoid diverticula without diverticulitis mild sliding hiatal hernia.  Chronic mild T12 compression deformity with mild retropulsion.  Patient was given pain medications and hospitalist service was consulted for evaluation for admission for pain control.       Assessment and Plan:   Intractable back and abdominal pain/colitis - Her CT scan of chest abdomen and pelvis did not show any acute obstruction or fracture. - It shows some possible colitis and diverticulosis. - Continue Zosyn  - Vancomycin  discontinued - Urine analysis did not show obvious infection. - She will be given pain medication - Continue diet as tolerated Continue  supplemental IV fluid     Acute on chronic back pain. - Patient stated that she had fallen in August 2025. - She had tailbone fracture. - She has old T12 compression fracture. - She will be given pain medications and PT/OT. Patient ordered TLSO brace   Acute diarrhea - CT scan showed possible colitis. - Patient complained that she had watery diarrhea for few days. GI panel pending   Anxiety and depression Continue home medications for anxiety and depression fluoxetine , trazodone    Hypothyroidism Continue levothyroxine    Hyperlipidemia - Continue rosuvastatin    GERD Continue Protonix  IV along with Maalox and Carafate .   DVT prophylaxis: Lovenox    Code Status: Full Code   Family Communication: Daughter present at the bedside   Disposition Plan: Home     Subjective:  Patient seen and examined at bedside this morning Still has some abdominal pain but improving PT OT has recommended SNF TOC manager working on this   Physical Exam: Constitutional: Alert, awake, calm, comfortable HEENT: Neck supple Respiratory: Clear to auscultation B/L, no wheezing, no rales.  Cardiovascular: Regular rate and rhythm, no murmurs / rubs / gallops. No extremity edema. 2+ pedal pulses. No carotid bruits.  Abdomen: Soft, no tenderness, Bowel sounds positive.  Diffuse nonspecific tenderness on throughout the abdomen Musculoskeletal: no clubbing / cyanosis. Good ROM, no contractures. Normal muscle tone.  Skin: no rashes, lesions, ulcers. Neurologic: CN 2-12 grossly intact. Sensation intact, No focal deficit identified Psychiatric: Alert and oriented x 3. Normal mood.       Data reviewed:   Vitals:  01/08/25 2338 01/09/25 0341 01/09/25 0730 01/09/25 1534  BP: 122/73 121/70 117/71 124/76  Pulse: 85 96 80 86  Resp: 16 18 16 18   Temp: 99 F (37.2 C) 98.6 F (37 C) 98.5 F (36.9 C) 98.4 F (36.9 C)  TempSrc:  Oral Oral Oral  SpO2: 93% 93% 96% 95%  Weight:      Height:           Latest Ref Rng & Units 01/09/2025    6:19 AM 01/07/2025    5:44 AM 01/06/2025   10:22 AM  CBC  WBC 4.0 - 10.5 K/uL 5.0  9.4  14.4   Hemoglobin 12.0 - 15.0 g/dL 88.4  88.7  87.9   Hematocrit 36.0 - 46.0 % 34.6  34.5  36.5   Platelets 150 - 400 K/uL 289  263  258        Latest Ref Rng & Units 01/09/2025    6:19 AM 01/07/2025    5:44 AM 01/06/2025    5:51 AM  BMP  Glucose 70 - 99 mg/dL 79  89  97   BUN 8 - 23 mg/dL 9  10  13    Creatinine 0.44 - 1.00 mg/dL 9.18  9.20  9.14   Sodium 135 - 145 mmol/L 141  139  134   Potassium 3.5 - 5.1 mmol/L 3.5  3.4  3.8   Chloride 98 - 111 mmol/L 102  104  101   CO2 22 - 32 mmol/L 26  26  25    Calcium  8.9 - 10.3 mg/dL 8.5  8.3  8.3      Author: Drue ONEIDA Potter, MD 01/09/2025 6:03 PM  For on call review [email or web address]  "

## 2025-01-09 NOTE — Plan of Care (Signed)
  Problem: Clinical Measurements: Goal: Ability to maintain clinical measurements within normal limits will improve Outcome: Progressing   Problem: Pain Managment: Goal: General experience of comfort will improve and/or be controlled Outcome: Progressing   Problem: Safety: Goal: Ability to remain free from injury will improve Outcome: Progressing

## 2025-01-10 ENCOUNTER — Other Ambulatory Visit: Payer: Self-pay

## 2025-01-10 DIAGNOSIS — K529 Noninfective gastroenteritis and colitis, unspecified: Secondary | ICD-10-CM | POA: Diagnosis not present

## 2025-01-10 LAB — CBC WITH DIFFERENTIAL/PLATELET
Abs Immature Granulocytes: 0.01 K/uL (ref 0.00–0.07)
Basophils Absolute: 0 K/uL (ref 0.0–0.1)
Basophils Relative: 0 %
Eosinophils Absolute: 0.1 K/uL (ref 0.0–0.5)
Eosinophils Relative: 3 %
HCT: 34.9 % — ABNORMAL LOW (ref 36.0–46.0)
Hemoglobin: 11.5 g/dL — ABNORMAL LOW (ref 12.0–15.0)
Immature Granulocytes: 0 %
Lymphocytes Relative: 19 %
Lymphs Abs: 0.9 K/uL (ref 0.7–4.0)
MCH: 29.9 pg (ref 26.0–34.0)
MCHC: 33 g/dL (ref 30.0–36.0)
MCV: 90.9 fL (ref 80.0–100.0)
Monocytes Absolute: 0.5 K/uL (ref 0.1–1.0)
Monocytes Relative: 11 %
Neutro Abs: 3 K/uL (ref 1.7–7.7)
Neutrophils Relative %: 67 %
Platelets: 286 K/uL (ref 150–400)
RBC: 3.84 MIL/uL — ABNORMAL LOW (ref 3.87–5.11)
RDW: 13.8 % (ref 11.5–15.5)
WBC: 4.5 K/uL (ref 4.0–10.5)
nRBC: 0 % (ref 0.0–0.2)

## 2025-01-10 LAB — BASIC METABOLIC PANEL WITH GFR
Anion gap: 12 (ref 5–15)
BUN: 8 mg/dL (ref 8–23)
CO2: 28 mmol/L (ref 22–32)
Calcium: 8.4 mg/dL — ABNORMAL LOW (ref 8.9–10.3)
Chloride: 100 mmol/L (ref 98–111)
Creatinine, Ser: 0.76 mg/dL (ref 0.44–1.00)
GFR, Estimated: >60 mL/min
Glucose, Bld: 78 mg/dL (ref 70–99)
Potassium: 3.3 mmol/L — ABNORMAL LOW (ref 3.5–5.1)
Sodium: 140 mmol/L (ref 135–145)

## 2025-01-10 LAB — CULTURE, BLOOD (ROUTINE X 2)
Culture: NO GROWTH
Culture: NO GROWTH
Special Requests: ADEQUATE
Special Requests: ADEQUATE

## 2025-01-10 MED ORDER — POLYETHYLENE GLYCOL 3350 17 GM/SCOOP PO POWD
17.0000 g | Freq: Every day | ORAL | 0 refills | Status: AC
  Filled 2025-01-10: qty 238, 14d supply, fill #0

## 2025-01-10 MED ORDER — AMOXICILLIN-POT CLAVULANATE 875-125 MG PO TABS
1.0000 | ORAL_TABLET | Freq: Two times a day (BID) | ORAL | 0 refills | Status: AC
  Filled 2025-01-10: qty 10, 5d supply, fill #0

## 2025-01-10 MED ORDER — ALBUTEROL SULFATE (2.5 MG/3ML) 0.083% IN NEBU
2.5000 mg | INHALATION_SOLUTION | Freq: Once | RESPIRATORY_TRACT | Status: AC
  Administered 2025-01-10: 2.5 mg via RESPIRATORY_TRACT
  Filled 2025-01-10: qty 3

## 2025-01-10 MED ORDER — DOCUSATE SODIUM 100 MG PO CAPS
100.0000 mg | ORAL_CAPSULE | Freq: Two times a day (BID) | ORAL | 0 refills | Status: AC
  Filled 2025-01-10: qty 10, 5d supply, fill #0

## 2025-01-10 MED ORDER — HYDROCODONE-ACETAMINOPHEN 5-325 MG PO TABS
1.0000 | ORAL_TABLET | ORAL | 0 refills | Status: DC | PRN
  Filled 2025-01-10: qty 12, 2d supply, fill #0

## 2025-01-10 NOTE — Plan of Care (Signed)

## 2025-01-10 NOTE — TOC CM/SW Note (Signed)
 Transition of Care (TOC) CM/SW Note    Ms Audrey Richard requires a bedside commode to engage in activities of daily living. Ms Audrey Richard  is confined to one room/one level of the home and has difficulty utilizing the home bathroom.

## 2025-01-10 NOTE — Progress Notes (Signed)
 Occupational Therapy Treatment Patient Details Name: Audrey Richard MRN: [id number] DOB: 15-Aug-1961 Today's Date: 01/10/2025   History of present illness Pt is a 64 y/o F admitted on 01/05/25 after presenting with c/o 2 weeks of back pain & abdominal pain, worsening for the last 3 days. Imaging showed nonspecific colitis, possible pathy infiltrate in the lungs, sigmoid diverticula without diverticulitis, chronic mild T12 compression deformity with mild retropulsion. PMH: asthma, anxiety/depression, hypothyroidism, HTN, HLD, COPD   OT comments  Pt is seated on EOB on arrival. Pleasant and agreeable to OT session. She continues to have abdominal and back pain. Pt wished to donn TLSO for comfort requiring Mod A with edu provided on proper technique to maximize indep. Pt performed functional mobility ~100 ft using RW with CGA and notable fatigue by end of ambulatory bout, seemingly overestimated current abilities at this time. Pt performed toilet transfer to comfort height toilet with CGA, supervision for clothing management and hygiene after cont BM and void on toilet. Pt continues to fatigue easily and is limited by pain, very far from her baseline function. Reports she would have support on home if she were to decide to return home vs go to STR. Edu on back precautions for comfort during ADLs/transfers to prevent further pain to her lower back and pt verbalized understanding. Pt returned to bed with all needs in place and will cont to require skilled acute OT services to maximize her safety and IND to return to PLOF.       If plan is discharge home, recommend the following:  Assistance with cooking/housework;Help with stairs or ramp for entrance;A little help with walking and/or transfers;A little help with bathing/dressing/bathroom   Equipment Recommendations  Other (comment) (defer to next venue)    Recommendations for Other Services      Precautions / Restrictions Precautions Precautions:  Fall Precaution/Restrictions Comments: back precautions for comfort Required Braces or Orthoses: Spinal Brace Spinal Brace: Thoracolumbosacral orthotic (for comfort) Restrictions Weight Bearing Restrictions Per Provider Order: No       Mobility Bed Mobility               General bed mobility comments: NT found seated EOB upon entry    Transfers Overall transfer level: Needs assistance Equipment used: Rolling walker (2 wheels) Transfers: Sit to/from Stand Sit to Stand: Contact guard assist           General transfer comment: stood from EOB with cues for hand placement to RW and from comfort height toilet; ambulated ~100 ft using RW + CGA     Balance Overall balance assessment: Needs assistance Sitting-balance support: Bilateral upper extremity supported, Feet supported Sitting balance-Leahy Scale: Good     Standing balance support: Bilateral upper extremity supported, During functional activity, Reliant on assistive device for balance Standing balance-Leahy Scale: Fair Standing balance comment: RW                           ADL either performed or assessed with clinical judgement   ADL Overall ADL's : Needs assistance/impaired     Grooming: Wash/dry hands;Standing;Contact guard assist           Upper Body Dressing : Moderate assistance;Sitting Upper Body Dressing Details (indicate cue type and reason): donn TLSO while seated EOB     Toilet Transfer: Comfort height toilet;Rolling walker (2 wheels);Contact guard assist;Supervision/safety   Toileting- Clothing Manipulation and Hygiene: Supervision/safety;Sitting/lateral lean;Sit to/from stand Toileting - Architect Details (indicate cue  type and reason): after cont BM on toilet     Functional mobility during ADLs: Contact guard assist;Rolling walker (2 wheels)      Extremity/Trunk Assessment              Vision       Perception     Praxis     Communication  Communication Communication: No apparent difficulties   Cognition Arousal: Alert Behavior During Therapy: WFL for tasks assessed/performed                                 Following commands: Intact        Cueing   Cueing Techniques: Verbal cues  Exercises Other Exercises Other Exercises: Edu on back precautions for comfort and doffing/donning TLSO while seated at EOB.    Shoulder Instructions       General Comments cont void and BM on toilet during session    Pertinent Vitals/ Pain       Pain Assessment Pain Assessment: 0-10 Pain Score: 9  Pain Location: abdomen and lower back Pain Descriptors / Indicators: Grimacing, Discomfort, Guarding Pain Intervention(s): Monitored during session, Repositioned, Limited activity within patient's tolerance  Home Living                                          Prior Functioning/Environment              Frequency  Min 2X/week        Progress Toward Goals  OT Goals(current goals can now be found in the care plan section)  Progress towards OT goals: Progressing toward goals  Acute Rehab OT Goals Patient Stated Goal: improve pain OT Goal Formulation: With patient Time For Goal Achievement: 01/20/25 Potential to Achieve Goals: Fair  Plan      Co-evaluation                 AM-PAC OT 6 Clicks Daily Activity     Outcome Measure   Help from another person eating meals?: A Little Help from another person taking care of personal grooming?: A Little Help from another person toileting, which includes using toliet, bedpan, or urinal?: A Little Help from another person bathing (including washing, rinsing, drying)?: A Lot Help from another person to put on and taking off regular upper body clothing?: A Little Help from another person to put on and taking off regular lower body clothing?: A Lot 6 Click Score: 16    End of Session Equipment Utilized During Treatment: Rolling walker (2  wheels);Back brace  OT Visit Diagnosis: Other abnormalities of gait and mobility (R26.89);Muscle weakness (generalized) (M62.81);Pain   Activity Tolerance Patient tolerated treatment well   Patient Left with call bell/phone within reach;in chair;with chair alarm set   Nurse Communication Mobility status        Time: 9047-8977 OT Time Calculation (min): 30 min  Charges: OT General Charges $OT Visit: 1 Visit OT Treatments $Self Care/Home Management : 8-22 mins $Therapeutic Exercise: 8-22 mins  Audrey Richard, OTR/L  01/10/25, 11:38 AM   Audrey Richard 01/10/2025, 11:34 AM

## 2025-01-10 NOTE — TOC Transition Note (Signed)
 Transition of Care [facility]) - Discharge Note   Patient Details  Name: Audrey Richard MRN: [id number] Date of Birth: 1961-05-08  Transition of Care [facility]) CM/SW Contact:  Alfonso Rummer, LCSW Phone Number: 01/10/2025, 1:08 PM   Clinical Narrative:    Pt will discharge home. DME order thru [employer]. No further toc needs identified.    Final next level of care: Home/Self Care Barriers to Discharge: Barriers Resolved   Patient Goals and CMS Choice            Discharge Placement                  Name of family member notified: patient will advise family Patient and family notified of of transfer: 01/10/25  Discharge Plan and Services Additional resources added to the After Visit Summary for                  DME Arranged: Bedside commode, Walker rolling DME Agency: [employer] Date DME Agency Contacted: 01/10/25   Representative spoke with at DME Agency: Thomasina [place]            Social Drivers of Health (SDOH) Interventions SDOH Screenings   Food Insecurity: Food Insecurity Present (01/05/2025)  Housing: High Risk (01/05/2025)  Transportation Needs: No Transportation Needs (01/05/2025)  Utilities: Not At Risk (01/05/2025)  Depression (PHQ2-9): Medium Risk (05/18/2024)  Social Connections: Moderately Isolated (01/05/2025)  Tobacco Use: Medium Risk (01/05/2025)     Readmission Risk Interventions     No data to display

## 2025-01-10 NOTE — Discharge Summary (Signed)
 " Physician Discharge Summary   [name] MRN: [id number] DOB: [birth year]  Admit date:     01/05/2025  Discharge date: 01/10/25  Discharge Physician: Drue ONEIDA Potter   PCP: Valerio Melanie ONEIDA, NP   Recommendations at discharge:  Follow-up with PCP and neurosurgery  Discharge Diagnoses: Intractable back and abdominal pain/colitis Acute on chronic back pain. Acute diarrhea Anxiety and depression Hypothyroidism Hyperlipidemia GERD  Hospital Course:  From HPI Audrey Richard is a pleasant 64 y.o. female with medical history significant for asthma, anxiety/depression, hypothyroidism, HTN, HLD who presented to ED with 2 weeks of back pain and abdominal pain worsening for the last 3 days.  Patient stated that pain is severe in the lower back 8/10 in intensity, worsening with movement, nonradiating, associated with diffuse abdominal discomfort.   Patient is a poor historian and her story does not appear to be very cohesive.  Patient's daughter was at bedside she helped with the history. Patient stated that she was given doxycycline  2 weeks ago for upper respiratory symptoms.  Now she complains that she had watery diarrhea.  Denies any fever chills, nausea, vomiting, dysuria, hematuria, vaginal bleeding.  She stated that she had back injury in August 2025 and she had tailbone fracture.  She denies any fall at this point.    ED Course: Upon arrival to the ED, patient is found to be leukocytosis at 22, bicarb of 20, sodium 133, CT chest abdomen pelvis showed no acute finding.  There is nonspecific colitis, possible patchy infiltrate in the lungs, sigmoid diverticula without diverticulitis mild sliding hiatal hernia.  Chronic mild T12 compression deformity with mild retropulsion.  Patient was given pain medications and hospitalist service was consulted for evaluation for admission for pain control.      Assessment and Plan:   Intractable back and abdominal pain/colitis - Her CT scan  of chest abdomen and pelvis did not show any acute obstruction or fracture. - It shows some possible colitis and diverticulosis. Patient received Zosyn  which have been transition to Augmentin  - Vancomycin  discontinued - Urine analysis did not show obvious infection. - She will be given pain medication Abdominal pain improved Patient was recommended for rehab placement however she opted to go home with home health   Acute on chronic back pain. - Patient stated that she had fallen in August 2025. - She had tailbone fracture. - She has old T12 compression fracture. - She will be given pain medications and PT/OT. Patient ordered TLSO brace Outpatient follow-up with neurosurgeon   Acute diarrhea-resolved Anxiety and depression Continue home medications for anxiety and depression fluoxetine , trazodone    Hypothyroidism Continue levothyroxine    Hyperlipidemia - Continue rosuvastatin    GERD Continue PPI    Consultants: None Procedures performed: None Disposition: Home health Diet recommendation:  Cardiac and Carb modified diet DISCHARGE MEDICATION: Allergies as of 01/10/2025       Reactions   Betadine [povidone Iodine] Other (See Comments)   Blisters on skin   Biaxin [clarithromycin]    Patient reports itching, nausea        Medication List     STOP taking these medications    doxycycline  100 MG tablet Commonly known as: VIBRA -TABS   meloxicam  15 MG tablet Commonly known as: MOBIC    predniSONE  10 MG (21) Tbpk tablet Commonly known as: STERAPRED UNI-PAK 21 TAB       TAKE these medications    albuterol  (2.5 MG/3ML) 0.083% nebulizer solution Commonly known as: PROVENTIL  Inhale 3 mLs (2.5  mg total) into the lungs every 2 (two) hours as needed for shortness of breath or wheezing.   albuterol  108 (90 Base) MCG/ACT inhaler Commonly known as: VENTOLIN  HFA Inhale 2 puffs into the lungs every 6 (six) hours as needed for wheezing or shortness of breath.   Breztri   Aerosphere 160-9-4.8 MCG/ACT Aero inhaler Generic drug: budesonide -glycopyrrolate-formoterol  Inhale 2 puffs into the lungs 2 (two) times daily.   buPROPion  300 MG 24 hr tablet Commonly known as: Wellbutrin  XL Take 1 tablet (300 mg total) by mouth daily.   cetirizine  10 MG tablet Commonly known as: ZYRTEC  Take 1 tablet by mouth once daily   cyclobenzaprine  10 MG tablet Commonly known as: FLEXERIL  TAKE 1 TABLET BY MOUTH AT BEDTIME   docusate sodium  100 MG capsule Commonly known as: COLACE Take 1 capsule (100 mg total) by mouth 2 (two) times daily.   famotidine  20 MG tablet Commonly known as: PEPCID  TAKE 1 TABLET BY MOUTH ONCE DAILY AFTER  SUPPER   FLUoxetine  20 MG tablet Commonly known as: PROZAC  Take 1 tablet (20 mg total) by mouth daily.   fluticasone  50 MCG/ACT nasal spray Commonly known as: FLONASE  Use 2 spray(s) in each nostril once daily   gabapentin  300 MG capsule Commonly known as: NEURONTIN  Take one capsule (300 MG) by mouth in the morning and at noon, then before bedtime take 2 capsules (600 MG) by mouth.   HYDROcodone -acetaminophen  5-325 MG tablet Commonly known as: NORCO/VICODIN Take 1 tablet by mouth every 4 (four) hours as needed for severe pain (pain score 7-10) or moderate pain (pain score 4-6).   levothyroxine  125 MCG tablet Commonly known as: SYNTHROID  Take 1 tablet (125 mcg total) by mouth daily.   montelukast  10 MG tablet Commonly known as: SINGULAIR  Take 1 tablet (10 mg total) by mouth at bedtime.   omeprazole  40 MG capsule Commonly known as: PRILOSEC TAKE ONE CAPSULE BY MOUTH EVERY EVENING   polyethylene glycol powder 17 GM/SCOOP powder Commonly known as: GLYCOLAX /MIRALAX  Take 17 g by mouth daily. Dissolve 1 capful (17g) in 4-8 ounces of liquid and take by mouth daily. Start taking on: January 11, 2025   promethazine -dextromethorphan  6.25-15 MG/5ML syrup Commonly known as: PROMETHAZINE -DM Take 5 mLs by mouth 4 (four) times daily as needed  for cough.   rosuvastatin  20 MG tablet Commonly known as: CRESTOR  Take 1 tablet (20 mg total) by mouth daily.   traZODone  50 MG tablet Commonly known as: DESYREL  TAKE 1 TABLET BY MOUTH AT BEDTIME AS NEEDED   vitamin B-12 500 MCG tablet Commonly known as: CYANOCOBALAMIN  Take 1 tablet (500 mcg total) by mouth daily.   Vitamin D3 1.25 MG (50000 UT) Caps Take 1 capsule by mouth once a week               Durable Medical Equipment  (From admission, onward)           Start     Ordered   01/10/25 1256  For home use only DME Walker rolling  Once       Question Answer Comment  Walker: With 5 Inch Wheels   Patient needs a walker to treat with the following condition Ambulatory dysfunction      01/10/25 1255   01/10/25 1256  For home use only DME Bedside commode  Once       Question:  Patient needs a bedside commode to treat with the following condition  Answer:  Ambulatory dysfunction   01/10/25 1255  Discharge Exam: Filed Weights   01/04/25 2231  Weight: 95.3 kg   Respiratory: Clear to auscultation B/L, no wheezing, no rales.  Cardiovascular: Regular rate and rhythm, no murmurs / rubs / gallops. No extremity edema. 2+ pedal pulses. No carotid bruits.  Abdomen: Soft, no tenderness, Bowel sounds positive.  Diffuse nonspecific tenderness on throughout the abdomen Musculoskeletal: no clubbing / cyanosis. Good ROM, no contractures. Normal muscle tone.  Skin: no rashes, lesions, ulcers. Neurologic: CN 2-12 grossly intact. Sensation intact, No focal deficit identified Psychiatric: Alert and oriented x 3. Normal mood.      Condition at discharge: good  The results of significant diagnostics from this hospitalization (including imaging, microbiology, ancillary and laboratory) are listed below for reference.   Imaging Studies: DG Abd 1 View Result Date: 01/07/2025 EXAM: 1 VIEW XRAY OF THE ABDOMEN 01/07/2025 11:57:00 AM COMPARISON: None available. CLINICAL  HISTORY: Abdominal pain FINDINGS: BOWEL: Mild gaseous distension of the colon. SOFT TISSUES: Moderate sized hiatal hernia. No abnormal calcifications. BONES: No acute fracture. IMPRESSION: 1. Mild gaseous distension of the colon. 2. Moderate sized hiatal hernia. Electronically signed by: Lonni Necessary MD 01/07/2025 12:21 PM EST RP Workstation: [id number]   CT L-SPINE NO CHARGE Result Date: 01/05/2025 EXAM: CT OF THE LUMBAR SPINE WITHOUT CONTRAST 01/05/2025 04:26:23 AM TECHNIQUE: CT of the lumbar spine was performed without the administration of intravenous contrast. Multiplanar reformatted images are provided for review. Automated exposure control, iterative reconstruction, and/or weight based adjustment of the mA/kV was utilized to reduce the radiation dose to as low as reasonably achievable. COMPARISON: None available. CLINICAL HISTORY: FINDINGS: BONES AND ALIGNMENT: The lumbar vertebrae maintain their height and alignment. There is a chronic compression deformity of the superior aspect of the T12 vertebral body with mild retropulsion of the posterior superior corner. No acute fracture or suspicious bone lesion. DEGENERATIVE CHANGES: No significant degenerative changes. 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 No acute abnormality. IMPRESSION: 1. Chronic compression deformity of the superior aspect of the T12 vertebral body with mild retropulsion of the posterior superior corner. 2. Prominent sacral Tarlov cysts. Electronically signed by: Evalene Coho MD 01/05/2025 05:18 AM EST RP Workstation: [id number]   CT CHEST ABDOMEN PELVIS W CONTRAST Result Date: 01/05/2025 EXAM: CT CHEST, ABDOMEN AND PELVIS WITH CONTRAST 01/05/2025 04:26:23 AM TECHNIQUE: CT of the chest, abdomen and pelvis was performed with the administration of 100 mL of iohexol  (OMNIPAQUE ) 300 MG/ML solution. Multiplanar reformatted images are provided for review. Automated exposure control, iterative reconstruction,  and/or weight based adjustment of the mA/kV was utilized to reduce the radiation dose to as low as reasonably achievable. COMPARISON: CT of the abdomen and pelvis dated 12/30/2020. CLINICAL HISTORY: trauma trauma trauma FINDINGS: CHEST: MEDIASTINUM AND LYMPH NODES: Heart and pericardium are unremarkable. The central airways are clear. No mediastinal, hilar or axillary lymphadenopathy. LUNGS AND PLEURA: There is a patchy, streaky opacity present posterolaterally within the base of the lingula. There are few reticular opacities also present within the periphery of the lower lobes bilaterally. No pleural effusion. No pneumothorax. ABDOMEN AND PELVIS: LIVER: Unremarkable. GALLBLADDER AND BILE DUCTS: Unremarkable. No biliary ductal dilatation. SPLEEN: No acute abnormality. PANCREAS: No acute abnormality. ADRENAL GLANDS: No acute abnormality. KIDNEYS, URETERS AND BLADDER: There are small simple cysts arising from the lower poles of the kidneys. Per consensus, no follow-up is needed for simple Bosniak type 1 and 2 renal cysts, unless the patient has a malignancy history or risk factors. No stones in the kidneys or ureters. No hydronephrosis.  No perinephric or periureteral stranding. Urinary bladder is unremarkable. GI AND BOWEL: Stomach demonstrates no acute abnormality. There is a mild sliding hiatus hernia. There is abnormal thickening of the wall of the descending colon and there is stranding of the adjacent fat, concerning for colitis. There are numerous sigmoid diverticula present, but no definite evidence of diverticulitis. There is no bowel obstruction. REPRODUCTIVE ORGANS: No acute abnormality. PERITONEUM AND RETROPERITONEUM: There is a small periumbilical fat-containing hernia. There is trace free fluid within the pelvis. No free air. VASCULATURE: Aorta is normal in caliber and demonstrates mild-to-moderate calcific atheromatous disease. ABDOMINAL AND PELVIS LYMPH NODES: No lymphadenopathy. REPRODUCTIVE ORGANS: No  acute abnormality. BONES AND SOFT TISSUES: There is a mild chronic compression deformity of T12 with mild retropulsion of the posterior superior corner of the vertebral body. 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 There are prominent sacral Tarlov cysts present bilaterally. There is no evidence of acute bony injury. No focal soft tissue abnormality. IMPRESSION: 1. No evidence of acute bony injury. Chronic mild T12 compression deformity with mild retropulsion. 2. Abnormal thickening of the wall of the descending colon with adjacent fat stranding, concerning for colitis. 3. Patchy streaky opacity at the posterolateral lingular base and mild peripheral reticular opacities in the lower lobes bilaterally, likely atelectasis/scarring. 4. Numerous sigmoid diverticula without definite evidence of diverticulitis. 5. Mild sliding hiatus hernia and small periumbilical fat-containing hernia. Trace pelvic free fluid. Electronically signed by: Evalene Coho MD 01/05/2025 05:17 AM EST RP Workstation: [id number]   CT T-SPINE NO CHARGE Result Date: 01/05/2025 EXAM: CT THORACIC SPINE WITHOUT CONTRAST 01/05/2025 04:26:23 AM TECHNIQUE: CT of the thoracic spine was performed without the administration of intravenous contrast. Multiplanar reformatted images are provided for review. Automated exposure control, iterative reconstruction, and/or weight based adjustment of the mA/kV was utilized to reduce the radiation dose to as low as reasonably achievable. COMPARISON: Thoracic spine radiograph 11/23/2019 and chest radiograph 12/04/2022. CLINICAL HISTORY: FINDINGS: BONES AND ALIGNMENT: Mild thoracic kyphosis. New superior endplate deformity is identified involving the T12 vertebra with loss of approximately 20% of the vertebral body height. There is 4 mm retropulsion of fracture fragments off the posterior aspect of the superior endplate. This represents an acute fracture. The remaining  vertebral body heights are well preserved without signs of additional fracture or subluxation. No suspicious bone lesion. DEGENERATIVE CHANGES: Disc space narrowing and endplate degenerative changes noted at T7 through T12. SOFT TISSUES: No acute abnormality. IMPRESSION: 1. T12 superior endplate compression fracture with approximately 20% height loss and 4 mm retropulsion. Electronically signed by: Waddell Calk MD 01/05/2025 05:13 AM EST RP Workstation: [id number]    Microbiology: Results for orders placed or performed during the hospital encounter of 01/05/25  Resp panel by RT-PCR (RSV, Flu A&B, Covid) Anterior Nasal Swab     Status: None   Collection Time: 01/05/25  5:24 AM   Specimen: Anterior Nasal Swab  Result Value Ref Range Status   SARS Coronavirus 2 by RT PCR NEGATIVE NEGATIVE Final    Comment: (NOTE) SARS-CoV-2 target nucleic acids are NOT DETECTED.  The SARS-CoV-2 RNA is generally detectable in upper respiratory specimens during the acute phase of infection. The lowest concentration of SARS-CoV-2 viral copies this assay can detect is 138 copies/mL. A negative result does not preclude SARS-Cov-2 infection and should not be used as the sole basis for treatment or other patient management decisions. A negative result may occur with  improper specimen collection/handling, submission of specimen other than  nasopharyngeal swab, presence of viral mutation(s) within the areas targeted by this assay, and inadequate number of viral copies(<138 copies/mL). A negative result must be combined with clinical observations, patient history, and epidemiological information. The expected result is Negative.  Fact Sheet for Patients:  bloggercourse.com  Fact Sheet for Healthcare Providers:  seriousbroker.it  This test is no t yet approved or cleared by the [place]  FDA and  has been authorized for detection and/or diagnosis of SARS-CoV-2  by FDA under an Emergency Use Authorization (EUA). This EUA will remain  in effect (meaning this test can be used) for the duration of the COVID-19 declaration under Section 564(b)(1) of the Act, 21 U.S.C.section 360bbb-3(b)(1), unless the authorization is terminated  or revoked sooner.       Influenza A by PCR NEGATIVE NEGATIVE Final   Influenza B by PCR NEGATIVE NEGATIVE Final    Comment: (NOTE) The Xpert Xpress SARS-CoV-2/FLU/RSV plus assay is intended as an aid in the diagnosis of influenza from Nasopharyngeal swab specimens and should not be used as a sole basis for treatment. Nasal washings and aspirates are unacceptable for Xpert Xpress SARS-CoV-2/FLU/RSV testing.  Fact Sheet for Patients: bloggercourse.com  Fact Sheet for Healthcare Providers: seriousbroker.it  This test is not yet approved or cleared by the [place]  FDA and has been authorized for detection and/or diagnosis of SARS-CoV-2 by FDA under an Emergency Use Authorization (EUA). This EUA will remain in effect (meaning this test can be used) for the duration of the COVID-19 declaration under Section 564(b)(1) of the Act, 21 U.S.C. section 360bbb-3(b)(1), unless the authorization is terminated or revoked.     Resp Syncytial Virus by PCR NEGATIVE NEGATIVE Final    Comment: (NOTE) Fact Sheet for Patients: bloggercourse.com  Fact Sheet for Healthcare Providers: seriousbroker.it  This test is not yet approved or cleared by the [place]  FDA and has been authorized for detection and/or diagnosis of SARS-CoV-2 by FDA under an Emergency Use Authorization (EUA). This EUA will remain in effect (meaning this test can be used) for the duration of the COVID-19 declaration under Section 564(b)(1) of the Act, 21 U.S.C. section 360bbb-3(b)(1), unless the authorization is terminated or revoked.  Performed at  [facility], [address] [place], [place] [address]   Blood culture (routine x 2)     Status: None   Collection Time: 01/05/25  5:25 AM   Specimen: BLOOD  Result Value Ref Range Status   Specimen Description BLOOD LEFT ANTECUBITAL  Final   Special Requests   Final    BOTTLES DRAWN AEROBIC AND ANAEROBIC Blood Culture adequate volume   Culture   Final    NO GROWTH 5 DAYS Performed at [facility], [address] [place], [place] [address]    Report Status 01/10/2025 FINAL  Final  Blood culture (routine x 2)     Status: None   Collection Time: 01/05/25  5:25 AM   Specimen: BLOOD  Result Value Ref Range Status   Specimen Description BLOOD RIGHT ANTECUBITAL  Final   Special Requests   Final    BOTTLES DRAWN AEROBIC AND ANAEROBIC Blood Culture adequate volume   Culture   Final    NO GROWTH 5 DAYS Performed at [facility], [address] [place], [place] [address]    Report Status 01/10/2025 FINAL  Final  Urine Culture     Status: Abnormal   Collection Time: 01/05/25  5:42 AM   Specimen: Urine, Clean Catch  Result Value Ref  Range Status   Specimen Description   Final    URINE, CLEAN CATCH Performed at [facility], [address], [place], [place] [address]    Special Requests   Final    NONE Performed at [facility], [address] [place], [place] [address]    Culture (A)  Final    <10,000 COLONIES/mL INSIGNIFICANT GROWTH Performed at [facility], [address] [place], [place] [address]    Report Status 01/06/2025 FINAL  Final    Labs: CBC: Recent Labs  Lab 01/05/25 0315 01/06/25 1022 01/07/25 0544 01/09/25 0619 01/10/25 0450  WBC 22.0* 14.4* 9.4 5.0 4.5  NEUTROABS 19.9* 12.3* 7.6 3.7 3.0  HGB 14.0 12.0 11.2* 11.5* 11.5*  HCT 43.6 36.5 34.5* 34.6* 34.9*  MCV 95.2 93.1 92.2 91.5 90.9  PLT 305 258 263 289 286   Basic Metabolic Panel: Recent Labs  Lab 01/05/25 0315 01/06/25 0551  01/07/25 0544 01/09/25 0619 01/10/25 0450  NA 133* 134* 139 141 140  K 3.7 3.8 3.4* 3.5 3.3*  CL 99 101 104 102 100  CO2 20* 25 26 26 28   GLUCOSE 88 97 89 79 78  BUN 16 13 10 9 8   CREATININE 0.88 0.85 0.79 0.81 0.76  CALCIUM  8.4* 8.3* 8.3* 8.5* 8.4*   Liver Function Tests: Recent Labs  Lab 01/05/25 0315  AST 18  ALT 15  ALKPHOS 49  BILITOT 0.7  PROT 6.5  ALBUMIN 4.0   CBG: Recent Labs  Lab 01/08/25 1210 01/08/25 1649  GLUCAP 86 83    Discharge time spent:  37 minutes.  Signed: Drue ONEIDA Potter, MD [facility] 01/10/2025 "

## 2025-01-10 NOTE — TOC Initial Note (Signed)
 Transition of Care [facility]) - Initial/Assessment Note    Patient Details  Name: Audrey Richard MRN: [id number] Date of Birth: Aug 01, 1961  Transition of Care [facility]) CM/SW Contact:    Alfonso Rummer, LCSW Phone Number: 01/10/2025, 12:52 PM  Clinical Narrative:                  Pt freports she will not transition to snf and opted to return home with family. No additional toc needs identified. Attending MD notified of pt decision.        Patient Goals and CMS Choice            Expected Discharge Plan and Services                                              Prior Living Arrangements/Services                       Activities of Daily Living   ADL Screening (condition at time of admission) Independently performs ADLs?: No Does the patient have a NEW difficulty with bathing/dressing/toileting/self-feeding that is expected to last >3 days?: Yes (Initiates electronic notice to provider for possible OT consult) Does the patient have a NEW difficulty with getting in/out of bed, walking, or climbing stairs that is expected to last >3 days?: Yes (Initiates electronic notice to provider for possible PT consult) Does the patient have a NEW difficulty with communication that is expected to last >3 days?: No Is the patient deaf or have difficulty hearing?: No Does the patient have difficulty seeing, even when wearing glasses/contacts?: No Does the patient have difficulty concentrating, remembering, or making decisions?: No  Permission Sought/Granted                  Emotional Assessment              Admission diagnosis:  Colitis [K52.9] Acute colitis [K52.9] Compression fracture of T12 vertebra, initial encounter (HCC) [S22.080A] Acute sepsis [facility]) [A41.9] Patient Active Problem List   Diagnosis Date Noted   Acute colitis 01/05/2025   Hypothyroidism 01/05/2025   Hiatal hernia 08/21/2024   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   Multiple joint pain  01/04/2024   Chronic kidney disease, stage 3a (HCC) 12/04/2022   Obesity 12/11/2021   Upper airway cough syndrome 08/06/2021   Aortic atherosclerosis 07/13/2021   B12 deficiency 06/18/2021   Vitamin D  deficiency 06/15/2021   Elevated hemoglobin A1c 05/14/2021   Back pain 01/10/2020   GERD (gastroesophageal reflux disease) 09/29/2019   Primary generalized (osteo)arthritis 09/29/2019   Health care maintenance 09/29/2019   Asthmatic bronchitis , chronic (HCC) 11/15/2018   Allergic rhinitis 12/08/2017   Osteopenia 06/11/2015   Depression 06/11/2015   Insomnia 06/11/2015   Hashimoto's thyroiditis 06/11/2015   Mixed hyperlipidemia 06/11/2015   PCP:  Valerio Melanie DASEN, NP Pharmacy:   [facility] [address]),  - [address] [place]) [place] [address] Phone: [phone] Fax: [phone]     Social Drivers of Health (SDOH) Social History: SDOH Screenings   Food Insecurity: Food Insecurity Present (01/05/2025)  Housing: High Risk (01/05/2025)  Transportation Needs: No Transportation Needs (01/05/2025)  Utilities: Not At Risk (01/05/2025)  Depression (PHQ2-9): Medium Risk (05/18/2024)  Social Connections: Moderately Isolated (01/05/2025)  Tobacco Use: Medium Risk (01/05/2025)   SDOH Interventions:  Readmission Risk Interventions     No data to display

## 2025-01-12 ENCOUNTER — Telehealth: Payer: Self-pay

## 2025-01-12 NOTE — Telephone Encounter (Signed)
 Noted.

## 2025-01-12 NOTE — Transitions of Care (Post Inpatient/ED Visit) (Cosign Needed)
 "  01/12/2025  Name: Audrey Richard MRN: [id number] DOB: [birth year]  Today's TOC FU Call Status: Today's TOC FU Call Status:: Successful TOC FU Call Completed TOC FU Call Complete Date: 01/12/25  Patient's Name and Date of Birth confirmed. Name, DOB  Transition Care Management Follow-up Telephone Call Date of Discharge: 01/10/25 Discharge Facility: [facility]) Type of Discharge: Inpatient Admission Primary Inpatient Discharge Diagnosis:: Noninfective gastroenteritis and colitis, unspecified How have you been since you were released from the hospital?: Better Any questions or concerns?: No  Items Reviewed: Did you receive and understand the discharge instructions provided?: No Medications obtained,verified, and reconciled?: Yes (Medications Reviewed) Any new allergies since your discharge?: No Dietary orders reviewed?: NA Do you have support at home?: Yes People in Home [RPT]: child(ren), adult Name of Support/Comfort Primary Source: Morna  Medications Reviewed Today: Medications Reviewed Today     Reviewed by Janeice Comer HERO, CMA (Certified Medical Assistant) on 01/12/25 at 1356  Med List Status: <None>   Medication Order Taking? Sig Documenting Provider Last Dose Status Informant  albuterol  (PROVENTIL ) (2.5 MG/3ML) 0.083% nebulizer solution [id number] Yes Inhale 3 mLs (2.5 mg total) into the lungs every 2 (two) hours as needed for shortness of breath or wheezing. Valerio Moris T, NP  Active   albuterol  (VENTOLIN  HFA) 108 (90 Base) MCG/ACT inhaler [id number] Yes Inhale 2 puffs into the lungs every 6 (six) hours as needed for wheezing or shortness of breath. Cannady, Jolene T, NP  Active Self  amoxicillin -clavulanate (AUGMENTIN ) 875-125 MG tablet [id number] Yes Take 1 tablet by mouth 2 (two) times daily for 5 days. Dorinda Drue DASEN, MD  Active   budesonide -glycopyrrolate-formoterol  (BREZTRI  AEROSPHERE) 160-9-4.8 MCG/ACT AERO inhaler [id number] Yes  Inhale 2 puffs into the lungs 2 (two) times daily. Cannady, Jolene T, NP  Active Self  buPROPion  (WELLBUTRIN  XL) 300 MG 24 hr tablet [id number] Yes Take 1 tablet (300 mg total) by mouth daily. Valerio Moris T, NP  Active Self  cetirizine  (ZYRTEC ) 10 MG tablet [id number] Yes Take 1 tablet by mouth once daily Cannady, Jolene T, NP  Active Self  Cholecalciferol  (VITAMIN D3) 1.25 MG (50000 UT) CAPS [id number] Yes Take 1 capsule by mouth once a week Cannady, Jolene T, NP  Active Self  cyclobenzaprine  (FLEXERIL ) 10 MG tablet [id number] Yes TAKE 1 TABLET BY MOUTH AT BEDTIME Cannady, Jolene T, NP  Active Self  docusate sodium  (COLACE) 100 MG capsule [id number] Yes Take 1 capsule (100 mg total) by mouth 2 (two) times daily. Dorinda Drue DASEN, MD  Active   famotidine  (PEPCID ) 20 MG tablet [id number] Yes TAKE 1 TABLET BY MOUTH ONCE DAILY AFTER  SUPPER Cannady, Jolene T, NP  Active Self  FLUoxetine  (PROZAC ) 20 MG tablet [id number] Yes Take 1 tablet (20 mg total) by mouth daily. Cannady, Jolene T, NP  Active Self  fluticasone  (FLONASE ) 50 MCG/ACT nasal spray [id number] Yes Use 2 spray(s) in each nostril once daily Cannady, Jolene T, NP  Active Self  gabapentin  (NEURONTIN ) 300 MG capsule [id number] Yes Take one capsule (300 MG) by mouth in the morning and at noon, then before bedtime take 2 capsules (600 MG) by mouth. Cannady, Jolene T, NP  Active Self  HYDROcodone -acetaminophen  (NORCO/VICODIN) 5-325 MG tablet [id number] Yes Take 1 tablet by mouth every 4 (four) hours as needed for severe pain (pain score 7-10) or moderate pain (pain score 4-6). Dorinda Drue DASEN, MD  Active   levothyroxine  (SYNTHROID ) 125 MCG tablet [id number] Yes Take 1 tablet (125 mcg  total) by mouth daily. Cannady, Jolene T, NP  Active Self  montelukast  (SINGULAIR ) 10 MG tablet [id number] Yes Take 1 tablet (10 mg total) by mouth at bedtime. Cannady, Jolene T, NP  Active Self  omeprazole  (PRILOSEC) 40 MG capsule [id number] Yes TAKE ONE CAPSULE BY MOUTH EVERY  KARNA Darlean Ozell KATHEE, MD  Active Self  polyethylene glycol powder (GLYCOLAX /MIRALAX ) 17 GM/SCOOP powder [id number] Yes Take 17 g by mouth daily. Dissolve 1 capful (17g) in 4-8 ounces of liquid and take by mouth daily. Dorinda Drue DASEN, MD  Active   promethazine -dextromethorphan  (PROMETHAZINE -DM) 6.25-15 MG/5ML syrup [id number] Yes Take 5 mLs by mouth 4 (four) times daily as needed for cough. Gladis Elsie BROCKS, PA-C  Active Self  rosuvastatin  (CRESTOR ) 20 MG tablet [id number] Yes Take 1 tablet (20 mg total) by mouth daily. Cannady, Jolene T, NP  Active Self  traZODone  (DESYREL ) 50 MG tablet [id number] Yes TAKE 1 TABLET BY MOUTH AT BEDTIME AS NEEDED Cannady, Jolene T, NP  Active Self  vitamin B-12 (CYANOCOBALAMIN ) 500 MCG tablet [id number] Yes Take 1 tablet (500 mcg total) by mouth daily. Valerio Moris T, NP  Active Self            Home Care and Equipment/Supplies: Were Home Health Services Ordered?: No Any new equipment or medical supplies ordered?: No  Functional Questionnaire: Do you need assistance with bathing/showering or dressing?: No Do you need assistance with meal preparation?: No Do you need assistance with eating?: No Do you have difficulty maintaining continence: No Do you need assistance with getting out of bed/getting out of a chair/moving?: No Do you have difficulty managing or taking your medications?: No  Follow up appointments reviewed: PCP Follow-up appointment confirmed?: No MD Provider Line Number:[phone] Given: No Specialist Hospital Follow-up appointment confirmed?: NA Do you need transportation to your follow-up appointment?: No Do you understand care options if your condition(s) worsen?: Yes-patient verbalized understanding     "

## 2025-01-21 NOTE — Patient Instructions (Signed)
 Depression in Adults: How to Manage  Depression is a mental health condition that affects your thoughts, feelings, and actions. Being diagnosed with depression can bring you relief if you did not know why you have felt or behaved a certain way. It could also leave you feeling overwhelmed. Finding ways to manage your symptoms can help you feel more positive about your future. How to manage lifestyle changes Being depressed is difficult. Depression can increase the level of everyday stress. Stress can make depression symptoms worse. You may believe your symptoms cannot be managed or will never improve. However, there are many things you can try to help manage your symptoms. There is hope. Managing stress  Stress is your body's reaction to life changes and events, both good and bad. Stress can add to your feelings of depression. Learning to manage your stress can help lessen your feelings of depression. Try some of the following approaches to reducing your stress (stress reduction techniques): Listen to music that you enjoy and that inspires you. Try using a meditation app or take a meditation class. Develop a practice that helps you connect with your spiritual self. Walk in nature, pray, or go to a place of worship. Practice deep breathing. To do this, inhale slowly through your nose. Pause at the top of your inhale for a few seconds and then exhale slowly, letting yourself relax. Repeat this three or four times. Practice yoga to help relax and work your muscles. Choose a stress reduction technique that works for you. These techniques take time and practice to develop. Set aside 5-15 minutes a day to do them. Therapists can offer training in these techniques. Do these things to help manage stress: Keep a journal. Know your limits. Set healthy boundaries for yourself and others, such as saying no when you think something is too much. Pay attention to how you react to certain situations. You may not be  able to control everything, but you can change your reaction. Add humor to your life by watching funny movies or shows. Make time for activities that you enjoy and that relax you. Spend less time using electronics, especially at night before bed. The light from screens can make your brain think it is time to get up rather than go to bed.  Medicines Medicines, such as antidepressants, are often a part of treatment for depression. Talk with your pharmacist or health care provider about all the medicines, supplements, and herbal products that you take, their possible side effects, and what medicines and other products are safe to take together. Make sure to report any side effects you may have to your health care provider. Relationships Your health care provider may suggest family therapy, couples therapy, or individual therapy as part of your treatment. How to recognize changes Everyone responds differently to treatment for depression. As you recover from depression, you may start to: Have more interest in doing activities. Feel more hopeful. Have more energy. Eat a more regular amount of food. Have better mental focus. It is important to recognize if your depression is not getting better or is getting worse. The symptoms you had in the beginning may return, such as: Feeling tired. Eating too much or too little. Sleeping too much or too little. Feeling restless, agitated, or hopeless. Trouble focusing or making decisions. Having unexplained aches and pains. Feeling irritable, angry, or aggressive. If you or your family members notice these symptoms coming back, let your health care provider know right away. Follow these instructions at  home: Activity Try to get some form of exercise each day, such as walking. Try yoga, mindfulness, or other stress reduction techniques. Participate in group activities if you are able. Lifestyle Get enough sleep. Cut down on or stop using caffeine,  tobacco, alcohol, and any other harmful substances. Eat a healthy diet that includes plenty of vegetables, fruits, whole grains, low-fat dairy products, and lean protein. Limit foods that are high in solid fats, added sugar, or salt (sodium). General instructions Take over-the-counter and prescription medicines only as told by your health care provider. Keep all follow-up visits. It is important for your health care provider to check on your mood, behavior, and medicines. Your health care provider may need to make changes to your treatment. Where to find support Talking to others  Friends and family members can be sources of support and guidance. Talk to trusted friends or family members about your condition. Explain your symptoms and let them know that you are working with a health care provider to treat your depression. Tell friends and family how they can help. Finances Find mental health providers that fit with your financial situation. Talk with your health care provider if you are worried about access to food, housing, or medicine. Call your insurance company to learn about your co-pays and prescription plan. Where to find more information You can find support in your area from: Anxiety and Depression Association of America (ADAA): adaa.org Mental Health America: mentalhealthamerica.net [employer] on Mental Illness: nami.org Contact a health care provider if: You stop taking your antidepressant medicines, and you have any of these symptoms: Nausea. Headache. Light-headedness. Chills and body aches. Not being able to sleep (insomnia). You or your friends and family think your depression is getting worse. Get help right away if: You have thoughts of hurting yourself or others. Get help right away if you feel like you may hurt yourself or others, or have thoughts about taking your own life. Go to your nearest emergency room or: Call 911. Call the National Suicide Prevention  Lifeline at 570-232-2010 or 988. This is open 24 hours a day. Text the Crisis Text Line at 603-491-5049. This information is not intended to replace advice given to you by your health care provider. Make sure you discuss any questions you have with your health care provider. Document Revised: 10/18/2024 Document Reviewed: 04/15/2022 Elsevier Patient Education  2025 [employer].

## 2025-01-25 ENCOUNTER — Encounter: Payer: Self-pay | Admitting: Nurse Practitioner

## 2025-01-25 ENCOUNTER — Ambulatory Visit: Admitting: Nurse Practitioner

## 2025-01-25 VITALS — BP 111/78 | HR 85 | Temp 98.2°F | Ht 66.8 in | Wt 192.2 lb

## 2025-01-25 DIAGNOSIS — K529 Noninfective gastroenteritis and colitis, unspecified: Secondary | ICD-10-CM

## 2025-01-25 DIAGNOSIS — E063 Autoimmune thyroiditis: Secondary | ICD-10-CM

## 2025-01-25 DIAGNOSIS — K219 Gastro-esophageal reflux disease without esophagitis: Secondary | ICD-10-CM

## 2025-01-25 DIAGNOSIS — S22000A Wedge compression fracture of unspecified thoracic vertebra, initial encounter for closed fracture: Secondary | ICD-10-CM | POA: Insufficient documentation

## 2025-01-25 DIAGNOSIS — N1831 Chronic kidney disease, stage 3a: Secondary | ICD-10-CM

## 2025-01-25 DIAGNOSIS — F324 Major depressive disorder, single episode, in partial remission: Secondary | ICD-10-CM

## 2025-01-25 DIAGNOSIS — J4489 Other specified chronic obstructive pulmonary disease: Secondary | ICD-10-CM

## 2025-01-25 DIAGNOSIS — M8588 Other specified disorders of bone density and structure, other site: Secondary | ICD-10-CM

## 2025-01-25 DIAGNOSIS — E782 Mixed hyperlipidemia: Secondary | ICD-10-CM

## 2025-01-25 DIAGNOSIS — E538 Deficiency of other specified B group vitamins: Secondary | ICD-10-CM

## 2025-01-25 MED ORDER — BREZTRI AEROSPHERE 160-9-4.8 MCG/ACT IN AERO: 2.0000 | INHALATION_SPRAY | Freq: Two times a day (BID) | RESPIRATORY_TRACT | 11 refills | Status: AC

## 2025-01-25 MED ORDER — BUPROPION HCL ER (XL) 300 MG PO TB24: 300.0000 mg | ORAL_TABLET | Freq: Every day | ORAL | 4 refills | Status: AC

## 2025-01-25 MED ORDER — VITAMIN B-12 500 MCG PO TABS: 500.0000 ug | ORAL_TABLET | Freq: Every day | ORAL | 4 refills | Status: AC

## 2025-01-25 MED ORDER — HYDROCODONE-ACETAMINOPHEN 5-325 MG PO TABS: 1.0000 | ORAL_TABLET | ORAL | 0 refills | Status: AC | PRN

## 2025-01-25 NOTE — Assessment & Plan Note (Signed)
 Noted on past imaging. Recommend taking Vitamin D3 2000 units daily and will obtain DEXA at age 64.

## 2025-01-25 NOTE — Progress Notes (Signed)
 "  BP 111/78   Pulse 85   Temp 98.2 F (36.8 C) (Oral)   Ht 5' 6.8 (1.697 m)   Wt 192 lb 3.2 oz (87.2 kg)   LMP  (LMP Unknown)   SpO2 94%   BMI 30.28 kg/m    Subjective:    Patient ID: Audrey Richard, female    DOB: [birth year], 64 y.o.   MRN: [id number]  HPI: ROSALEAH Richard is a 64 y.o. female  Chief Complaint  Patient presents with   Hospitalization Follow-up    Admitted to [facility] 01/05/25 - 01/10/25   Lost to follow-up since 04/28/2024. Daughter, Inocente at bedside, to assist with HPI.  Transition of Care Hospital Follow up.  Admitted to [facility] on 01/05/25 for acute colitis - two weeks of back and abdominal pain. Was discharged on 01/10/25 with instructions to follow-up with PCP and neurosurgery. Has history of fall with tailbone pain in August 2025, fell down 15 steps. She did not go to hospital at that time, she felt she had only bruised tailbone. While in hospital this time imaging was performed and did note a chronic compression deformity of the superior aspect of the T12 vertebral body + 4 mm retropulsion and prominent sacral Tarlov cysts. CT abdomen noted wall thickening consistent with colitis and mild sliding hiatal hernia + small periumbilical hernia. Has underlying osteopenia at baseline and takes Vitamin D  daily. On review of labs at discharge H/H slightly lower than baseline, but trending up.  Is having back pain today, 10/10 she reports - to mid and lower back. Constant aching pain. Currently no home health coming into household for PT or OT.  Was given Norco at discharge to take as needed for pain, she is out of this. Pain medication does ease her pain. She is not scheduled to see neurosurgery as directed at discharge. Pain makes it hard to even roll over. Abdominal pain has improved, a lot better than what it was. Taking Colace and Miralax  at home, passing bowels. Had last BM yesterday. No straining.  Hospital Course:   From HPI JAQUANDA WICKERSHAM is a pleasant 64 y.o. female  with medical history significant for asthma, anxiety/depression, hypothyroidism, HTN, HLD who presented to ED with 2 weeks of back pain and abdominal pain worsening for the last 3 days.  Patient stated that pain is severe in the lower back 8/10 in intensity, worsening with movement, nonradiating, associated with diffuse abdominal discomfort.   Patient is a poor historian and her story does not appear to be very cohesive.  Patient's daughter was at bedside she helped with the history. Patient stated that she was given doxycycline  2 weeks ago for upper respiratory symptoms.  Now she complains that she had watery diarrhea.  Denies any fever chills, nausea, vomiting, dysuria, hematuria, vaginal bleeding.  She stated that she had back injury in August 2025 and she had tailbone fracture.  She denies any fall at this point.    ED Course: Upon arrival to the ED, patient is found to be leukocytosis at 22, bicarb of 20, sodium 133, CT chest abdomen pelvis showed no acute finding.  There is nonspecific colitis, possible patchy infiltrate in the lungs, sigmoid diverticula without diverticulitis mild sliding hiatal hernia.  Chronic mild T12 compression deformity with mild retropulsion.  Patient was given pain medications and hospitalist service was consulted for evaluation for admission for pain control.      Assessment and Plan:   Intractable back and abdominal pain/colitis -  Her CT scan of chest abdomen and pelvis did not show any acute obstruction or fracture. - It shows some possible colitis and diverticulosis. Patient received Zosyn  which have been transition to Augmentin  - Vancomycin  discontinued - Urine analysis did not show obvious infection. - She will be given pain medication Abdominal pain improved Patient was recommended for rehab placement however she opted to go home with home health   Acute on chronic back pain. - Patient stated that she had fallen in August 2025. - She had tailbone  fracture. - She has old T12 compression fracture. - She will be given pain medications and PT/OT. Patient ordered TLSO brace Outpatient follow-up with neurosurgeon   Acute diarrhea-resolved Anxiety and depression Continue home medications for anxiety and depression fluoxetine , trazodone    Hypothyroidism Continue levothyroxine    Hyperlipidemia - Continue rosuvastatin    GERD Continue PPI  Hospital/Facility: [facility] Physician: Dr. Dorinda D/C Date: 01/10/25  Records Requested: 01/25/25 Records Received: 01/25/25 Records Reviewed: 01/25/25  Diagnoses on Discharge:  Intractable back and abdominal pain/colitis Acute on chronic back pain.  Date of interactive Contact within 48 hours of discharge:  Contact was through: phone on 01/12/25  Date of 7 day or 14 day face-to-face visit:   15 days since discharge  Outpatient Encounter Medications as of 01/25/2025  Medication Sig   albuterol  (PROVENTIL ) (2.5 MG/3ML) 0.083% nebulizer solution Inhale 3 mLs (2.5 mg total) into the lungs every 2 (two) hours as needed for shortness of breath or wheezing.   albuterol  (VENTOLIN  HFA) 108 (90 Base) MCG/ACT inhaler Inhale 2 puffs into the lungs every 6 (six) hours as needed for wheezing or shortness of breath.   cetirizine  (ZYRTEC ) 10 MG tablet Take 1 tablet by mouth once daily   Cholecalciferol  (VITAMIN D3) 1.25 MG (50000 UT) CAPS Take 1 capsule by mouth once a week   cyclobenzaprine  (FLEXERIL ) 10 MG tablet TAKE 1 TABLET BY MOUTH AT BEDTIME   docusate sodium  (COLACE) 100 MG capsule Take 1 capsule (100 mg total) by mouth 2 (two) times daily.   famotidine  (PEPCID ) 20 MG tablet TAKE 1 TABLET BY MOUTH ONCE DAILY AFTER  SUPPER   FLUoxetine  (PROZAC ) 20 MG tablet Take 1 tablet (20 mg total) by mouth daily.   fluticasone  (FLONASE ) 50 MCG/ACT nasal spray Use 2 spray(s) in each nostril once daily   gabapentin  (NEURONTIN ) 300 MG capsule Take one capsule (300 MG) by mouth in the morning and at noon, then before  bedtime take 2 capsules (600 MG) by mouth.   levothyroxine  (SYNTHROID ) 125 MCG tablet Take 1 tablet (125 mcg total) by mouth daily.   montelukast  (SINGULAIR ) 10 MG tablet Take 1 tablet (10 mg total) by mouth at bedtime.   omeprazole  (PRILOSEC) 40 MG capsule TAKE ONE CAPSULE BY MOUTH EVERY EVENING   polyethylene glycol powder (GLYCOLAX /MIRALAX ) 17 GM/SCOOP powder Take 17 g by mouth daily. Dissolve 1 capful (17g) in 4-8 ounces of liquid and take by mouth daily.   promethazine -dextromethorphan  (PROMETHAZINE -DM) 6.25-15 MG/5ML syrup Take 5 mLs by mouth 4 (four) times daily as needed for cough.   rosuvastatin  (CRESTOR ) 20 MG tablet Take 1 tablet (20 mg total) by mouth daily.   traZODone  (DESYREL ) 50 MG tablet TAKE 1 TABLET BY MOUTH AT BEDTIME AS NEEDED   [DISCONTINUED] budesonide -glycopyrrolate-formoterol  (BREZTRI  AEROSPHERE) 160-9-4.8 MCG/ACT AERO inhaler Inhale 2 puffs into the lungs 2 (two) times daily.   [DISCONTINUED] buPROPion  (WELLBUTRIN  XL) 300 MG 24 hr tablet Take 1 tablet (300 mg total) by mouth daily.   [  DISCONTINUED] HYDROcodone -acetaminophen  (NORCO/VICODIN) 5-325 MG tablet Take 1 tablet by mouth every 4 (four) hours as needed for severe pain (pain score 7-10) or moderate pain (pain score 4-6).   [DISCONTINUED] vitamin B-12 (CYANOCOBALAMIN ) 500 MCG tablet Take 1 tablet (500 mcg total) by mouth daily.   budesonide -glycopyrrolate-formoterol  (BREZTRI  AEROSPHERE) 160-9-4.8 MCG/ACT AERO inhaler Inhale 2 puffs into the lungs 2 (two) times daily.   buPROPion  (WELLBUTRIN  XL) 300 MG 24 hr tablet Take 1 tablet (300 mg total) by mouth daily.   HYDROcodone -acetaminophen  (NORCO/VICODIN) 5-325 MG tablet Take 1 tablet by mouth every 4 (four) hours as needed for up to 5 days for severe pain (pain score 7-10) or moderate pain (pain score 4-6).   vitamin B-12 (CYANOCOBALAMIN ) 500 MCG tablet Take 1 tablet (500 mcg total) by mouth daily.   No facility-administered encounter medications on file as of 01/25/2025.    Diagnostic Tests Reviewed/Disposition:     Latest Ref Rng & Units 01/10/2025    4:50 AM 01/09/2025    6:19 AM 01/07/2025    5:44 AM  CBC  WBC 4.0 - 10.5 K/uL 4.5  5.0  9.4   Hemoglobin 12.0 - 15.0 g/dL 88.4  88.4  88.7   Hematocrit 36.0 - 46.0 % 34.9  34.6  34.5   Platelets 150 - 400 K/uL 286  289  263    CMP     Component Value Date/Time   NA 140 01/10/2025 0450   NA 140 05/18/2024 1511   NA 139 02/14/2014 1633   K 3.3 (L) 01/10/2025 0450   K 4.0 02/14/2014 1633   CL 100 01/10/2025 0450   CL 107 02/14/2014 1633   CO2 28 01/10/2025 0450   CO2 26 02/14/2014 1633   GLUCOSE 78 01/10/2025 0450   GLUCOSE 79 02/14/2014 1633   BUN 8 01/10/2025 0450   BUN 14 05/18/2024 1511   BUN 16 02/14/2014 1633   CREATININE 0.76 01/10/2025 0450   CREATININE 1.15 02/14/2014 1633   CALCIUM  8.4 (L) 01/10/2025 0450   CALCIUM  8.7 02/14/2014 1633   PROT 6.5 01/05/2025 0315   PROT 6.2 05/18/2024 1511   PROT 7.6 02/14/2014 1633   ALBUMIN 4.0 01/05/2025 0315   ALBUMIN 4.3 05/18/2024 1511   ALBUMIN 4.1 02/14/2014 1633   AST 18 01/05/2025 0315   AST 19 02/14/2014 1633   ALT 15 01/05/2025 0315   ALT 29 02/14/2014 1633   ALKPHOS 49 01/05/2025 0315   ALKPHOS 45 02/14/2014 1633   BILITOT 0.7 01/05/2025 0315   BILITOT 0.4 05/18/2024 1511   BILITOT 0.3 02/14/2014 1633   EGFR 44 (L) 05/18/2024 1511   GFRNONAA >60 01/10/2025 0450   GFRNONAA 55 (L) 02/14/2014 1633    Consults: neurosurgery  Discharge Instructions: Follow-up with primary care and neurosurgery  Disease/illness Education: Reviewed at length with patient  Home Health/Community Services Discussions/Referrals: none, refuses at present  Establishment or re-establishment of referral orders for community resources: none  Discussion with other health care providers: Reviewed all recent notes with patient  Assessment and Support of treatment regimen adherence: Reviewed at length with patient and daughter  Appointments Coordinated with:  Reviewed at length with patient and daughter  Education for self-management, independent living, and ADLs:  Reviewed at length with patient and daughterReviewed at length with patient and daughter  HYPERLIPIDEMIA Continues on Rosuvastatin  20 MG.   Hyperlipidemia status: good compliance Satisfied with current treatment?  yes Side effects:  no Medication compliance: good compliance Supplements: none Aspirin:  no The 10-year ASCVD  risk score (Arnett DK, et al., 2019) is: 2.7%   Values used to calculate the score:     Age: 51 years     Clinically relevant sex: Female     Is Non-Hispanic African American: No     Diabetic: No     Tobacco smoker: No     Systolic Blood Pressure: 111 mmHg     Is BP treated: No     HDL Cholesterol: 71 mg/dL     Total Cholesterol: 168 mg/dL Chest pain:  no Coronary artery disease:  no Family history CAD:  yes Family history early CAD:  no    HYPOTHYROID Takes Levothyroxine  125 MCG daily. Thyroid  control status:stable Satisfied with current treatment? yes Medication side effects: no Medication compliance: good compliance Etiology of hypothyroidism: Hashimoto's Recent dose adjustment:no Fatigue: yes Cold intolerance: no Heat intolerance: no Weight gain: no Weight loss: no Constipation: yes Diarrhea/loose stools: no Palpitations: no Lower extremity edema: no Anxiety/depressed mood: no    ASTHMA AND ALLERGIES Uses Breztri  and Albuterol  + nebulizer treatments as needed. Continues Singulair  and Zyrtec  + Flonase .  Continues her Omeprazole  daily for GERD and hiatal hernia.   History: Saw Dr. Darlean with pulmonary in past -- suspected to have asthmatic bronchitis and chronic airway cough syndrome.  Visit 08/06/21 with pulmonary and she was referred to ENT who she had initial visit with, Dr. Brien, on 08/19/21 -- treated with Bactrim  and Prednisone . Returned to ENT on 10/08/21 and no changes made.  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 COPD  status: stable Satisfied with current treatment?: yes Oxygen use: no Dyspnea frequency: no Cough frequency: no Rescue inhaler frequency: recently due to exacerbation Limitation of activity: no Productive cough: none Last Spirometry: with pulmonary Pneumovax: Up to Date Influenza: Up to Date    CHRONIC KIDNEY DISEASE (Stage 3a) CKD status: stable Medications renally dose: yes Previous renal evaluation: no Pneumovax:  Up to Date Influenza Vaccine:  Up to Date    DEPRESSION Taking Wellbutrin , Prozac , and Trazodone . Mood status: stable Satisfied with current treatment?: yes Symptom severity: moderate  Duration of current treatment : chronic Side effects: no Medication compliance: good compliance Psychotherapy/counseling: none Previous psychiatric medications: none Depressed mood: no Anxious mood: irritable mood Anhedonia: no Significant weight loss or gain: no Insomnia: none Fatigue: yes  Feelings of worthlessness or guilt: no Impaired concentration/indecisiveness: no Suicidal ideations: no Hopelessness: no Crying spells: yes    01/25/2025   10:54 AM 05/18/2024    3:11 PM 01/04/2024    3:22 PM 11/09/2023    4:05 PM 01/05/2023    3:44 PM  Depression screen PHQ 2/9  Decreased Interest 1 3 2 3 2   Down, Depressed, Hopeless 2 2 1 3 1   PHQ - 2 Score 3 5 3 6 3   Altered sleeping 1 1 0 2 2  Tired, decreased energy 2 1 2 2 3   Change in appetite 1 1 0 0 1  Feeling bad or failure about yourself  1 1 1 2 1   Trouble concentrating 0 0 0 0 0  Moving slowly or fidgety/restless 0 1 0 0 0  Suicidal thoughts 0 0 0 0 0  PHQ-9 Score 8 10  6  12  10    Difficult doing work/chores Somewhat difficult Somewhat difficult Somewhat difficult Extremely dIfficult Somewhat difficult     Data saved with a previous flowsheet row definition       01/25/2025   10:54 AM 05/18/2024    3:11 PM 01/04/2024  3:23 PM 11/09/2023    4:05 PM  GAD 7 : Generalized Anxiety Score  Nervous, Anxious, on Edge 1  0  1  1   Control/stop worrying 3 1  1  2    Worry too much - different things 3 1  1  3    Trouble relaxing 2 1  0  2   Restless 0 0  0  0   Easily annoyed or irritable 1 1  0  2   Afraid - awful might happen 0 0  0  1   Total GAD 7 Score 10 4 3 11   Anxiety Difficulty Somewhat difficult Somewhat difficult Somewhat difficult Extremely difficult     Data saved with a previous flowsheet row definition      Relevant past medical, surgical, family and social history reviewed and updated as indicated. Interim medical history since our last visit reviewed. Allergies and medications reviewed and updated.  Review of Systems  Constitutional:  Negative for activity change, appetite change, diaphoresis, fatigue and fever.  Respiratory:  Negative for cough, chest tightness, shortness of breath and wheezing.   Cardiovascular:  Negative for chest pain, palpitations and leg swelling.  Gastrointestinal: Negative.   Musculoskeletal:  Positive for back pain.  Neurological: Negative.   Psychiatric/Behavioral: Negative.      Per HPI unless specifically indicated above     Objective:    BP 111/78   Pulse 85   Temp 98.2 F (36.8 C) (Oral)   Ht 5' 6.8 (1.697 m)   Wt 192 lb 3.2 oz (87.2 kg)   LMP  (LMP Unknown)   SpO2 94%   BMI 30.28 kg/m   Wt Readings from Last 3 Encounters:  01/25/25 192 lb 3.2 oz (87.2 kg)  01/04/25 210 lb (95.3 kg)  05/18/24 206 lb 12.8 oz (93.8 kg)    Physical Exam Vitals and nursing note reviewed.  Constitutional:      General: She is awake. She is not in acute distress.    Appearance: She is well-developed and well-groomed. She is obese. She is not ill-appearing or toxic-appearing.  HENT:     Head: Normocephalic.     Right Ear: Hearing and external ear normal.     Left Ear: Hearing and external ear normal.  Eyes:     General: Lids are normal.        Right eye: No discharge.        Left eye: No discharge.     Conjunctiva/sclera: Conjunctivae normal.      Pupils: Pupils are equal, round, and reactive to light.  Neck:     Thyroid : No thyromegaly.     Vascular: No carotid bruit.  Cardiovascular:     Rate and Rhythm: Normal rate and regular rhythm.     Heart sounds: Normal heart sounds. No murmur heard.    No gallop.  Pulmonary:     Effort: Pulmonary effort is normal. No accessory muscle usage or respiratory distress.     Breath sounds: Normal breath sounds. No decreased breath sounds, wheezing or rales.  Abdominal:     General: Bowel sounds are normal. There is no distension.     Palpations: Abdomen is soft.     Tenderness: There is no abdominal tenderness.  Musculoskeletal:     Cervical back: Normal range of motion and neck supple.     Right lower leg: No edema.     Left lower leg: No edema.     Comments: Wearing back brace  Lymphadenopathy:  Cervical: No cervical adenopathy.  Skin:    General: Skin is warm and dry.  Neurological:     Mental Status: She is alert and oriented to Richard, place, and time.     Deep Tendon Reflexes: Reflexes are normal and symmetric.     Reflex Scores:      Brachioradialis reflexes are 2+ on the right side and 2+ on the left side.      Patellar reflexes are 2+ on the right side and 2+ on the left side. Psychiatric:        Attention and Perception: Attention normal.        Mood and Affect: Mood normal.        Speech: Speech normal.        Behavior: Behavior normal. Behavior is cooperative.        Thought Content: Thought content normal.    Results for orders placed or performed during the hospital encounter of 01/05/25  CBC with Differential/Platelet   Collection Time: 01/05/25  3:15 AM  Result Value Ref Range   WBC 22.0 (H) 4.0 - 10.5 K/uL   RBC 4.58 3.87 - 5.11 MIL/uL   Hemoglobin 14.0 12.0 - 15.0 g/dL   HCT 56.3 63.9 - 53.9 %   MCV 95.2 80.0 - 100.0 fL   MCH 30.6 26.0 - 34.0 pg   MCHC 32.1 30.0 - 36.0 g/dL   RDW 85.7 88.4 - 84.4 %   Platelets 305 150 - 400 K/uL   nRBC 0.0 0.0 - 0.2 %    Neutrophils Relative % 91 %   Neutro Abs 19.9 (H) 1.7 - 7.7 K/uL   Lymphocytes Relative 3 %   Lymphs Abs 0.7 0.7 - 4.0 K/uL   Monocytes Relative 5 %   Monocytes Absolute 1.1 (H) 0.1 - 1.0 K/uL   Eosinophils Relative 0 %   Eosinophils Absolute 0.0 0.0 - 0.5 K/uL   Basophils Relative 0 %   Basophils Absolute 0.0 0.0 - 0.1 K/uL   Immature Granulocytes 1 %   Abs Immature Granulocytes 0.20 (H) 0.00 - 0.07 K/uL  Comprehensive metabolic panel   Collection Time: 01/05/25  3:15 AM  Result Value Ref Range   Sodium 133 (L) 135 - 145 mmol/L   Potassium 3.7 3.5 - 5.1 mmol/L   Chloride 99 98 - 111 mmol/L   CO2 20 (L) 22 - 32 mmol/L   Glucose, Bld 88 70 - 99 mg/dL   BUN 16 8 - 23 mg/dL   Creatinine, Ser 9.11 0.44 - 1.00 mg/dL   Calcium  8.4 (L) 8.9 - 10.3 mg/dL   Total Protein 6.5 6.5 - 8.1 g/dL   Albumin 4.0 3.5 - 5.0 g/dL   AST 18 15 - 41 U/L   ALT 15 0 - 44 U/L   Alkaline Phosphatase 49 38 - 126 U/L   Total Bilirubin 0.7 0.0 - 1.2 mg/dL   GFR, Estimated >39 >39 mL/min   Anion gap 14 5 - 15  Lipase, blood   Collection Time: 01/05/25  3:15 AM  Result Value Ref Range   Lipase 12 11 - 51 U/L  Resp panel by RT-PCR (RSV, Flu A&B, Covid) Anterior Nasal Swab   Collection Time: 01/05/25  5:24 AM   Specimen: Anterior Nasal Swab  Result Value Ref Range   SARS Coronavirus 2 by RT PCR NEGATIVE NEGATIVE   Influenza A by PCR NEGATIVE NEGATIVE   Influenza B by PCR NEGATIVE NEGATIVE   Resp Syncytial Virus by PCR NEGATIVE NEGATIVE  Lactic acid, plasma   Collection Time: 01/05/25  5:24 AM  Result Value Ref Range   Lactic Acid, Venous 0.8 0.5 - 1.9 mmol/L  Procalcitonin   Collection Time: 01/05/25  5:24 AM  Result Value Ref Range   Procalcitonin 0.52 ng/mL  Blood culture (routine x 2)   Collection Time: 01/05/25  5:25 AM   Specimen: BLOOD  Result Value Ref Range   Specimen Description BLOOD LEFT ANTECUBITAL    Special Requests      BOTTLES DRAWN AEROBIC AND ANAEROBIC Blood Culture adequate  volume   Culture      NO GROWTH 5 DAYS Performed at [facility], [address], [place], [place] [address]    Report Status 01/10/2025 FINAL   Blood culture (routine x 2)   Collection Time: 01/05/25  5:25 AM   Specimen: BLOOD  Result Value Ref Range   Specimen Description BLOOD RIGHT ANTECUBITAL    Special Requests      BOTTLES DRAWN AEROBIC AND ANAEROBIC Blood Culture adequate volume   Culture      NO GROWTH 5 DAYS Performed at [facility], [address], [place], [place] [address]    Report Status 01/10/2025 FINAL   Urine Culture   Collection Time: 01/05/25  5:42 AM   Specimen: Urine, Clean Catch  Result Value Ref Range   Specimen Description      URINE, CLEAN CATCH Performed at [facility], [address] [place], [place] [address]    Special Requests      NONE Performed at [facility], [address], [place], [place] [address]    Culture (A)     <10,000 COLONIES/mL INSIGNIFICANT GROWTH Performed at [facility], [address], [place], [place] [address]    Report Status 01/06/2025 FINAL   Urinalysis, Routine w reflex microscopic -Urine, Clean Catch   Collection Time: 01/05/25  5:42 AM  Result Value Ref Range   Color, Urine STRAW (A) YELLOW   APPearance CLEAR CLEAR   Specific Gravity, Urine 1.045 (H) 1.005 - 1.030   pH 5.0 5.0 - 8.0   Glucose, UA NEGATIVE NEGATIVE mg/dL   Hgb urine dipstick MODERATE (A) NEGATIVE   Bilirubin Urine NEGATIVE NEGATIVE   Ketones, ur 5 (A) NEGATIVE mg/dL   Protein, ur NEGATIVE NEGATIVE mg/dL   Nitrite NEGATIVE NEGATIVE   Leukocytes,Ua NEGATIVE NEGATIVE  Urinalysis, Microscopic (reflex)   Collection Time: 01/05/25  5:42 AM  Result Value Ref Range   RBC / HPF 0-5 0 - 5 RBC/hpf   WBC, UA 0-5 0 - 5 WBC/hpf   Bacteria, UA NONE SEEN NONE SEEN   Squamous Epithelial / HPF 0-5 0 - 5 /HPF  Basic metabolic panel with GFR   Collection Time: 01/06/25  5:51 AM  Result Value Ref Range    Sodium 134 (L) 135 - 145 mmol/L   Potassium 3.8 3.5 - 5.1 mmol/L   Chloride 101 98 - 111 mmol/L   CO2 25 22 - 32 mmol/L   Glucose, Bld 97 70 - 99 mg/dL   BUN 13 8 - 23 mg/dL   Creatinine, Ser 9.14 0.44 - 1.00 mg/dL   Calcium  8.3 (L) 8.9 - 10.3 mg/dL   GFR, Estimated >39 >39 mL/min   Anion gap 9 5 - 15  CBC with Differential/Platelet   Collection Time: 01/06/25 10:22 AM  Result Value Ref Range   WBC 14.4 (H) 4.0 - 10.5 K/uL   RBC 3.92 3.87 - 5.11 MIL/uL  Hemoglobin 12.0 12.0 - 15.0 g/dL   HCT 63.4 63.9 - 53.9 %   MCV 93.1 80.0 - 100.0 fL   MCH 30.6 26.0 - 34.0 pg   MCHC 32.9 30.0 - 36.0 g/dL   RDW 85.4 88.4 - 84.4 %   Platelets 258 150 - 400 K/uL   nRBC 0.0 0.0 - 0.2 %   Neutrophils Relative % 86 %   Neutro Abs 12.3 (H) 1.7 - 7.7 K/uL   Lymphocytes Relative 6 %   Lymphs Abs 0.9 0.7 - 4.0 K/uL   Monocytes Relative 6 %   Monocytes Absolute 0.9 0.1 - 1.0 K/uL   Eosinophils Relative 1 %   Eosinophils Absolute 0.1 0.0 - 0.5 K/uL   Basophils Relative 0 %   Basophils Absolute 0.0 0.0 - 0.1 K/uL   Immature Granulocytes 1 %   Abs Immature Granulocytes 0.07 0.00 - 0.07 K/uL  CBC with Differential/Platelet   Collection Time: 01/07/25  5:44 AM  Result Value Ref Range   WBC 9.4 4.0 - 10.5 K/uL   RBC 3.74 (L) 3.87 - 5.11 MIL/uL   Hemoglobin 11.2 (L) 12.0 - 15.0 g/dL   HCT 65.4 (L) 63.9 - 53.9 %   MCV 92.2 80.0 - 100.0 fL   MCH 29.9 26.0 - 34.0 pg   MCHC 32.5 30.0 - 36.0 g/dL   RDW 85.9 88.4 - 84.4 %   Platelets 263 150 - 400 K/uL   nRBC 0.0 0.0 - 0.2 %   Neutrophils Relative % 81 %   Neutro Abs 7.6 1.7 - 7.7 K/uL   Lymphocytes Relative 9 %   Lymphs Abs 0.9 0.7 - 4.0 K/uL   Monocytes Relative 8 %   Monocytes Absolute 0.7 0.1 - 1.0 K/uL   Eosinophils Relative 2 %   Eosinophils Absolute 0.2 0.0 - 0.5 K/uL   Basophils Relative 0 %   Basophils Absolute 0.0 0.0 - 0.1 K/uL   Immature Granulocytes 0 %   Abs Immature Granulocytes 0.03 0.00 - 0.07 K/uL  Basic metabolic panel    Collection Time: 01/07/25  5:44 AM  Result Value Ref Range   Sodium 139 135 - 145 mmol/L   Potassium 3.4 (L) 3.5 - 5.1 mmol/L   Chloride 104 98 - 111 mmol/L   CO2 26 22 - 32 mmol/L   Glucose, Bld 89 70 - 99 mg/dL   BUN 10 8 - 23 mg/dL   Creatinine, Ser 9.20 0.44 - 1.00 mg/dL   Calcium  8.3 (L) 8.9 - 10.3 mg/dL   GFR, Estimated >39 >39 mL/min   Anion gap 8 5 - 15  Glucose, capillary   Collection Time: 01/08/25 12:10 PM  Result Value Ref Range   Glucose-Capillary 86 70 - 99 mg/dL  Glucose, capillary   Collection Time: 01/08/25  4:49 PM  Result Value Ref Range   Glucose-Capillary 83 70 - 99 mg/dL  CBC with Differential/Platelet   Collection Time: 01/09/25  6:19 AM  Result Value Ref Range   WBC 5.0 4.0 - 10.5 K/uL   RBC 3.78 (L) 3.87 - 5.11 MIL/uL   Hemoglobin 11.5 (L) 12.0 - 15.0 g/dL   HCT 65.3 (L) 63.9 - 53.9 %   MCV 91.5 80.0 - 100.0 fL   MCH 30.4 26.0 - 34.0 pg   MCHC 33.2 30.0 - 36.0 g/dL   RDW 86.4 88.4 - 84.4 %   Platelets 289 150 - 400 K/uL   nRBC 0.0 0.0 - 0.2 %   Neutrophils Relative %  74 %   Neutro Abs 3.7 1.7 - 7.7 K/uL   Lymphocytes Relative 12 %   Lymphs Abs 0.6 (L) 0.7 - 4.0 K/uL   Monocytes Relative 10 %   Monocytes Absolute 0.5 0.1 - 1.0 K/uL   Eosinophils Relative 3 %   Eosinophils Absolute 0.2 0.0 - 0.5 K/uL   Basophils Relative 1 %   Basophils Absolute 0.0 0.0 - 0.1 K/uL   Immature Granulocytes 0 %   Abs Immature Granulocytes 0.02 0.00 - 0.07 K/uL  Basic metabolic panel   Collection Time: 01/09/25  6:19 AM  Result Value Ref Range   Sodium 141 135 - 145 mmol/L   Potassium 3.5 3.5 - 5.1 mmol/L   Chloride 102 98 - 111 mmol/L   CO2 26 22 - 32 mmol/L   Glucose, Bld 79 70 - 99 mg/dL   BUN 9 8 - 23 mg/dL   Creatinine, Ser 9.18 0.44 - 1.00 mg/dL   Calcium  8.5 (L) 8.9 - 10.3 mg/dL   GFR, Estimated >39 >39 mL/min   Anion gap 13 5 - 15  CBC with Differential/Platelet   Collection Time: 01/10/25  4:50 AM  Result Value Ref Range   WBC 4.5 4.0 - 10.5  K/uL   RBC 3.84 (L) 3.87 - 5.11 MIL/uL   Hemoglobin 11.5 (L) 12.0 - 15.0 g/dL   HCT 65.0 (L) 63.9 - 53.9 %   MCV 90.9 80.0 - 100.0 fL   MCH 29.9 26.0 - 34.0 pg   MCHC 33.0 30.0 - 36.0 g/dL   RDW 86.1 88.4 - 84.4 %   Platelets 286 150 - 400 K/uL   nRBC 0.0 0.0 - 0.2 %   Neutrophils Relative % 67 %   Neutro Abs 3.0 1.7 - 7.7 K/uL   Lymphocytes Relative 19 %   Lymphs Abs 0.9 0.7 - 4.0 K/uL   Monocytes Relative 11 %   Monocytes Absolute 0.5 0.1 - 1.0 K/uL   Eosinophils Relative 3 %   Eosinophils Absolute 0.1 0.0 - 0.5 K/uL   Basophils Relative 0 %   Basophils Absolute 0.0 0.0 - 0.1 K/uL   Immature Granulocytes 0 %   Abs Immature Granulocytes 0.01 0.00 - 0.07 K/uL  Basic metabolic panel with GFR   Collection Time: 01/10/25  4:50 AM  Result Value Ref Range   Sodium 140 135 - 145 mmol/L   Potassium 3.3 (L) 3.5 - 5.1 mmol/L   Chloride 100 98 - 111 mmol/L   CO2 28 22 - 32 mmol/L   Glucose, Bld 78 70 - 99 mg/dL   BUN 8 8 - 23 mg/dL   Creatinine, Ser 9.23 0.44 - 1.00 mg/dL   Calcium  8.4 (L) 8.9 - 10.3 mg/dL   GFR, Estimated >39 >39 mL/min   Anion gap 12 5 - 15      Assessment & Plan:   Problem List Items Addressed This Visit       Respiratory   Asthmatic bronchitis , chronic (HCC)   Chronic, ongoing. Improved symptoms with Breztri  on board, will maintain this and continue to work with PharmD on cost assistance.  Continue Albuterol  PRN and nebs at home.  Would benefit return to pulmonary, but refuses at this time.  She does report rinsing mouth well after Breztri  and no swallowing of rinse water.      Relevant Medications   budesonide -glycopyrrolate-formoterol  (BREZTRI  AEROSPHERE) 160-9-4.8 MCG/ACT AERO inhaler     Digestive   GERD (gastroesophageal reflux disease)   Chronic, ongoing, using  Pepcid  & Omeprazole .  Continue current medication regimen and adjust as needed.  Mag level today.  Risks of PPI use were discussed with patient including bone loss, C. Diff diarrhea,  pneumonia, infections, CKD, electrolyte abnormalities.  Verbalizes understanding and chooses to continue the medication.       Relevant Orders   Magnesium    Acute colitis   Acute and currently improved. Denies any further abdominal pain and is passing bowels well. Continue Colace and Miralax  at home. Labs today.        Endocrine   Hashimoto's thyroiditis   Chronic, ongoing.  Continue current medication regimen and adjust as needed based on labs. Recheck TSH, Free T4 today.       Relevant Orders   TSH   T4, free     Musculoskeletal and Integument   Osteopenia   Noted on past imaging. Recommend taking Vitamin D3 2000 units daily and will obtain DEXA at age 39.      Relevant Orders   VITAMIN D  25 Hydroxy (Vit-D Deficiency, Fractures)   Compression fracture of body of thoracic vertebra (HCC) - Primary   Acute, diagnosed when in hospital recently. uperior aspect of the T12 vertebral body + 4 mm retropulsion and prominent sacral Tarlov cysts. Will place referral to neurosurgery for further recommendations. Continue to wear brace. Short term refill on Norco sent for pain.      Relevant Orders   Ambulatory referral to Neurosurgery     Genitourinary   Chronic kidney disease, stage 3a (HCC)   Ongoing, stable.  Recheck labs today.  Recommend good hydration at home.  Avoid Ibuprofen  products.      Relevant Orders   Basic metabolic panel with GFR     Other   Mixed hyperlipidemia   Chronic, ongoing.  Continue current medication regimen and adjust as needed.  Lipid panel today.      Relevant Orders   Lipid Panel w/o Chol/HDL Ratio   Depression   Chronic, stable.  Denies SI/HI.  At this time will continue Wellbutrin  XL 300 MG daily and maintain Prozac  + Trazodone  as needed for sleep.  Could consider in future discontinuation of Prozac  and change to Duloxetine which may also benefit her chronic pain, along with mood.       Relevant Medications   buPROPion  (WELLBUTRIN  XL) 300  MG 24 hr tablet   B12 deficiency   Chronic, ongoing.  Continue on supplement and adjust as needed. Check level today.      Relevant Orders   Ferritin   Iron   CBC with Differential/Platelet   Vitamin B12     Follow up plan: Return in about 8 days (around 02/02/2025) for BACK PAIN.      "

## 2025-01-25 NOTE — Assessment & Plan Note (Signed)
 Ongoing, stable.  Recheck labs today.  Recommend good hydration at home.  Avoid Ibuprofen products.

## 2025-01-25 NOTE — Assessment & Plan Note (Signed)
Chronic, ongoing.  Continue on supplement and adjust as needed. Check level today. 

## 2025-01-25 NOTE — Assessment & Plan Note (Signed)
 Acute, diagnosed when in hospital recently. uperior aspect of the T12 vertebral body + 4 mm retropulsion and prominent sacral Tarlov cysts. Will place referral to neurosurgery for further recommendations. Continue to wear brace. Short term refill on Norco sent for pain.

## 2025-01-25 NOTE — Assessment & Plan Note (Signed)
Chronic, ongoing.  Continue current medication regimen and adjust as needed based on labs. Recheck TSH, Free T4 today. ? ?

## 2025-01-25 NOTE — Assessment & Plan Note (Signed)
 Acute and currently improved. Denies any further abdominal pain and is passing bowels well. Continue Colace and Miralax  at home. Labs today.

## 2025-01-25 NOTE — Assessment & Plan Note (Signed)
 Chronic, ongoing, using Pepcid & Omeprazole.  Continue current medication regimen and adjust as needed.  Mag level today.  Risks of PPI use were discussed with patient including bone loss, C. Diff diarrhea, pneumonia, infections, CKD, electrolyte abnormalities.  Verbalizes understanding and chooses to continue the medication.

## 2025-01-25 NOTE — Assessment & Plan Note (Signed)
 Chronic, ongoing.  Continue current medication regimen and adjust as needed. Lipid panel today.

## 2025-01-25 NOTE — Assessment & Plan Note (Signed)
 Chronic, stable.  Denies SI/HI.  At this time will continue Wellbutrin  XL 300 MG daily and maintain Prozac  + Trazodone  as needed for sleep.  Could consider in future discontinuation of Prozac  and change to Duloxetine which may also benefit her chronic pain, along with mood.

## 2025-01-25 NOTE — Assessment & Plan Note (Signed)
 Chronic, ongoing. Improved symptoms with Breztri  on board, will maintain this and continue to work with PharmD on cost assistance.  Continue Albuterol  PRN and nebs at home.  Would benefit return to pulmonary, but refuses at this time.  She does report rinsing mouth well after Breztri  and no swallowing of rinse water.

## 2025-01-26 ENCOUNTER — Ambulatory Visit: Payer: Self-pay | Admitting: Nurse Practitioner

## 2025-01-26 LAB — CBC WITH DIFFERENTIAL/PLATELET
Basophils Absolute: 0 10*3/uL (ref 0.0–0.2)
Basos: 1 %
EOS (ABSOLUTE): 0.2 10*3/uL (ref 0.0–0.4)
Eos: 5 %
Hematocrit: 40.5 % (ref 34.0–46.6)
Hemoglobin: 13.3 g/dL (ref 11.1–15.9)
Immature Grans (Abs): 0 10*3/uL (ref 0.0–0.1)
Immature Granulocytes: 0 %
Lymphocytes Absolute: 0.8 10*3/uL (ref 0.7–3.1)
Lymphs: 18 %
MCH: 30.7 pg (ref 26.6–33.0)
MCHC: 32.8 g/dL (ref 31.5–35.7)
MCV: 94 fL (ref 79–97)
Monocytes Absolute: 0.3 10*3/uL (ref 0.1–0.9)
Monocytes: 7 %
Neutrophils Absolute: 3.1 10*3/uL (ref 1.4–7.0)
Neutrophils: 69 %
Platelets: 389 10*3/uL (ref 150–450)
RBC: 4.33 x10E6/uL (ref 3.77–5.28)
RDW: 14 % (ref 11.7–15.4)
WBC: 4.5 10*3/uL (ref 3.4–10.8)

## 2025-01-26 LAB — T4, FREE: Free T4: 1.61 ng/dL (ref 0.82–1.77)

## 2025-01-26 LAB — BASIC METABOLIC PANEL WITH GFR
BUN/Creatinine Ratio: 19 (ref 12–28)
BUN: 18 mg/dL (ref 8–27)
CO2: 21 mmol/L (ref 20–29)
Calcium: 9.1 mg/dL (ref 8.7–10.3)
Chloride: 103 mmol/L (ref 96–106)
Creatinine, Ser: 0.96 mg/dL (ref 0.57–1.00)
Glucose: 81 mg/dL (ref 70–99)
Potassium: 4.5 mmol/L (ref 3.5–5.2)
Sodium: 142 mmol/L (ref 134–144)
eGFR: 66 mL/min/{1.73_m2}

## 2025-01-26 LAB — VITAMIN D 25 HYDROXY (VIT D DEFICIENCY, FRACTURES): Vit D, 25-Hydroxy: 121 ng/mL — ABNORMAL HIGH (ref 30.0–100.0)

## 2025-01-26 LAB — VITAMIN B12: Vitamin B-12: 471 pg/mL (ref 232–1245)

## 2025-01-26 LAB — FERRITIN: Ferritin: 47 ng/mL (ref 15–150)

## 2025-01-26 LAB — LIPID PANEL W/O CHOL/HDL RATIO
Cholesterol, Total: 114 mg/dL (ref 100–199)
HDL: 41 mg/dL
LDL Chol Calc (NIH): 55 mg/dL (ref 0–99)
Triglycerides: 97 mg/dL (ref 0–149)
VLDL Cholesterol Cal: 18 mg/dL (ref 5–40)

## 2025-01-26 LAB — IRON: Iron: 77 ug/dL (ref 27–139)

## 2025-01-26 LAB — TSH: TSH: 6.11 u[IU]/mL — ABNORMAL HIGH (ref 0.450–4.500)

## 2025-01-26 LAB — MAGNESIUM: Magnesium: 2 mg/dL (ref 1.6–2.3)

## 2025-01-26 NOTE — Progress Notes (Signed)
 Contacted via MyChart, although please call as I have a question and need to determine whether to change thyroid  medication dosing.  Good morning Audrey Richard, your labs have returned: - Thyroid  labs show some elevation in TSH above goal, but Free T4 stable. Are you taking your Levothyroxine  every morning, 30 minutes before eating and other medications? Please let me know. If not then I do request you do this and we can recheck next visit. If you are taking this way then I would want to adjust current dose. - Vitamin D  level too high, please reduce taking Vitamin D  to 2000 units every other day and stop weekly dosing. - Remainder of labs look great. Any questions? Keep being amazing!!  Thank you for allowing me to participate in your care.  I appreciate you. Kindest regards, Calden Dorsey

## 2025-02-06 ENCOUNTER — Ambulatory Visit: Admitting: Nurse Practitioner

## 2025-03-27 ENCOUNTER — Ambulatory Visit: Admitting: Nurse Practitioner
